# Patient Record
Sex: Female | Born: 1960
Health system: Southern US, Community
[De-identification: ages and names within clinical notes are randomized; demographics above are authoritative.]

## PROBLEM LIST (undated history)

## (undated) DIAGNOSIS — Z86711 Personal history of pulmonary embolism: Secondary | ICD-10-CM

## (undated) DIAGNOSIS — B009 Herpesviral infection, unspecified: Secondary | ICD-10-CM

## (undated) DIAGNOSIS — M810 Age-related osteoporosis without current pathological fracture: Secondary | ICD-10-CM

## (undated) DIAGNOSIS — Z8679 Personal history of other diseases of the circulatory system: Secondary | ICD-10-CM

## (undated) DIAGNOSIS — F1190 Opioid use, unspecified, uncomplicated: Secondary | ICD-10-CM

## (undated) DIAGNOSIS — I872 Venous insufficiency (chronic) (peripheral): Secondary | ICD-10-CM

## (undated) DIAGNOSIS — R5383 Other fatigue: Secondary | ICD-10-CM

## (undated) DIAGNOSIS — F32A Depression, unspecified: Secondary | ICD-10-CM

## (undated) DIAGNOSIS — E559 Vitamin D deficiency, unspecified: Secondary | ICD-10-CM

## (undated) DIAGNOSIS — I10 Essential (primary) hypertension: Secondary | ICD-10-CM

## (undated) DIAGNOSIS — F329 Major depressive disorder, single episode, unspecified: Secondary | ICD-10-CM

## (undated) DIAGNOSIS — G8929 Other chronic pain: Secondary | ICD-10-CM

## (undated) DIAGNOSIS — B001 Herpesviral vesicular dermatitis: Secondary | ICD-10-CM

## (undated) DIAGNOSIS — E876 Hypokalemia: Secondary | ICD-10-CM

## (undated) DIAGNOSIS — E063 Autoimmune thyroiditis: Secondary | ICD-10-CM

## (undated) DIAGNOSIS — F419 Anxiety disorder, unspecified: Secondary | ICD-10-CM

## (undated) DIAGNOSIS — G4733 Obstructive sleep apnea (adult) (pediatric): Secondary | ICD-10-CM

## (undated) DIAGNOSIS — G709 Myoneural disorder, unspecified: Secondary | ICD-10-CM

## (undated) DIAGNOSIS — L97919 Non-pressure chronic ulcer of unspecified part of right lower leg with unspecified severity: Secondary | ICD-10-CM

## (undated) DIAGNOSIS — Z7901 Long term (current) use of anticoagulants: Secondary | ICD-10-CM

## (undated) DIAGNOSIS — M797 Fibromyalgia: Secondary | ICD-10-CM

## (undated) DIAGNOSIS — Z9989 Dependence on other enabling machines and devices: Secondary | ICD-10-CM

## (undated) DIAGNOSIS — M329 Systemic lupus erythematosus, unspecified: Secondary | ICD-10-CM

## (undated) DIAGNOSIS — K5790 Diverticulosis of intestine, part unspecified, without perforation or abscess without bleeding: Secondary | ICD-10-CM

## (undated) DIAGNOSIS — L97929 Non-pressure chronic ulcer of unspecified part of left lower leg with unspecified severity: Secondary | ICD-10-CM

## (undated) HISTORY — DX: Systemic lupus erythematosus, unspecified: M32.9

## (undated) HISTORY — DX: Myoneural disorder, unspecified: G70.9

## (undated) HISTORY — DX: Other chronic pain: G89.29

## (undated) HISTORY — DX: Non-pressure chronic ulcer of unspecified part of left lower leg with unspecified severity: L97.929

## (undated) HISTORY — DX: Long term (current) use of anticoagulants: Z79.01

## (undated) HISTORY — DX: Herpesviral infection, unspecified: B00.9

## (undated) HISTORY — DX: Herpesviral vesicular dermatitis: B00.1

## (undated) HISTORY — DX: Obstructive sleep apnea (adult) (pediatric): G47.33

## (undated) HISTORY — DX: Age-related osteoporosis without current pathological fracture: M81.0

## (undated) HISTORY — DX: Depression, unspecified: F32.A

## (undated) HISTORY — PX: CHOLECYSTECTOMY: SHX55

## (undated) HISTORY — DX: Opioid use, unspecified, uncomplicated: F11.90

## (undated) HISTORY — DX: Dependence on other enabling machines and devices: Z99.89

## (undated) HISTORY — DX: Non-pressure chronic ulcer of unspecified part of right lower leg with unspecified severity: L97.919

## (undated) HISTORY — DX: Anxiety disorder, unspecified: F41.9

## (undated) HISTORY — DX: Fibromyalgia: M79.7

## (undated) HISTORY — DX: Diverticulosis of intestine, part unspecified, without perforation or abscess without bleeding: K57.90

## (undated) HISTORY — DX: Personal history of pulmonary embolism: Z86.711

## (undated) HISTORY — DX: Other fatigue: R53.83

## (undated) HISTORY — PX: LAPAROSCOPIC GASTRIC SLEEVE RESECTION: SHX5895

## (undated) HISTORY — DX: Venous insufficiency (chronic) (peripheral): I87.2

## (undated) HISTORY — DX: Hypokalemia: E87.6

## (undated) HISTORY — DX: Personal history of other diseases of the circulatory system: Z86.79

## (undated) HISTORY — DX: Vitamin D deficiency, unspecified: E55.9

## (undated) HISTORY — DX: Autoimmune thyroiditis: E06.3

## (undated) HISTORY — DX: Essential (primary) hypertension: I10

## (undated) HISTORY — DX: Major depressive disorder, single episode, unspecified: F32.9

---

## 2014-02-27 DIAGNOSIS — R141 Gas pain: Secondary | ICD-10-CM | POA: Insufficient documentation

## 2014-02-27 DIAGNOSIS — R142 Eructation: Secondary | ICD-10-CM

## 2014-02-27 DIAGNOSIS — E669 Obesity, unspecified: Secondary | ICD-10-CM | POA: Insufficient documentation

## 2014-02-27 DIAGNOSIS — R197 Diarrhea, unspecified: Secondary | ICD-10-CM | POA: Insufficient documentation

## 2014-02-27 DIAGNOSIS — R1011 Right upper quadrant pain: Secondary | ICD-10-CM | POA: Insufficient documentation

## 2014-02-27 DIAGNOSIS — I10 Essential (primary) hypertension: Secondary | ICD-10-CM | POA: Insufficient documentation

## 2014-02-27 DIAGNOSIS — R143 Flatulence: Secondary | ICD-10-CM

## 2015-02-03 DIAGNOSIS — L0390 Cellulitis, unspecified: Secondary | ICD-10-CM | POA: Insufficient documentation

## 2015-04-10 LAB — TSH: TSH: 2.89 u[IU]/mL (ref <=5.90)

## 2015-04-10 LAB — HEMOGLOBIN A1C: Hemoglobin A1C: 5.6

## 2015-08-06 DIAGNOSIS — Z6841 Body Mass Index (BMI) 40.0 and over, adult: Secondary | ICD-10-CM

## 2015-08-06 DIAGNOSIS — I2699 Other pulmonary embolism without acute cor pulmonale: Secondary | ICD-10-CM | POA: Insufficient documentation

## 2015-08-06 DIAGNOSIS — Z7952 Long term (current) use of systemic steroids: Secondary | ICD-10-CM | POA: Insufficient documentation

## 2015-08-06 DIAGNOSIS — E663 Overweight: Secondary | ICD-10-CM | POA: Insufficient documentation

## 2015-08-06 DIAGNOSIS — I209 Angina pectoris, unspecified: Secondary | ICD-10-CM | POA: Insufficient documentation

## 2015-08-06 DIAGNOSIS — Z8701 Personal history of pneumonia (recurrent): Secondary | ICD-10-CM | POA: Insufficient documentation

## 2015-08-06 DIAGNOSIS — D849 Immunodeficiency, unspecified: Secondary | ICD-10-CM | POA: Insufficient documentation

## 2015-08-06 DIAGNOSIS — L03119 Cellulitis of unspecified part of limb: Secondary | ICD-10-CM | POA: Insufficient documentation

## 2015-08-06 DIAGNOSIS — D899 Disorder involving the immune mechanism, unspecified: Secondary | ICD-10-CM

## 2015-08-06 DIAGNOSIS — R609 Edema, unspecified: Secondary | ICD-10-CM | POA: Insufficient documentation

## 2015-08-06 DIAGNOSIS — R590 Localized enlarged lymph nodes: Secondary | ICD-10-CM | POA: Insufficient documentation

## 2015-08-06 DIAGNOSIS — I2721 Secondary pulmonary arterial hypertension: Secondary | ICD-10-CM | POA: Insufficient documentation

## 2015-08-06 DIAGNOSIS — I10 Essential (primary) hypertension: Secondary | ICD-10-CM | POA: Insufficient documentation

## 2015-08-06 DIAGNOSIS — B009 Herpesviral infection, unspecified: Secondary | ICD-10-CM | POA: Insufficient documentation

## 2015-08-06 DIAGNOSIS — D72829 Elevated white blood cell count, unspecified: Secondary | ICD-10-CM | POA: Insufficient documentation

## 2015-08-06 DIAGNOSIS — Z7901 Long term (current) use of anticoagulants: Secondary | ICD-10-CM | POA: Insufficient documentation

## 2015-08-06 DIAGNOSIS — I272 Pulmonary hypertension, unspecified: Secondary | ICD-10-CM | POA: Insufficient documentation

## 2015-08-06 DIAGNOSIS — Z79899 Other long term (current) drug therapy: Secondary | ICD-10-CM | POA: Insufficient documentation

## 2015-08-06 DIAGNOSIS — F1120 Opioid dependence, uncomplicated: Secondary | ICD-10-CM | POA: Insufficient documentation

## 2015-10-07 LAB — CBC AND DIFFERENTIAL
HCT: 41 % (ref 36–46)
HEMOGLOBIN: 13.3 g/dL (ref 12.0–16.0)
PLATELETS: 357 10*3/uL (ref 150–399)
WBC: 8.3 10*3/mL

## 2015-10-07 LAB — POCT ERYTHROCYTE SEDIMENTATION RATE, NON-AUTOMATED: Sed Rate: 58 mm

## 2015-10-07 LAB — BASIC METABOLIC PANEL
BUN: 9 mg/dL (ref 4–21)
CREATININE: 0.9 mg/dL (ref 0.5–1.1)
Glucose: 112 mg/dL
Potassium: 3.5 mmol/L (ref 3.4–5.3)
SODIUM: 142 mmol/L (ref 137–147)

## 2015-10-07 LAB — HEPATIC FUNCTION PANEL
ALK PHOS: 100 U/L (ref 25–125)
ALT: 19 U/L (ref 7–35)
AST: 22 U/L (ref 13–35)
BILIRUBIN DIRECT: 0.1 mg/dL (ref 0.01–0.4)
BILIRUBIN, TOTAL: 0.6 mg/dL

## 2016-02-04 DIAGNOSIS — Z8679 Personal history of other diseases of the circulatory system: Secondary | ICD-10-CM | POA: Diagnosis not present

## 2016-02-04 DIAGNOSIS — F1190 Opioid use, unspecified, uncomplicated: Secondary | ICD-10-CM | POA: Insufficient documentation

## 2016-02-04 DIAGNOSIS — B001 Herpesviral vesicular dermatitis: Secondary | ICD-10-CM | POA: Insufficient documentation

## 2016-02-04 DIAGNOSIS — I10 Essential (primary) hypertension: Secondary | ICD-10-CM | POA: Insufficient documentation

## 2016-02-04 DIAGNOSIS — Z86711 Personal history of pulmonary embolism: Secondary | ICD-10-CM | POA: Diagnosis not present

## 2016-02-04 DIAGNOSIS — Z8739 Personal history of other diseases of the musculoskeletal system and connective tissue: Secondary | ICD-10-CM | POA: Diagnosis not present

## 2016-02-04 DIAGNOSIS — E559 Vitamin D deficiency, unspecified: Secondary | ICD-10-CM | POA: Diagnosis not present

## 2016-02-04 DIAGNOSIS — M329 Systemic lupus erythematosus, unspecified: Secondary | ICD-10-CM

## 2016-02-04 DIAGNOSIS — R5382 Chronic fatigue, unspecified: Secondary | ICD-10-CM | POA: Diagnosis not present

## 2016-02-04 DIAGNOSIS — R7309 Other abnormal glucose: Secondary | ICD-10-CM | POA: Insufficient documentation

## 2016-02-04 DIAGNOSIS — R5383 Other fatigue: Secondary | ICD-10-CM | POA: Insufficient documentation

## 2016-02-04 DIAGNOSIS — R809 Proteinuria, unspecified: Secondary | ICD-10-CM | POA: Diagnosis not present

## 2016-02-04 DIAGNOSIS — Z7901 Long term (current) use of anticoagulants: Secondary | ICD-10-CM | POA: Insufficient documentation

## 2016-02-04 DIAGNOSIS — IMO0002 Reserved for concepts with insufficient information to code with codable children: Secondary | ICD-10-CM | POA: Insufficient documentation

## 2016-02-04 DIAGNOSIS — B009 Herpesviral infection, unspecified: Secondary | ICD-10-CM | POA: Insufficient documentation

## 2016-02-06 DIAGNOSIS — Z8739 Personal history of other diseases of the musculoskeletal system and connective tissue: Secondary | ICD-10-CM | POA: Diagnosis not present

## 2016-02-10 ENCOUNTER — Encounter (INDEPENDENT_AMBULATORY_CARE_PROVIDER_SITE_OTHER): Payer: Self-pay

## 2016-02-23 ENCOUNTER — Other Ambulatory Visit: Payer: Self-pay | Admitting: Internal Medicine

## 2016-02-23 ENCOUNTER — Ambulatory Visit
Admission: RE | Admit: 2016-02-23 | Discharge: 2016-02-23 | Disposition: A | Discharge status: Home / Self Care | Payer: Commercial Managed Care - HMO | Source: Ambulatory Visit | Attending: Internal Medicine | Admitting: Internal Medicine

## 2016-02-23 DIAGNOSIS — M85632 Other cyst of bone, left forearm: Secondary | ICD-10-CM | POA: Diagnosis not present

## 2016-02-23 DIAGNOSIS — M19042 Primary osteoarthritis, left hand: Secondary | ICD-10-CM | POA: Diagnosis not present

## 2016-02-23 DIAGNOSIS — Z8739 Personal history of other diseases of the musculoskeletal system and connective tissue: Secondary | ICD-10-CM | POA: Insufficient documentation

## 2016-02-23 DIAGNOSIS — M85631 Other cyst of bone, right forearm: Secondary | ICD-10-CM | POA: Insufficient documentation

## 2016-02-23 DIAGNOSIS — M179 Osteoarthritis of knee, unspecified: Secondary | ICD-10-CM | POA: Diagnosis not present

## 2016-02-23 DIAGNOSIS — M329 Systemic lupus erythematosus, unspecified: Principal | ICD-10-CM

## 2016-02-23 DIAGNOSIS — M19041 Primary osteoarthritis, right hand: Secondary | ICD-10-CM | POA: Diagnosis not present

## 2016-02-23 DIAGNOSIS — IMO0002 Reserved for concepts with insufficient information to code with codable children: Secondary | ICD-10-CM

## 2016-03-12 DIAGNOSIS — I10 Essential (primary) hypertension: Secondary | ICD-10-CM

## 2016-03-12 DIAGNOSIS — E559 Vitamin D deficiency, unspecified: Secondary | ICD-10-CM

## 2016-03-12 DIAGNOSIS — F32A Depression, unspecified: Secondary | ICD-10-CM | POA: Insufficient documentation

## 2016-03-12 DIAGNOSIS — Z86711 Personal history of pulmonary embolism: Secondary | ICD-10-CM | POA: Insufficient documentation

## 2016-03-12 DIAGNOSIS — F419 Anxiety disorder, unspecified: Secondary | ICD-10-CM

## 2016-03-12 DIAGNOSIS — E063 Autoimmune thyroiditis: Secondary | ICD-10-CM | POA: Insufficient documentation

## 2016-03-12 DIAGNOSIS — F329 Major depressive disorder, single episode, unspecified: Secondary | ICD-10-CM | POA: Insufficient documentation

## 2016-03-12 DIAGNOSIS — K5790 Diverticulosis of intestine, part unspecified, without perforation or abscess without bleeding: Secondary | ICD-10-CM | POA: Insufficient documentation

## 2016-03-12 DIAGNOSIS — Z9989 Dependence on other enabling machines and devices: Secondary | ICD-10-CM

## 2016-03-12 DIAGNOSIS — M810 Age-related osteoporosis without current pathological fracture: Secondary | ICD-10-CM | POA: Insufficient documentation

## 2016-03-12 DIAGNOSIS — G709 Myoneural disorder, unspecified: Secondary | ICD-10-CM | POA: Insufficient documentation

## 2016-03-12 DIAGNOSIS — I129 Hypertensive chronic kidney disease with stage 1 through stage 4 chronic kidney disease, or unspecified chronic kidney disease: Secondary | ICD-10-CM | POA: Insufficient documentation

## 2016-03-12 DIAGNOSIS — E876 Hypokalemia: Secondary | ICD-10-CM | POA: Insufficient documentation

## 2016-03-12 DIAGNOSIS — G8929 Other chronic pain: Secondary | ICD-10-CM | POA: Insufficient documentation

## 2016-03-12 DIAGNOSIS — M797 Fibromyalgia: Secondary | ICD-10-CM | POA: Insufficient documentation

## 2016-03-12 DIAGNOSIS — G4733 Obstructive sleep apnea (adult) (pediatric): Secondary | ICD-10-CM | POA: Insufficient documentation

## 2016-03-12 DIAGNOSIS — M329 Systemic lupus erythematosus, unspecified: Secondary | ICD-10-CM | POA: Insufficient documentation

## 2016-04-19 ENCOUNTER — Ambulatory Visit (INDEPENDENT_AMBULATORY_CARE_PROVIDER_SITE_OTHER): Payer: Medicare PPO | Admitting: Family Medicine

## 2016-04-19 ENCOUNTER — Encounter: Payer: Self-pay | Admitting: Family Medicine

## 2016-04-19 VITALS — BP 131/80 | HR 80 | Temp 98.4°F | Resp 17 | Ht 63.0 in | Wt 344.0 lb

## 2016-04-19 DIAGNOSIS — M810 Age-related osteoporosis without current pathological fracture: Secondary | ICD-10-CM | POA: Diagnosis not present

## 2016-04-19 DIAGNOSIS — Z9989 Dependence on other enabling machines and devices: Secondary | ICD-10-CM

## 2016-04-19 DIAGNOSIS — Z1231 Encounter for screening mammogram for malignant neoplasm of breast: Secondary | ICD-10-CM | POA: Diagnosis not present

## 2016-04-19 DIAGNOSIS — Z86711 Personal history of pulmonary embolism: Secondary | ICD-10-CM

## 2016-04-19 DIAGNOSIS — I10 Essential (primary) hypertension: Secondary | ICD-10-CM

## 2016-04-19 DIAGNOSIS — R05 Cough: Secondary | ICD-10-CM

## 2016-04-19 DIAGNOSIS — E559 Vitamin D deficiency, unspecified: Secondary | ICD-10-CM

## 2016-04-19 DIAGNOSIS — M329 Systemic lupus erythematosus, unspecified: Secondary | ICD-10-CM

## 2016-04-19 DIAGNOSIS — R059 Cough, unspecified: Secondary | ICD-10-CM

## 2016-04-19 DIAGNOSIS — N393 Stress incontinence (female) (male): Secondary | ICD-10-CM

## 2016-04-19 DIAGNOSIS — E876 Hypokalemia: Secondary | ICD-10-CM | POA: Diagnosis not present

## 2016-04-19 DIAGNOSIS — F419 Anxiety disorder, unspecified: Secondary | ICD-10-CM

## 2016-04-19 DIAGNOSIS — G709 Myoneural disorder, unspecified: Secondary | ICD-10-CM

## 2016-04-19 DIAGNOSIS — E039 Hypothyroidism, unspecified: Secondary | ICD-10-CM | POA: Diagnosis not present

## 2016-04-19 DIAGNOSIS — M797 Fibromyalgia: Secondary | ICD-10-CM

## 2016-04-19 DIAGNOSIS — Z1239 Encounter for other screening for malignant neoplasm of breast: Secondary | ICD-10-CM

## 2016-04-19 DIAGNOSIS — Z1211 Encounter for screening for malignant neoplasm of colon: Secondary | ICD-10-CM | POA: Diagnosis not present

## 2016-04-19 DIAGNOSIS — F331 Major depressive disorder, recurrent, moderate: Secondary | ICD-10-CM

## 2016-04-19 DIAGNOSIS — E063 Autoimmune thyroiditis: Secondary | ICD-10-CM

## 2016-04-19 DIAGNOSIS — Z780 Asymptomatic menopausal state: Secondary | ICD-10-CM

## 2016-04-19 DIAGNOSIS — G4733 Obstructive sleep apnea (adult) (pediatric): Secondary | ICD-10-CM

## 2016-04-19 DIAGNOSIS — Z13220 Encounter for screening for lipoid disorders: Secondary | ICD-10-CM

## 2016-04-19 MED ORDER — PANTOPRAZOLE SODIUM 40 MG PO TBEC: 40.0000 mg | DELAYED_RELEASE_TABLET | Freq: Every day | ORAL | 1 refills | Status: DC

## 2016-04-19 MED ORDER — GABAPENTIN 300 MG PO CAPS: 600.0000 mg | ORAL_CAPSULE | Freq: Two times a day (BID) | ORAL | 1 refills | Status: DC

## 2016-04-19 MED ORDER — BACLOFEN 10 MG PO TABS: 10.0000 mg | ORAL_TABLET | Freq: Every day | ORAL | 1 refills | Status: DC | PRN

## 2016-04-19 MED ORDER — VENLAFAXINE HCL ER 75 MG PO CP24: 75.0000 mg | ORAL_CAPSULE | Freq: Every day | ORAL | 1 refills | Status: DC

## 2016-04-19 MED ORDER — AMLODIPINE BESYLATE 10 MG PO TABS: 10.0000 mg | ORAL_TABLET | Freq: Every day | ORAL | 1 refills | Status: DC

## 2016-04-19 MED ORDER — APIXABAN 5 MG PO TABS: 5.0000 mg | ORAL_TABLET | Freq: Two times a day (BID) | ORAL | 1 refills | Status: DC

## 2016-04-19 MED ORDER — FUROSEMIDE 40 MG PO TABS: 40.0000 mg | ORAL_TABLET | Freq: Two times a day (BID) | ORAL | 1 refills | Status: DC

## 2016-04-19 MED ORDER — GABAPENTIN 300 MG PO CAPS: ORAL_CAPSULE | ORAL | 1 refills | Status: DC

## 2016-04-19 NOTE — Assessment & Plan Note (Signed)
Under good control. Continue current regimen. Continue to monitor. Recheck 6 months.  

## 2016-04-19 NOTE — Assessment & Plan Note (Signed)
Continue CPAP. Call with any concerns.  

## 2016-04-19 NOTE — Assessment & Plan Note (Signed)
Rechecking levels. Treat as needed.

## 2016-04-19 NOTE — Progress Notes (Signed)
BP 131/80 (BP Location: Left Arm, Patient Position: Sitting, Cuff Size: Normal)   Pulse 80   Temp 98.4 F (36.9 C) (Oral)   Resp 17   Ht 5\' 3"  (1.6 m)   Wt (!) 344 lb (156 kg)   SpO2 96%   BMI 60.94 kg/m    Subjective:    Patient ID: Kaylee Fox, female    DOB: 11-15-1960, 56 y.o.   MRN: [id number]  HPI: Kaylee Fox is a 56 y.o. female  Chief Complaint  Patient presents with  . Establish Care  . Medication Refill    Effexor, amlodipine, lasix, eliquis, gabpentin, baclofen, thyroid   COUGH Duration: 2 months Circumstances of initial development of cough: URI Cough severity: moderate Cough description: productive and hacking Aggravating factors:  nothing Alleviating factors: nothing Status:  better Treatments attempted: cold/sinus Wheezing: no Shortness of breath: yes Chest pain: no Chest tightness:no Nasal congestion: yes Runny nose: no Postnasal drip: yes Frequent throat clearing or swallowing: no Hemoptysis: no Fevers: no Night sweats: yes Weight loss: no Heartburn: yes Recent foreign travel: no Tuberculosis contacts: no   MENOPAUSAL SYMPTOMS- hasn't gotten her period in about a year and a half, has been having night sweats. Having trouble sleeping Duration: a couple of months Symptom severity: fluctuating Hot flashes: yes Night sweats: yes Sleep disturbances: yes Vaginal dryness: yes Dyspareunia:no Decreased libido: no Emotional lability: yes Stress incontinence: yes Previous HRT/pharmacotherapy: no Hysterectomy: no Absolute Contraindications to Hormonal Therapy:     Undiagnosed vaginal bleeding: no    Breast cancer: yes- family history    Endometrial cancer: no- - family history    Coronary disease: yes- - family history    Cerebrovascular disease: no    Venous thromboembolic disease: yes  HYPERTENSION Hypertension status: controlled  Satisfied with current treatment? yes Duration of hypertension: chronic BP monitoring frequency:  not  checking BP medication side effects:  no Medication compliance: excellent compliance Previous BP meds: amlodipine Aspirin: no Recurrent headaches: yes Visual changes: no Palpitations: yes Dyspnea: yes Chest pain: no Lower extremity edema: yes Dizzy/lightheaded: no   HYPOTHYROIDISM Thyroid control status:uncontrolled Satisfied with current treatment? no Medication side effects: no Medication compliance: excellent compliance Recent dose adjustment:no Fatigue: yes Cold intolerance: no Heat intolerance: yes Weight gain: no Weight loss: no Constipation: no Diarrhea/loose stools: yes Palpitations: yes Lower extremity edema: yes Anxiety/depressed mood: yes   Active Ambulatory Problems    Diagnosis Date Noted  . Depression   . Anxiety   . Hypertension   . Osteoporosis   . Vitamin D deficiency   . Fibromyalgia   . History of pulmonary embolus (PE)   . Lupus (systemic lupus erythematosus) (HCC)   . Chronic hypokalemia   . Autoimmune hypothyroidism   . Neuromuscular disorder (HCC)   . OSA on CPAP   . Diverticulosis   . Chronic pain   . Menopause 04/19/2016   Resolved Ambulatory Problems    Diagnosis Date Noted  . No Resolved Ambulatory Problems   Past Medical History:  Diagnosis Date  . Anticoagulant long-term use   . Anxiety   . Autoimmune hypothyroidism   . Chronic hypokalemia   . Chronic pain   . Chronic, continuous use of opioids   . Depression   . Diverticulosis   . Fatigue   . Fibromyalgia   . Herpes labialis   . Herpes simplex   . History of pulmonary embolus (PE)   . History of pulmonary hypertension   . Hypertension   . Lupus (  systemic lupus erythematosus) (HCC)   . Neuromuscular disorder (HCC)   . OSA on CPAP   . Osteoporosis   . Vitamin D deficiency    Past Surgical History:  Procedure Laterality Date  . CESAREAN SECTION    . CHOLECYSTECTOMY     Outpatient Encounter Prescriptions as of 04/19/2016  Medication Sig  . acetaminophen  (TYLENOL) 325 MG tablet Take by mouth.  Marland Kitchen. amLODipine (NORVASC) 10 MG tablet Take 1 tablet (10 mg total) by mouth daily.  Marland Kitchen. apixaban (ELIQUIS) 5 MG TABS tablet Take 1 tablet (5 mg total) by mouth 2 (two) times daily.  . baclofen (LIORESAL) 10 MG tablet Take 1 tablet (10 mg total) by mouth daily as needed for muscle spasms.  . calcium carbonate (CALCIUM 600) 600 MG TABS tablet Take by mouth.  . Cholecalciferol (D3-1000) 1000 units capsule Take 1,000 Units by mouth daily. Takes 2 1000 mg per day  . furosemide (LASIX) 40 MG tablet Take 1 tablet (40 mg total) by mouth 2 (two) times daily.  Marland Kitchen. gabapentin (NEURONTIN) 300 MG capsule Take 2 capsules (600 mg total) by mouth 2 (two) times daily.  . hydroxychloroquine (PLAQUENIL) 200 MG tablet Take 200 mg by mouth. Take 3 200 mg tabs a day  . hydrOXYzine (ATARAX/VISTARIL) 25 MG tablet Take by mouth.  . levothyroxine (SYNTHROID, LEVOTHROID) 50 MCG tablet Take by mouth.  . Multiple Vitamin (MULTI-VITAMINS) TABS Take by mouth.  . mupirocin cream (BACTROBAN) 2 % Apply to affected area on arms/legs twice a day as needed.  . pantoprazole (PROTONIX) 40 MG tablet Take 1 tablet (40 mg total) by mouth daily.  . potassium chloride (KLOR-CON) 20 MEQ packet Take by mouth.  . predniSONE (DELTASONE) 5 MG tablet Take by mouth.  . Vitamin D, Ergocalciferol, (DRISDOL) 50000 units CAPS capsule Take 1 capsule every week for 16 weeks.  . [DISCONTINUED] amLODipine (NORVASC) 10 MG tablet Take by mouth.  . [DISCONTINUED] apixaban (ELIQUIS) 5 MG TABS tablet Take by mouth.  . [DISCONTINUED] baclofen (LIORESAL) 10 MG tablet Take by mouth.  . [DISCONTINUED] furosemide (LASIX) 40 MG tablet Take by mouth.  . [DISCONTINUED] gabapentin (NEURONTIN) 300 MG capsule Take by mouth.  . [DISCONTINUED] gabapentin (NEURONTIN) 300 MG capsule 600mg  qAM and 300mg  qHS  . [DISCONTINUED] pantoprazole (PROTONIX) 40 MG tablet Take by mouth.  . [DISCONTINUED] venlafaxine (EFFEXOR) 37.5 MG tablet Take by  mouth.  . venlafaxine XR (EFFEXOR XR) 75 MG 24 hr capsule Take 1 capsule (75 mg total) by mouth daily with breakfast.  . [DISCONTINUED] amLODipine (NORVASC) 2.5 MG tablet Take by mouth.   No facility-administered encounter medications on file as of 04/19/2016.    Allergies  Allergen Reactions  . Penicillins Rash  . Ciprofloxacin Other (See Comments)  . Morphine Other (See Comments)   Social History   Social History  . Marital status: Legally Separated    Spouse name: N/A  . Number of children: N/A  . Years of education: N/A   Occupational History  . Not on file.   Social History Main Topics  . Smoking status: Never Smoker  . Smokeless tobacco: Never Used  . Alcohol use No  . Drug use: No  . Sexual activity: Not on file   Other Topics Concern  . Not on file   Social History Narrative  . No narrative on file   Family History  Problem Relation Age of Onset  . Arthritis Mother   . Osteoarthritis Mother   . Lupus Mother   .  Arthritis Father   . Osteoarthritis Father   . Lupus Father     Review of Systems  Constitutional: Negative.   HENT: Positive for congestion and postnasal drip. Negative for dental problem, drooling, ear discharge, ear pain, facial swelling, hearing loss, mouth sores, nosebleeds, rhinorrhea, sinus pain, sinus pressure, sneezing, sore throat, tinnitus, trouble swallowing and voice change.   Respiratory: Positive for cough. Negative for apnea, choking, chest tightness, shortness of breath, wheezing and stridor.   Cardiovascular: Positive for palpitations and leg swelling. Negative for chest pain.  Endocrine: Positive for heat intolerance. Negative for cold intolerance, polydipsia, polyphagia and polyuria.  Genitourinary: Positive for urgency. Negative for decreased urine volume, difficulty urinating, dyspareunia, dysuria, enuresis, flank pain, frequency, genital sores, hematuria, menstrual problem, pelvic pain, vaginal bleeding, vaginal discharge and  vaginal pain.  Musculoskeletal: Positive for arthralgias, joint swelling and myalgias. Negative for back pain, gait problem, neck pain and neck stiffness.  Psychiatric/Behavioral: Positive for agitation, dysphoric mood and sleep disturbance. Negative for behavioral problems, confusion, decreased concentration, hallucinations, self-injury and suicidal ideas. The patient is nervous/anxious. The patient is not hyperactive.     Per HPI unless specifically indicated above     Objective:    BP 131/80 (BP Location: Left Arm, Patient Position: Sitting, Cuff Size: Normal)   Pulse 80   Temp 98.4 F (36.9 C) (Oral)   Resp 17   Ht 5\' 3"  (1.6 m)   Wt (!) 344 lb (156 kg)   SpO2 96%   BMI 60.94 kg/m   Wt Readings from Last 3 Encounters:  04/19/16 (!) 344 lb (156 kg)    Physical Exam  Constitutional: She is oriented to person, place, and time. She appears well-developed and well-nourished. No distress.  HENT:  Head: Normocephalic and atraumatic.  Right Ear: Hearing normal.  Left Ear: Hearing normal.  Nose: Nose normal.  Eyes: Conjunctivae and lids are normal. Right eye exhibits no discharge. Left eye exhibits no discharge. No scleral icterus.  Cardiovascular: Normal rate, regular rhythm, normal heart sounds and intact distal pulses.  Exam reveals no gallop and no friction rub.   No murmur heard. Pulmonary/Chest: Effort normal and breath sounds normal. No respiratory distress. She has no wheezes. She has no rales. She exhibits no tenderness.  Musculoskeletal: Normal range of motion.  Neurological: She is alert and oriented to person, place, and time.  Skin: Skin is warm, dry and intact. No rash noted. She is not diaphoretic. No erythema.  Psychiatric: She has a normal mood and affect. Her speech is normal and behavior is normal. Judgment and thought content normal. Cognition and memory are normal.  Nursing note and vitals reviewed.   Results for orders placed or performed in visit on 04/19/16   CBC and differential  Result Value Ref Range   Hemoglobin 13.3 12.0 - 16.0 g/dL   HCT 41 36 - 46 %   Platelets 357 150 - 399 K/L   WBC 8.3 10^3/mL  Basic metabolic panel  Result Value Ref Range   Glucose 112 mg/dL   BUN 9 4 - 21 mg/dL   Creatinine 0.9 0.5 - 1.1 mg/dL   Potassium 3.5 3.4 - 5.3 mmol/L   Sodium 142 137 - 147 mmol/L  Hepatic function panel  Result Value Ref Range   Alkaline Phosphatase 100 25 - 125 U/L   ALT 19 7 - 35 U/L   AST 22 13 - 35 U/L   Bilirubin, Direct 0.1 0.01 - 0.4 mg/dL   Bilirubin, Total 0.6  mg/dL  Hemoglobin Z6X  Result Value Ref Range   Hemoglobin A1C 5.6   TSH  Result Value Ref Range   TSH 2.89 0.41 - 5.90 uIU/mL  POCT erythrocyte sed rate, Non-automated  Result Value Ref Range   Sed Rate 58 mm      Assessment & Plan:   Problem List Items Addressed This Visit      Cardiovascular and Mediastinum   Hypertension - Primary    Under good control. Continue current regimen. Continue to monitor. Recheck 6 months.       Relevant Medications   furosemide (LASIX) 40 MG tablet   apixaban (ELIQUIS) 5 MG TABS tablet   amLODipine (NORVASC) 10 MG tablet   Other Relevant Orders   Comprehensive metabolic panel   Microalbumin, Urine Waived     Respiratory   OSA on CPAP    Continue CPAP. Call with any concerns.         Endocrine   Autoimmune hypothyroidism    Rechecking levels. Will adjust dose as needed. Call with any concerns.       Relevant Medications   levothyroxine (SYNTHROID, LEVOTHROID) 50 MCG tablet   Other Relevant Orders   Comprehensive metabolic panel   TSH     Nervous and Auditory   Neuromuscular disorder (HCC)    Continue to follow with rheumatology. Call with any concerns.       Relevant Medications   hydrOXYzine (ATARAX/VISTARIL) 25 MG tablet   venlafaxine XR (EFFEXOR XR) 75 MG 24 hr capsule   baclofen (LIORESAL) 10 MG tablet   gabapentin (NEURONTIN) 300 MG capsule     Musculoskeletal and Integument    Osteoporosis    DEXA being done by rheumatology. Continue weight-bearing exercise and calcium-vit D      Relevant Medications   calcium carbonate (CALCIUM 600) 600 MG TABS tablet   Vitamin D, Ergocalciferol, (DRISDOL) 50000 units CAPS capsule   Cholecalciferol (D3-1000) 1000 units capsule   Other Relevant Orders   CBC with Differential/Platelet   Comprehensive metabolic panel   VITAMIN D 25 Hydroxy (Vit-D Deficiency, Fractures)     Other   Depression    Exacerbated with menopause. Will increase her effexor. Call with any concerns. Recheck 1 month.       Relevant Medications   hydrOXYzine (ATARAX/VISTARIL) 25 MG tablet   venlafaxine XR (EFFEXOR XR) 75 MG 24 hr capsule   Other Relevant Orders   CBC with Differential/Platelet   Comprehensive metabolic panel   Anxiety    Exacerbated with menopause. Will increase effexor. Recheck 1 month.       Relevant Medications   hydrOXYzine (ATARAX/VISTARIL) 25 MG tablet   venlafaxine XR (EFFEXOR XR) 75 MG 24 hr capsule   Vitamin D deficiency    Rechecking levels. Will replace as needed.       Relevant Orders   Comprehensive metabolic panel   VITAMIN D 25 Hydroxy (Vit-D Deficiency, Fractures)   Fibromyalgia    Continue gabapentin. Call with any concerns.       Relevant Orders   Comprehensive metabolic panel   History of pulmonary embolus (PE)    Continue eliquis. Refill given today.      Lupus (systemic lupus erythematosus) (HCC)    Continue to follow with rheumatology. Call with any concerns.       Chronic hypokalemia    Rechecking levels. Treat as needed.       Relevant Orders   Comprehensive metabolic panel   Menopause    Not a  candidate for hormones due to recurrent PEs. Will try herbal remedies. Will increase her effexor to 75mg  and recheck 1 month. Increase gabapentin to 600mg  qHS to help with vasomotor symptoms       Other Visit Diagnoses    Screening for breast cancer       Mammogram ordered today.   Relevant  Orders   MM DIGITAL SCREENING BILATERAL   Screening for colon cancer       Referral for colonoscopy ordered today.   Relevant Orders   Ambulatory referral to Gastroenterology   Comprehensive metabolic panel   Screening for cholesterol level       Labs drawn today.   Relevant Orders   Lipid Panel w/o Chol/HDL Ratio   Morbid obesity (HCC)       Continue to work on diet and exercise. Checking labs today.   Relevant Orders   Comprehensive metabolic panel   Hgb A1c w/o eAG   Stress incontinence       UA checked today.   Relevant Orders   UA/M w/rflx Culture, Routine   Cough       Likely due to her allergies. If not improving, will start flonase.        Follow up plan: Return in about 4 weeks (around 05/17/2016) for Follow up mood and menopause.

## 2016-04-19 NOTE — Assessment & Plan Note (Signed)
Rechecking levels. Will adjust dose as needed. Call with any concerns.  

## 2016-04-19 NOTE — Patient Instructions (Addendum)
Menopause and Herbal Products Introduction WHAT IS MENOPAUSE? Menopause is the normal time of life when menstrual periods decrease in frequency and eventually stop completely. This process can take several years for some women. Menopause is complete when you have had an absence of menstruation for a full year since your last menstrual period. It usually occurs between the ages of 3848 and 6155. It is not common for menopause to begin before the age of 55. During menopause, your body stops producing the female hormones estrogen and progesterone. Common symptoms associated with this loss of hormones (vasomotor symptoms) are:  Hot flashes.  Hot flushes.  Night sweats. Other common symptoms and complications of menopause include:  Decrease in sex drive.  Vaginal dryness and thinning of the walls of the vagina. This can make sex painful.  Dryness of the skin and development of wrinkles.  Headaches.  Tiredness.  Irritability.  Memory problems.  Weight gain.  Bladder infections.  Hair growth on the face and chest.  Inability to reproduce offspring (infertility).  Loss of density in the bones (osteoporosis) increasing your risk for breaks (fractures).  Depression.  Hardening and narrowing of the arteries (atherosclerosis). This increases your risk of heart attack and stroke. WHAT TREATMENT OPTIONS ARE AVAILABLE? There are many treatment choices for menopause symptoms. The most common treatment is hormone replacement therapy. Many alternative therapies for menopause are emerging, including the use of herbal products. These supplements can be found in the form of herbs, teas, oils, tinctures, and pills. Common herbal supplements for menopause are made from plants that contain phytoestrogens. Phytoestrogens are compounds that occur naturally in plants and plant products. They act like estrogen in the body. Foods and herbs that contain phytoestrogens include:  Soy.  Flax seeds.  Red  clover.  Ginseng. WHAT MENOPAUSE SYMPTOMS MAY BE HELPED IF I USE HERBAL PRODUCTS?  Vasomotor symptoms. These may be helped by:  Soy. Some studies show that soy may have a moderate benefit for hot flashes.  Black cohosh. There is limited evidence indicating this may be beneficial for hot flashes.  Evening Primrose Oil  Symptoms that are related to heart and blood vessel disease. These may be helped by soy. Studies have shown that soy can help to lower cholesterol.  Depression. This may be helped by:  St. John's wort. There is limited evidence that shows this may help mild to moderate depression.  Black cohosh. There is evidence that this may help depression and mood swings.  Osteoporosis. Soy may help to decrease bone loss that is associated with menopause and may prevent osteoporosis. Limited evidence indicates that red clover may offer some bone loss protection as well. Other herbal products that are commonly used during menopause lack enough evidence to support their use as a replacement for conventional menopause therapies. These products include evening primrose, ginseng, and red clover. WHAT ARE THE CASES WHEN HERBAL PRODUCTS SHOULD NOT BE USED DURING MENOPAUSE? Do not use herbal products during menopause without your health care provider's approval if:  You are taking medicine.  You have a preexisting liver condition. ARE THERE ANY RISKS IN MY TAKING HERBAL PRODUCTS DURING MENOPAUSE? If you choose to use herbal products to help with symptoms of menopause, keep in mind that:  Different supplements have different and unmeasured amounts of herbal ingredients.  Herbal products are not regulated the same way that medicines are.  Concentrations of herbs may vary depending on the way they are prepared. For example, the concentration may be different in a  pill, tea, oil, and tincture.  Little is known about the risks of using herbal products, particularly the risks of long-term  use.  Some herbal supplements can be harmful when combined with certain medicines. Most commonly reported side effects of herbal products are mild. However, if used improperly, many herbal supplements can cause serious problems. Talk to your health care provider before starting any herbal product. If problems develop, stop taking the supplement and let your health care provider know. This information is not intended to replace advice given to you by your health care provider. Make sure you discuss any questions you have with your health care provider. Document Released: 08/11/2007 Document Revised: 07/31/2015 Document Reviewed: 08/07/2013  2017 Elsevier

## 2016-04-19 NOTE — Assessment & Plan Note (Signed)
DEXA being done by rheumatology. Continue weight-bearing exercise and calcium-vit D

## 2016-04-19 NOTE — Assessment & Plan Note (Signed)
Exacerbated with menopause. Will increase effexor. Recheck 1 month.

## 2016-04-19 NOTE — Assessment & Plan Note (Signed)
Continue eliquis. Refill given today.

## 2016-04-19 NOTE — Assessment & Plan Note (Signed)
Rechecking levels. Will replace as needed.

## 2016-04-19 NOTE — Assessment & Plan Note (Signed)
Continue gabapentin. Call with any concerns.

## 2016-04-19 NOTE — Assessment & Plan Note (Signed)
Not a candidate for hormones due to recurrent PEs. Will try herbal remedies. Will increase her effexor to 75mg  and recheck 1 month. Increase gabapentin to 600mg  qHS to help with vasomotor symptoms

## 2016-04-19 NOTE — Assessment & Plan Note (Signed)
Exacerbated with menopause. Will increase her effexor. Call with any concerns. Recheck 1 month.

## 2016-04-19 NOTE — Assessment & Plan Note (Signed)
Continue to follow with rheumatology. Call with any concerns.  

## 2016-04-20 ENCOUNTER — Encounter: Payer: Self-pay | Admitting: Family Medicine

## 2016-04-20 LAB — COMPREHENSIVE METABOLIC PANEL
A/G RATIO: 1.2 (ref 1.2–2.2)
ALBUMIN: 4.1 g/dL (ref 3.5–5.5)
ALK PHOS: 92 IU/L (ref 39–117)
ALT: 17 IU/L (ref 0–32)
AST: 21 IU/L (ref 0–40)
BUN / CREAT RATIO: 20 (ref 9–23)
BUN: 14 mg/dL (ref 6–24)
Bilirubin Total: 0.3 mg/dL (ref 0.0–1.2)
CALCIUM: 9.6 mg/dL (ref 8.7–10.2)
CO2: 25 mmol/L (ref 18–29)
CREATININE: 0.71 mg/dL (ref 0.57–1.00)
Chloride: 102 mmol/L (ref 96–106)
GFR calc Af Amer: 112 mL/min/{1.73_m2} (ref >59)
GFR, EST NON AFRICAN AMERICAN: 97 mL/min/{1.73_m2} (ref >59)
GLOBULIN, TOTAL: 3.5 g/dL (ref 1.5–4.5)
Glucose: 96 mg/dL (ref 65–99)
POTASSIUM: 4.3 mmol/L (ref 3.5–5.2)
SODIUM: 144 mmol/L (ref 134–144)
Total Protein: 7.6 g/dL (ref 6.0–8.5)

## 2016-04-20 LAB — UA/M W/RFLX CULTURE, ROUTINE
Bilirubin, UA: NEGATIVE
GLUCOSE, UA: NEGATIVE
Ketones, UA: NEGATIVE
LEUKOCYTES UA: NEGATIVE
Nitrite, UA: NEGATIVE
RBC, UA: NEGATIVE
Specific Gravity, UA: 1.025 (ref 1.005–1.030)
Urobilinogen, Ur: 0.2 mg/dL (ref 0.2–1.0)
pH, UA: 5.5 (ref 5.0–7.5)

## 2016-04-20 LAB — CBC WITH DIFFERENTIAL/PLATELET
BASOS: 1 %
Basophils Absolute: 0.1 10*3/uL (ref 0.0–0.2)
EOS (ABSOLUTE): 0.1 10*3/uL (ref 0.0–0.4)
EOS: 1 %
HEMATOCRIT: 39.7 % (ref 34.0–46.6)
HEMOGLOBIN: 12.9 g/dL (ref 11.1–15.9)
Immature Grans (Abs): 0.1 10*3/uL (ref 0.0–0.1)
Immature Granulocytes: 1 %
LYMPHS ABS: 2.1 10*3/uL (ref 0.7–3.1)
Lymphs: 26 %
MCH: 30.7 pg (ref 26.6–33.0)
MCHC: 32.5 g/dL (ref 31.5–35.7)
MCV: 95 fL (ref 79–97)
MONOCYTES: 5 %
MONOS ABS: 0.4 10*3/uL (ref 0.1–0.9)
Neutrophils Absolute: 5.6 10*3/uL (ref 1.4–7.0)
Neutrophils: 66 %
Platelets: 450 10*3/uL — ABNORMAL HIGH (ref 150–379)
RBC: 4.2 x10E6/uL (ref 3.77–5.28)
RDW: 13.8 % (ref 12.3–15.4)
WBC: 8.3 10*3/uL (ref 3.4–10.8)

## 2016-04-20 LAB — LIPID PANEL W/O CHOL/HDL RATIO
Cholesterol, Total: 219 mg/dL — ABNORMAL HIGH (ref 100–199)
HDL: 54 mg/dL (ref >39)
LDL Calculated: 139 mg/dL — ABNORMAL HIGH (ref 0–99)
Triglycerides: 128 mg/dL (ref 0–149)
VLDL CHOLESTEROL CAL: 26 mg/dL (ref 5–40)

## 2016-04-20 LAB — MICROALBUMIN, URINE WAIVED
CREATININE, URINE WAIVED: 200 mg/dL (ref 10–300)
Microalb, Ur Waived: 80 mg/L — ABNORMAL HIGH (ref 0–19)

## 2016-04-20 LAB — TSH: TSH: 4.54 u[IU]/mL — AB (ref 0.450–4.500)

## 2016-04-20 LAB — HGB A1C W/O EAG: Hgb A1c MFr Bld: 5.4 % (ref 4.8–5.6)

## 2016-04-20 LAB — VITAMIN D 25 HYDROXY (VIT D DEFICIENCY, FRACTURES): Vit D, 25-Hydroxy: 35.7 ng/mL (ref 30.0–100.0)

## 2016-04-22 ENCOUNTER — Telehealth: Payer: Self-pay | Admitting: Family Medicine

## 2016-04-22 DIAGNOSIS — E039 Hypothyroidism, unspecified: Secondary | ICD-10-CM

## 2016-04-22 MED ORDER — LEVOTHYROXINE SODIUM 75 MCG PO TABS: 75.0000 ug | ORAL_TABLET | Freq: Every day | ORAL | 1 refills | Status: DC

## 2016-04-22 NOTE — Telephone Encounter (Signed)
Called Kaylee Fox with her results. Will increase her thyroid medicine to and recheck in 6 weeks.

## 2016-05-03 DIAGNOSIS — M3501 Sicca syndrome with keratoconjunctivitis: Secondary | ICD-10-CM | POA: Diagnosis not present

## 2016-05-03 DIAGNOSIS — Z79899 Other long term (current) drug therapy: Secondary | ICD-10-CM | POA: Diagnosis not present

## 2016-05-06 DIAGNOSIS — Z86711 Personal history of pulmonary embolism: Secondary | ICD-10-CM | POA: Diagnosis not present

## 2016-05-06 DIAGNOSIS — Z8679 Personal history of other diseases of the circulatory system: Secondary | ICD-10-CM | POA: Diagnosis not present

## 2016-05-06 DIAGNOSIS — Z79899 Other long term (current) drug therapy: Secondary | ICD-10-CM | POA: Diagnosis not present

## 2016-05-06 DIAGNOSIS — M329 Systemic lupus erythematosus, unspecified: Secondary | ICD-10-CM | POA: Diagnosis not present

## 2016-05-07 DIAGNOSIS — M329 Systemic lupus erythematosus, unspecified: Secondary | ICD-10-CM | POA: Diagnosis not present

## 2016-05-10 ENCOUNTER — Other Ambulatory Visit: Payer: Self-pay

## 2016-05-10 ENCOUNTER — Telehealth: Payer: Self-pay

## 2016-05-10 DIAGNOSIS — Z1211 Encounter for screening for malignant neoplasm of colon: Secondary | ICD-10-CM

## 2016-05-10 NOTE — Telephone Encounter (Signed)
Gastroenterology Pre-Procedure Review  Request Date: 06/14/16 Requesting Physician: Dr. Servando SnareWohl  PATIENT REVIEW QUESTIONS: The patient responded to the following health history questions as indicated:    1. Are you having any GI issues? no 2. Do you have a personal history of Polyps? no 3. Do you have a family history of Colon Cancer or Polyps? yes (mother (intestinal rupture), father (GI bleed)) 4. Diabetes Mellitus? no 5. Joint replacements in the past 12 months?no 6. Major health problems in the past 3 months?no 7. Any artificial heart valves, MVP, or defibrillator?no    MEDICATIONS & ALLERGIES:    Patient reports the following regarding taking any anticoagulation/antiplatelet therapy:   Plavix, Coumadin, Eliquis, Xarelto, Lovenox, Pradaxa, Brilinta, or Effient? yes (Eliquis (due to Lupus: Dr. Olevia PerchesMegan Johnson)) Aspirin? no  Patient confirms/reports the following medications:  Current Outpatient Prescriptions  Medication Sig Dispense Refill  . acetaminophen (TYLENOL) 325 MG tablet Take by mouth.    Marland Kitchen. amLODipine (NORVASC) 10 MG tablet Take 1 tablet (10 mg total) by mouth daily. 90 tablet 1  . apixaban (ELIQUIS) 5 MG TABS tablet Take 1 tablet (5 mg total) by mouth 2 (two) times daily. 180 tablet 1  . baclofen (LIORESAL) 10 MG tablet Take 1 tablet (10 mg total) by mouth daily as needed for muscle spasms. 90 tablet 1  . calcium carbonate (CALCIUM 600) 600 MG TABS tablet Take by mouth.    . Cholecalciferol (D3-1000) 1000 units capsule Take 1,000 Units by mouth daily. Takes 2 1000 mg per day    . furosemide (LASIX) 40 MG tablet Take 1 tablet (40 mg total) by mouth 2 (two) times daily. 180 tablet 1  . gabapentin (NEURONTIN) 300 MG capsule Take 2 capsules (600 mg total) by mouth 2 (two) times daily. 360 capsule 1  . hydroxychloroquine (PLAQUENIL) 200 MG tablet Take 200 mg by mouth. Take 3 200 mg tabs a day    . hydrOXYzine (ATARAX/VISTARIL) 25 MG tablet Take by mouth.    . levothyroxine  (SYNTHROID, LEVOTHROID) 75 MCG tablet Take 1 tablet (75 mcg total) by mouth daily before breakfast. 30 tablet 1  . Multiple Vitamin (MULTI-VITAMINS) TABS Take by mouth.    . mupirocin cream (BACTROBAN) 2 % Apply to affected area on arms/legs twice a day as needed.    . pantoprazole (PROTONIX) 40 MG tablet Take 1 tablet (40 mg total) by mouth daily. 90 tablet 1  . potassium chloride (KLOR-CON) 20 MEQ packet Take by mouth.    . venlafaxine XR (EFFEXOR XR) 75 MG 24 hr capsule Take 1 capsule (75 mg total) by mouth daily with breakfast. 30 capsule 1  . Vitamin D, Ergocalciferol, (DRISDOL) 50000 units CAPS capsule Take 1 capsule every week for 16 weeks.     No current facility-administered medications for this visit.     Patient confirms/reports the following allergies:  Allergies  Allergen Reactions  . Penicillins Rash  . Ciprofloxacin Other (See Comments)  . Morphine Other (See Comments)    No orders of the defined types were placed in this encounter.   AUTHORIZATION INFORMATION Primary Insurance: 1D#: Group #:  Secondary Insurance: 1D#: Group #:  SCHEDULE INFORMATION: Date: 06/14/16 Time: Location: [facility]

## 2016-05-12 DIAGNOSIS — Z8739 Personal history of other diseases of the musculoskeletal system and connective tissue: Secondary | ICD-10-CM | POA: Diagnosis not present

## 2016-05-12 DIAGNOSIS — Z86711 Personal history of pulmonary embolism: Secondary | ICD-10-CM | POA: Diagnosis not present

## 2016-05-12 DIAGNOSIS — M329 Systemic lupus erythematosus, unspecified: Secondary | ICD-10-CM | POA: Diagnosis not present

## 2016-05-12 DIAGNOSIS — Z79899 Other long term (current) drug therapy: Secondary | ICD-10-CM | POA: Diagnosis not present

## 2016-05-14 DIAGNOSIS — Z8739 Personal history of other diseases of the musculoskeletal system and connective tissue: Secondary | ICD-10-CM | POA: Diagnosis not present

## 2016-05-14 DIAGNOSIS — Z7952 Long term (current) use of systemic steroids: Secondary | ICD-10-CM | POA: Diagnosis not present

## 2016-05-14 DIAGNOSIS — R918 Other nonspecific abnormal finding of lung field: Secondary | ICD-10-CM | POA: Diagnosis not present

## 2016-05-14 DIAGNOSIS — J849 Interstitial pulmonary disease, unspecified: Secondary | ICD-10-CM | POA: Diagnosis not present

## 2016-05-18 ENCOUNTER — Encounter: Payer: Self-pay | Admitting: Family Medicine

## 2016-05-18 ENCOUNTER — Ambulatory Visit (INDEPENDENT_AMBULATORY_CARE_PROVIDER_SITE_OTHER): Payer: Medicare PPO | Admitting: Family Medicine

## 2016-05-18 VITALS — BP 154/92 | HR 73 | Ht 63.0 in | Wt 332.0 lb

## 2016-05-18 DIAGNOSIS — Z780 Asymptomatic menopausal state: Secondary | ICD-10-CM

## 2016-05-18 DIAGNOSIS — R05 Cough: Secondary | ICD-10-CM | POA: Diagnosis not present

## 2016-05-18 DIAGNOSIS — Z6841 Body Mass Index (BMI) 40.0 and over, adult: Secondary | ICD-10-CM | POA: Diagnosis not present

## 2016-05-18 DIAGNOSIS — R059 Cough, unspecified: Secondary | ICD-10-CM

## 2016-05-18 DIAGNOSIS — F419 Anxiety disorder, unspecified: Secondary | ICD-10-CM

## 2016-05-18 DIAGNOSIS — F331 Major depressive disorder, recurrent, moderate: Secondary | ICD-10-CM | POA: Diagnosis not present

## 2016-05-18 MED ORDER — FLUTICASONE PROPIONATE 50 MCG/ACT NA SUSP: 2.0000 | Freq: Every day | NASAL | 6 refills | Status: DC

## 2016-05-18 MED ORDER — ALBUTEROL SULFATE HFA 108 (90 BASE) MCG/ACT IN AERS: 2.0000 | INHALATION_SPRAY | Freq: Four times a day (QID) | RESPIRATORY_TRACT | 0 refills | Status: DC | PRN

## 2016-05-18 MED ORDER — VENLAFAXINE HCL ER 150 MG PO CP24: 150.0000 mg | ORAL_CAPSULE | Freq: Every day | ORAL | 3 refills | Status: DC

## 2016-05-18 NOTE — Assessment & Plan Note (Signed)
Improved on current regimen, but not quite there. Will increase effexor to 150mg  daily and recheck in 1 month.

## 2016-05-18 NOTE — Assessment & Plan Note (Signed)
Less symptomatic on her current regimen. Will increase her effexor to 150mg  daily. Recheck 1 month. Call with any concerns.

## 2016-05-18 NOTE — Assessment & Plan Note (Signed)
Referral to bariatric surgery made today. Congratulated patient on her weight loss so far.

## 2016-05-18 NOTE — Progress Notes (Signed)
BP (!) 154/92   Pulse 73   Ht 5\' 3"  (1.6 m)   Wt (!) 332 lb (150.6 kg)   SpO2 99%   BMI 58.81 kg/m    Subjective:    Patient ID: Kaylee Fox, female    DOB: January 12, 1961, 56 y.o.   MRN: [id number]  HPI: Kaylee Fox is a 56 y.o. female  Chief Complaint  Patient presents with  . Menopause  . Anxiety  . Depression   MENOPAUSAL SYMPTOMS- doing better. Feeling more like herself Duration: better Symptom severity: mild Hot flashes: yes Night sweats: yes 2x a night Sleep disturbances: yes Vaginal dryness: no Dyspareunia:no Decreased libido: no Emotional lability: yes Stress incontinence: yes Previous HRT/pharmacotherapy: no Hysterectomy: no  DEPRESSION Mood status: better Satisfied with current treatment?: no Symptom severity: mild  Duration of current treatment : months Side effects: no Medication compliance: excellent compliance Psychotherapy/counseling: no  Previous psychiatric medications: effexor Depressed mood: yes Anxious mood: yes Anhedonia: no Significant weight loss or gain: yes Insomnia: no  Fatigue: yes Feelings of worthlessness or guilt: yes Impaired concentration/indecisiveness: no Suicidal ideations: no Hopelessness: no Crying spells: yes Depression screen PHQ 2/9 04/19/2016  Decreased Interest 0  Down, Depressed, Hopeless 0  PHQ - 2 Score 0   WEIGHT GAIN- would like to see bariatrics. Would like to have gastric sleeve. No referral to bariatrics yet Duration: chronic Previous attempts at weight loss: yes Complications of obesity: HTN,  Peak weight: 344 Weight loss goal: to be healthy Weight loss to date: 12 pounds Requesting obesity pharmacotherapy: no Current weight loss supplements/medications: no Previous weight loss supplements/meds: no  UPPER RESPIRATORY TRACT INFECTION Worst symptom: congestion, cough Fever: no Cough: yes Shortness of breath: no Wheezing: no Chest pain: no Chest tightness: no Chest congestion: no Nasal  congestion: yes Runny nose: yes Post nasal drip: yes Sneezing: yes Sore throat: no Swollen glands: no Sinus pressure: no Headache: no Face pain: no Toothache: no Ear pain: no  Ear pressure: no  Eyes red/itching:no Eye drainage/crusting: no  Vomiting: no Rash: no Fatigue: yes Sick contacts: no Strep contacts: no  Context: worse Recurrent sinusitis: no Relief with OTC cold/cough medications: no  Treatments attempted: none    Relevant past medical, surgical, family and social history reviewed and updated as indicated. Interim medical history since our last visit reviewed. Allergies and medications reviewed and updated.  Review of Systems  Constitutional: Positive for diaphoresis and fatigue. Negative for activity change, appetite change, chills, fever and unexpected weight change.  HENT: Positive for congestion, postnasal drip, rhinorrhea, sinus pressure and sneezing. Negative for dental problem, drooling, ear discharge, ear pain, facial swelling, hearing loss, mouth sores, nosebleeds, sinus pain, sore throat, tinnitus, trouble swallowing and voice change.   Respiratory: Positive for cough. Negative for apnea, choking, chest tightness, shortness of breath, wheezing and stridor.   Cardiovascular: Negative.   Psychiatric/Behavioral: Negative.     Per HPI unless specifically indicated above     Objective:    BP (!) 154/92   Pulse 73   Ht 5\' 3"  (1.6 m)   Wt (!) 332 lb (150.6 kg)   SpO2 99%   BMI 58.81 kg/m   Wt Readings from Last 3 Encounters:  05/18/16 (!) 332 lb (150.6 kg)  04/19/16 (!) 344 lb (156 kg)    Physical Exam  Constitutional: She is oriented to person, place, and time. She appears well-developed and well-nourished. No distress.  HENT:  Head: Normocephalic and atraumatic.  Right Ear: Hearing, tympanic membrane, external  ear and ear canal normal.  Left Ear: Hearing, tympanic membrane, external ear and ear canal normal.  Nose: Mucosal edema and rhinorrhea  present. Right sinus exhibits no maxillary sinus tenderness and no frontal sinus tenderness. Left sinus exhibits no maxillary sinus tenderness and no frontal sinus tenderness.  Mouth/Throat: Uvula is midline, oropharynx is clear and moist and mucous membranes are normal. No oropharyngeal exudate.  Eyes: Conjunctivae, EOM and lids are normal. Pupils are equal, round, and reactive to light. Right eye exhibits no discharge. Left eye exhibits no discharge. No scleral icterus.  Neck: Normal range of motion. Neck supple. No JVD present. No tracheal deviation present. No thyromegaly present.  Cardiovascular: Normal rate, regular rhythm, normal heart sounds and intact distal pulses.  Exam reveals no gallop and no friction rub.   No murmur heard. Pulmonary/Chest: Effort normal and breath sounds normal. No respiratory distress. She has no wheezes. She has no rales. She exhibits no tenderness.  Musculoskeletal: Normal range of motion.  Lymphadenopathy:    She has no cervical adenopathy.  Neurological: She is alert and oriented to person, place, and time.  Skin: Skin is intact. No rash noted. She is not diaphoretic.  Psychiatric: She has a normal mood and affect. Her speech is normal and behavior is normal. Judgment and thought content normal. Cognition and memory are normal.  Nursing note and vitals reviewed.   Results for orders placed or performed in visit on 04/19/16  CBC and differential  Result Value Ref Range   Hemoglobin 13.3 12.0 - 16.0 g/dL   HCT 41 36 - 46 %   Platelets 357 150 - 399 K/L   WBC 8.3 10^3/mL  Basic metabolic panel  Result Value Ref Range   Glucose 112 mg/dL   BUN 9 4 - 21 mg/dL   Creatinine 0.9 0.5 - 1.1 mg/dL   Potassium 3.5 3.4 - 5.3 mmol/L   Sodium 142 137 - 147 mmol/L  Hepatic function panel  Result Value Ref Range   Alkaline Phosphatase 100 25 - 125 U/L   ALT 19 7 - 35 U/L   AST 22 13 - 35 U/L   Bilirubin, Direct 0.1 0.01 - 0.4 mg/dL   Bilirubin, Total 0.6 mg/dL   Hemoglobin J1BA1c  Result Value Ref Range   Hemoglobin A1C 5.6   TSH  Result Value Ref Range   TSH 2.89 0.41 - 5.90 uIU/mL  CBC with Differential/Platelet  Result Value Ref Range   WBC 8.3 3.4 - 10.8 x10E3/uL   RBC 4.20 3.77 - 5.28 x10E6/uL   Hemoglobin 12.9 11.1 - 15.9 g/dL   Hematocrit 14.739.7 82.934.0 - 46.6 %   MCV 95 79 - 97 fL   MCH 30.7 26.6 - 33.0 pg   MCHC 32.5 31.5 - 35.7 g/dL   RDW 56.213.8 13.012.3 - 86.515.4 %   Platelets 450 (H) 150 - 379 x10E3/uL   Neutrophils 66 Not Estab. %   Lymphs 26 Not Estab. %   Monocytes 5 Not Estab. %   Eos 1 Not Estab. %   Basos 1 Not Estab. %   Neutrophils Absolute 5.6 1.4 - 7.0 x10E3/uL   Lymphocytes Absolute 2.1 0.7 - 3.1 x10E3/uL   Monocytes Absolute 0.4 0.1 - 0.9 x10E3/uL   EOS (ABSOLUTE) 0.1 0.0 - 0.4 x10E3/uL   Basophils Absolute 0.1 0.0 - 0.2 x10E3/uL   Immature Granulocytes 1 Not Estab. %   Immature Grans (Abs) 0.1 0.0 - 0.1 x10E3/uL  Comprehensive metabolic panel  Result Value Ref Range  Glucose 96 65 - 99 mg/dL   BUN 14 6 - 24 mg/dL   Creatinine, Ser 1.30 0.57 - 1.00 mg/dL   GFR calc non Af Amer 97 >59 mL/min/1.73   GFR calc Af Amer 112 >59 mL/min/1.73   BUN/Creatinine Ratio 20 9 - 23   Sodium 144 134 - 144 mmol/L   Potassium 4.3 3.5 - 5.2 mmol/L   Chloride 102 96 - 106 mmol/L   CO2 25 18 - 29 mmol/L   Calcium 9.6 8.7 - 10.2 mg/dL   Total Protein 7.6 6.0 - 8.5 g/dL   Albumin 4.1 3.5 - 5.5 g/dL   Globulin, Total 3.5 1.5 - 4.5 g/dL   Albumin/Globulin Ratio 1.2 1.2 - 2.2   Bilirubin Total 0.3 0.0 - 1.2 mg/dL   Alkaline Phosphatase 92 39 - 117 IU/L   AST 21 0 - 40 IU/L   ALT 17 0 - 32 IU/L  Hgb A1c w/o eAG  Result Value Ref Range   Hgb A1c MFr Bld 5.4 4.8 - 5.6 %  Lipid Panel w/o Chol/HDL Ratio  Result Value Ref Range   Cholesterol, Total 219 (H) 100 - 199 mg/dL   Triglycerides 865 0 - 149 mg/dL   HDL 54 >78 mg/dL   VLDL Cholesterol Cal 26 5 - 40 mg/dL   LDL Calculated 469 (H) 0 - 99 mg/dL  Microalbumin, Urine Waived  Result  Value Ref Range   Microalb, Ur Waived 80 (H) 0 - 19 mg/L   Creatinine, Urine Waived 200 10 - 300 mg/dL   Microalb/Creat Ratio 30-300 (H) <30 mg/g  TSH  Result Value Ref Range   TSH 4.540 (H) 0.450 - 4.500 uIU/mL  UA/M w/rflx Culture, Routine  Result Value Ref Range   Specific Gravity, UA 1.025 1.005 - 1.030   pH, UA 5.5 5.0 - 7.5   Color, UA Yellow Yellow   Appearance Ur Clear Clear   Leukocytes, UA Negative Negative   Protein, UA Trace (A) Negative/Trace   Glucose, UA Negative Negative   Ketones, UA Negative Negative   RBC, UA Negative Negative   Bilirubin, UA Negative Negative   Urobilinogen, Ur 0.2 0.2 - 1.0 mg/dL   Nitrite, UA Negative Negative  VITAMIN D 25 Hydroxy (Vit-D Deficiency, Fractures)  Result Value Ref Range   Vit D, 25-Hydroxy 35.7 30.0 - 100.0 ng/mL  POCT erythrocyte sed rate, Non-automated  Result Value Ref Range   Sed Rate 58 mm      Assessment & Plan:   Problem List Items Addressed This Visit      Other   Depression - Primary    Improved on current regimen, but not quite there. Will increase effexor to 150mg  daily and recheck in 1 month.       Relevant Medications   venlafaxine XR (EFFEXOR-XR) 150 MG 24 hr capsule   Anxiety    Improved on current regimen, but not quite there. Will increase effexor to 150mg  daily and recheck in 1 month.       Relevant Medications   venlafaxine XR (EFFEXOR-XR) 150 MG 24 hr capsule   Menopause    Less symptomatic on her current regimen. Will increase her effexor to 150mg  daily. Recheck 1 month. Call with any concerns.       Morbid obesity with BMI of 50.0-59.9, adult [facility])    Referral to bariatric surgery made today. Congratulated patient on her weight loss so far.       Relevant Orders   Amb Referral to  Bariatric Surgery    Other Visit Diagnoses    Cough       PFTs done at [facility]- await results. Will start her on albuterol. Likely allergic component as well. Will start flonase. Call if not getting better or  getting worse       Follow up plan: Return in about 4 weeks (around 06/15/2016) for follow up breathing and mood.

## 2016-05-18 NOTE — Assessment & Plan Note (Signed)
Improved on current regimen, but not quite there. Will increase effexor to 150mg daily and recheck in 1 month.  

## 2016-05-19 DIAGNOSIS — R5381 Other malaise: Secondary | ICD-10-CM | POA: Diagnosis not present

## 2016-05-19 DIAGNOSIS — R5383 Other fatigue: Secondary | ICD-10-CM | POA: Diagnosis not present

## 2016-05-19 DIAGNOSIS — I10 Essential (primary) hypertension: Secondary | ICD-10-CM | POA: Diagnosis not present

## 2016-06-08 ENCOUNTER — Telehealth: Payer: Self-pay | Admitting: Gastroenterology

## 2016-06-08 ENCOUNTER — Other Ambulatory Visit: Payer: Self-pay

## 2016-06-08 DIAGNOSIS — Z1211 Encounter for screening for malignant neoplasm of colon: Secondary | ICD-10-CM

## 2016-06-08 NOTE — Telephone Encounter (Signed)
Ref # [id number] 06/08/16

## 2016-06-08 NOTE — Telephone Encounter (Signed)
Spoke with Kaylee Fox. With [employer]. Authorization NOT required for Screening Colonoscopy 82956 / Z12.11 for 07/16/16

## 2016-06-14 ENCOUNTER — Encounter: Admission: RE | Payer: Self-pay | Source: Ambulatory Visit

## 2016-06-14 ENCOUNTER — Ambulatory Visit
Admission: RE | Admit: 2016-06-14 | Payer: Commercial Managed Care - HMO | Source: Ambulatory Visit | Admitting: Gastroenterology

## 2016-06-14 SURGERY — COLONOSCOPY WITH PROPOFOL
Anesthesia: Choice

## 2016-06-17 ENCOUNTER — Ambulatory Visit (INDEPENDENT_AMBULATORY_CARE_PROVIDER_SITE_OTHER): Payer: Medicare PPO | Admitting: Family Medicine

## 2016-06-17 ENCOUNTER — Encounter: Payer: Self-pay | Admitting: Family Medicine

## 2016-06-17 VITALS — BP 140/88 | HR 70 | Temp 97.9°F | Wt 337.6 lb

## 2016-06-17 DIAGNOSIS — I83019 Varicose veins of right lower extremity with ulcer of unspecified site: Secondary | ICD-10-CM

## 2016-06-17 DIAGNOSIS — F419 Anxiety disorder, unspecified: Secondary | ICD-10-CM | POA: Diagnosis not present

## 2016-06-17 DIAGNOSIS — I83029 Varicose veins of left lower extremity with ulcer of unspecified site: Secondary | ICD-10-CM | POA: Insufficient documentation

## 2016-06-17 DIAGNOSIS — E039 Hypothyroidism, unspecified: Secondary | ICD-10-CM

## 2016-06-17 DIAGNOSIS — L97919 Non-pressure chronic ulcer of unspecified part of right lower leg with unspecified severity: Secondary | ICD-10-CM

## 2016-06-17 DIAGNOSIS — F331 Major depressive disorder, recurrent, moderate: Secondary | ICD-10-CM

## 2016-06-17 DIAGNOSIS — I872 Venous insufficiency (chronic) (peripheral): Secondary | ICD-10-CM

## 2016-06-17 DIAGNOSIS — L97929 Non-pressure chronic ulcer of unspecified part of left lower leg with unspecified severity: Secondary | ICD-10-CM | POA: Diagnosis not present

## 2016-06-17 DIAGNOSIS — R609 Edema, unspecified: Secondary | ICD-10-CM

## 2016-06-17 HISTORY — DX: Varicose veins of left lower extremity with ulcer of unspecified site: I83.029

## 2016-06-17 HISTORY — DX: Non-pressure chronic ulcer of unspecified part of right lower leg with unspecified severity: L97.919

## 2016-06-17 HISTORY — DX: Varicose veins of right lower extremity with ulcer of unspecified site: I83.019

## 2016-06-17 MED ORDER — VENLAFAXINE HCL ER 150 MG PO CP24: 150.0000 mg | ORAL_CAPSULE | Freq: Every day | ORAL | 1 refills | Status: DC

## 2016-06-17 NOTE — Progress Notes (Signed)
BP 140/88 (BP Location: Left Arm, Patient Position: Sitting, Cuff Size: Large)   Pulse 70   Temp 97.9 F (36.6 C)   Wt (!) 337 lb 9.6 oz (153.1 kg)   SpO2 97%   BMI 59.80 kg/m    Subjective:    Patient ID: Kaylee Fox, female    DOB: 04-21-1960, 56 y.o.   MRN: [id number]  HPI: [name] is a 56 y.o. female  Chief Complaint  Patient presents with  . Edema  . Leg Pain  . Foot Pain   DEPRESSION Mood status: better Satisfied with current treatment?: yes Symptom severity: mild  Duration of current treatment : months Side effects: no Medication compliance: excellent compliance Psychotherapy/counseling: no  Previous psychiatric medications: effexor Depressed mood: no Anxious mood: no Anhedonia: no Significant weight loss or gain: no Insomnia: no  Fatigue: no Feelings of worthlessness or guilt: no Impaired concentration/indecisiveness: no Suicidal ideations: no Hopelessness: no Crying spells: no Depression screen [facility] 2/9 06/17/2016 04/19/2016  Decreased Interest 0 0  Down, Depressed, Hopeless 0 0  PHQ - 2 Score 0 0   LEG SWELLING- came on really quickly, goes down in the night, not wearing compression stockings,  Duration: 2 weeks Involved foot: bilateral Mechanism of injury: unknown Location:  Onset: sudden  Severity: moderate Quality:  Aching and tight Frequency: constant Radiation: no Aggravating factors: weight bearing and walking  Alleviating factors: rest at night   Status: worse Treatments attempted: rest at night   Relief with NSAIDs?:  No NSAIDs Taken Weakness with weight bearing or walking: no Morning stiffness: no Swelling: yes Redness: yes Bruising: no Paresthesias / decreased sensation: no  Fevers:no   Relevant past medical, surgical, family and social history reviewed and updated as indicated. Interim medical history since our last visit reviewed. Allergies and medications reviewed and updated.  Review of Systems  Constitutional:  Negative.   Respiratory: Negative.   Cardiovascular: Positive for leg swelling. Negative for chest pain and palpitations.  Musculoskeletal: Positive for arthralgias, gait problem and myalgias. Negative for back pain, joint swelling, neck pain and neck stiffness.  Skin: Positive for color change and wound. Negative for pallor and rash.  Psychiatric/Behavioral: Negative.     Per HPI unless specifically indicated above     Objective:    BP 140/88 (BP Location: Left Arm, Patient Position: Sitting, Cuff Size: Large)   Pulse 70   Temp 97.9 F (36.6 C)   Wt (!) 337 lb 9.6 oz (153.1 kg)   SpO2 97%   BMI 59.80 kg/m   Wt Readings from Last 3 Encounters:  06/17/16 (!) 337 lb 9.6 oz (153.1 kg)  05/18/16 (!) 332 lb (150.6 kg)  04/19/16 (!) 344 lb (156 kg)    Physical Exam  Constitutional: She is oriented to person, place, and time. She appears well-developed and well-nourished. No distress.  HENT:  Head: Normocephalic and atraumatic.  Right Ear: Hearing normal.  Left Ear: Hearing normal.  Nose: Nose normal.  Eyes: Conjunctivae and lids are normal. Right eye exhibits no discharge. Left eye exhibits no discharge. No scleral icterus.  Cardiovascular: Normal rate, regular rhythm, normal heart sounds and intact distal pulses.  Exam reveals no gallop and no friction rub.   No murmur heard. Pulmonary/Chest: Effort normal and breath sounds normal. No respiratory distress. She has no wheezes. She has no rales. She exhibits no tenderness.  Musculoskeletal: Normal range of motion. She exhibits edema.  Neurological: She is alert and oriented to person, place, and time.  Skin: Skin is warm, dry and intact. No rash noted. She is not diaphoretic. There is erythema. No pallor.  Weeping wounds on anterior shins bilaterally  Psychiatric: She has a normal mood and affect. Her speech is normal and behavior is normal. Judgment and thought content normal. Cognition and memory are normal.  Nursing note and  vitals reviewed.   Results for orders placed or performed in visit on 04/19/16  CBC and differential  Result Value Ref Range   Hemoglobin 13.3 12.0 - 16.0 g/dL   HCT 41 36 - 46 %   Platelets 357 150 - 399 K/L   WBC 8.3 10^3/mL  Basic metabolic panel  Result Value Ref Range   Glucose 112 mg/dL   BUN 9 4 - 21 mg/dL   Creatinine 0.9 0.5 - 1.1 mg/dL   Potassium 3.5 3.4 - 5.3 mmol/L   Sodium 142 137 - 147 mmol/L  Hepatic function panel  Result Value Ref Range   Alkaline Phosphatase 100 25 - 125 U/L   ALT 19 7 - 35 U/L   AST 22 13 - 35 U/L   Bilirubin, Direct 0.1 0.01 - 0.4 mg/dL   Bilirubin, Total 0.6 mg/dL  Hemoglobin Z6X  Result Value Ref Range   Hemoglobin A1C 5.6   TSH  Result Value Ref Range   TSH 2.89 0.41 - 5.90 uIU/mL  CBC with Differential/Platelet  Result Value Ref Range   WBC 8.3 3.4 - 10.8 x10E3/uL   RBC 4.20 3.77 - 5.28 x10E6/uL   Hemoglobin 12.9 11.1 - 15.9 g/dL   Hematocrit 09.6 04.5 - 46.6 %   MCV 95 79 - 97 fL   MCH 30.7 26.6 - 33.0 pg   MCHC 32.5 31.5 - 35.7 g/dL   RDW 40.9 81.1 - 91.4 %   Platelets 450 (H) 150 - 379 x10E3/uL   Neutrophils 66 Not Estab. %   Lymphs 26 Not Estab. %   Monocytes 5 Not Estab. %   Eos 1 Not Estab. %   Basos 1 Not Estab. %   Neutrophils Absolute 5.6 1.4 - 7.0 x10E3/uL   Lymphocytes Absolute 2.1 0.7 - 3.1 x10E3/uL   Monocytes Absolute 0.4 0.1 - 0.9 x10E3/uL   EOS (ABSOLUTE) 0.1 0.0 - 0.4 x10E3/uL   Basophils Absolute 0.1 0.0 - 0.2 x10E3/uL   Immature Granulocytes 1 Not Estab. %   Immature Grans (Abs) 0.1 0.0 - 0.1 x10E3/uL  Comprehensive metabolic panel  Result Value Ref Range   Glucose 96 65 - 99 mg/dL   BUN 14 6 - 24 mg/dL   Creatinine, Ser 7.82 0.57 - 1.00 mg/dL   GFR calc non Af Amer 97 >59 mL/min/1.73   GFR calc Af Amer 112 >59 mL/min/1.73   BUN/Creatinine Ratio 20 9 - 23   Sodium 144 134 - 144 mmol/L   Potassium 4.3 3.5 - 5.2 mmol/L   Chloride 102 96 - 106 mmol/L   CO2 25 18 - 29 mmol/L   Calcium 9.6 8.7 -  10.2 mg/dL   Total Protein 7.6 6.0 - 8.5 g/dL   Albumin 4.1 3.5 - 5.5 g/dL   Globulin, Total 3.5 1.5 - 4.5 g/dL   Albumin/Globulin Ratio 1.2 1.2 - 2.2   Bilirubin Total 0.3 0.0 - 1.2 mg/dL   Alkaline Phosphatase 92 39 - 117 IU/L   AST 21 0 - 40 IU/L   ALT 17 0 - 32 IU/L  Hgb A1c w/o eAG  Result Value Ref Range   Hgb A1c MFr Bld  5.4 4.8 - 5.6 %  Lipid Panel w/o Chol/HDL Ratio  Result Value Ref Range   Cholesterol, Total 219 (H) 100 - 199 mg/dL   Triglycerides 657 0 - 149 mg/dL   HDL 54 >84 mg/dL   VLDL Cholesterol Cal 26 5 - 40 mg/dL   LDL Calculated 696 (H) 0 - 99 mg/dL  Microalbumin, Urine Waived  Result Value Ref Range   Microalb, Ur Waived 80 (H) 0 - 19 mg/L   Creatinine, Urine Waived 200 10 - 300 mg/dL   Microalb/Creat Ratio 30-300 (H) <30 mg/g  TSH  Result Value Ref Range   TSH 4.540 (H) 0.450 - 4.500 uIU/mL  UA/M w/rflx Culture, Routine  Result Value Ref Range   Specific Gravity, UA 1.025 1.005 - 1.030   pH, UA 5.5 5.0 - 7.5   Color, UA Yellow Yellow   Appearance Ur Clear Clear   Leukocytes, UA Negative Negative   Protein, UA Trace (A) Negative/Trace   Glucose, UA Negative Negative   Ketones, UA Negative Negative   RBC, UA Negative Negative   Bilirubin, UA Negative Negative   Urobilinogen, Ur 0.2 0.2 - 1.0 mg/dL   Nitrite, UA Negative Negative  VITAMIN D 25 Hydroxy (Vit-D Deficiency, Fractures)  Result Value Ref Range   Vit D, 25-Hydroxy 35.7 30.0 - 100.0 ng/mL  POCT erythrocyte sed rate, Non-automated  Result Value Ref Range   Sed Rate 58 mm      Assessment & Plan:   Problem List Items Addressed This Visit      Musculoskeletal and Integument   Venous ulcers of both lower extremities (HCC)    Will get her into vascular. Continue to monitor. Recheck 1 month if she hasn't seen vascular.       Relevant Orders   Ambulatory referral to Vascular Surgery     Other   Depression    Under good control. Continue current regimen. Continue to monitor. Call with  any concerns.       Relevant Medications   venlafaxine XR (EFFEXOR-XR) 150 MG 24 hr capsule   Anxiety    Under good control. Continue current regimen. Continue to monitor. Call with any concerns.       Relevant Medications   venlafaxine XR (EFFEXOR-XR) 150 MG 24 hr capsule    Other Visit Diagnoses    Peripheral edema    -  Primary   Will get her started with compression stockings and get her into vascular. Call with any concerns.    Relevant Orders   Ambulatory referral to Vascular Surgery   Hypothyroidism, unspecified type       Rechecking levels today. Will adjust as needed. Call with any concerns.        Follow up plan: Return in about 4 weeks (around 07/15/2016) for recheck wounds.

## 2016-06-17 NOTE — Assessment & Plan Note (Signed)
Under good control. Continue current regimen. Continue to monitor. Call with any concerns. 

## 2016-06-17 NOTE — Assessment & Plan Note (Signed)
Will get her into vascular. Continue to monitor. Recheck 1 month if she hasn't seen vascular.

## 2016-06-18 ENCOUNTER — Telehealth: Payer: Self-pay | Admitting: Family Medicine

## 2016-06-18 LAB — TSH: TSH: 3.32 u[IU]/mL (ref 0.450–4.500)

## 2016-06-18 MED ORDER — LEVOTHYROXINE SODIUM 75 MCG PO TABS: 75.0000 ug | ORAL_TABLET | Freq: Every day | ORAL | 3 refills | Status: DC

## 2016-06-18 NOTE — Telephone Encounter (Signed)
Rx sent to her pharm

## 2016-06-22 ENCOUNTER — Encounter (INDEPENDENT_AMBULATORY_CARE_PROVIDER_SITE_OTHER): Payer: Self-pay | Admitting: Vascular Surgery

## 2016-06-22 ENCOUNTER — Ambulatory Visit (INDEPENDENT_AMBULATORY_CARE_PROVIDER_SITE_OTHER): Payer: Medicare PPO | Admitting: Vascular Surgery

## 2016-06-22 VITALS — BP 152/86 | HR 75 | Ht 64.0 in | Wt 339.0 lb

## 2016-06-22 DIAGNOSIS — L97919 Non-pressure chronic ulcer of unspecified part of right lower leg with unspecified severity: Secondary | ICD-10-CM | POA: Diagnosis not present

## 2016-06-22 DIAGNOSIS — I872 Venous insufficiency (chronic) (peripheral): Secondary | ICD-10-CM

## 2016-06-22 DIAGNOSIS — Z6841 Body Mass Index (BMI) 40.0 and over, adult: Secondary | ICD-10-CM

## 2016-06-22 DIAGNOSIS — L97929 Non-pressure chronic ulcer of unspecified part of left lower leg with unspecified severity: Secondary | ICD-10-CM | POA: Diagnosis not present

## 2016-06-22 DIAGNOSIS — M7989 Other specified soft tissue disorders: Secondary | ICD-10-CM | POA: Diagnosis not present

## 2016-06-22 NOTE — Progress Notes (Signed)
Patient ID: Kaylee Fox, female   DOB: 1960-10-25, 56 y.o.   MRN: [id number]  Chief Complaint  Patient presents with  . New Evaluation    Edema and ulcers    HPI Kaylee Fox is a 56 y.o. female.  I am asked to see the patient by Dr. Laural Benes for evaluation of leg swelling.  The patient reports Gradual progression of her months or years with swelling and pain in both lower extremities. She has noticed more prominent varicosities as well. The left lower extremity is more severely affected of the 2 legs. She has developed some dry skin and small scabs on both lower extremities. These are slow to heal. The swelling is predominantly in the calf and lower leg area although she has swelling in her thighs as well. She stands for much of the day or sits and does not have much elevation. She was just prescribed compression stockings from her primary care physician which have improved her symptoms some. She does not have fever or chills. She denies a previous history of DVT or superficial thrombophlebitis.   Past Medical History:  Diagnosis Date  . Anticoagulant long-term use   . Anxiety   . Autoimmune hypothyroidism   . Chronic hypokalemia   . Chronic pain   . Chronic, continuous use of opioids   . Depression   . Diverticulosis   . Fatigue   . Fibromyalgia   . Herpes labialis   . Herpes simplex   . History of pulmonary embolus (PE)   . History of pulmonary hypertension   . Hypertension   . Lupus (systemic lupus erythematosus) (HCC)   . Neuromuscular disorder (HCC)   . OSA on CPAP   . Osteoporosis   . Vitamin D deficiency     Past Surgical History:  Procedure Laterality Date  . CESAREAN SECTION    . CHOLECYSTECTOMY      Family History  Problem Relation Age of Onset  . Arthritis Mother   . Osteoarthritis Mother   . Lupus Mother   . Arthritis Father   . Osteoarthritis Father   . Lupus Father   No bleeding or clotting disorders  Social History Social History  Substance  Use Topics  . Smoking status: Never Smoker  . Smokeless tobacco: Never Used  . Alcohol use No  No IV drug use  Allergies  Allergen Reactions  . Penicillins Rash  . Ciprofloxacin Other (See Comments)  . Morphine Other (See Comments)    Current Outpatient Prescriptions  Medication Sig Dispense Refill  . acetaminophen (TYLENOL) 325 MG tablet Take by mouth.    Marland Kitchen albuterol (PROVENTIL HFA;VENTOLIN HFA) 108 (90 Base) MCG/ACT inhaler Inhale 2 puffs into the lungs every 6 (six) hours as needed for wheezing or shortness of breath. 1 Inhaler 0  . amLODipine (NORVASC) 10 MG tablet Take 1 tablet (10 mg total) by mouth daily. 90 tablet 1  . apixaban (ELIQUIS) 5 MG TABS tablet Take 1 tablet (5 mg total) by mouth 2 (two) times daily. 180 tablet 1  . azaTHIOprine (IMURAN) 50 MG tablet Take 100 mg by mouth daily.    . baclofen (LIORESAL) 10 MG tablet Take 1 tablet (10 mg total) by mouth daily as needed for muscle spasms. 90 tablet 1  . calcium carbonate (CALCIUM 600) 600 MG TABS tablet Take by mouth.    . Cholecalciferol (D3-1000) 1000 units capsule Take 1,000 Units by mouth daily. Takes 2 1000 mg per day    . fluticasone (FLONASE)  50 MCG/ACT nasal spray Place 2 sprays into both nostrils daily. 16 g 6  . furosemide (LASIX) 40 MG tablet Take 1 tablet (40 mg total) by mouth 2 (two) times daily. 180 tablet 1  . gabapentin (NEURONTIN) 300 MG capsule Take 2 capsules (600 mg total) by mouth 2 (two) times daily. 360 capsule 1  . hydroxychloroquine (PLAQUENIL) 200 MG tablet Take 200 mg by mouth. Take 3 200 mg tabs a day    . hydrOXYzine (ATARAX/VISTARIL) 25 MG tablet Take by mouth.    . levothyroxine (SYNTHROID, LEVOTHROID) 75 MCG tablet Take 1 tablet (75 mcg total) by mouth daily before breakfast. 90 tablet 3  . Multiple Vitamin (MULTI-VITAMINS) TABS Take by mouth.    . mupirocin cream (BACTROBAN) 2 % Apply to affected area on arms/legs twice a day as needed.    . pantoprazole (PROTONIX) 40 MG tablet Take 1  tablet (40 mg total) by mouth daily. 90 tablet 1  . potassium chloride (KLOR-CON) 20 MEQ packet Take by mouth.    . predniSONE (DELTASONE) 1 MG tablet Take 3 mg by mouth.     . venlafaxine XR (EFFEXOR-XR) 150 MG 24 hr capsule Take 1 capsule (150 mg total) by mouth daily with breakfast. 90 capsule 1  . VOLTAREN 1 % GEL      No current facility-administered medications for this visit.       REVIEW OF SYSTEMS (Negative unless checked)  Constitutional: Weight loss  Fever  Chills Cardiac: Chest pain   Chest pressure   Palpitations   Shortness of breath when laying flat   Shortness of breath at rest   Shortness of breath with exertion. Vascular:  Pain in legs with walking   Pain in legs at rest   Pain in legs when laying flat   Claudication   Pain in feet when walking  Pain in feet at rest  Pain in feet when laying flat   History of DVT   Phlebitis   Swelling in legs   Varicose veins   Non-healing ulcers Pulmonary:   Uses home oxygen   Productive cough   Hemoptysis   Wheeze  COPD   Asthma Neurologic:  Dizziness  Blackouts   Seizures   History of stroke   History of TIA  Aphasia   Temporary blindness   Dysphagia   Weakness or numbness in arms   Weakness or numbness in legs Musculoskeletal:  Arthritis   Joint swelling   Joint pain   Low back pain Hematologic:  Easy bruising  Easy bleeding   Hypercoagulable state   Anemic  Hepatitis Gastrointestinal:  Blood in stool   Vomiting blood  Gastroesophageal reflux/heartburn   Abdominal pain Genitourinary:  Chronic kidney disease   Difficult urination  Frequent urination  Burning with urination   Hematuria Skin:  Rashes   Ulcers   Wounds Psychological:  History of anxiety    History of major depression.    Physical Exam BP (!) 152/86 (BP Location: Right Arm)   Pulse 75   Ht  (1.626 m)   Wt (!) 339 lb (153.8 kg)   BMI 58.19  kg/m  Gen:  WD/WN, NAD. Morbidly obese Head: [place]/AT, No temporalis wasting.  Ear/Nose/Throat: Hearing grossly intact, nares w/o erythema or drainage, oropharynx w/o Erythema/Exudate Eyes: Conjunctiva clear, sclera non-icteric  Neck: trachea midline.  No JVD.  Pulmonary:  Good air movement, respirations not labored, no use of accessory muscles Cardiac: RRR, no JVD Vascular:  Vessel Right Left  Radial Palpable Palpable  Ulnar Palpable Palpable  Brachial Palpable Palpable  Carotid Palpable, without bruit Palpable, without bruit  Aorta Not palpable N/A  Femoral Palpable Palpable  Popliteal Palpable Palpable  PT 1+ Palpable Trace Palpable  DP Palpable Palpable   Gastrointestinal: soft, non-tender/non-distended.  Musculoskeletal: M/S 5/5 throughout.  Extremities without ischemic changes.  No deformity or atrophy. 2-3+ bilateral lower extremity edema a little worse on the left. Neurologic: Sensation grossly intact in extremities.  Symmetrical.  Speech is fluent. Motor exam as listed above. Psychiatric: Judgment intact, Mood & affect appropriate for pt's clinical situation. Dermatologic: two small ulcerations on the left lateral calf, one on the right lateral calf.  No erythema or drainage.    Radiology No results found.  Labs Recent Results (from the past 2160 hour(s))  CBC with Differential/Platelet     Status: Abnormal   Collection Time: 04/19/16  4:06 PM  Result Value Ref Range   WBC 8.3 3.4 - 10.8 x10E3/uL   RBC 4.20 3.77 - 5.28 x10E6/uL   Hemoglobin 12.9 11.1 - 15.9 g/dL   Hematocrit 16.1 09.6 - 46.6 %   MCV 95 79 - 97 fL   MCH 30.7 26.6 - 33.0 pg   MCHC 32.5 31.5 - 35.7 g/dL   RDW 04.5 40.9 - 81.1 %   Platelets 450 (H) 150 - 379 x10E3/uL   Neutrophils 66 Not Estab. %   Lymphs 26 Not Estab. %   Monocytes 5 Not Estab. %   Eos 1 Not Estab. %   Basos 1 Not Estab. %   Neutrophils Absolute 5.6 1.4 - 7.0 x10E3/uL   Lymphocytes Absolute 2.1 0.7 - 3.1 x10E3/uL   Monocytes  Absolute 0.4 0.1 - 0.9 x10E3/uL   EOS (ABSOLUTE) 0.1 0.0 - 0.4 x10E3/uL   Basophils Absolute 0.1 0.0 - 0.2 x10E3/uL   Immature Granulocytes 1 Not Estab. %   Immature Grans (Abs) 0.1 0.0 - 0.1 x10E3/uL  Comprehensive metabolic panel     Status: None   Collection Time: 04/19/16  4:06 PM  Result Value Ref Range   Glucose 96 65 - 99 mg/dL   BUN 14 6 - 24 mg/dL   Creatinine, Ser 9.14 0.57 - 1.00 mg/dL   GFR calc non Af Amer 97 >59 mL/min/1.73   GFR calc Af Amer 112 >59 mL/min/1.73   BUN/Creatinine Ratio 20 9 - 23   Sodium 144 134 - 144 mmol/L   Potassium 4.3 3.5 - 5.2 mmol/L   Chloride 102 96 - 106 mmol/L   CO2 25 18 - 29 mmol/L   Calcium 9.6 8.7 - 10.2 mg/dL   Total Protein 7.6 6.0 - 8.5 g/dL   Albumin 4.1 3.5 - 5.5 g/dL   Globulin, Total 3.5 1.5 - 4.5 g/dL   Albumin/Globulin Ratio 1.2 1.2 - 2.2   Bilirubin Total 0.3 0.0 - 1.2 mg/dL   Alkaline Phosphatase 92 39 - 117 IU/L   AST 21 0 - 40 IU/L   ALT 17 0 - 32 IU/L  Hgb A1c w/o eAG     Status: None   Collection Time: 04/19/16  4:06 PM  Result Value Ref Range   Hgb A1c MFr Bld 5.4 4.8 - 5.6 %    Comment:          Pre-diabetes: 5.7 - 6.4          Diabetes: >6.4          Glycemic control for adults with diabetes: <7.0   Lipid Panel w/o Chol/HDL  Ratio     Status: Abnormal   Collection Time: 04/19/16  4:06 PM  Result Value Ref Range   Cholesterol, Total 219 (H) 100 - 199 mg/dL   Triglycerides 409 0 - 149 mg/dL   HDL 54 >81 mg/dL   VLDL Cholesterol Cal 26 5 - 40 mg/dL   LDL Calculated 191 (H) 0 - 99 mg/dL  TSH     Status: Abnormal   Collection Time: 04/19/16  4:06 PM  Result Value Ref Range   TSH 4.540 (H) 0.450 - 4.500 uIU/mL  VITAMIN D 25 Hydroxy (Vit-D Deficiency, Fractures)     Status: None   Collection Time: 04/19/16  4:06 PM  Result Value Ref Range   Vit D, 25-Hydroxy 35.7 30.0 - 100.0 ng/mL    Comment: Vitamin D deficiency has been defined by the Institute of Medicine and an Endocrine Society practice guideline as  a level of serum 25-OH vitamin D less than 20 ng/mL (1,2). The Endocrine Society went on to further define vitamin D insufficiency as a level between 21 and 29 ng/mL (2). 1. IOM (Institute of Medicine). 2010. Dietary reference    intakes for calcium and D. [place]: The    [employer]. 2. Holick MF, Binkley Scandia, Bischoff-Ferrari HA, et al.    Evaluation, treatment, and prevention of vitamin D    deficiency: an Endocrine Society clinical practice    guideline. JCEM. 2011 Jul; 96(7):1911-30.   Microalbumin, Urine Waived     Status: Abnormal   Collection Time: 04/19/16  4:07 PM  Result Value Ref Range   Microalb, Ur Waived 80 (H) 0 - 19 mg/L   Creatinine, Urine Waived 200 10 - 300 mg/dL   Microalb/Creat Ratio 30-300 (H) <30 mg/g    Comment:                              Abnormal:       30 - 300                         High Abnormal:           >300   UA/M w/rflx Culture, Routine     Status: Abnormal   Collection Time: 04/19/16  4:07 PM  Result Value Ref Range   Specific Gravity, UA 1.025 1.005 - 1.030   pH, UA 5.5 5.0 - 7.5   Color, UA Yellow Yellow   Appearance Ur Clear Clear   Leukocytes, UA Negative Negative   Protein, UA Trace (A) Negative/Trace   Glucose, UA Negative Negative   Ketones, UA Negative Negative   RBC, UA Negative Negative   Bilirubin, UA Negative Negative   Urobilinogen, Ur 0.2 0.2 - 1.0 mg/dL   Nitrite, UA Negative Negative  TSH     Status: None   Collection Time: 06/17/16  3:24 PM  Result Value Ref Range   TSH 3.320 0.450 - 4.500 uIU/mL    Assessment/Plan:  Morbid obesity with BMI of 50.0-59.9, adult (HCC) Definitely contributes to LE swelling and pain  Venous ulcers of both lower extremities (HCC) UNNA boots placed today.  Have recommended she wear her compression stockings once we come out of the Unna boots every day. Have recommended increasing activity, weight loss, leg elevation, and a workup for significant venous disease to assess  for thrombotic or reflux issues. I discussed the pathophysiology and natural history of venous disease and lymphedema.  She may ultimately benefit from a lymphedema pump pending the results of her ultrasound as well.  Swelling of limb UNNA boots placed today.  Have recommended she wear her compression stockings once we come out of the Unna boots every day. Have recommended increasing activity, weight loss, leg elevation, and a workup for significant venous disease to assess for thrombotic or reflux issues. I discussed the pathophysiology and natural history of venous disease and lymphedema. She may ultimately benefit from a lymphedema pump pending the results of her ultrasound as well.      Festus Barren 06/22/2016, 4:38 PM   This note was created with Dragon medical transcription system.  Any errors from dictation are unintentional.

## 2016-06-22 NOTE — Assessment & Plan Note (Signed)
UNNA boots placed today.  Have recommended she wear her compression stockings once we come out of the Unna boots every day. Have recommended increasing activity, weight loss, leg elevation, and a workup for significant venous disease to assess for thrombotic or reflux issues. I discussed the pathophysiology and natural history of venous disease and lymphedema. She may ultimately benefit from a lymphedema pump pending the results of her ultrasound as well. 

## 2016-06-22 NOTE — Patient Instructions (Signed)
Lymphedema Lymphedema is swelling that is caused by the abnormal collection of lymph under the skin. Lymph is fluid from the tissues in your body that travels in the lymphatic system. This system is part of the immune system and includes lymph nodes and lymph vessels. The lymph vessels collect and carry the excess fluid, fats, proteins, and wastes from the tissues of the body to the bloodstream. This system also works to clean and remove bacteria and waste products from the body. Lymphedema occurs when the lymphatic system is blocked. When the lymph vessels or lymph nodes are blocked or damaged, lymph does not drain properly, causing an abnormal buildup of lymph. This leads to swelling in the arms or legs. Lymphedema cannot be cured by medicines, but various methods can be used to help reduce the swelling. What are the causes? There are two types of lymphedema. Primary lymphedema is caused by the absence or abnormality of the lymph vessel at birth. Secondary lymphedema is more common. It occurs when the lymph vessel is damaged or blocked. Common causes of lymph vessel blockage include:  Skin infection, such as cellulitis.  Infection by parasites (filariasis).  Injury.  Cancer.  Radiation therapy.  Formation of scar tissue.  Surgery. What are the signs or symptoms? Symptoms of this condition include:  Swelling of the arm or leg.  A heavy or tight feeling in the arm or leg.  Swelling of the feet, toes, or fingers. Shoes or rings may fit more tightly than before.  Redness of the skin over the affected area.  Limited movement of the affected limb.  Sensitivity to touch or discomfort in the affected limb. How is this diagnosed? This condition may be diagnosed with:  A physical exam.  Medical history.  Bioimpedance spectroscopy. In this test, painless electrical currents are used to measure fluid levels in your body.  Imaging tests, such as:  Lymphoscintigraphy. In this test, a  low dose of a radioactive substance is injected to trace the flow of lymph through the lymph vessels.  MRI.  CT scan.  Duplex ultrasound. This test uses sound waves to produce images of the vessels and the blood flow on a screen.  Lymphangiography. In this test, a contrast dye is injected into the lymph vessel to help show blockages. How is this treated? Treatment for this condition may depend on the cause. Treatment may include:  Exercise. Certain exercises can help fluid move out of the affected limb.  Massage. Gentle massage of the affected limb can help move the fluid out of the area.  Compression. Various methods may be used to apply pressure to the affected limb in order to reduce the swelling.  Wearing compression stockings or sleeves on the affected limb.  Bandaging the affected limb.  Using an external pump that is attached to a sleeve that alternates between applying pressure and releasing pressure.  Surgery. This is usually only done for severe cases. For example, surgery may be done if you have trouble moving the limb or if the swelling does not get better with other treatments. If an underlying condition is causing the lymphedema, treatment for that condition is needed. For example, antibiotic medicines may be used to treat an infection. Follow these instructions at home: Activities   Exercise regularly as directed by your health care provider.  Do not sit with your legs crossed.  When possible, keep the affected limb raised (elevated) above the level of your heart.  Avoid carrying things with an arm that is   affected by lymphedema.  Remember that the affected area is more likely to become injured or infected.  Take these steps to help prevent infection:  Keep the affected area clean and dry.  Protect your skin from cuts. For example, you should use gloves while cooking or gardening. Do not walk barefoot. If you shave the affected area, use an electric  razor. General instructions   Take medicines only as directed by your health care provider.  Eat a healthy diet that includes a lot of fruits and vegetables.  Do not wear tight clothes, shoes, or jewelry.  Do not use heating pads over the affected area.  Avoid having blood pressure checked on the affected limb.  Keep all follow-up visits as directed by your health care provider. This is important. Contact a health care provider if:  You continue to have swelling in your limb.  You have a fever.  You have a cut that does not heal.  You have redness or pain in the affected area.  You have new swelling in your limb that comes on suddenly.  You develop purplish spots or sores (lesions) on your limb. Get help right away if:  You have a skin rash.  You have chills or sweats.  You have shortness of breath. This information is not intended to replace advice given to you by your health care provider. Make sure you discuss any questions you have with your health care provider. Document Released: 12/20/2006 Document Revised: 10/30/2015 Document Reviewed: 01/30/2014 Elsevier Interactive Patient Education  2017 [employer]  

## 2016-06-22 NOTE — Assessment & Plan Note (Signed)
Definitely contributes to LE swelling and pain

## 2016-06-22 NOTE — Assessment & Plan Note (Signed)
UNNA boots placed today.  Have recommended she wear her compression stockings once we come out of the Unna boots every day. Have recommended increasing activity, weight loss, leg elevation, and a workup for significant venous disease to assess for thrombotic or reflux issues. I discussed the pathophysiology and natural history of venous disease and lymphedema. She may ultimately benefit from a lymphedema pump pending the results of her ultrasound as well.

## 2016-06-25 ENCOUNTER — Telehealth: Payer: Self-pay | Admitting: Family Medicine

## 2016-06-25 MED ORDER — FUROSEMIDE 40 MG PO TABS: 40.0000 mg | ORAL_TABLET | Freq: Two times a day (BID) | ORAL | 0 refills | Status: DC

## 2016-06-25 NOTE — Telephone Encounter (Signed)
Rx sent to her pharmacy 

## 2016-06-29 ENCOUNTER — Encounter (INDEPENDENT_AMBULATORY_CARE_PROVIDER_SITE_OTHER): Payer: Self-pay

## 2016-06-29 ENCOUNTER — Ambulatory Visit (INDEPENDENT_AMBULATORY_CARE_PROVIDER_SITE_OTHER): Payer: Medicare PPO | Admitting: Vascular Surgery

## 2016-06-29 VITALS — BP 153/92 | HR 72 | Resp 16 | Ht 63.0 in | Wt 331.0 lb

## 2016-06-29 DIAGNOSIS — L97919 Non-pressure chronic ulcer of unspecified part of right lower leg with unspecified severity: Secondary | ICD-10-CM

## 2016-06-29 DIAGNOSIS — L97929 Non-pressure chronic ulcer of unspecified part of left lower leg with unspecified severity: Secondary | ICD-10-CM | POA: Diagnosis not present

## 2016-06-29 DIAGNOSIS — I872 Venous insufficiency (chronic) (peripheral): Secondary | ICD-10-CM | POA: Diagnosis not present

## 2016-06-29 NOTE — Progress Notes (Signed)
History of Present Illness  There is no documented history at this time  Assessments & Plan   There are no diagnoses linked to this encounter.    Additional instructions  Subjective:  Patient presents with venous ulcer of the Bilateral lower extremity.    Procedure:  3 layer unna wrap was placed Bilateral lower extremity.   Plan:   Follow up in one week.  

## 2016-07-06 ENCOUNTER — Encounter (INDEPENDENT_AMBULATORY_CARE_PROVIDER_SITE_OTHER): Payer: Self-pay

## 2016-07-06 ENCOUNTER — Ambulatory Visit (INDEPENDENT_AMBULATORY_CARE_PROVIDER_SITE_OTHER): Payer: Medicare PPO | Admitting: Vascular Surgery

## 2016-07-06 VITALS — BP 135/81 | HR 78 | Resp 16 | Wt 328.0 lb

## 2016-07-06 DIAGNOSIS — L97919 Non-pressure chronic ulcer of unspecified part of right lower leg with unspecified severity: Secondary | ICD-10-CM

## 2016-07-06 DIAGNOSIS — L97929 Non-pressure chronic ulcer of unspecified part of left lower leg with unspecified severity: Secondary | ICD-10-CM

## 2016-07-06 DIAGNOSIS — M7989 Other specified soft tissue disorders: Secondary | ICD-10-CM

## 2016-07-06 NOTE — Progress Notes (Signed)
History of Present Illness  There is no documented history at this time  Assessments & Plan   There are no diagnoses linked to this encounter.    Additional instructions  Subjective:  Patient presents with venous ulcer of the Bilateral lower extremity.    Procedure:  3 layer unna wrap was placed Bilateral lower extremity.   Plan:   Follow up in one week.  

## 2016-07-13 ENCOUNTER — Encounter (INDEPENDENT_AMBULATORY_CARE_PROVIDER_SITE_OTHER): Payer: Self-pay | Admitting: Vascular Surgery

## 2016-07-13 ENCOUNTER — Ambulatory Visit (INDEPENDENT_AMBULATORY_CARE_PROVIDER_SITE_OTHER): Payer: Medicare PPO | Admitting: Vascular Surgery

## 2016-07-13 VITALS — BP 137/85 | HR 74 | Resp 16 | Ht 63.0 in | Wt 327.0 lb

## 2016-07-13 DIAGNOSIS — M7989 Other specified soft tissue disorders: Secondary | ICD-10-CM

## 2016-07-13 NOTE — Progress Notes (Signed)
History of Present Illness  There is no documented history at this time  Assessments & Plan   There are no diagnoses linked to this encounter.    Additional instructions  Subjective:  Patient presents with venous ulcer of the Bilateral lower extremity.    Procedure:  3 layer unna wrap was placed Bilateral lower extremity.   Plan:   Follow up in one week.  

## 2016-07-14 DIAGNOSIS — R638 Other symptoms and signs concerning food and fluid intake: Secondary | ICD-10-CM | POA: Diagnosis not present

## 2016-07-14 DIAGNOSIS — Z5181 Encounter for therapeutic drug level monitoring: Secondary | ICD-10-CM | POA: Diagnosis not present

## 2016-07-14 DIAGNOSIS — Z01812 Encounter for preprocedural laboratory examination: Secondary | ICD-10-CM | POA: Diagnosis not present

## 2016-07-14 DIAGNOSIS — F331 Major depressive disorder, recurrent, moderate: Secondary | ICD-10-CM | POA: Diagnosis not present

## 2016-07-14 DIAGNOSIS — Z79899 Other long term (current) drug therapy: Secondary | ICD-10-CM | POA: Diagnosis not present

## 2016-07-14 DIAGNOSIS — R0602 Shortness of breath: Secondary | ICD-10-CM | POA: Diagnosis not present

## 2016-07-14 DIAGNOSIS — G4733 Obstructive sleep apnea (adult) (pediatric): Secondary | ICD-10-CM | POA: Diagnosis not present

## 2016-07-14 DIAGNOSIS — I10 Essential (primary) hypertension: Secondary | ICD-10-CM | POA: Diagnosis not present

## 2016-07-15 ENCOUNTER — Encounter: Payer: Self-pay | Admitting: *Deleted

## 2016-07-16 ENCOUNTER — Encounter
Admission: RE | Disposition: A | Discharge status: Home / Self Care | Payer: Self-pay | Source: Ambulatory Visit | Attending: Gastroenterology

## 2016-07-16 ENCOUNTER — Ambulatory Visit: Payer: Medicare PPO | Admitting: Anesthesiology

## 2016-07-16 ENCOUNTER — Ambulatory Visit
Admission: RE | Admit: 2016-07-16 | Discharge: 2016-07-16 | Disposition: A | Discharge status: Home / Self Care | Payer: Medicare PPO | Source: Ambulatory Visit | Attending: Gastroenterology | Admitting: Gastroenterology

## 2016-07-16 ENCOUNTER — Encounter: Payer: Self-pay | Admitting: *Deleted

## 2016-07-16 DIAGNOSIS — E063 Autoimmune thyroiditis: Secondary | ICD-10-CM | POA: Diagnosis not present

## 2016-07-16 DIAGNOSIS — E876 Hypokalemia: Secondary | ICD-10-CM | POA: Insufficient documentation

## 2016-07-16 DIAGNOSIS — Z7901 Long term (current) use of anticoagulants: Secondary | ICD-10-CM | POA: Diagnosis not present

## 2016-07-16 DIAGNOSIS — Z1211 Encounter for screening for malignant neoplasm of colon: Secondary | ICD-10-CM | POA: Insufficient documentation

## 2016-07-16 DIAGNOSIS — K5790 Diverticulosis of intestine, part unspecified, without perforation or abscess without bleeding: Secondary | ICD-10-CM | POA: Diagnosis not present

## 2016-07-16 DIAGNOSIS — F419 Anxiety disorder, unspecified: Secondary | ICD-10-CM | POA: Diagnosis not present

## 2016-07-16 DIAGNOSIS — E559 Vitamin D deficiency, unspecified: Secondary | ICD-10-CM | POA: Insufficient documentation

## 2016-07-16 DIAGNOSIS — M797 Fibromyalgia: Secondary | ICD-10-CM | POA: Diagnosis not present

## 2016-07-16 DIAGNOSIS — G4730 Sleep apnea, unspecified: Secondary | ICD-10-CM | POA: Diagnosis not present

## 2016-07-16 DIAGNOSIS — I10 Essential (primary) hypertension: Secondary | ICD-10-CM | POA: Insufficient documentation

## 2016-07-16 DIAGNOSIS — M329 Systemic lupus erythematosus, unspecified: Secondary | ICD-10-CM | POA: Diagnosis not present

## 2016-07-16 DIAGNOSIS — M810 Age-related osteoporosis without current pathological fracture: Secondary | ICD-10-CM | POA: Insufficient documentation

## 2016-07-16 DIAGNOSIS — F329 Major depressive disorder, single episode, unspecified: Secondary | ICD-10-CM | POA: Insufficient documentation

## 2016-07-16 DIAGNOSIS — G4733 Obstructive sleep apnea (adult) (pediatric): Secondary | ICD-10-CM | POA: Insufficient documentation

## 2016-07-16 DIAGNOSIS — Z79899 Other long term (current) drug therapy: Secondary | ICD-10-CM | POA: Diagnosis not present

## 2016-07-16 DIAGNOSIS — Z8371 Family history of colonic polyps: Secondary | ICD-10-CM | POA: Diagnosis not present

## 2016-07-16 DIAGNOSIS — Z86711 Personal history of pulmonary embolism: Secondary | ICD-10-CM | POA: Diagnosis not present

## 2016-07-16 DIAGNOSIS — K5730 Diverticulosis of large intestine without perforation or abscess without bleeding: Secondary | ICD-10-CM | POA: Insufficient documentation

## 2016-07-16 DIAGNOSIS — G8929 Other chronic pain: Secondary | ICD-10-CM | POA: Insufficient documentation

## 2016-07-16 HISTORY — PX: COLONOSCOPY WITH PROPOFOL: SHX5780

## 2016-07-16 SURGERY — COLONOSCOPY WITH PROPOFOL
Anesthesia: General

## 2016-07-16 MED ORDER — PROPOFOL 500 MG/50ML IV EMUL
INTRAVENOUS | Status: DC | PRN
  Administered 2016-07-16: 150 ug/kg/min via INTRAVENOUS

## 2016-07-16 MED ORDER — FENTANYL CITRATE (PF) 100 MCG/2ML IJ SOLN
INTRAMUSCULAR | Status: AC
  Filled 2016-07-16: qty 2

## 2016-07-16 MED ORDER — LIDOCAINE HCL 2 % IJ SOLN
INTRAMUSCULAR | Status: AC
  Filled 2016-07-16: qty 10

## 2016-07-16 MED ORDER — SODIUM CHLORIDE 0.9 % IV SOLN
INTRAVENOUS | Status: DC
  Administered 2016-07-16: 1000 mL via INTRAVENOUS

## 2016-07-16 MED ORDER — PROPOFOL 10 MG/ML IV BOLUS
INTRAVENOUS | Status: DC | PRN
  Administered 2016-07-16: 90 mg via INTRAVENOUS

## 2016-07-16 MED ORDER — PROPOFOL 500 MG/50ML IV EMUL
INTRAVENOUS | Status: AC
  Filled 2016-07-16: qty 50

## 2016-07-16 MED ORDER — LIDOCAINE HCL (CARDIAC) 20 MG/ML IV SOLN
INTRAVENOUS | Status: DC | PRN
  Administered 2016-07-16: 50 mg via INTRAVENOUS

## 2016-07-16 MED ORDER — FENTANYL CITRATE (PF) 100 MCG/2ML IJ SOLN
INTRAMUSCULAR | Status: DC | PRN
  Administered 2016-07-16: 50 ug via INTRAVENOUS

## 2016-07-16 MED ORDER — MIDAZOLAM HCL 2 MG/2ML IJ SOLN
INTRAMUSCULAR | Status: DC | PRN
  Administered 2016-07-16: 2 mg via INTRAVENOUS

## 2016-07-16 MED ORDER — MIDAZOLAM HCL 2 MG/2ML IJ SOLN
INTRAMUSCULAR | Status: AC
  Filled 2016-07-16: qty 2

## 2016-07-16 NOTE — Anesthesia Post-op Follow-up Note (Cosign Needed)
Anesthesia QCDR form completed.        

## 2016-07-16 NOTE — Transfer of Care (Signed)
Immediate Anesthesia Transfer of Care Note  Patient: Kaylee FlorasRobin Tomasetti  Procedure(s) Performed: Procedure(s): COLONOSCOPY WITH PROPOFOL (N/A)  Patient Location: PACU and Endoscopy Unit  Anesthesia Type:General  Level of Consciousness: sedated  Airway & Oxygen Therapy: Patient Spontanous Breathing and Patient connected to nasal cannula oxygen  Post-op Assessment: Report given to RN and Post -op Vital signs reviewed and stable  Post vital signs: Reviewed and stable  Last Vitals:  Vitals:   07/16/16 0834 07/16/16 0948  BP: (!) 161/82 110/72  Pulse: (!) 59 66  Resp: 16 15  Temp: 36.2 C 36.3 C    Complications: No apparent anesthesia complications

## 2016-07-16 NOTE — Anesthesia Procedure Notes (Signed)
Date/Time: 07/16/2016 9:16 AM Performed by: Stormy FabianURTIS, Kyliegh Jester Pre-anesthesia Checklist: Patient identified, Emergency Drugs available, Suction available and Patient being monitored Patient Re-evaluated:Patient Re-evaluated prior to inductionOxygen Delivery Method: Nasal cannula Intubation Type: IV induction Dental Injury: Teeth and Oropharynx as per pre-operative assessment  Comments: Nasal cannula with etCO2 monitoring

## 2016-07-16 NOTE — Anesthesia Preprocedure Evaluation (Signed)
Anesthesia Evaluation  Patient identified by MRN, date of birth, ID band Patient awake    Reviewed: Allergy & Precautions, H&P , NPO status , Patient's Chart, lab work & pertinent test results, reviewed documented beta blocker date and time   Airway Mallampati: II   Neck ROM: full    Dental  (+) Poor Dentition, Teeth Intact   Pulmonary neg pulmonary ROS, sleep apnea and Continuous Positive Airway Pressure Ventilation , pneumonia, resolved,    Pulmonary exam normal        Cardiovascular hypertension, + angina with exertion negative cardio ROS Normal cardiovascular exam Rhythm:regular Rate:Normal     Neuro/Psych PSYCHIATRIC DISORDERS  Neuromuscular disease negative neurological ROS  negative psych ROS   GI/Hepatic negative GI ROS, Neg liver ROS,   Endo/Other  negative endocrine ROSHypothyroidism   Renal/GU Renal diseasenegative Renal ROS  negative genitourinary   Musculoskeletal   Abdominal   Peds  Hematology negative hematology ROS (+)   Anesthesia Other Findings Past Medical History: No date: Anticoagulant long-term use No date: Anxiety No date: Autoimmune hypothyroidism No date: Chronic hypokalemia No date: Chronic pain No date: Chronic, continuous use of opioids No date: Depression No date: Diverticulosis No date: Fatigue No date: Fibromyalgia No date: Herpes labialis No date: Herpes simplex No date: History of pulmonary embolus (PE) No date: History of pulmonary hypertension No date: Hypertension No date: Lupus (systemic lupus erythematosus) (HCC) No date: Neuromuscular disorder (HCC) No date: OSA on CPAP No date: Osteoporosis No date: Vitamin D deficiency Past Surgical History: No date: CESAREAN SECTION No date: CHOLECYSTECTOMY   Reproductive/Obstetrics negative OB ROS                             Anesthesia Physical Anesthesia Plan  ASA: III  Anesthesia Plan: General    Post-op Pain Management:    Induction:   Airway Management Planned:   Additional Equipment:   Intra-op Plan:   Post-operative Plan:   Informed Consent: I have reviewed the patients History and Physical, chart, labs and discussed the procedure including the risks, benefits and alternatives for the proposed anesthesia with the patient or authorized representative who has indicated his/her understanding and acceptance.   Dental Advisory Given  Plan Discussed with: CRNA  Anesthesia Plan Comments:         Anesthesia Quick Evaluation

## 2016-07-16 NOTE — H&P (Signed)
Wyline Mood MD [address] [place], [place] [address] Phone: [phone] Fax : [phone]  Primary Care Physician:  Dorcas Carrow, DO Primary Gastroenterologist:  Dr. Wyline Mood   Pre-Procedure History & Physical: HPI:  Kaylee Fox is a 56 y.o. female is here for an colonoscopy.   Past Medical History:  Diagnosis Date  . Anticoagulant long-term use   . Anxiety   . Autoimmune hypothyroidism   . Chronic hypokalemia   . Chronic pain   . Chronic, continuous use of opioids   . Depression   . Diverticulosis   . Fatigue   . Fibromyalgia   . Herpes labialis   . Herpes simplex   . History of pulmonary embolus (PE)   . History of pulmonary hypertension   . Hypertension   . Lupus (systemic lupus erythematosus) (HCC)   . Neuromuscular disorder (HCC)   . OSA on CPAP   . Osteoporosis   . Vitamin D deficiency     Past Surgical History:  Procedure Laterality Date  . CESAREAN SECTION    . CHOLECYSTECTOMY      Prior to Admission medications   Medication Sig Start Date End Date Taking? Authorizing Provider  apixaban (ELIQUIS) 5 MG TABS tablet Take 1 tablet (5 mg total) by mouth 2 (two) times daily. 04/19/16  Yes Johnson, Megan P, DO  acetaminophen (TYLENOL) 325 MG tablet Take by mouth. 01/27/15   [provider]  albuterol (PROVENTIL HFA;VENTOLIN HFA) 108 (90 Base) MCG/ACT inhaler Inhale 2 puffs into the lungs every 6 (six) hours as needed for wheezing or shortness of breath. 05/18/16   Johnson, Megan P, DO  amLODipine (NORVASC) 10 MG tablet Take 1 tablet (10 mg total) by mouth daily. 04/19/16   Johnson, Megan P, DO  azaTHIOprine (IMURAN) 50 MG tablet Take 100 mg by mouth daily.    [provider]  baclofen (LIORESAL) 10 MG tablet Take 1 tablet (10 mg total) by mouth daily as needed for muscle spasms. 04/19/16   Olevia Perches P, DO  calcium carbonate (CALCIUM 600) 600 MG TABS tablet Take by mouth. 02/26/14   [provider]  Cholecalciferol  (D3-1000) 1000 units capsule Take 1,000 Units by mouth daily. Takes 2 1000 mg per day    [provider]  fluticasone (FLONASE) 50 MCG/ACT nasal spray Place 2 sprays into both nostrils daily. 05/18/16   Johnson, Megan P, DO  furosemide (LASIX) 40 MG tablet Take 1 tablet (40 mg total) by mouth 2 (two) times daily. 06/25/16   Johnson, Megan P, DO  gabapentin (NEURONTIN) 300 MG capsule Take 2 capsules (600 mg total) by mouth 2 (two) times daily. 04/19/16   Johnson, Megan P, DO  hydroxychloroquine (PLAQUENIL) 200 MG tablet Take 200 mg by mouth. Take 3 200 mg tabs a day    [provider]  hydrOXYzine (ATARAX/VISTARIL) 25 MG tablet Take by mouth.    [provider]  levothyroxine (SYNTHROID, LEVOTHROID) 75 MCG tablet Take 1 tablet (75 mcg total) by mouth daily before breakfast. 06/18/16   Olevia Perches P, DO  Multiple Vitamin (MULTI-VITAMINS) TABS Take by mouth.    [provider]  mupirocin cream (BACTROBAN) 2 % Apply to affected area on arms/legs twice a day as needed. 03/15/12   [provider]  pantoprazole (PROTONIX) 40 MG tablet Take 1 tablet (40 mg total) by mouth daily. 04/19/16   Johnson, Megan P, DO  potassium chloride (KLOR-CON) 20 MEQ packet Take by mouth.    [provider]  predniSONE (DELTASONE) 1 MG tablet Take 3 mg by mouth.  05/06/16 08/04/16  [provider]  [place] KIT 17.5-3.13-1.6 GM/180ML SOLN  07/13/16   [provider]  venlafaxine XR (EFFEXOR-XR) 150 MG 24 hr capsule Take 1 capsule (150 mg total) by mouth daily with breakfast. 06/17/16   Park Liter P, DO  VOLTAREN 1 % GEL  06/05/16   [provider]    Allergies as of 06/08/2016 - Review Complete 05/18/2016  Allergen Reaction Noted  . Penicillins Rash 01/17/2015  . Ciprofloxacin Other (See Comments) 03/05/2007  . Morphine Other (See Comments) 06/29/2006    Family History  Problem Relation Age of Onset  . Arthritis Mother   .  Osteoarthritis Mother   . Lupus Mother   . Arthritis Father   . Osteoarthritis Father   . Lupus Father     Social History   Social History  . Marital status: Legally Separated    Spouse name: N/A  . Number of children: N/A  . Years of education: N/A   Occupational History  . Not on file.   Social History Main Topics  . Smoking status: Never Smoker  . Smokeless tobacco: Never Used  . Alcohol use No  . Drug use: No  . Sexual activity: Yes   Other Topics Concern  . Not on file   Social History Narrative  . No narrative on file    Review of Systems: See HPI, otherwise negative ROS  Physical Exam: BP (!) 161/82   Pulse (!) 59   Temp 97.1 F (36.2 C) (Tympanic)   Resp 16   Ht '5\' 3"'$  (1.6 m)   Wt (!) 327 lb (148.3 kg)   SpO2 100%   BMI 57.93 kg/m  General:   Alert,  pleasant and cooperative in NAD Head:  Normocephalic and atraumatic. Neck:  Supple; no masses or thyromegaly. Lungs:  Clear throughout to auscultation.    Heart:  Regular rate and rhythm. Abdomen:  Soft, nontender and nondistended. Normal bowel sounds, without guarding, and without rebound.   Neurologic:  Alert and  oriented x4;  grossly normal neurologically.  Impression/Plan: Kaylee Fox is here for an colonoscopy to be performed for surveillance due to family history of colon polyps in both parents   Risks, benefits, limitations, and alternatives regarding  colonoscopy have been reviewed with the patient.  Questions have been answered.  All parties agreeable.   Jonathon Bellows, MD  07/16/2016, 9:03 AM

## 2016-07-16 NOTE — Op Note (Signed)
[facility] Patient Name: Kaylee Fox Procedure Date: 07/16/2016 9:02 AM MRN: [id number] Account #: [id number] Date of Birth: [birth year] Admit Type: Outpatient Age: 56 Room: [facility] ENDO ROOM 4 Gender: Female Note Status: Finalized Procedure:            Colonoscopy Indications:          Colon cancer screening in patient at increased risk:                        Family history of 1st-degree relative with colon polyps Providers:            Wyline Mood MD, MD Referring MD:         Dorcas Carrow (Referring MD) Medicines:            Monitored Anesthesia Care Complications:        No immediate complications. Procedure:            Pre-Anesthesia Assessment:                       - Prior to the procedure, a History and Physical was                        performed, and patient medications, allergies and                        sensitivities were reviewed. The patient's tolerance of                        previous anesthesia was reviewed.                       - The risks and benefits of the procedure and the                        sedation options and risks were discussed with the                        patient. All questions were answered and informed                        consent was obtained.                       - ASA Grade Assessment: III - A patient with severe                        systemic disease.                       After obtaining informed consent, the colonoscope was                        passed under direct vision. Throughout the procedure,                        the patient's blood pressure, pulse, and oxygen                        saturations were monitored continuously. The  Colonoscope was introduced through the anus and                        advanced to the the cecum, identified by the                        appendiceal orifice, IC valve and transillumination.                        The colonoscopy was  performed with ease. The patient                        tolerated the procedure well. The quality of the bowel                        preparation was good. Findings:      The perianal and digital rectal examinations were normal.      Multiple small-mouthed diverticula were found in the entire colon.      The exam was otherwise without abnormality on direct and retroflexion       views. Impression:           - Diverticulosis in the entire examined colon.                       - The examination was otherwise normal on direct and                        retroflexion views.                       - No specimens collected. Recommendation:       - Discharge patient to home (with escort).                       - Resume previous diet.                       - Continue present medications.                       - Repeat colonoscopy in 5 years for surveillance. Procedure Code(s):    --- Professional ---                       Z6109, Colorectal cancer screening; colonoscopy on                        individual at high risk Diagnosis Code(s):    --- Professional ---                       Z83.71, Family history of colonic polyps                       K57.30, Diverticulosis of large intestine without                        perforation or abscess without bleeding CPT copyright 2016 American Medical Association. All rights reserved. The codes documented in this report are preliminary and upon coder review may  be revised to meet current compliance requirements. Wyline Mood, MD Wyline Mood MD, MD 07/16/2016 9:42:08 AM This report  has been signed electronically. Number of Addenda: 0 Note Initiated On: 07/16/2016 9:02 AM Scope Withdrawal Time: 0 hours 18 minutes 47 seconds  Total Procedure Duration: 0 hours 22 minutes 21 seconds       [facility]

## 2016-07-19 ENCOUNTER — Encounter: Payer: Self-pay | Admitting: Gastroenterology

## 2016-07-19 NOTE — Anesthesia Postprocedure Evaluation (Signed)
Anesthesia Post Note  Patient: Kaylee FlorasRobin Fox  Procedure(s) Performed: Procedure(s) (LRB): COLONOSCOPY WITH PROPOFOL (N/A)  Patient location during evaluation: PACU Anesthesia Type: General Level of consciousness: awake and alert Pain management: pain level controlled Vital Signs Assessment: post-procedure vital signs reviewed and stable Respiratory status: spontaneous breathing, nonlabored ventilation, respiratory function stable and patient connected to nasal cannula oxygen Cardiovascular status: blood pressure returned to baseline and stable Postop Assessment: no signs of nausea or vomiting Anesthetic complications: no     Last Vitals:  Vitals:   07/16/16 1018 07/16/16 1028  BP: 133/67 111/77  Pulse: 62 63  Resp: 17 18  Temp:      Last Pain:  Vitals:   07/16/16 0948  TempSrc: Tympanic  PainSc: Asleep                 Yevette EdwardsJames G Adams

## 2016-07-20 ENCOUNTER — Encounter (INDEPENDENT_AMBULATORY_CARE_PROVIDER_SITE_OTHER): Payer: Self-pay | Admitting: Vascular Surgery

## 2016-07-20 ENCOUNTER — Ambulatory Visit (INDEPENDENT_AMBULATORY_CARE_PROVIDER_SITE_OTHER): Payer: Medicare PPO | Admitting: Vascular Surgery

## 2016-07-20 VITALS — BP 124/86 | HR 74 | Resp 18 | Ht 62.0 in | Wt 324.0 lb

## 2016-07-20 DIAGNOSIS — I83029 Varicose veins of left lower extremity with ulcer of unspecified site: Secondary | ICD-10-CM

## 2016-07-20 DIAGNOSIS — L97919 Non-pressure chronic ulcer of unspecified part of right lower leg with unspecified severity: Secondary | ICD-10-CM | POA: Diagnosis not present

## 2016-07-20 DIAGNOSIS — L97929 Non-pressure chronic ulcer of unspecified part of left lower leg with unspecified severity: Secondary | ICD-10-CM | POA: Diagnosis not present

## 2016-07-20 DIAGNOSIS — I872 Venous insufficiency (chronic) (peripheral): Secondary | ICD-10-CM | POA: Diagnosis not present

## 2016-07-20 DIAGNOSIS — M7989 Other specified soft tissue disorders: Secondary | ICD-10-CM | POA: Diagnosis not present

## 2016-07-20 NOTE — Progress Notes (Signed)
Subjective:    Patient ID: Kaylee Fox, female    DOB: 1960-07-21, 56 y.o.   MRN: [id number] Chief Complaint  Patient presents with  . Leg Swelling    Unna check   Patient presents for a one month unna wrap / wound check visit. The patient has been undergoing weekly unna boot wraps and elevating her legs as much as possible. She has noticed any improvement in her bilateral lower extremity swelling. She has also noted her ulcerations have healed. Patient continues to lose weight. She has lost approximately 20 pounds. Denies any fever, nausea or vomiting.    Review of Systems  Constitutional: Negative.   HENT: Negative.   Eyes: Negative.   Respiratory: Negative.   Cardiovascular: Positive for leg swelling.  Gastrointestinal: Negative.   Endocrine: Negative.   Genitourinary: Negative.   Musculoskeletal: Negative.   Skin: Negative.   Allergic/Immunologic: Negative.   Neurological: Negative.   Hematological: Negative.   Psychiatric/Behavioral: Negative.        Objective:   Physical Exam  Constitutional: She is oriented to person, place, and time. She appears well-developed and well-nourished. No distress.  HENT:  Head: Normocephalic and atraumatic.  Eyes: Conjunctivae are normal. Pupils are equal, round, and reactive to light.  Neck: Normal range of motion.  Cardiovascular: Normal rate and regular rhythm.   Pulmonary/Chest: Effort normal.  Musculoskeletal: Normal range of motion. She exhibits edema (Moderate Edema Bilaterally).  Neurological: She is alert and oriented to person, place, and time.  Skin: She is not diaphoretic.  Bilateral ulcerations are healed. Skin is now intact.  Psychiatric: She has a normal mood and affect. Her behavior is normal. Judgment and thought content normal.  Vitals reviewed.  BP 124/86 (BP Location: Right Arm)   Pulse 74   Resp 18   Ht '5\' 2"'$  (1.575 m)   Wt (!) 324 lb (147 kg)   BMI 59.26 kg/m   Past Medical History:  Diagnosis Date    . Anticoagulant long-term use   . Anxiety   . Autoimmune hypothyroidism   . Chronic hypokalemia   . Chronic pain   . Chronic, continuous use of opioids   . Depression   . Diverticulosis   . Fatigue   . Fibromyalgia   . Herpes labialis   . Herpes simplex   . History of pulmonary embolus (PE)   . History of pulmonary hypertension   . Hypertension   . Lupus (systemic lupus erythematosus) ([place])   . Neuromuscular disorder ([place])   . OSA on CPAP   . Osteoporosis   . Vitamin D deficiency    Social History   Social History  . Marital status: Legally Separated    Spouse name: N/A  . Number of children: N/A  . Years of education: N/A   Occupational History  . Not on file.   Social History Main Topics  . Smoking status: Never Smoker  . Smokeless tobacco: Never Used  . Alcohol use No  . Drug use: No  . Sexual activity: Yes   Other Topics Concern  . Not on file   Social History Narrative  . No narrative on file   Past Surgical History:  Procedure Laterality Date  . CESAREAN SECTION    . CHOLECYSTECTOMY    . COLONOSCOPY WITH PROPOFOL N/A 07/16/2016   Procedure: COLONOSCOPY WITH PROPOFOL;  Surgeon: Jonathon Bellows, MD;  Location: [facility] ENDOSCOPY;  Service: Endoscopy;  Laterality: N/A;   Family History  Problem Relation Age of Onset  .  Arthritis Mother   . Osteoarthritis Mother   . Lupus Mother   . Arthritis Father   . Osteoarthritis Father   . Lupus Father    Allergies  Allergen Reactions  . Penicillins Rash  . Ciprofloxacin Other (See Comments)  . Morphine Other (See Comments)      Assessment & Plan:  Patient presents for a one month unna wrap / wound check visit. The patient has been undergoing weekly unna boot wraps and elevating her legs as much as possible. She has noticed any improvement in her bilateral lower extremity swelling. She has also noted her ulcerations have healed. Patient continues to lose weight. She has lost approximately 20 pounds. Denies any  fever, nausea or vomiting.   1. Venous ulcers of both lower extremities ([place]) - Improvement Patient is s/p one month of bilateral unna boot therapy for edema and venous ulcerations. Patient has appointment in July for bilateral venous duplex to rule out reflux as a contributing factor Patient will transition over to medical grade one compression stockings, continues to elevate her lower extremity and remain as active as possible.  The patient was instructed to call the office in the interim if any worsening edema or ulcerations to the legs, feet or toes occurs. The patient expresses their understanding.  2. Swelling of limb - Improvement As above  Current Outpatient Prescriptions on File Prior to Visit  Medication Sig Dispense Refill  . acetaminophen (TYLENOL) 325 MG tablet Take by mouth.    Marland Kitchen albuterol (PROVENTIL HFA;VENTOLIN HFA) 108 (90 Base) MCG/ACT inhaler Inhale 2 puffs into the lungs every 6 (six) hours as needed for wheezing or shortness of breath. 1 Inhaler 0  . amLODipine (NORVASC) 10 MG tablet Take 1 tablet (10 mg total) by mouth daily. 90 tablet 1  . apixaban (ELIQUIS) 5 MG TABS tablet Take 1 tablet (5 mg total) by mouth 2 (two) times daily. 180 tablet 1  . azaTHIOprine (IMURAN) 50 MG tablet Take 100 mg by mouth daily.    . baclofen (LIORESAL) 10 MG tablet Take 1 tablet (10 mg total) by mouth daily as needed for muscle spasms. 90 tablet 1  . calcium carbonate (CALCIUM 600) 600 MG TABS tablet Take by mouth.    . Cholecalciferol (D3-1000) 1000 units capsule Take 1,000 Units by mouth daily. Takes 2 1000 mg per day    . fluticasone (FLONASE) 50 MCG/ACT nasal spray Place 2 sprays into both nostrils daily. 16 g 6  . furosemide (LASIX) 40 MG tablet Take 1 tablet (40 mg total) by mouth 2 (two) times daily. 60 tablet 0  . gabapentin (NEURONTIN) 300 MG capsule Take 2 capsules (600 mg total) by mouth 2 (two) times daily. 360 capsule 1  . hydroxychloroquine (PLAQUENIL) 200 MG tablet Take 200  mg by mouth. Take 3 200 mg tabs a day    . hydrOXYzine (ATARAX/VISTARIL) 25 MG tablet Take by mouth.    . levothyroxine (SYNTHROID, LEVOTHROID) 75 MCG tablet Take 1 tablet (75 mcg total) by mouth daily before breakfast. 90 tablet 3  . Multiple Vitamin (MULTI-VITAMINS) TABS Take by mouth.    . mupirocin cream (BACTROBAN) 2 % Apply to affected area on arms/legs twice a day as needed.    . pantoprazole (PROTONIX) 40 MG tablet Take 1 tablet (40 mg total) by mouth daily. 90 tablet 1  . potassium chloride (KLOR-CON) 20 MEQ packet Take by mouth.    . predniSONE (DELTASONE) 1 MG tablet Take 3 mg by mouth.     Marland Kitchen  SUPREP BOWEL PREP KIT 17.5-3.13-1.6 GM/180ML SOLN     . venlafaxine XR (EFFEXOR-XR) 150 MG 24 hr capsule Take 1 capsule (150 mg total) by mouth daily with breakfast. 90 capsule 1  . VOLTAREN 1 % GEL      No current facility-administered medications on file prior to visit.    There are no Patient Instructions on file for this visit. No Follow-up on file.  Duilio Heritage A Jacquel Mccamish, PA-C

## 2016-07-23 ENCOUNTER — Ambulatory Visit (INDEPENDENT_AMBULATORY_CARE_PROVIDER_SITE_OTHER): Payer: Medicare PPO | Admitting: Family Medicine

## 2016-07-23 ENCOUNTER — Encounter: Payer: Self-pay | Admitting: Family Medicine

## 2016-07-23 DIAGNOSIS — L97929 Non-pressure chronic ulcer of unspecified part of left lower leg with unspecified severity: Secondary | ICD-10-CM | POA: Diagnosis not present

## 2016-07-23 DIAGNOSIS — L97919 Non-pressure chronic ulcer of unspecified part of right lower leg with unspecified severity: Secondary | ICD-10-CM

## 2016-07-23 DIAGNOSIS — Z6841 Body Mass Index (BMI) 40.0 and over, adult: Secondary | ICD-10-CM | POA: Diagnosis not present

## 2016-07-23 DIAGNOSIS — I872 Venous insufficiency (chronic) (peripheral): Secondary | ICD-10-CM

## 2016-07-23 NOTE — Progress Notes (Signed)
   BP 106/63 (BP Location: Left Arm, Cuff Size: Normal)   Pulse 66   Temp 98 F (36.7 C)   Wt (!) 325 lb 9.6 oz (147.7 kg)   SpO2 94%   BMI 59.55 kg/m    Subjective:    Patient ID: Kaylee Fox, female    DOB: Feb 05, 1961, 56 y.o.   MRN: [id number]  HPI: Kaylee Fox is a 56 y.o. female  Chief Complaint  Patient presents with  . Wound Check   Wounds healing. Just got out of her una boots. Wearing her compression stockings. Doing well. No concerns.  Continues to follow with bariatric, possibly having surgery in August. Has lost 20lbs! She is very proud of herself. Feeling well. No other concerns.   Relevant past medical, surgical, family and social history reviewed and updated as indicated. Interim medical history since our last visit reviewed. Allergies and medications reviewed and updated.  Review of Systems  Constitutional: Negative.   Respiratory: Negative.   Cardiovascular: Negative.   Musculoskeletal: Negative.   Skin: Negative.   Psychiatric/Behavioral: Negative.     Per HPI unless specifically indicated above     Objective:    BP 106/63 (BP Location: Left Arm, Cuff Size: Normal)   Pulse 66   Temp 98 F (36.7 C)   Wt (!) 325 lb 9.6 oz (147.7 kg)   SpO2 94%   BMI 59.55 kg/m   Wt Readings from Last 3 Encounters:  07/23/16 (!) 325 lb 9.6 oz (147.7 kg)  07/20/16 (!) 324 lb (147 kg)  07/16/16 (!) 327 lb (148.3 kg)    Physical Exam  Constitutional: She is oriented to person, place, and time. She appears well-developed and well-nourished. No distress.  HENT:  Head: Normocephalic and atraumatic.  Right Ear: Hearing normal.  Left Ear: Hearing normal.  Nose: Nose normal.  Eyes: Conjunctivae and lids are normal. Right eye exhibits no discharge. Left eye exhibits no discharge. No scleral icterus.  Cardiovascular: Normal rate, regular rhythm, normal heart sounds and intact distal pulses.  Exam reveals no gallop and no friction rub.   No murmur  heard. Pulmonary/Chest: Effort normal and breath sounds normal. No respiratory distress. She has no wheezes. She has no rales. She exhibits no tenderness.  Musculoskeletal: Normal range of motion.  Neurological: She is alert and oriented to person, place, and time.  Skin: Skin is warm, dry and intact. No rash noted. No erythema. No pallor.  Wounds on her legs healed  Psychiatric: She has a normal mood and affect. Her speech is normal and behavior is normal. Judgment and thought content normal. Cognition and memory are normal.  Nursing note and vitals reviewed.   Results for orders placed or performed in visit on 06/17/16  TSH  Result Value Ref Range   TSH 3.320 0.450 - 4.500 uIU/mL      Assessment & Plan:   Problem List Items Addressed This Visit      Musculoskeletal and Integument   Venous ulcers of both lower extremities (HCC)    Resolving- wounds healed. Continue to follow with vascular. Continue to wear her compression stockings.         Other   Morbid obesity with BMI of 50.0-59.9, adult (HCC)    Continue to follow with bariatrics. Congratulated patient on her weight loss. Call with any concerns.           Follow up plan: Return in about 6 months (around 01/23/2017).

## 2016-07-23 NOTE — Assessment & Plan Note (Signed)
Continue to follow with bariatrics. Congratulated patient on her weight loss. Call with any concerns.

## 2016-07-23 NOTE — Assessment & Plan Note (Signed)
Resolving- wounds healed. Continue to follow with vascular. Continue to wear her compression stockings.

## 2016-08-04 DIAGNOSIS — Z01818 Encounter for other preprocedural examination: Secondary | ICD-10-CM | POA: Diagnosis not present

## 2016-08-04 DIAGNOSIS — K3189 Other diseases of stomach and duodenum: Secondary | ICD-10-CM | POA: Diagnosis not present

## 2016-08-09 DIAGNOSIS — M329 Systemic lupus erythematosus, unspecified: Secondary | ICD-10-CM | POA: Diagnosis not present

## 2016-08-09 DIAGNOSIS — M542 Cervicalgia: Secondary | ICD-10-CM | POA: Insufficient documentation

## 2016-08-09 DIAGNOSIS — Z79899 Other long term (current) drug therapy: Secondary | ICD-10-CM | POA: Diagnosis not present

## 2016-08-09 DIAGNOSIS — Z8739 Personal history of other diseases of the musculoskeletal system and connective tissue: Secondary | ICD-10-CM | POA: Diagnosis not present

## 2016-08-09 DIAGNOSIS — E559 Vitamin D deficiency, unspecified: Secondary | ICD-10-CM | POA: Diagnosis not present

## 2016-08-11 ENCOUNTER — Other Ambulatory Visit: Payer: Self-pay

## 2016-08-12 DIAGNOSIS — M810 Age-related osteoporosis without current pathological fracture: Secondary | ICD-10-CM | POA: Diagnosis not present

## 2016-08-12 DIAGNOSIS — I10 Essential (primary) hypertension: Secondary | ICD-10-CM | POA: Diagnosis not present

## 2016-08-12 DIAGNOSIS — R12 Heartburn: Secondary | ICD-10-CM | POA: Diagnosis not present

## 2016-08-12 DIAGNOSIS — K3189 Other diseases of stomach and duodenum: Secondary | ICD-10-CM | POA: Diagnosis not present

## 2016-08-12 DIAGNOSIS — Z6841 Body Mass Index (BMI) 40.0 and over, adult: Secondary | ICD-10-CM | POA: Diagnosis not present

## 2016-08-12 DIAGNOSIS — K319 Disease of stomach and duodenum, unspecified: Secondary | ICD-10-CM | POA: Diagnosis not present

## 2016-08-12 DIAGNOSIS — K2970 Gastritis, unspecified, without bleeding: Secondary | ICD-10-CM | POA: Diagnosis not present

## 2016-08-12 DIAGNOSIS — R001 Bradycardia, unspecified: Secondary | ICD-10-CM | POA: Diagnosis not present

## 2016-08-13 ENCOUNTER — Telehealth: Payer: Self-pay | Admitting: Family Medicine

## 2016-08-13 MED ORDER — FUROSEMIDE 40 MG PO TABS: 40.0000 mg | ORAL_TABLET | Freq: Two times a day (BID) | ORAL | 3 refills | Status: DC

## 2016-08-13 NOTE — Telephone Encounter (Signed)
Patient would like a refill for LASIX 40 mg [facility] [place].   Please Advise.  Thank you

## 2016-08-20 ENCOUNTER — Ambulatory Visit (INDEPENDENT_AMBULATORY_CARE_PROVIDER_SITE_OTHER): Payer: Medicare PPO | Admitting: Family Medicine

## 2016-08-20 ENCOUNTER — Encounter: Payer: Self-pay | Admitting: Family Medicine

## 2016-08-20 VITALS — BP 131/72 | HR 70 | Temp 98.2°F | Ht 62.5 in | Wt 331.6 lb

## 2016-08-20 DIAGNOSIS — I209 Angina pectoris, unspecified: Secondary | ICD-10-CM | POA: Diagnosis not present

## 2016-08-20 DIAGNOSIS — Z9989 Dependence on other enabling machines and devices: Secondary | ICD-10-CM | POA: Diagnosis not present

## 2016-08-20 DIAGNOSIS — I872 Venous insufficiency (chronic) (peripheral): Secondary | ICD-10-CM

## 2016-08-20 DIAGNOSIS — E876 Hypokalemia: Secondary | ICD-10-CM

## 2016-08-20 DIAGNOSIS — M329 Systemic lupus erythematosus, unspecified: Secondary | ICD-10-CM

## 2016-08-20 DIAGNOSIS — D899 Disorder involving the immune mechanism, unspecified: Secondary | ICD-10-CM

## 2016-08-20 DIAGNOSIS — L97919 Non-pressure chronic ulcer of unspecified part of right lower leg with unspecified severity: Secondary | ICD-10-CM

## 2016-08-20 DIAGNOSIS — Z7901 Long term (current) use of anticoagulants: Secondary | ICD-10-CM

## 2016-08-20 DIAGNOSIS — E782 Mixed hyperlipidemia: Secondary | ICD-10-CM

## 2016-08-20 DIAGNOSIS — Z1239 Encounter for other screening for malignant neoplasm of breast: Secondary | ICD-10-CM

## 2016-08-20 DIAGNOSIS — L97929 Non-pressure chronic ulcer of unspecified part of left lower leg with unspecified severity: Secondary | ICD-10-CM

## 2016-08-20 DIAGNOSIS — I2699 Other pulmonary embolism without acute cor pulmonale: Secondary | ICD-10-CM

## 2016-08-20 DIAGNOSIS — I129 Hypertensive chronic kidney disease with stage 1 through stage 4 chronic kidney disease, or unspecified chronic kidney disease: Secondary | ICD-10-CM

## 2016-08-20 DIAGNOSIS — Z124 Encounter for screening for malignant neoplasm of cervix: Secondary | ICD-10-CM

## 2016-08-20 DIAGNOSIS — Z0000 Encounter for general adult medical examination without abnormal findings: Secondary | ICD-10-CM | POA: Diagnosis not present

## 2016-08-20 DIAGNOSIS — E063 Autoimmune thyroiditis: Secondary | ICD-10-CM

## 2016-08-20 DIAGNOSIS — G4733 Obstructive sleep apnea (adult) (pediatric): Secondary | ICD-10-CM

## 2016-08-20 DIAGNOSIS — Z1231 Encounter for screening mammogram for malignant neoplasm of breast: Secondary | ICD-10-CM | POA: Diagnosis not present

## 2016-08-20 DIAGNOSIS — I2721 Secondary pulmonary arterial hypertension: Secondary | ICD-10-CM | POA: Diagnosis not present

## 2016-08-20 DIAGNOSIS — Z7189 Other specified counseling: Secondary | ICD-10-CM | POA: Insufficient documentation

## 2016-08-20 DIAGNOSIS — G709 Myoneural disorder, unspecified: Secondary | ICD-10-CM

## 2016-08-20 DIAGNOSIS — F331 Major depressive disorder, recurrent, moderate: Secondary | ICD-10-CM | POA: Diagnosis not present

## 2016-08-20 DIAGNOSIS — F1120 Opioid dependence, uncomplicated: Secondary | ICD-10-CM

## 2016-08-20 DIAGNOSIS — Z6841 Body Mass Index (BMI) 40.0 and over, adult: Secondary | ICD-10-CM

## 2016-08-20 DIAGNOSIS — D849 Immunodeficiency, unspecified: Secondary | ICD-10-CM

## 2016-08-20 DIAGNOSIS — I83029 Varicose veins of left lower extremity with ulcer of unspecified site: Secondary | ICD-10-CM

## 2016-08-20 LAB — UA/M W/RFLX CULTURE, ROUTINE
Bilirubin, UA: NEGATIVE
Glucose, UA: NEGATIVE
Nitrite, UA: NEGATIVE
PH UA: 5.5 (ref 5.0–7.5)
RBC, UA: NEGATIVE
Specific Gravity, UA: >1.03 — ABNORMAL HIGH (ref 1.005–1.030)
UUROB: 0.2 mg/dL (ref 0.2–1.0)

## 2016-08-20 LAB — MICROALBUMIN, URINE WAIVED
CREATININE, URINE WAIVED: 300 mg/dL (ref 10–300)
Microalb, Ur Waived: 150 mg/L — ABNORMAL HIGH (ref 0–19)

## 2016-08-20 LAB — MICROSCOPIC EXAMINATION
Bacteria, UA: NONE SEEN
RBC, UA: NONE SEEN /hpf (ref 0–?)

## 2016-08-20 NOTE — Progress Notes (Signed)
BP 131/72 (BP Location: Left Arm, Patient Position: Sitting, Cuff Size: Small)   Pulse 70   Temp 98.2 F (36.8 C)   Ht 5' 2.5" (1.588 m)   Wt (!) 331 lb 9.6 oz (150.4 kg)   SpO2 97%   BMI 59.68 kg/m    Subjective:    Patient ID: Kaylee Fox, female    DOB: Oct 30, 1960, 56 y.o.   MRN: [id number]  HPI: Kaylee Fox is a 56 y.o. female presenting on 08/20/2016 for comprehensive medical examination. Current medical complaints include:  HYPERTENSION / HYPERLIPIDEMIA Satisfied with current treatment? yes Duration of hypertension: chronic BP monitoring frequency: not checking BP medication side effects: no Duration of hyperlipidemia: chronic Cholesterol medication side effects: no Cholesterol supplements: none Past cholesterol medications: none Medication compliance: excellent compliance Aspirin: no Recent stressors: no Recurrent headaches: no Visual changes: no Palpitations: no Dyspnea: no Chest pain: no Lower extremity edema: no Dizzy/lightheaded: no  DEPRESSION Mood status: controlled Satisfied with current treatment?: yes Symptom severity: mild  Duration of current treatment : chronic Side effects: no Medication compliance: excellent compliance Psychotherapy/counseling: no  Depressed mood: yes Anxious mood: no Anhedonia: no Significant weight loss or gain: no Insomnia: yes hard to fall asleep Fatigue: yes Feelings of worthlessness or guilt: no Impaired concentration/indecisiveness: no Suicidal ideations: no Hopelessness: no Crying spells: no Depression screen [facility] 2/9 08/20/2016 06/17/2016 04/19/2016  Decreased Interest 0 0 0  Down, Depressed, Hopeless 0 0 0  PHQ - 2 Score 0 0 0  Altered sleeping 2 - -  Tired, decreased energy 2 - -  Change in appetite 0 - -  Feeling bad or failure about yourself  0 - -  Trouble concentrating 2 - -  Moving slowly or fidgety/restless 0 - -  Suicidal thoughts 0 - -  PHQ-9 Score 6 - -   HYPOTHYROIDISM Thyroid control  status:stable Satisfied with current treatment? yes Medication side effects: no Medication compliance: excellent compliance Recent dose adjustment:no Fatigue: yes Cold intolerance: no Heat intolerance: no Weight gain: no Weight loss: no Constipation: no Diarrhea/loose stools: no Palpitations: no Lower extremity edema: no Anxiety/depressed mood: no  She currently lives with: significant other Menopausal Symptoms: no  Patient Care Team    Relationship Specialty Notifications Start End  Dorcas Carrow, DO PCP - General Family Medicine  02/23/16   Annice Needy, MD Referring Physician Vascular Surgery  08/20/16   Marcille Blanco, MD Referring Physician Surgery  08/20/16   Brayton El, MD Referring Physician Rheumatology  08/20/16    Functional Status Survey: Is the patient deaf or have difficulty hearing?: Yes Does the patient have difficulty seeing, even when wearing glasses/contacts?: No Does the patient have difficulty concentrating, remembering, or making decisions?: No Does the patient have difficulty walking or climbing stairs?: Yes Does the patient have difficulty dressing or bathing?: No Does the patient have difficulty doing errands alone such as visiting a doctor's office or shopping?: No  Fall Risk  08/20/2016 05/18/2016 04/19/2016  Falls in the past year? No No No    Depression Screen Depression screen [facility] 2/9 08/20/2016 06/17/2016 04/19/2016  Decreased Interest 0 0 0  Down, Depressed, Hopeless 0 0 0  PHQ - 2 Score 0 0 0  Altered sleeping 2 - -  Tired, decreased energy 2 - -  Change in appetite 0 - -  Feeling bad or failure about yourself  0 - -  Trouble concentrating 2 - -  Moving slowly or fidgety/restless 0 - -  Suicidal  thoughts 0 - -  PHQ-9 Score 6 - -    Advanced Directives Does patient have a HCPOA?    no Does patient have a living will or MOST form?  no  Past Medical History:  Past Medical History:  Diagnosis Date  . Anticoagulant long-term use     . Anxiety   . Autoimmune hypothyroidism   . Chronic hypokalemia   . Chronic pain   . Chronic, continuous use of opioids   . Depression   . Diverticulosis   . Fatigue   . Fibromyalgia   . Herpes labialis   . Herpes simplex   . History of pulmonary embolus (PE)   . History of pulmonary hypertension   . Hypertension   . Lupus (systemic lupus erythematosus) (HCC)   . Neuromuscular disorder (HCC)   . OSA on CPAP   . Osteoporosis   . Vitamin D deficiency     Surgical History:  Past Surgical History:  Procedure Laterality Date  . CESAREAN SECTION    . CHOLECYSTECTOMY    . COLONOSCOPY WITH PROPOFOL N/A 07/16/2016   Procedure: COLONOSCOPY WITH PROPOFOL;  Surgeon: Wyline Mood, MD;  Location: [facility] ENDOSCOPY;  Service: Endoscopy;  Laterality: N/A;    Medications:  Current Outpatient Prescriptions on File Prior to Visit  Medication Sig  . acetaminophen (TYLENOL) 325 MG tablet Take by mouth.  Marland Kitchen albuterol (PROVENTIL HFA;VENTOLIN HFA) 108 (90 Base) MCG/ACT inhaler Inhale 2 puffs into the lungs every 6 (six) hours as needed for wheezing or shortness of breath.  Marland Kitchen amLODipine (NORVASC) 10 MG tablet Take 1 tablet (10 mg total) by mouth daily.  Marland Kitchen apixaban (ELIQUIS) 5 MG TABS tablet Take 1 tablet (5 mg total) by mouth 2 (two) times daily.  Marland Kitchen azaTHIOprine (IMURAN) 50 MG tablet Take 100 mg by mouth daily.  . baclofen (LIORESAL) 10 MG tablet Take 1 tablet (10 mg total) by mouth daily as needed for muscle spasms.  . calcium carbonate (CALCIUM 600) 600 MG TABS tablet Take by mouth.  . Cholecalciferol (D3-1000) 1000 units capsule Take 1,000 Units by mouth daily. Takes 2 1000 mg per day  . fluticasone (FLONASE) 50 MCG/ACT nasal spray Place 2 sprays into both nostrils daily.  . furosemide (LASIX) 40 MG tablet Take 1 tablet (40 mg total) by mouth 2 (two) times daily.  Marland Kitchen gabapentin (NEURONTIN) 300 MG capsule Take 2 capsules (600 mg total) by mouth 2 (two) times daily.  . hydroxychloroquine (PLAQUENIL)  200 MG tablet Take 200 mg by mouth. Take 3 200 mg tabs a day  . hydrOXYzine (ATARAX/VISTARIL) 25 MG tablet Take by mouth.  . levothyroxine (SYNTHROID, LEVOTHROID) 75 MCG tablet Take 1 tablet (75 mcg total) by mouth daily before breakfast.  . Multiple Vitamin (MULTI-VITAMINS) TABS Take by mouth.  . mupirocin cream (BACTROBAN) 2 % Apply to affected area on arms/legs twice a day as needed.  . pantoprazole (PROTONIX) 40 MG tablet Take 1 tablet (40 mg total) by mouth daily.  . potassium chloride (KLOR-CON) 20 MEQ packet Take by mouth.  . venlafaxine XR (EFFEXOR-XR) 150 MG 24 hr capsule Take 1 capsule (150 mg total) by mouth daily with breakfast.  . VOLTAREN 1 % GEL    No current facility-administered medications on file prior to visit.     Allergies:  Allergies  Allergen Reactions  . Penicillins Rash  . Ciprofloxacin Other (See Comments)  . Morphine Other (See Comments)    Social History:  Social History   Social History  .  Marital status: Legally Separated    Spouse name: N/A  . Number of children: N/A  . Years of education: N/A   Occupational History  . Not on file.   Social History Main Topics  . Smoking status: Never Smoker  . Smokeless tobacco: Never Used  . Alcohol use No  . Drug use: No  . Sexual activity: Yes   Other Topics Concern  . Not on file   Social History Narrative  . No narrative on file   History  Smoking Status  . Never Smoker  Smokeless Tobacco  . Never Used   History  Alcohol Use No    Family History:  Family History  Problem Relation Age of Onset  . Arthritis Mother   . Osteoarthritis Mother   . Lupus Mother   . Arthritis Father   . Osteoarthritis Father   . Lupus Father     Past medical history, surgical history, medications, allergies, family history and social history reviewed with patient today and changes made to appropriate areas of the chart.   Review of Systems  Constitutional: Negative.   HENT: Positive for hearing loss.  Negative for congestion, ear discharge, ear pain, nosebleeds, sinus pain, sore throat and tinnitus.   Eyes: Negative.   Respiratory: Positive for shortness of breath. Negative for cough, hemoptysis, sputum production, wheezing and stridor.   Cardiovascular: Positive for leg swelling. Negative for chest pain, palpitations, orthopnea, claudication and PND.  Gastrointestinal: Positive for constipation and diarrhea. Negative for abdominal pain, blood in stool, heartburn, melena, nausea and vomiting.  Genitourinary: Negative.   Musculoskeletal: Positive for myalgias. Negative for back pain, falls, joint pain and neck pain.  Skin: Negative.  Negative for itching and rash.  Neurological: Negative.   Endo/Heme/Allergies: Positive for polydipsia. Negative for environmental allergies. Bruises/bleeds easily.  Psychiatric/Behavioral: Negative.     All other ROS negative except what is listed above and in the HPI.      Objective:    BP 131/72 (BP Location: Left Arm, Patient Position: Sitting, Cuff Size: Small)   Pulse 70   Temp 98.2 F (36.8 C)   Ht 5' 2.5" (1.588 m)   Wt (!) 331 lb 9.6 oz (150.4 kg)   SpO2 97%   BMI 59.68 kg/m   Wt Readings from Last 3 Encounters:  08/20/16 (!) 331 lb 9.6 oz (150.4 kg)  07/23/16 (!) 325 lb 9.6 oz (147.7 kg)  07/20/16 (!) 324 lb (147 kg)    Physical Exam  Constitutional: She is oriented to person, place, and time. She appears well-developed and well-nourished. No distress.  HENT:  Head: Normocephalic and atraumatic.  Right Ear: Hearing, tympanic membrane, external ear and ear canal normal.  Left Ear: Hearing, tympanic membrane, external ear and ear canal normal.  Nose: Nose normal.  Mouth/Throat: Uvula is midline, oropharynx is clear and moist and mucous membranes are normal. No oropharyngeal exudate.  Eyes: Conjunctivae, EOM and lids are normal. Pupils are equal, round, and reactive to light. Right eye exhibits no discharge. Left eye exhibits no  discharge. No scleral icterus.  Neck: Normal range of motion. Neck supple. No JVD present. No tracheal deviation present. No thyromegaly present.  Cardiovascular: Normal rate, regular rhythm, normal heart sounds and intact distal pulses.  Exam reveals no gallop and no friction rub.   No murmur heard. Pulmonary/Chest: Effort normal and breath sounds normal. No stridor. No respiratory distress. She has no wheezes. She has no rales. She exhibits no tenderness. Right breast exhibits no inverted  nipple, no mass, no nipple discharge, no skin change and no tenderness. Left breast exhibits no inverted nipple, no mass, no nipple discharge, no skin change and no tenderness. Breasts are symmetrical.  Abdominal: Soft. Bowel sounds are normal. She exhibits no distension and no mass. There is no tenderness. There is no rebound and no guarding. Hernia confirmed negative in the right inguinal area and confirmed negative in the left inguinal area.  Genitourinary: Vagina normal and uterus normal. Rectal exam shows guaiac negative stool. No labial fusion. There is no rash, tenderness, lesion or injury on the right labia. There is no rash, tenderness, lesion or injury on the left labia. Uterus is not deviated, not enlarged, not fixed and not tender. Cervix exhibits no motion tenderness, no discharge and no friability. Right adnexum displays no mass, no tenderness and no fullness. Left adnexum displays no mass, no tenderness and no fullness. No erythema, tenderness or bleeding in the vagina. No foreign body in the vagina. No signs of injury around the vagina. No vaginal discharge found.  Musculoskeletal: Normal range of motion. She exhibits no edema, tenderness or deformity.  Lymphadenopathy:    She has no cervical adenopathy.  Neurological: She is alert and oriented to person, place, and time. She has normal reflexes. She displays normal reflexes. No cranial nerve deficit. She exhibits normal muscle tone. Coordination normal.   Skin: Skin is warm, dry and intact. No rash noted. She is not diaphoretic. No erythema. No pallor.  Psychiatric: She has a normal mood and affect. Her speech is normal and behavior is normal. Judgment and thought content normal. Cognition and memory are normal.  Nursing note and vitals reviewed.   6CIT Screen 08/20/2016  What Year? 0 points  What month? 0 points  What time? 0 points  Count back from 20 0 points  Months in reverse 0 points  Repeat phrase 4 points  Total Score 4    Results for orders placed or performed in visit on 08/20/16  Microscopic Examination  Result Value Ref Range   WBC, UA 0-5 0 - 5 /hpf   RBC, UA None seen 0 - 2 /hpf   Epithelial Cells (non renal) 0-10 0 - 10 /hpf   Mucus, UA Present (A) Not Estab.   Bacteria, UA None seen None seen/Few  Comprehensive metabolic panel  Result Value Ref Range   Glucose 81 65 - 99 mg/dL   BUN 13 6 - 24 mg/dL   Creatinine, Ser 1.61 0.57 - 1.00 mg/dL   GFR calc non Af Amer 92 >59 mL/min/1.73   GFR calc Af Amer 106 >59 mL/min/1.73   BUN/Creatinine Ratio 18 9 - 23   Sodium 144 134 - 144 mmol/L   Potassium 4.5 3.5 - 5.2 mmol/L   Chloride 107 (H) 96 - 106 mmol/L   CO2 24 20 - 29 mmol/L   Calcium 9.7 8.7 - 10.2 mg/dL   Total Protein 7.3 6.0 - 8.5 g/dL   Albumin 4.2 3.5 - 5.5 g/dL   Globulin, Total 3.1 1.5 - 4.5 g/dL   Albumin/Globulin Ratio 1.4 1.2 - 2.2   Bilirubin Total 0.4 0.0 - 1.2 mg/dL   Alkaline Phosphatase 92 39 - 117 IU/L   AST 23 0 - 40 IU/L   ALT 26 0 - 32 IU/L  Lipid Panel w/o Chol/HDL Ratio  Result Value Ref Range   Cholesterol, Total 212 (H) 100 - 199 mg/dL   Triglycerides 096 0 - 149 mg/dL   HDL 52 >04 mg/dL  VLDL Cholesterol Cal 21 5 - 40 mg/dL   LDL Calculated 161 (H) 0 - 99 mg/dL  Microalbumin, Urine Waived  Result Value Ref Range   Microalb, Ur Waived 150 (H) 0 - 19 mg/L   Creatinine, Urine Waived 300 10 - 300 mg/dL   Microalb/Creat Ratio 30-300 (H) <30 mg/g  TSH  Result Value Ref Range    TSH 4.100 0.450 - 4.500 uIU/mL  UA/M w/rflx Culture, Routine  Result Value Ref Range   Specific Gravity, UA >1.030 (H) 1.005 - 1.030   pH, UA 5.5 5.0 - 7.5   Color, UA Yellow Yellow   Appearance Ur Cloudy (A) Clear   Leukocytes, UA Trace (A) Negative   Protein, UA 2+ (A) Negative/Trace   Glucose, UA Negative Negative   Ketones, UA Trace (A) Negative   RBC, UA Negative Negative   Bilirubin, UA Negative Negative   Urobilinogen, Ur 0.2 0.2 - 1.0 mg/dL   Nitrite, UA Negative Negative   Microscopic Examination See below:   CBC with Differential/Platelet  Result Value Ref Range   WBC 6.6 3.4 - 10.8 x10E3/uL   RBC 3.95 3.77 - 5.28 x10E6/uL   Hemoglobin 12.5 11.1 - 15.9 g/dL   Hematocrit 09.6 04.5 - 46.6 %   MCV 97 79 - 97 fL   MCH 31.6 26.6 - 33.0 pg   MCHC 32.6 31.5 - 35.7 g/dL   RDW 40.9 81.1 - 91.4 %   Platelets 291 150 - 379 x10E3/uL   Neutrophils 58 Not Estab. %   Lymphs 32 Not Estab. %   Monocytes 7 Not Estab. %   Eos 2 Not Estab. %   Basos 1 Not Estab. %   Neutrophils Absolute 3.8 1.4 - 7.0 x10E3/uL   Lymphocytes Absolute 2.1 0.7 - 3.1 x10E3/uL   Monocytes Absolute 0.5 0.1 - 0.9 x10E3/uL   EOS (ABSOLUTE) 0.2 0.0 - 0.4 x10E3/uL   Basophils Absolute 0.0 0.0 - 0.2 x10E3/uL   Immature Granulocytes 0 Not Estab. %   Immature Grans (Abs) 0.0 0.0 - 0.1 x10E3/uL      Assessment & Plan:   Problem List Items Addressed This Visit      Cardiovascular and Mediastinum   Angina pectoris (HCC)    Will keep BP and cholesterol under good control. Continue diet and exercise. Call with any concerns.       Relevant Orders   CBC with Differential/Platelet   Comprehensive metabolic panel (Completed)   UA/M w/rflx Culture, Routine (Completed)   Pulmonary embolism (HCC)    On anticoagulant. Continue to follow with rheumatology for her lupus.       Pulmonary hypertensive arterial disease (HCC)    Will keep BP and cholesterol under good control. Continue diet and exercise. Call with  any concerns.         Respiratory   OSA on CPAP    Stable on CPAP. Call with any concerns.       Relevant Orders   CBC with Differential/Platelet   Comprehensive metabolic panel (Completed)   UA/M w/rflx Culture, Routine (Completed)     Endocrine   Autoimmune hypothyroidism    Rechecking levels today. Await results.       Relevant Orders   CBC with Differential/Platelet   Comprehensive metabolic panel (Completed)   TSH (Completed)   UA/M w/rflx Culture, Routine (Completed)     Nervous and Auditory   Neuromuscular disorder (HCC)    Continue to follow with rheumatology. Call with any concerns.  Musculoskeletal and Integument   Venous ulcers of both lower extremities (HCC)    Improving. Continue to follow with vascular. Will keep BP and cholesterol under good control. Continue diet and exercise. Call with any concerns.         Genitourinary   Benign hypertensive renal disease    Under good control today. Continue current regimen. Continue to montior. Call with any concerns.       Relevant Orders   CBC with Differential/Platelet   Comprehensive metabolic panel (Completed)   Microalbumin, Urine Waived (Completed)   UA/M w/rflx Culture, Routine (Completed)     Other   Depression    Under fair control. Doesn't want to change meds. Continue to monitor.       Relevant Orders   CBC with Differential/Platelet   Comprehensive metabolic panel (Completed)   UA/M w/rflx Culture, Routine (Completed)   Lupus (systemic lupus erythematosus) (HCC)    Continue to follow with rheumatology. Stable.       Chronic hypokalemia    Rechecking levels today. Await results.       Relevant Orders   CBC with Differential/Platelet   Comprehensive metabolic panel (Completed)   UA/M w/rflx Culture, Routine (Completed)   Continuous opioid dependence (HCC)    Continue to follow with rheumatology. Stable.       Immunosuppression (HCC)    Stable. Continue to follow with  rheumatology.       Long term current use of anticoagulant therapy    Checking CBC today. Await results.       Relevant Orders   CBC with Differential/Platelet   Comprehensive metabolic panel (Completed)   UA/M w/rflx Culture, Routine (Completed)   Morbid obesity with BMI of 50.0-59.9, adult [facility](HCC)    Following with bariatrics- to have her surgery in August.       Mixed hyperlipidemia    Rechecking her levels today. Await results.       Relevant Orders   Lipid Panel w/o Chol/HDL Ratio (Completed)   Advanced directives, counseling/discussion    A voluntary discussion about advance care planning including the explanation and discussion of advance directives was extensively discussed  with the patient.  Explanation about the health care proxy and Living will was reviewed and packet with forms with explanation of how to fill them out was given.  During this discussion, the patient was not able to identify a health care proxy and plans to fill out the paperwork required.  Patient was offered a separate Advance Care Planning visit for further assistance with forms.          Other Visit Diagnoses    Medicare annual wellness visit, subsequent    -  Primary   Preventative care discussed as below. Call with any concerns. Continue to follow with bariatrics. Continue diet and exercise.    Routine general medical examination at a health care facility       Vaccines up to date. Screening labs checked today. Mammogram ordered. Pap done. Colonoscopy up to date. conitnue diet and exercise. Call with concerns.    Screening for breast cancer       Mammogram ordered today.   Relevant Orders   MM DIGITAL SCREENING BILATERAL   Screening for cervical cancer       Pap done today. Await results.    Relevant Orders   IGP, Aptima HPV, rfx 16/18,45      Preventative Services:  Health Risk Assessment and Personalized Prevention Plan: Done today Bone Mass Measurements: N/A Breast  Cancer Screening:  Ordered today CVD Screening: Done today Cervical Cancer Screening: Done today Colon Cancer Screening: up to date Depression Screening: Done today Diabetes Screening: Done today Glaucoma Screening: See your Eye doctor Hepatitis B vaccine: N/A Hepatitis C screening: Declined HIV Screening: Declined Flu Vaccine: Up to date Lung cancer Screening: N/A Obesity Screening: done today Pneumonia Vaccines (2): N/A STI Screening: N/A  Follow up plan: Return in about 6 months (around 02/19/2017).   LABORATORY TESTING:  - Pap smear: pap done  IMMUNIZATIONS:   - Tdap: Tetanus vaccination status reviewed: last tetanus booster within 10 years. - Influenza: Up to date - Pneumovax: Up to date  SCREENING: -Mammogram: Ordered today  - Colonoscopy: Up to date  - Bone Density: Not applicable  -Hearing Test: Ordered today   PATIENT COUNSELING:   Advised to take 1 mg of folate supplement per day if capable of pregnancy.   Sexuality: Discussed sexually transmitted diseases, partner selection, use of condoms, avoidance of unintended pregnancy  and contraceptive alternatives.   Advised to avoid cigarette smoking.  I discussed with the patient that most people either abstain from alcohol or drink within safe limits (<=14/week and <=4 drinks/occasion for males, <=7/weeks and <= 3 drinks/occasion for females) and that the risk for alcohol disorders and other health effects rises proportionally with the number of drinks per week and how often a drinker exceeds daily limits.  Discussed cessation/primary prevention of drug use and availability of treatment for abuse.   Diet: Encouraged to adjust caloric intake to maintain  or achieve ideal body weight, to reduce intake of dietary saturated fat and total fat, to limit sodium intake by avoiding high sodium foods and not adding table salt, and to maintain adequate dietary potassium and calcium preferably from fresh fruits, vegetables, and low-fat dairy  products.    stressed the importance of regular exercise  Injury prevention: Discussed safety belts, safety helmets, smoke detector, smoking near bedding or upholstery.   Dental health: Discussed importance of regular tooth brushing, flossing, and dental visits.    NEXT PREVENTATIVE PHYSICAL DUE IN 1 YEAR. Return in about 6 months (around 02/19/2017).

## 2016-08-20 NOTE — Patient Instructions (Addendum)
Preventative Services:  Health Risk Assessment and Personalized Prevention Plan: Done today Bone Mass Measurements: N/A Breast Cancer Screening: Ordered today CVD Screening: Done today Cervical Cancer Screening: Done today Colon Cancer Screening: up to date Depression Screening: Done today Diabetes Screening: Done today Glaucoma Screening: See your Eye doctor Hepatitis B vaccine: N/A Hepatitis C screening: Declined HIV Screening: Declined Flu Vaccine: Up to date Lung cancer Screening: N/A Obesity Screening: done today Pneumonia Vaccines (2): N/A STI Screening: N/AHealth Maintenance, Female Adopting a healthy lifestyle and getting preventive care can go a long way to promote health and wellness. Talk with your health care provider about what schedule of regular examinations is right for you. This is a good chance for you to check in with your provider about disease prevention and staying healthy. In between checkups, there are plenty of things you can do on your own. Experts have done a lot of research about which lifestyle changes and preventive measures are most likely to keep you healthy. Ask your health care provider for more information. Weight and diet Eat a healthy diet  Be sure to include plenty of vegetables, fruits, low-fat dairy products, and lean protein.  Do not eat a lot of foods high in solid fats, added sugars, or salt.  Get regular exercise. This is one of the most important things you can do for your health. ? Most adults should exercise for at least 150 minutes each week. The exercise should increase your heart rate and make you sweat (moderate-intensity exercise). ? Most adults should also do strengthening exercises at least twice a week. This is in addition to the moderate-intensity exercise.  Maintain a healthy weight  Body mass index (BMI) is a measurement that can be used to identify possible weight problems. It estimates body fat based on height and weight.  Your health care provider can help determine your BMI and help you achieve or maintain a healthy weight.  For females 21 years of age and older: ? A BMI below 18.5 is considered underweight. ? A BMI of 18.5 to 24.9 is normal. ? A BMI of 25 to 29.9 is considered overweight. ? A BMI of 30 and above is considered obese.  Watch levels of cholesterol and blood lipids  You should start having your blood tested for lipids and cholesterol at 55 years of age, then have this test every 5 years.  You may need to have your cholesterol levels checked more often if: ? Your lipid or cholesterol levels are high. ? You are older than 55 years of age. ? You are at high risk for heart disease.  Cancer screening Lung Cancer  Lung cancer screening is recommended for adults 19-34 years old who are at high risk for lung cancer because of a history of smoking.  A yearly low-dose CT scan of the lungs is recommended for people who: ? Currently smoke. ? Have quit within the past 15 years. ? Have at least a 30-pack-year history of smoking. A pack year is smoking an average of one pack of cigarettes a day for 1 year.  Yearly screening should continue until it has been 15 years since you quit.  Yearly screening should stop if you develop a health problem that would prevent you from having lung cancer treatment.  Breast Cancer  Practice breast self-awareness. This means understanding how your breasts normally appear and feel.  It also means doing regular breast self-exams. Let your health care provider know about any changes, no matter how  small.  If you are in your 20s or 30s, you should have a clinical breast exam (CBE) by a health care provider every 1-3 years as part of a regular health exam.  If you are 40 or older, have a CBE every year. Also consider having a breast X-ray (mammogram) every year.  If you have a family history of breast cancer, talk to your health care provider about genetic  screening.  If you are at high risk for breast cancer, talk to your health care provider about having an MRI and a mammogram every year.  Breast cancer gene (BRCA) assessment is recommended for women who have family members with BRCA-related cancers. BRCA-related cancers include: ? Breast. ? Ovarian. ? Tubal. ? Peritoneal cancers.  Results of the assessment will determine the need for genetic counseling and BRCA1 and BRCA2 testing.  Cervical Cancer Your health care provider may recommend that you be screened regularly for cancer of the pelvic organs (ovaries, uterus, and vagina). This screening involves a pelvic examination, including checking for microscopic changes to the surface of your cervix (Pap test). You may be encouraged to have this screening done every 3 years, beginning at age 35.  For women ages 35-65, health care providers may recommend pelvic exams and Pap testing every 3 years, or they may recommend the Pap and pelvic exam, combined with testing for human papilloma virus (HPV), every 5 years. Some types of HPV increase your risk of cervical cancer. Testing for HPV may also be done on women of any age with unclear Pap test results.  Other health care providers may not recommend any screening for nonpregnant women who are considered low risk for pelvic cancer and who do not have symptoms. Ask your health care provider if a screening pelvic exam is right for you.  If you have had past treatment for cervical cancer or a condition that could lead to cancer, you need Pap tests and screening for cancer for at least 20 years after your treatment. If Pap tests have been discontinued, your risk factors (such as having a new sexual partner) need to be reassessed to determine if screening should resume. Some women have medical problems that increase the chance of getting cervical cancer. In these cases, your health care provider may recommend more frequent screening and Pap  tests.  Colorectal Cancer  This type of cancer can be detected and often prevented.  Routine colorectal cancer screening usually begins at 55 years of age and continues through 55 years of age.  Your health care provider may recommend screening at an earlier age if you have risk factors for colon cancer.  Your health care provider may also recommend using home test kits to check for hidden blood in the stool.  A small camera at the end of a tube can be used to examine your colon directly (sigmoidoscopy or colonoscopy). This is done to check for the earliest forms of colorectal cancer.  Routine screening usually begins at age 64.  Direct examination of the colon should be repeated every 5-10 years through 55 years of age. However, you may need to be screened more often if early forms of precancerous polyps or small growths are found.  Skin Cancer  Check your skin from head to toe regularly.  Tell your health care provider about any new moles or changes in moles, especially if there is a change in a mole's shape or color.  Also tell your health care provider if you have a mole  that is larger than the size of a pencil eraser.  Always use sunscreen. Apply sunscreen liberally and repeatedly throughout the day.  Protect yourself by wearing long sleeves, pants, a wide-brimmed hat, and sunglasses whenever you are outside.  Heart disease, diabetes, and high blood pressure  High blood pressure causes heart disease and increases the risk of stroke. High blood pressure is more likely to develop in: ? People who have blood pressure in the high end of the normal range (130-139/85-89 mm Hg). ? People who are overweight or obese. ? People who are African American.  If you are 33-54 years of age, have your blood pressure checked every 3-5 years. If you are 65 years of age or older, have your blood pressure checked every year. You should have your blood pressure measured twice-once when you are at  a hospital or clinic, and once when you are not at a hospital or clinic. Record the average of the two measurements. To check your blood pressure when you are not at a hospital or clinic, you can use: ? An automated blood pressure machine at a pharmacy. ? A home blood pressure monitor.  If you are between 81 years and 63 years old, ask your health care provider if you should take aspirin to prevent strokes.  Have regular diabetes screenings. This involves taking a blood sample to check your fasting blood sugar level. ? If you are at a normal weight and have a low risk for diabetes, have this test once every three years after 55 years of age. ? If you are overweight and have a high risk for diabetes, consider being tested at a younger age or more often. Preventing infection Hepatitis B  If you have a higher risk for hepatitis B, you should be screened for this virus. You are considered at high risk for hepatitis B if: ? You were born in a country where hepatitis B is common. Ask your health care provider which countries are considered high risk. ? Your parents were born in a high-risk country, and you have not been immunized against hepatitis B (hepatitis B vaccine). ? You have HIV or AIDS. ? You use needles to inject street drugs. ? You live with someone who has hepatitis B. ? You have had sex with someone who has hepatitis B. ? You get hemodialysis treatment. ? You take certain medicines for conditions, including cancer, organ transplantation, and autoimmune conditions.  Hepatitis C  Blood testing is recommended for: ? Everyone born from [birth year] through 1964. ? Anyone with known risk factors for hepatitis C.  Sexually transmitted infections (STIs)  You should be screened for sexually transmitted infections (STIs) including gonorrhea and chlamydia if: ? You are sexually active and are younger than 55 years of age. ? You are older than 55 years of age and your health care provider tells  you that you are at risk for this type of infection. ? Your sexual activity has changed since you were last screened and you are at an increased risk for chlamydia or gonorrhea. Ask your health care provider if you are at risk.  If you do not have HIV, but are at risk, it may be recommended that you take a prescription medicine daily to prevent HIV infection. This is called pre-exposure prophylaxis (PrEP). You are considered at risk if: ? You are sexually active and do not regularly use condoms or know the HIV status of your partner(s). ? You take drugs by injection. ? You are  sexually active with a partner who has HIV.  Talk with your health care provider about whether you are at high risk of being infected with HIV. If you choose to begin PrEP, you should first be tested for HIV. You should then be tested every 3 months for as long as you are taking PrEP. Pregnancy  If you are premenopausal and you may become pregnant, ask your health care provider about preconception counseling.  If you may become pregnant, take 400 to 800 micrograms (mcg) of folic acid every day.  If you want to prevent pregnancy, talk to your health care provider about birth control (contraception). Osteoporosis and menopause  Osteoporosis is a disease in which the bones lose minerals and strength with aging. This can result in serious bone fractures. Your risk for osteoporosis can be identified using a bone density scan.  If you are 61 years of age or older, or if you are at risk for osteoporosis and fractures, ask your health care provider if you should be screened.  Ask your health care provider whether you should take a calcium or vitamin D supplement to lower your risk for osteoporosis.  Menopause may have certain physical symptoms and risks.  Hormone replacement therapy may reduce some of these symptoms and risks. Talk to your health care provider about whether hormone replacement therapy is right for  you. Follow these instructions at home:  Schedule regular health, dental, and eye exams.  Stay current with your immunizations.  Do not use any tobacco products including cigarettes, chewing tobacco, or electronic cigarettes.  If you are pregnant, do not drink alcohol.  If you are breastfeeding, limit how much and how often you drink alcohol.  Limit alcohol intake to no more than 1 drink per day for nonpregnant women. One drink equals 12 ounces of beer, 5 ounces of wine, or 1 ounces of hard liquor.  Do not use street drugs.  Do not share needles.  Ask your health care provider for help if you need support or information about quitting drugs.  Tell your health care provider if you often feel depressed.  Tell your health care provider if you have ever been abused or do not feel safe at home. This information is not intended to replace advice given to you by your health care provider. Make sure you discuss any questions you have with your health care provider. Document Released: 09/07/2010 Document Revised: 07/31/2015 Document Reviewed: 11/26/2014 Elsevier Interactive Patient Education  2018 [employer] Maintenance for Postmenopausal Women Menopause is a normal process in which your reproductive ability comes to an end. This process happens gradually over a span of months to years, usually between the ages of 73 and 69. Menopause is complete when you have missed 12 consecutive menstrual periods. It is important to talk with your health care provider about some of the most common conditions that affect postmenopausal women, such as heart disease, cancer, and bone loss (osteoporosis). Adopting a healthy lifestyle and getting preventive care can help to promote your health and wellness. Those actions can also lower your chances of developing some of these common conditions. What should I know about menopause? During menopause, you may experience a number of symptoms, such  as:  Moderate-to-severe hot flashes.  Night sweats.  Decrease in sex drive.  Mood swings.  Headaches.  Tiredness.  Irritability.  Memory problems.  Insomnia.  Choosing to treat or not to treat menopausal changes is an individual decision that you make with your  health care provider. What should I know about hormone replacement therapy and supplements? Hormone therapy products are effective for treating symptoms that are associated with menopause, such as hot flashes and night sweats. Hormone replacement carries certain risks, especially as you become older. If you are thinking about using estrogen or estrogen with progestin treatments, discuss the benefits and risks with your health care provider. What should I know about heart disease and stroke? Heart disease, heart attack, and stroke become more likely as you age. This may be due, in part, to the hormonal changes that your body experiences during menopause. These can affect how your body processes dietary fats, triglycerides, and cholesterol. Heart attack and stroke are both medical emergencies. There are many things that you can do to help prevent heart disease and stroke:  Have your blood pressure checked at least every 1-2 years. High blood pressure causes heart disease and increases the risk of stroke.  If you are 62-41 years old, ask your health care provider if you should take aspirin to prevent a heart attack or a stroke.  Do not use any tobacco products, including cigarettes, chewing tobacco, or electronic cigarettes. If you need help quitting, ask your health care provider.  It is important to eat a healthy diet and maintain a healthy weight. ? Be sure to include plenty of vegetables, fruits, low-fat dairy products, and lean protein. ? Avoid eating foods that are high in solid fats, added sugars, or salt (sodium).  Get regular exercise. This is one of the most important things that you can do for your health. ? Try  to exercise for at least 150 minutes each week. The type of exercise that you do should increase your heart rate and make you sweat. This is known as moderate-intensity exercise. ? Try to do strengthening exercises at least twice each week. Do these in addition to the moderate-intensity exercise.  Know your numbers.Ask your health care provider to check your cholesterol and your blood glucose. Continue to have your blood tested as directed by your health care provider.  What should I know about cancer screening? There are several types of cancer. Take the following steps to reduce your risk and to catch any cancer development as early as possible. Breast Cancer  Practice breast self-awareness. ? This means understanding how your breasts normally appear and feel. ? It also means doing regular breast self-exams. Let your health care provider know about any changes, no matter how small.  If you are 92 or older, have a clinician do a breast exam (clinical breast exam or CBE) every year. Depending on your age, family history, and medical history, it may be recommended that you also have a yearly breast X-ray (mammogram).  If you have a family history of breast cancer, talk with your health care provider about genetic screening.  If you are at high risk for breast cancer, talk with your health care provider about having an MRI and a mammogram every year.  Breast cancer (BRCA) gene test is recommended for women who have family members with BRCA-related cancers. Results of the assessment will determine the need for genetic counseling and BRCA1 and for BRCA2 testing. BRCA-related cancers include these types: ? Breast. This occurs in males or females. ? Ovarian. ? Tubal. This may also be called fallopian tube cancer. ? Cancer of the abdominal or pelvic lining (peritoneal cancer). ? Prostate. ? Pancreatic.  Cervical, Uterine, and Ovarian Cancer Your health care provider may recommend that you be  screened regularly for cancer of the pelvic organs. These include your ovaries, uterus, and vagina. This screening involves a pelvic exam, which includes checking for microscopic changes to the surface of your cervix (Pap test).  For women ages 21-65, health care providers may recommend a pelvic exam and a Pap test every three years. For women ages 34-65, they may recommend the Pap test and pelvic exam, combined with testing for human papilloma virus (HPV), every five years. Some types of HPV increase your risk of cervical cancer. Testing for HPV may also be done on women of any age who have unclear Pap test results.  Other health care providers may not recommend any screening for nonpregnant women who are considered low risk for pelvic cancer and have no symptoms. Ask your health care provider if a screening pelvic exam is right for you.  If you have had past treatment for cervical cancer or a condition that could lead to cancer, you need Pap tests and screening for cancer for at least 20 years after your treatment. If Pap tests have been discontinued for you, your risk factors (such as having a new sexual partner) need to be reassessed to determine if you should start having screenings again. Some women have medical problems that increase the chance of getting cervical cancer. In these cases, your health care provider may recommend that you have screening and Pap tests more often.  If you have a family history of uterine cancer or ovarian cancer, talk with your health care provider about genetic screening.  If you have vaginal bleeding after reaching menopause, tell your health care provider.  There are currently no reliable tests available to screen for ovarian cancer.  Lung Cancer Lung cancer screening is recommended for adults 58-52 years old who are at high risk for lung cancer because of a history of smoking. A yearly low-dose CT scan of the lungs is recommended if you:  Currently  smoke.  Have a history of at least 30 pack-years of smoking and you currently smoke or have quit within the past 15 years. A pack-year is smoking an average of one pack of cigarettes per day for one year.  Yearly screening should:  Continue until it has been 15 years since you quit.  Stop if you develop a health problem that would prevent you from having lung cancer treatment.  Colorectal Cancer  This type of cancer can be detected and can often be prevented.  Routine colorectal cancer screening usually begins at age 38 and continues through age 104.  If you have risk factors for colon cancer, your health care provider may recommend that you be screened at an earlier age.  If you have a family history of colorectal cancer, talk with your health care provider about genetic screening.  Your health care provider may also recommend using home test kits to check for hidden blood in your stool.  A small camera at the end of a tube can be used to examine your colon directly (sigmoidoscopy or colonoscopy). This is done to check for the earliest forms of colorectal cancer.  Direct examination of the colon should be repeated every 5-10 years until age 33. However, if early forms of precancerous polyps or small growths are found or if you have a family history or genetic risk for colorectal cancer, you may need to be screened more often.  Skin Cancer  Check your skin from head to toe regularly.  Monitor any moles. Be sure to tell your  health care provider: ? About any new moles or changes in moles, especially if there is a change in a mole's shape or color. ? If you have a mole that is larger than the size of a pencil eraser.  If any of your family members has a history of skin cancer, especially at a young age, talk with your health care provider about genetic screening.  Always use sunscreen. Apply sunscreen liberally and repeatedly throughout the day.  Whenever you are outside, protect  yourself by wearing long sleeves, pants, a wide-brimmed hat, and sunglasses.  What should I know about osteoporosis? Osteoporosis is a condition in which bone destruction happens more quickly than new bone creation. After menopause, you may be at an increased risk for osteoporosis. To help prevent osteoporosis or the bone fractures that can happen because of osteoporosis, the following is recommended:  If you are 98-50 years old, get at least 1,000 mg of calcium and at least 600 mg of vitamin D per day.  If you are older than age 70 but younger than age 73, get at least 1,200 mg of calcium and at least 600 mg of vitamin D per day.  If you are older than age 83, get at least 1,200 mg of calcium and at least 800 mg of vitamin D per day.  Smoking and excessive alcohol intake increase the risk of osteoporosis. Eat foods that are rich in calcium and vitamin D, and do weight-bearing exercises several times each week as directed by your health care provider. What should I know about how menopause affects my mental health? Depression may occur at any age, but it is more common as you become older. Common symptoms of depression include:  Low or sad mood.  Changes in sleep patterns.  Changes in appetite or eating patterns.  Feeling an overall lack of motivation or enjoyment of activities that you previously enjoyed.  Frequent crying spells.  Talk with your health care provider if you think that you are experiencing depression. What should I know about immunizations? It is important that you get and maintain your immunizations. These include:  Tetanus, diphtheria, and pertussis (Tdap) booster vaccine.  Influenza every year before the flu season begins.  Pneumonia vaccine.  Shingles vaccine.  Your health care provider may also recommend other immunizations. This information is not intended to replace advice given to you by your health care provider. Make sure you discuss any questions you  have with your health care provider. Document Released: 04/16/2005 Document Revised: 09/12/2015 Document Reviewed: 11/26/2014 Elsevier Interactive Patient Education  2018 [employer].

## 2016-08-21 LAB — CBC WITH DIFFERENTIAL/PLATELET
BASOS: 1 %
Basophils Absolute: 0 10*3/uL (ref 0.0–0.2)
EOS (ABSOLUTE): 0.2 10*3/uL (ref 0.0–0.4)
EOS: 2 %
Hematocrit: 38.3 % (ref 34.0–46.6)
Hemoglobin: 12.5 g/dL (ref 11.1–15.9)
IMMATURE GRANS (ABS): 0 10*3/uL (ref 0.0–0.1)
Immature Granulocytes: 0 %
LYMPHS: 32 %
Lymphocytes Absolute: 2.1 10*3/uL (ref 0.7–3.1)
MCH: 31.6 pg (ref 26.6–33.0)
MCHC: 32.6 g/dL (ref 31.5–35.7)
MCV: 97 fL (ref 79–97)
MONOCYTES: 7 %
Monocytes Absolute: 0.5 10*3/uL (ref 0.1–0.9)
NEUTROS ABS: 3.8 10*3/uL (ref 1.4–7.0)
Neutrophils: 58 %
Platelets: 291 10*3/uL (ref 150–379)
RBC: 3.95 x10E6/uL (ref 3.77–5.28)
RDW: 14.7 % (ref 12.3–15.4)
WBC: 6.6 10*3/uL (ref 3.4–10.8)

## 2016-08-21 LAB — COMPREHENSIVE METABOLIC PANEL
ALK PHOS: 92 IU/L (ref 39–117)
ALT: 26 IU/L (ref 0–32)
AST: 23 IU/L (ref 0–40)
Albumin/Globulin Ratio: 1.4 (ref 1.2–2.2)
Albumin: 4.2 g/dL (ref 3.5–5.5)
BUN/Creatinine Ratio: 18 (ref 9–23)
BUN: 13 mg/dL (ref 6–24)
Bilirubin Total: 0.4 mg/dL (ref 0.0–1.2)
CO2: 24 mmol/L (ref 20–29)
CREATININE: 0.74 mg/dL (ref 0.57–1.00)
Calcium: 9.7 mg/dL (ref 8.7–10.2)
Chloride: 107 mmol/L — ABNORMAL HIGH (ref 96–106)
GFR calc Af Amer: 106 mL/min/{1.73_m2} (ref >59)
GFR calc non Af Amer: 92 mL/min/{1.73_m2} (ref >59)
GLOBULIN, TOTAL: 3.1 g/dL (ref 1.5–4.5)
Glucose: 81 mg/dL (ref 65–99)
POTASSIUM: 4.5 mmol/L (ref 3.5–5.2)
SODIUM: 144 mmol/L (ref 134–144)
Total Protein: 7.3 g/dL (ref 6.0–8.5)

## 2016-08-21 LAB — LIPID PANEL W/O CHOL/HDL RATIO
CHOLESTEROL TOTAL: 212 mg/dL — AB (ref 100–199)
HDL: 52 mg/dL (ref >39)
LDL Calculated: 139 mg/dL — ABNORMAL HIGH (ref 0–99)
TRIGLYCERIDES: 105 mg/dL (ref 0–149)
VLDL Cholesterol Cal: 21 mg/dL (ref 5–40)

## 2016-08-21 LAB — TSH: TSH: 4.1 u[IU]/mL (ref 0.450–4.500)

## 2016-08-21 NOTE — Assessment & Plan Note (Signed)
Will keep BP and cholesterol under good control. Continue diet and exercise. Call with any concerns.  °

## 2016-08-21 NOTE — Assessment & Plan Note (Signed)
Rechecking levels today. Await results.  

## 2016-08-21 NOTE — Assessment & Plan Note (Signed)
Rechecking her levels today. Await results.

## 2016-08-21 NOTE — Assessment & Plan Note (Signed)
Under good control today. Continue current regimen. Continue to montior. Call with any concerns.

## 2016-08-21 NOTE — Assessment & Plan Note (Signed)
Continue to follow with rheumatology. Stable.

## 2016-08-21 NOTE — Assessment & Plan Note (Signed)
Improving. Continue to follow with vascular. Will keep BP and cholesterol under good control. Continue diet and exercise. Call with any concerns.

## 2016-08-21 NOTE — Assessment & Plan Note (Signed)
Stable.  Continue to follow with rheumatology

## 2016-08-21 NOTE — Assessment & Plan Note (Signed)
Under fair control. Doesn't want to change meds. Continue to monitor.

## 2016-08-21 NOTE — Assessment & Plan Note (Signed)
Continue to follow with rheumatology. Stable.  

## 2016-08-21 NOTE — Assessment & Plan Note (Signed)
Checking CBC today. Await results.  

## 2016-08-21 NOTE — Assessment & Plan Note (Signed)
Continue to follow with rheumatology. Call with any concerns.  

## 2016-08-21 NOTE — Assessment & Plan Note (Signed)
Stable on CPAP. Call with any concerns.  

## 2016-08-21 NOTE — Assessment & Plan Note (Signed)
On anticoagulant. Continue to follow with rheumatology for her lupus.

## 2016-08-21 NOTE — Assessment & Plan Note (Signed)
A voluntary discussion about advance care planning including the explanation and discussion of advance directives was extensively discussed  with the patient.  Explanation about the health care proxy and Living will was reviewed and packet with forms with explanation of how to fill them out was given.  During this discussion, the patient was not able to identify a health care proxy and plans to fill out the paperwork required.  Patient was offered a separate Advance Care Planning visit for further assistance with forms.    

## 2016-08-21 NOTE — Assessment & Plan Note (Signed)
Following with bariatrics- to have her surgery in August.

## 2016-08-24 ENCOUNTER — Other Ambulatory Visit: Payer: Self-pay | Admitting: Family Medicine

## 2016-08-24 LAB — IGP, APTIMA HPV, RFX 16/18,45
HPV APTIMA: NEGATIVE
PAP Smear Comment: 0

## 2016-08-26 ENCOUNTER — Ambulatory Visit (INDEPENDENT_AMBULATORY_CARE_PROVIDER_SITE_OTHER): Payer: Self-pay | Admitting: Vascular Surgery

## 2016-09-04 ENCOUNTER — Other Ambulatory Visit: Payer: Self-pay | Admitting: Family Medicine

## 2016-09-07 ENCOUNTER — Encounter (INDEPENDENT_AMBULATORY_CARE_PROVIDER_SITE_OTHER): Payer: Medicare PPO

## 2016-09-07 ENCOUNTER — Ambulatory Visit (INDEPENDENT_AMBULATORY_CARE_PROVIDER_SITE_OTHER): Payer: Medicare PPO | Admitting: Vascular Surgery

## 2016-09-10 DIAGNOSIS — M542 Cervicalgia: Secondary | ICD-10-CM | POA: Diagnosis not present

## 2016-09-10 DIAGNOSIS — R938 Abnormal findings on diagnostic imaging of other specified body structures: Secondary | ICD-10-CM | POA: Diagnosis not present

## 2016-09-10 DIAGNOSIS — M5030 Other cervical disc degeneration, unspecified cervical region: Secondary | ICD-10-CM | POA: Diagnosis not present

## 2016-09-10 DIAGNOSIS — R221 Localized swelling, mass and lump, neck: Secondary | ICD-10-CM | POA: Diagnosis not present

## 2016-09-13 DIAGNOSIS — F330 Major depressive disorder, recurrent, mild: Secondary | ICD-10-CM | POA: Diagnosis not present

## 2016-09-13 DIAGNOSIS — H547 Unspecified visual loss: Secondary | ICD-10-CM | POA: Diagnosis not present

## 2016-09-13 DIAGNOSIS — I872 Venous insufficiency (chronic) (peripheral): Secondary | ICD-10-CM | POA: Diagnosis not present

## 2016-09-13 DIAGNOSIS — G4733 Obstructive sleep apnea (adult) (pediatric): Secondary | ICD-10-CM | POA: Diagnosis not present

## 2016-09-13 DIAGNOSIS — K219 Gastro-esophageal reflux disease without esophagitis: Secondary | ICD-10-CM | POA: Diagnosis not present

## 2016-09-13 DIAGNOSIS — G629 Polyneuropathy, unspecified: Secondary | ICD-10-CM | POA: Diagnosis not present

## 2016-09-13 DIAGNOSIS — E039 Hypothyroidism, unspecified: Secondary | ICD-10-CM | POA: Diagnosis not present

## 2016-09-13 DIAGNOSIS — E785 Hyperlipidemia, unspecified: Secondary | ICD-10-CM | POA: Diagnosis not present

## 2016-09-21 ENCOUNTER — Other Ambulatory Visit: Payer: Self-pay | Admitting: Family Medicine

## 2016-09-21 MED ORDER — POTASSIUM CHLORIDE 20 MEQ PO PACK: 20.0000 meq | PACK | Freq: Every day | ORAL | 3 refills | Status: DC

## 2016-09-21 NOTE — Telephone Encounter (Signed)
Lab Results  Component Value Date   K 4.5 08/20/2016

## 2016-09-21 NOTE — Telephone Encounter (Signed)
Pt would like to request a refill for Potassium to be sent to [facility] [place].

## 2016-09-27 ENCOUNTER — Other Ambulatory Visit: Payer: Self-pay | Admitting: Family Medicine

## 2016-09-27 MED ORDER — POTASSIUM CHLORIDE CRYS ER 20 MEQ PO TBCR: 20.0000 meq | EXTENDED_RELEASE_TABLET | Freq: Every day | ORAL | 1 refills | Status: DC

## 2016-09-29 DIAGNOSIS — I10 Essential (primary) hypertension: Secondary | ICD-10-CM | POA: Diagnosis not present

## 2016-09-29 DIAGNOSIS — G4733 Obstructive sleep apnea (adult) (pediatric): Secondary | ICD-10-CM | POA: Diagnosis not present

## 2016-10-19 ENCOUNTER — Telehealth: Payer: Self-pay | Admitting: Family Medicine

## 2016-10-19 MED ORDER — FUROSEMIDE 40 MG PO TABS: 40.0000 mg | ORAL_TABLET | Freq: Two times a day (BID) | ORAL | 1 refills | Status: DC

## 2016-10-19 NOTE — Telephone Encounter (Signed)
Called and left a message for patient to return my call to verify medication location.

## 2016-10-19 NOTE — Telephone Encounter (Addendum)
Patient called back to verify that the pharmacy she would like for her medication to be sent to is [facility] in [place], [place] (NOT the mail order).

## 2016-10-19 NOTE — Telephone Encounter (Signed)
Please send patients medication to [facility] [place]

## 2016-10-19 NOTE — Telephone Encounter (Signed)
Patient needs a refill sent in for her furosemide 40 mg [facility]  Thank You  Zella BallRobin [phone]

## 2016-10-26 ENCOUNTER — Other Ambulatory Visit: Payer: Self-pay | Admitting: Family Medicine

## 2016-10-26 DIAGNOSIS — E063 Autoimmune thyroiditis: Secondary | ICD-10-CM | POA: Diagnosis not present

## 2016-10-26 DIAGNOSIS — M329 Systemic lupus erythematosus, unspecified: Secondary | ICD-10-CM | POA: Diagnosis not present

## 2016-10-26 DIAGNOSIS — E876 Hypokalemia: Secondary | ICD-10-CM | POA: Diagnosis not present

## 2016-10-26 DIAGNOSIS — E2749 Other adrenocortical insufficiency: Secondary | ICD-10-CM | POA: Diagnosis not present

## 2016-10-26 DIAGNOSIS — E041 Nontoxic single thyroid nodule: Secondary | ICD-10-CM | POA: Diagnosis not present

## 2016-10-26 DIAGNOSIS — Z6841 Body Mass Index (BMI) 40.0 and over, adult: Secondary | ICD-10-CM | POA: Diagnosis not present

## 2016-11-01 ENCOUNTER — Ambulatory Visit (INDEPENDENT_AMBULATORY_CARE_PROVIDER_SITE_OTHER): Payer: Medicare PPO | Admitting: Family Medicine

## 2016-11-01 ENCOUNTER — Encounter: Payer: Self-pay | Admitting: Family Medicine

## 2016-11-01 VITALS — BP 126/84 | HR 71 | Temp 97.8°F | Wt 297.0 lb

## 2016-11-01 DIAGNOSIS — G4700 Insomnia, unspecified: Secondary | ICD-10-CM | POA: Diagnosis not present

## 2016-11-01 DIAGNOSIS — J069 Acute upper respiratory infection, unspecified: Secondary | ICD-10-CM | POA: Diagnosis not present

## 2016-11-01 MED ORDER — HYDROXYZINE HCL 25 MG PO TABS: 25.0000 mg | ORAL_TABLET | Freq: Every evening | ORAL | 1 refills | Status: DC | PRN

## 2016-11-01 MED ORDER — AZITHROMYCIN 250 MG PO TABS: ORAL_TABLET | ORAL | 0 refills | Status: DC

## 2016-11-01 NOTE — Progress Notes (Signed)
BP 126/84   Pulse 71   Temp 97.8 F (36.6 C)   Wt 297 lb (134.7 kg)   SpO2 99%   BMI 53.46 kg/m    Subjective:    Patient ID: Kaylee Fox, female    DOB: 02/06/1961, 56 y.o.   MRN: [id number]  HPI: Kaylee Fox is a 56 y.o. female  Chief Complaint  Patient presents with  . URI    x 2 days. productive cough, sinus drainage, head congestion, nasal congestion, sneezing, watery eyes, ear prssure. No fever, no sore throat.  . Gas    and bloating started also over the weekend. No N/V/D.   Sinus pressure, ear pressure, pain behind eyes, facial pain, x 1 week. Denies fever, chills, CP, SOB, N/V/D. Has been trying flonase daily with minimal relief. Lots of sick contacts.   Has not been sleeping well for "a while now". Has tried zquil and melatonin with no relief. Feels she's struggling with both falling asleep and staying asleep. Tries to limit caffeine to only early in the morning. Does have a hx of OSA on CPAP.   Past Medical History:  Diagnosis Date  . Anticoagulant long-term use   . Anxiety   . Autoimmune hypothyroidism   . Chronic hypokalemia   . Chronic pain   . Chronic, continuous use of opioids   . Depression   . Diverticulosis   . Fatigue   . Fibromyalgia   . Herpes labialis   . Herpes simplex   . History of pulmonary embolus (PE)   . History of pulmonary hypertension   . Hypertension   . Lupus (systemic lupus erythematosus) (HCC)   . Neuromuscular disorder (HCC)   . OSA on CPAP   . Osteoporosis   . Vitamin D deficiency    Social History   Social History  . Marital status: Legally Separated    Spouse name: N/A  . Number of children: N/A  . Years of education: N/A   Occupational History  . Not on file.   Social History Main Topics  . Smoking status: Never Smoker  . Smokeless tobacco: Never Used  . Alcohol use No  . Drug use: No  . Sexual activity: Yes   Other Topics Concern  . Not on file   Social History Narrative  . No narrative on file     Relevant past medical, surgical, family and social history reviewed and updated as indicated. Interim medical history since our last visit reviewed. Allergies and medications reviewed and updated.  Review of Systems  Constitutional: Positive for fatigue.  HENT: Positive for congestion, ear pain, sinus pain and sinus pressure.   Psychiatric/Behavioral: Positive for sleep disturbance.   Per HPI unless specifically indicated above     Objective:    BP 126/84   Pulse 71   Temp 97.8 F (36.6 C)   Wt 297 lb (134.7 kg)   SpO2 99%   BMI 53.46 kg/m   Wt Readings from Last 3 Encounters:  11/01/16 297 lb (134.7 kg)  08/20/16 (!) 331 lb 9.6 oz (150.4 kg)  07/23/16 (!) 325 lb 9.6 oz (147.7 kg)    Physical Exam  Constitutional: She is oriented to person, place, and time. She appears well-developed and well-nourished. No distress.  HENT:  Head: Atraumatic.  B/l ethmoidal sinus ttp Nasal mucosa injected Oropharynx erythematous  Eyes: Pupils are equal, round, and reactive to light. Conjunctivae are normal.  Neck: Normal range of motion. Neck supple.  Cardiovascular: Normal rate and  normal heart sounds.   Pulmonary/Chest: Effort normal and breath sounds normal.  Musculoskeletal: Normal range of motion.  Neurological: She is alert and oriented to person, place, and time.  Skin: Skin is warm and dry.  Psychiatric: She has a normal mood and affect. Her behavior is normal.  Nursing note and vitals reviewed.     Assessment & Plan:   Problem List Items Addressed This Visit    None    Visit Diagnoses    Upper respiratory tract infection, unspecified type    -  Primary   Will start zpack given duration and worsening course. Continue flonase and OTC supportive care. F/u if no improvement   Relevant Medications   azithromycin (ZITHROMAX) 250 MG tablet   Insomnia, unspecified type       Has previously tolerated hydroxyzine well, will try utliizing it for sleep aid. Continue melatonin,  discussed sleep hygiene. Use CPAP daily       Follow up plan: Return in about 2 months (around 01/01/2017) for as scheduled.

## 2016-11-03 NOTE — Patient Instructions (Addendum)
Follow up if no improvement 

## 2016-11-09 ENCOUNTER — Ambulatory Visit (INDEPENDENT_AMBULATORY_CARE_PROVIDER_SITE_OTHER): Payer: Medicare PPO | Admitting: Vascular Surgery

## 2016-11-09 ENCOUNTER — Ambulatory Visit (INDEPENDENT_AMBULATORY_CARE_PROVIDER_SITE_OTHER): Payer: Medicare PPO

## 2016-11-09 ENCOUNTER — Encounter (INDEPENDENT_AMBULATORY_CARE_PROVIDER_SITE_OTHER): Payer: Self-pay | Admitting: Vascular Surgery

## 2016-11-09 VITALS — BP 135/94 | HR 62 | Resp 16 | Ht 63.0 in | Wt 305.0 lb

## 2016-11-09 DIAGNOSIS — M7989 Other specified soft tissue disorders: Secondary | ICD-10-CM | POA: Diagnosis not present

## 2016-11-09 DIAGNOSIS — L97919 Non-pressure chronic ulcer of unspecified part of right lower leg with unspecified severity: Secondary | ICD-10-CM

## 2016-11-09 DIAGNOSIS — L97929 Non-pressure chronic ulcer of unspecified part of left lower leg with unspecified severity: Secondary | ICD-10-CM | POA: Diagnosis not present

## 2016-11-09 DIAGNOSIS — I83813 Varicose veins of bilateral lower extremities with pain: Secondary | ICD-10-CM | POA: Diagnosis not present

## 2016-11-09 DIAGNOSIS — I872 Venous insufficiency (chronic) (peripheral): Secondary | ICD-10-CM | POA: Diagnosis not present

## 2016-11-09 NOTE — Assessment & Plan Note (Signed)
Better but not resolved.  Venous disease playing a major role.

## 2016-11-09 NOTE — Patient Instructions (Signed)
Endovenous Ablation Endovenous ablation is a procedure that seals off an abnormally enlarged leg vein (varicose vein). This procedure uses heat from radiofrequency waves or a laser to close off the affected vein. This procedure may be done if the vein is causing pain, swelling, sores on the skin (ulcers), or skin discoloration. Tell a health care provider about:  Any allergies you have.  All medicines you are taking, including vitamins, herbs, eye drops, creams, and over-the-counter medicines.  Any problems you or family members have had with anesthetic medicines.  Any blood disorders you have.  Any surgeries you have had.  Any medical conditions you have.  Whether you are pregnant or may be pregnant. What are the risks? Generally, this is a safe procedure. However, problems may occur, including:  Infection.  Bleeding.  Allergic reactions to medicines.  Damage to other structures.  Numbness or tingling along the leg. This is uncommon, and it is usually temporary.  Vein swelling. This is usually temporary.  Blood clots that form in a deep vein of the leg (deep vein thrombosis, or DVT) and can travel to the lungs (pulmonary embolism, or PE). This is very rare.  What happens before the procedure?  You may have blood tests to make sure that your blood can clot normally.  Ask your health care provider about: ? Changing or stopping your regular medicines. This is especially important if you are taking diabetes medicines or blood thinners. ? Taking medicines such as aspirin and ibuprofen. These medicines can thin your blood. Do not take these medicines before your procedure if your health care provider instructs you not to.  Follow instructions from your health care provider about eating or drinking restrictions.  Plan to have someone take you home after the procedure. What happens during the procedure?  You will lie on an exam table.  To reduce your risk of  infection: ? Your health care team will wash or sanitize their hands. ? Your skin will be washed with soap.  Hair may be removed from the surgical area.  Your health care provider will use an imaging tool that uses sound waves (ultrasonogram) to show images of your leg veins.  You will be given medicine to numb the area (local anesthetic).  A small cut (incision) will be made near the area that will be treated. A narrow tube (catheter) will be slipped through the incision and into the vein.  Small sensors (electrodes or laser fibers) will be passed through the catheter and into the vein.  Radiofrequency or laser energy will be sent through the sensors to burn the vein. This seals off the vein.  The electrodes, laser fibers, and catheter will be removed from the vein.  A bandage (dressing) will be placed over the incision. The procedure may vary among health care providers and hospitals. What happens after the procedure?  You may have to wear compression stockings. These stockings help to prevent blood clots and reduce swelling in your legs.  You will be encouraged to walk around immediately after the procedure. This information is not intended to replace advice given to you by your health care provider. Make sure you discuss any questions you have with your health care provider. Document Released: 02/11/2011 Document Revised: 07/31/2015 Document Reviewed: 05/28/2014 Elsevier Interactive Patient Education  2018 [employer]vier Inc.  

## 2016-11-09 NOTE — Progress Notes (Signed)
Patient ID: Kaylee Fox, female   DOB: 1960-10-16, 56 y.o.   MRN: [id number]  Chief Complaint  Patient presents with  . Follow-up    Reflux u/s follow up    HPI Kaylee Fox is a 56 y.o. female.  Patient returns in follow up of their venous disease.  They have done their best to comply with the prescribed conservative therapies of compression stockings, leg elevation, exercise, and still requires anti-inflammatories for discomfort and has symptoms that are persistent and bothersome on a daily basis, affecting their activities of daily living and normal activities. She has lost about 30 pounds which has helped her lower extremity symptoms, but has far from relieved them. The venous reflux study demonstrates right great saphenous vein reflux as well as left anterior accessory saphenous vein reflux.   Past Medical History:  Diagnosis Date  . Anticoagulant long-term use   . Anxiety   . Autoimmune hypothyroidism   . Chronic hypokalemia   . Chronic pain   . Chronic, continuous use of opioids   . Depression   . Diverticulosis   . Fatigue   . Fibromyalgia   . Herpes labialis   . Herpes simplex   . History of pulmonary embolus (PE)   . History of pulmonary hypertension   . Hypertension   . Lupus (systemic lupus erythematosus) (HCC)   . Neuromuscular disorder (HCC)   . OSA on CPAP   . Osteoporosis   . Vitamin D deficiency          Past Surgical History:  Procedure Laterality Date  . CESAREAN SECTION    . CHOLECYSTECTOMY           Family History  Problem Relation Age of Onset  . Arthritis Mother   . Osteoarthritis Mother   . Lupus Mother   . Arthritis Father   . Osteoarthritis Father   . Lupus Father   No bleeding or clotting disorders  Social History     Social History  Substance Use Topics  . Smoking status: Never Smoker  . Smokeless tobacco: Never Used  . Alcohol use No  No IV drug use      Allergies  Allergen  Reactions  . Penicillins Rash  . Ciprofloxacin Other (See Comments)  . Morphine Other (See Comments)          Current Outpatient Prescriptions  Medication Sig Dispense Refill  . acetaminophen (TYLENOL) 325 MG tablet Take by mouth.    Marland Kitchen albuterol (PROVENTIL HFA;VENTOLIN HFA) 108 (90 Base) MCG/ACT inhaler Inhale 2 puffs into the lungs every 6 (six) hours as needed for wheezing or shortness of breath. 1 Inhaler 0  . amLODipine (NORVASC) 10 MG tablet Take 1 tablet (10 mg total) by mouth daily. 90 tablet 1  . apixaban (ELIQUIS) 5 MG TABS tablet Take 1 tablet (5 mg total) by mouth 2 (two) times daily. 180 tablet 1  . azaTHIOprine (IMURAN) 50 MG tablet Take 100 mg by mouth daily.    . baclofen (LIORESAL) 10 MG tablet Take 1 tablet (10 mg total) by mouth daily as needed for muscle spasms. 90 tablet 1  . calcium carbonate (CALCIUM 600) 600 MG TABS tablet Take by mouth.    . Cholecalciferol (D3-1000) 1000 units capsule Take 1,000 Units by mouth daily. Takes 2 1000 mg per day    . fluticasone (FLONASE) 50 MCG/ACT nasal spray Place 2 sprays into both nostrils daily. 16 g 6  . furosemide (LASIX) 40 MG tablet Take 1 tablet (  40 mg total) by mouth 2 (two) times daily. 180 tablet 1  . gabapentin (NEURONTIN) 300 MG capsule Take 2 capsules (600 mg total) by mouth 2 (two) times daily. 360 capsule 1  . hydroxychloroquine (PLAQUENIL) 200 MG tablet Take 200 mg by mouth. Take 3 200 mg tabs a day    . hydrOXYzine (ATARAX/VISTARIL) 25 MG tablet Take by mouth.    . levothyroxine (SYNTHROID, LEVOTHROID) 75 MCG tablet Take 1 tablet (75 mcg total) by mouth daily before breakfast. 90 tablet 3  . Multiple Vitamin (MULTI-VITAMINS) TABS Take by mouth.    . mupirocin cream (BACTROBAN) 2 % Apply to affected area on arms/legs twice a day as needed.    . pantoprazole (PROTONIX) 40 MG tablet Take 1 tablet (40 mg total) by mouth daily. 90 tablet 1  . potassium chloride (KLOR-CON) 20 MEQ packet Take by mouth.     . predniSONE (DELTASONE) 1 MG tablet Take 3 mg by mouth.     . venlafaxine XR (EFFEXOR-XR) 150 MG 24 hr capsule Take 1 capsule (150 mg total) by mouth daily with breakfast. 90 capsule 1  . VOLTAREN 1 % GEL      No current facility-administered medications for this visit.       REVIEW OF SYSTEMS (Negative unless checked)  Constitutional: [] Weight loss  [] Fever  [] Chills Cardiac: [] Chest pain   [] Chest pressure   [] Palpitations   [] Shortness of breath when laying flat   [] Shortness of breath at rest   [] Shortness of breath with exertion. Vascular:  [x] Pain in legs with walking   [] Pain in legs at rest   [] Pain in legs when laying flat   [] Claudication   [] Pain in feet when walking  [] Pain in feet at rest  [] Pain in feet when laying flat   [] History of DVT   [] Phlebitis   [x] Swelling in legs   [x] Varicose veins   [] Non-healing ulcers Pulmonary:   [] Uses home oxygen   [] Productive cough   [] Hemoptysis   [] Wheeze  [] COPD   [] Asthma Neurologic:  [] Dizziness  [] Blackouts   [] Seizures   [] History of stroke   [] History of TIA  [] Aphasia   [] Temporary blindness   [] Dysphagia   [] Weakness or numbness in arms   [] Weakness or numbness in legs Musculoskeletal:  [x] Arthritis   [] Joint swelling   [] Joint pain   [] Low back pain Hematologic:  [] Easy bruising  [] Easy bleeding   [] Hypercoagulable state   [] Anemic  [] Hepatitis Gastrointestinal:  [] Blood in stool   [] Vomiting blood  [] Gastroesophageal reflux/heartburn   [] Abdominal pain Genitourinary:  [] Chronic kidney disease   [] Difficult urination  [] Frequent urination  [] Burning with urination   [] Hematuria Skin:  [] Rashes   [] Ulcers   [] Wounds Psychological:  [] History of anxiety   []  History of major depression.    Physical Exam BP (!) 135/94 (BP Location: Left Wrist)   Pulse 62   Resp 16   Ht 5\' 3"  (1.6 m)   Wt (!) 138.3 kg (305 lb)   BMI 54.03 kg/m  Gen:  WD/WN, NAD Head: [place]/AT, No temporalis wasting.  Ear/Nose/Throat: Hearing  grossly intact, dentition good Eyes: Sclera non-icteric. Conjunctiva clear Neck: Supple. Trachea midline Pulmonary:  Good air movement, no use of accessory muscles, respirations not labored.  Cardiac: RRR, No JVD Vascular: Varicosities diffuse and measuring up to 2 mm in the right lower extremity        Varicosities extensive and measuring up to 4 mm in the left lower extremity Vessel Right Left  Radial Palpable Palpable                                    Musculoskeletal: M/S 5/5 throughout.   Trace RLE edema.  1 + LLE edema Neurologic: Sensation grossly intact in extremities.  Symmetrical.  Speech is fluent.  Psychiatric: Judgment intact, Mood & affect appropriate for pt's clinical situation. Dermatologic: No rashes or ulcers noted.  No cellulitis or open wounds.  Radiology No results found.  Labs Recent Results (from the past 2160 hour(s))  Microalbumin, Urine Waived     Status: Abnormal   Collection Time: 08/20/16  2:35 PM  Result Value Ref Range   Microalb, Ur Waived 150 (H) 0 - 19 mg/L   Creatinine, Urine Waived 300 10 - 300 mg/dL   Microalb/Creat Ratio 30-300 (H) <30 mg/g    Comment:                              Abnormal:       30 - 300                         High Abnormal:           >300   UA/M w/rflx Culture, Routine     Status: Abnormal   Collection Time: 08/20/16  2:35 PM  Result Value Ref Range   Specific Gravity, UA >1.030 (H) 1.005 - 1.030   pH, UA 5.5 5.0 - 7.5   Color, UA Yellow Yellow   Appearance Ur Cloudy (A) Clear   Leukocytes, UA Trace (A) Negative   Protein, UA 2+ (A) Negative/Trace   Glucose, UA Negative Negative   Ketones, UA Trace (A) Negative   RBC, UA Negative Negative   Bilirubin, UA Negative Negative   Urobilinogen, Ur 0.2 0.2 - 1.0 mg/dL   Nitrite, UA Negative Negative   Microscopic Examination See below:   Microscopic Examination     Status: Abnormal   Collection Time: 08/20/16  2:35 PM  Result Value Ref Range   WBC, UA 0-5 0 - 5  /hpf   RBC, UA None seen 0 - 2 /hpf   Epithelial Cells (non renal) 0-10 0 - 10 /hpf   Mucus, UA Present (A) Not Estab.   Bacteria, UA None seen None seen/Few  Comprehensive metabolic panel     Status: Abnormal   Collection Time: 08/20/16  2:37 PM  Result Value Ref Range   Glucose 81 65 - 99 mg/dL   BUN 13 6 - 24 mg/dL   Creatinine, Ser 9.14 0.57 - 1.00 mg/dL   GFR calc non Af Amer 92 >59 mL/min/1.73   GFR calc Af Amer 106 >59 mL/min/1.73   BUN/Creatinine Ratio 18 9 - 23   Sodium 144 134 - 144 mmol/L   Potassium 4.5 3.5 - 5.2 mmol/L   Chloride 107 (H) 96 - 106 mmol/L   CO2 24 20 - 29 mmol/L    Comment:               **Please note reference interval change**   Calcium 9.7 8.7 - 10.2 mg/dL   Total Protein 7.3 6.0 - 8.5 g/dL   Albumin 4.2 3.5 - 5.5 g/dL   Globulin, Total 3.1 1.5 - 4.5 g/dL   Albumin/Globulin Ratio 1.4 1.2 - 2.2   Bilirubin Total 0.4 0.0 -  1.2 mg/dL   Alkaline Phosphatase 92 39 - 117 IU/L   AST 23 0 - 40 IU/L   ALT 26 0 - 32 IU/L  Lipid Panel w/o Chol/HDL Ratio     Status: Abnormal   Collection Time: 08/20/16  2:37 PM  Result Value Ref Range   Cholesterol, Total 212 (H) 100 - 199 mg/dL   Triglycerides 409 0 - 149 mg/dL   HDL 52 >81 mg/dL   VLDL Cholesterol Cal 21 5 - 40 mg/dL   LDL Calculated 191 (H) 0 - 99 mg/dL  TSH     Status: None   Collection Time: 08/20/16  2:37 PM  Result Value Ref Range   TSH 4.100 0.450 - 4.500 uIU/mL  CBC with Differential/Platelet     Status: None   Collection Time: 08/20/16  2:37 PM  Result Value Ref Range   WBC 6.6 3.4 - 10.8 x10E3/uL   RBC 3.95 3.77 - 5.28 x10E6/uL   Hemoglobin 12.5 11.1 - 15.9 g/dL   Hematocrit 47.8 29.5 - 46.6 %   MCV 97 79 - 97 fL   MCH 31.6 26.6 - 33.0 pg   MCHC 32.6 31.5 - 35.7 g/dL   RDW 62.1 30.8 - 65.7 %   Platelets 291 150 - 379 x10E3/uL   Neutrophils 58 Not Estab. %   Lymphs 32 Not Estab. %   Monocytes 7 Not Estab. %   Eos 2 Not Estab. %   Basos 1 Not Estab. %   Neutrophils Absolute 3.8 1.4  - 7.0 x10E3/uL   Lymphocytes Absolute 2.1 0.7 - 3.1 x10E3/uL   Monocytes Absolute 0.5 0.1 - 0.9 x10E3/uL   EOS (ABSOLUTE) 0.2 0.0 - 0.4 x10E3/uL   Basophils Absolute 0.0 0.0 - 0.2 x10E3/uL   Immature Granulocytes 0 Not Estab. %   Immature Grans (Abs) 0.0 0.0 - 0.1 x10E3/uL  IGP, Aptima HPV, rfx 16/18,45     Status: None   Collection Time: 08/20/16  4:00 PM  Result Value Ref Range   DIAGNOSIS: Comment     Comment: NEGATIVE FOR INTRAEPITHELIAL LESION AND MALIGNANCY. THIS SPECIMEN WAS RESCREENED AS PART OF OUR QUALITY CONTROL PROGRAM.    Specimen adequacy: Comment     Comment: Satisfactory for evaluation. No endocervical component is identified. The absence of an endocervical component was confirmed by an additional screening evaluation.    Clinician Provided ICD10 Comment     Comment: Z12.4   Performed by: Comment     Comment: Almira Coaster, Cytotechnologist (ASCP)   QC reviewed by: Comment     Comment: Threasa Heads, Cytotechnologist (ASCP)   PAP Smear Comment .    Note: Comment     Comment: The Pap smear is a screening test designed to aid in the detection of premalignant and malignant conditions of the uterine cervix.  It is not a diagnostic procedure and should not be used as the sole means of detecting cervical cancer.  Both false-positive and false-negative reports do occur.    Test Methodology Comment     Comment: This liquid based ThinPrep(R) pap test was screened with the use of an image guided system.    HPV Aptima Negative Negative    Comment: This test detects fourteen high-risk HPV types (16/18/31/33/35/39/45/ 51/52/56/58/59/66/68) without differentiation.     Assessment/Plan:  Swelling of limb Better but not resolved.  Venous disease playing a major role.  Varicose veins of both lower extremities with pain    The patient has done their best to comply with  conservative therapy of 20-30 mm Hg compression stockings, leg elevation, exercise, and  anti-inflammatories as needed for discomfort.  Despite this, they continue to have daily and persistent symptoms from their venous disease.  A venous reflux study demonstrates Right great saphenous vein reflux and left anterior accessory saphenous vein reflux.  As such, the patient is likely to benefit from endovenous laser ablation of the right great saphenous vein as well as the left anterior accessory saphenous vein if that is technically feasible. If the left anterior accessory saphenous vein is too tortuous to be treated with laser ablation, foam sclerotherapy would be performed.  Risks and benefits of the procedure including bleeding, infection, recanalization, DVT, and need for further therapy for residual varicosities were discussed.  The patient voices their understanding and is agreeable to proceed.     Festus BarrenJason Cesily Cuoco 11/09/2016, 3:28 PM

## 2016-11-10 ENCOUNTER — Other Ambulatory Visit: Payer: Self-pay | Admitting: Family Medicine

## 2016-11-26 DIAGNOSIS — Z79899 Other long term (current) drug therapy: Secondary | ICD-10-CM | POA: Diagnosis not present

## 2016-11-26 DIAGNOSIS — M329 Systemic lupus erythematosus, unspecified: Secondary | ICD-10-CM | POA: Diagnosis not present

## 2016-11-26 DIAGNOSIS — E559 Vitamin D deficiency, unspecified: Secondary | ICD-10-CM | POA: Diagnosis not present

## 2016-11-26 DIAGNOSIS — M5030 Other cervical disc degeneration, unspecified cervical region: Secondary | ICD-10-CM | POA: Diagnosis not present

## 2016-11-26 DIAGNOSIS — M542 Cervicalgia: Secondary | ICD-10-CM | POA: Diagnosis not present

## 2016-11-26 DIAGNOSIS — E041 Nontoxic single thyroid nodule: Secondary | ICD-10-CM | POA: Diagnosis not present

## 2016-11-26 DIAGNOSIS — Z8739 Personal history of other diseases of the musculoskeletal system and connective tissue: Secondary | ICD-10-CM | POA: Diagnosis not present

## 2016-12-01 DIAGNOSIS — M542 Cervicalgia: Secondary | ICD-10-CM | POA: Diagnosis not present

## 2016-12-01 DIAGNOSIS — M5030 Other cervical disc degeneration, unspecified cervical region: Secondary | ICD-10-CM | POA: Diagnosis not present

## 2016-12-02 ENCOUNTER — Telehealth (INDEPENDENT_AMBULATORY_CARE_PROVIDER_SITE_OTHER): Payer: Self-pay | Admitting: Vascular Surgery

## 2016-12-02 NOTE — Telephone Encounter (Signed)
Patient just wanted to know she will be numbed up for the laser procedure and I inform her that lidocaine will be use

## 2016-12-02 NOTE — Telephone Encounter (Signed)
I left a message on patient voicemail to return a call to the office

## 2016-12-02 NOTE — Telephone Encounter (Signed)
Patient would like to speak with someone about the specifics of the procedure and what it consist of. Please call and advise. Then transfer back to [place] for scheduling

## 2016-12-03 DIAGNOSIS — M542 Cervicalgia: Secondary | ICD-10-CM | POA: Diagnosis not present

## 2016-12-03 DIAGNOSIS — M5030 Other cervical disc degeneration, unspecified cervical region: Secondary | ICD-10-CM | POA: Diagnosis not present

## 2016-12-08 DIAGNOSIS — M542 Cervicalgia: Secondary | ICD-10-CM | POA: Diagnosis not present

## 2016-12-08 DIAGNOSIS — M5030 Other cervical disc degeneration, unspecified cervical region: Secondary | ICD-10-CM | POA: Diagnosis not present

## 2016-12-10 DIAGNOSIS — M542 Cervicalgia: Secondary | ICD-10-CM | POA: Diagnosis not present

## 2016-12-10 DIAGNOSIS — M5030 Other cervical disc degeneration, unspecified cervical region: Secondary | ICD-10-CM | POA: Diagnosis not present

## 2016-12-31 DIAGNOSIS — T380X5A Adverse effect of glucocorticoids and synthetic analogues, initial encounter: Secondary | ICD-10-CM | POA: Diagnosis not present

## 2016-12-31 DIAGNOSIS — E273 Drug-induced adrenocortical insufficiency: Secondary | ICD-10-CM | POA: Diagnosis not present

## 2016-12-31 DIAGNOSIS — M329 Systemic lupus erythematosus, unspecified: Secondary | ICD-10-CM | POA: Diagnosis not present

## 2016-12-31 DIAGNOSIS — E041 Nontoxic single thyroid nodule: Secondary | ICD-10-CM | POA: Diagnosis not present

## 2016-12-31 DIAGNOSIS — E063 Autoimmune thyroiditis: Secondary | ICD-10-CM | POA: Diagnosis not present

## 2016-12-31 DIAGNOSIS — E2749 Other adrenocortical insufficiency: Secondary | ICD-10-CM | POA: Diagnosis not present

## 2016-12-31 DIAGNOSIS — Z7901 Long term (current) use of anticoagulants: Secondary | ICD-10-CM | POA: Diagnosis not present

## 2017-01-03 ENCOUNTER — Other Ambulatory Visit: Payer: Self-pay | Admitting: Family Medicine

## 2017-01-07 ENCOUNTER — Ambulatory Visit (INDEPENDENT_AMBULATORY_CARE_PROVIDER_SITE_OTHER): Payer: Medicare PPO | Admitting: Family Medicine

## 2017-01-07 ENCOUNTER — Encounter: Payer: Self-pay | Admitting: Family Medicine

## 2017-01-07 VITALS — BP 129/83 | HR 70 | Temp 98.3°F | Wt 300.4 lb

## 2017-01-07 DIAGNOSIS — Z01818 Encounter for other preprocedural examination: Secondary | ICD-10-CM

## 2017-01-07 DIAGNOSIS — F419 Anxiety disorder, unspecified: Secondary | ICD-10-CM | POA: Diagnosis not present

## 2017-01-07 DIAGNOSIS — E063 Autoimmune thyroiditis: Secondary | ICD-10-CM | POA: Diagnosis not present

## 2017-01-07 DIAGNOSIS — E559 Vitamin D deficiency, unspecified: Secondary | ICD-10-CM | POA: Diagnosis not present

## 2017-01-07 DIAGNOSIS — F331 Major depressive disorder, recurrent, moderate: Secondary | ICD-10-CM

## 2017-01-07 DIAGNOSIS — M329 Systemic lupus erythematosus, unspecified: Secondary | ICD-10-CM

## 2017-01-07 DIAGNOSIS — I129 Hypertensive chronic kidney disease with stage 1 through stage 4 chronic kidney disease, or unspecified chronic kidney disease: Secondary | ICD-10-CM | POA: Diagnosis not present

## 2017-01-07 NOTE — Assessment & Plan Note (Signed)
Under good control. Continue current regimen. Continue to monitor. Call with any concerns. 

## 2017-01-07 NOTE — Assessment & Plan Note (Signed)
Having labs and EKG done at the hospital. OK to stop her eliquis 1 day before surgery with bridging while in the hospital and restart 1 day after. Cleared pending anesthesiology evaluation.

## 2017-01-07 NOTE — Assessment & Plan Note (Signed)
Rechecking levels today. Await results. Call with any concerns.  

## 2017-01-07 NOTE — Assessment & Plan Note (Signed)
Lupus anticoagulant syndrome. Needs advise on coming off eliquis for bariatric surgery. OK to come off 24 hours prior to surgery and be bridged while in the hospital and restart when she leaves the hospital. Not on eliques for cardiac reason. Unsafe to go without anticoagulation. Note to be faxed to bariatrics.

## 2017-01-07 NOTE — Assessment & Plan Note (Signed)
Rechecking levels today. Will adjust medication as needed. Call with any concerns.  

## 2017-01-07 NOTE — Progress Notes (Signed)
BP 129/83 (BP Location: Left Arm, Patient Position: Sitting, Cuff Size: Large)   Pulse 70   Temp 98.3 F (36.8 C)   Wt (!) 300 lb 7 oz (136.3 kg)   SpO2 96%   BMI 53.22 kg/m    Subjective:    Patient ID: Kaylee Fox, female    DOB: 12/14/1960, 56 y.o.   MRN: [id number]  HPI: Kaylee Fox is a 56 y.o. female  Chief Complaint  Patient presents with  . Medication Refill    Atarax   Here today for clearance for her gastric bypass surgery. She is seeing the hospital and the anesthesiologist for EKG and blood work later this week. She is scheduled for surgery on 01/17/17. She is looking forward to it. She notes that the bariatric specialist needs clearance on her for her eliquis. She is currently taking the elequis for blood clots. When she was first diagnosed with her lupus, she had a couple of PEs. She has been diagnosed with lupus anticoagulant syndrome and has been on blood thinners ever since. She is feeling well today and is able to walk much further. She is able to go up and down stairs without becoming SOB.    Kaylee Fox notes that she doesn't think she has ever had surgery where she was totally out, just had a c-section in 1988, unsure if they intubated her or not. No nausea with the procedure. No family history of problems with anesthesia. No family history of malignant hyperthermia. She is otherwise feeling well today with no other concerns or complaints at this time.   HYPOTHYROIDISM Thyroid control status:controlled Satisfied with current treatment? yes Medication side effects: no Medication compliance: excellent compliance Etiology of hypothyroidism:  Recent dose adjustment:no Fatigue: no Cold intolerance: no Heat intolerance: no Weight gain: no Weight loss: no Constipation: no Diarrhea/loose stools: no Palpitations: no Lower extremity edema: no Anxiety/depressed mood: no  HYPERTENSION Hypertension status: controlled  Satisfied with current treatment?  yes Duration of hypertension: chronic BP monitoring frequency:  not checking BP medication side effects:  no Medication compliance: excellent compliance Previous BP meds: amlodipine, lasix Aspirin: no Recurrent headaches: no Visual changes: no Palpitations: no Dyspnea: no Chest pain: no Lower extremity edema: no Dizzy/lightheaded: no  DEPRESSION Mood status: controlled Satisfied with current treatment?: yes Symptom severity: mild  Duration of current treatment : chronic Side effects: no Medication compliance: excellent compliance Psychotherapy/counseling: no  Depressed mood: no Anxious mood: no Anhedonia: no Significant weight loss or gain: no Insomnia: no  Fatigue: no Feelings of worthlessness or guilt: no Impaired concentration/indecisiveness: no Suicidal ideations: no Hopelessness: no Crying spells: no Depression screen [facility] 2/9 08/20/2016 06/17/2016 04/19/2016  Decreased Interest 0 0 0  Down, Depressed, Hopeless 0 0 0  PHQ - 2 Score 0 0 0  Altered sleeping 2 - -  Tired, decreased energy 2 - -  Change in appetite 0 - -  Feeling bad or failure about yourself  0 - -  Trouble concentrating 2 - -  Moving slowly or fidgety/restless 0 - -  Suicidal thoughts 0 - -  PHQ-9 Score 6 - -     Relevant past medical, surgical, family and social history reviewed and updated as indicated. Interim medical history since our last visit reviewed. Allergies and medications reviewed and updated.  Review of Systems  Constitutional: Negative.   Respiratory: Negative.   Cardiovascular: Negative.   Gastrointestinal: Negative.   Musculoskeletal: Negative.   Psychiatric/Behavioral: Negative.     Per HPI unless specifically  indicated above     Objective:    BP 129/83 (BP Location: Left Arm, Patient Position: Sitting, Cuff Size: Large)   Pulse 70   Temp 98.3 F (36.8 C)   Wt (!) 300 lb 7 oz (136.3 kg)   SpO2 96%   BMI 53.22 kg/m   Wt Readings from Last 3 Encounters:   01/07/17 (!) 300 lb 7 oz (136.3 kg)  11/09/16 (!) 305 lb (138.3 kg)  11/01/16 297 lb (134.7 kg)    Physical Exam  Constitutional: She is oriented to person, place, and time. She appears well-developed and well-nourished. No distress.  HENT:  Head: Normocephalic and atraumatic.  Right Ear: Hearing normal.  Left Ear: Hearing normal.  Nose: Nose normal.  Eyes: Conjunctivae and lids are normal. Right eye exhibits no discharge. Left eye exhibits no discharge. No scleral icterus.  Cardiovascular: Normal rate, regular rhythm, normal heart sounds and intact distal pulses.  Exam reveals no gallop and no friction rub.   No murmur heard. Pulmonary/Chest: Effort normal and breath sounds normal. No respiratory distress. She has no wheezes. She has no rales. She exhibits no tenderness.  Musculoskeletal: Normal range of motion.  Neurological: She is alert and oriented to person, place, and time.  Skin: Skin is warm, dry and intact. No rash noted. She is not diaphoretic. No erythema. No pallor.  Psychiatric: She has a normal mood and affect. Her speech is normal and behavior is normal. Judgment and thought content normal. Cognition and memory are normal.  Nursing note and vitals reviewed.   Results for orders placed or performed in visit on 08/20/16  Microscopic Examination  Result Value Ref Range   WBC, UA 0-5 0 - 5 /hpf   RBC, UA None seen 0 - 2 /hpf   Epithelial Cells (non renal) 0-10 0 - 10 /hpf   Mucus, UA Present (A) Not Estab.   Bacteria, UA None seen None seen/Few  Comprehensive metabolic panel  Result Value Ref Range   Glucose 81 65 - 99 mg/dL   BUN 13 6 - 24 mg/dL   Creatinine, Ser 9.60 0.57 - 1.00 mg/dL   GFR calc non Af Amer 92 >59 mL/min/1.73   GFR calc Af Amer 106 >59 mL/min/1.73   BUN/Creatinine Ratio 18 9 - 23   Sodium 144 134 - 144 mmol/L   Potassium 4.5 3.5 - 5.2 mmol/L   Chloride 107 (H) 96 - 106 mmol/L   CO2 24 20 - 29 mmol/L   Calcium 9.7 8.7 - 10.2 mg/dL   Total  Protein 7.3 6.0 - 8.5 g/dL   Albumin 4.2 3.5 - 5.5 g/dL   Globulin, Total 3.1 1.5 - 4.5 g/dL   Albumin/Globulin Ratio 1.4 1.2 - 2.2   Bilirubin Total 0.4 0.0 - 1.2 mg/dL   Alkaline Phosphatase 92 39 - 117 IU/L   AST 23 0 - 40 IU/L   ALT 26 0 - 32 IU/L  Lipid Panel w/o Chol/HDL Ratio  Result Value Ref Range   Cholesterol, Total 212 (H) 100 - 199 mg/dL   Triglycerides 454 0 - 149 mg/dL   HDL 52 >09 mg/dL   VLDL Cholesterol Cal 21 5 - 40 mg/dL   LDL Calculated 811 (H) 0 - 99 mg/dL  Microalbumin, Urine Waived  Result Value Ref Range   Microalb, Ur Waived 150 (H) 0 - 19 mg/L   Creatinine, Urine Waived 300 10 - 300 mg/dL   Microalb/Creat Ratio 30-300 (H) <30 mg/g  TSH  Result Value  Ref Range   TSH 4.100 0.450 - 4.500 uIU/mL  UA/M w/rflx Culture, Routine  Result Value Ref Range   Specific Gravity, UA >1.030 (H) 1.005 - 1.030   pH, UA 5.5 5.0 - 7.5   Color, UA Yellow Yellow   Appearance Ur Cloudy (A) Clear   Leukocytes, UA Trace (A) Negative   Protein, UA 2+ (A) Negative/Trace   Glucose, UA Negative Negative   Ketones, UA Trace (A) Negative   RBC, UA Negative Negative   Bilirubin, UA Negative Negative   Urobilinogen, Ur 0.2 0.2 - 1.0 mg/dL   Nitrite, UA Negative Negative   Microscopic Examination See below:   CBC with Differential/Platelet  Result Value Ref Range   WBC 6.6 3.4 - 10.8 x10E3/uL   RBC 3.95 3.77 - 5.28 x10E6/uL   Hemoglobin 12.5 11.1 - 15.9 g/dL   Hematocrit 16.1 09.6 - 46.6 %   MCV 97 79 - 97 fL   MCH 31.6 26.6 - 33.0 pg   MCHC 32.6 31.5 - 35.7 g/dL   RDW 04.5 40.9 - 81.1 %   Platelets 291 150 - 379 x10E3/uL   Neutrophils 58 Not Estab. %   Lymphs 32 Not Estab. %   Monocytes 7 Not Estab. %   Eos 2 Not Estab. %   Basos 1 Not Estab. %   Neutrophils Absolute 3.8 1.4 - 7.0 x10E3/uL   Lymphocytes Absolute 2.1 0.7 - 3.1 x10E3/uL   Monocytes Absolute 0.5 0.1 - 0.9 x10E3/uL   EOS (ABSOLUTE) 0.2 0.0 - 0.4 x10E3/uL   Basophils Absolute 0.0 0.0 - 0.2 x10E3/uL    Immature Granulocytes 0 Not Estab. %   Immature Grans (Abs) 0.0 0.0 - 0.1 x10E3/uL  IGP, Aptima HPV, rfx 16/18,45  Result Value Ref Range   DIAGNOSIS: Comment    Specimen adequacy: Comment    Clinician Provided ICD10 Comment    Performed by: Comment    QC reviewed by: Comment    PAP Smear Comment .    Note: Comment    Test Methodology Comment    HPV Aptima Negative Negative      Assessment & Plan:   Problem List Items Addressed This Visit      Endocrine   Autoimmune hypothyroidism    Rechecking levels today. Will adjust medication as needed. Call with any concerns.       Relevant Orders   CBC with Differential/Platelet   Comprehensive metabolic panel   TSH     Genitourinary   Benign hypertensive renal disease    Under good control. Continue current regimen. Continue to monitor. Call with any concerns.       Relevant Orders   CBC with Differential/Platelet   Comprehensive metabolic panel   Lipid Panel w/o Chol/HDL Ratio     Other   Depression    Under good control. Continue current regimen. Continue to monitor. Call with any concerns.       Relevant Orders   CBC with Differential/Platelet   Comprehensive metabolic panel   Anxiety    Under good control. Continue current regimen. Continue to monitor. Call with any concerns.       Relevant Orders   CBC with Differential/Platelet   Comprehensive metabolic panel   Vitamin D deficiency    Rechecking levels today. Await results. Call with any concerns.       Relevant Orders   CBC with Differential/Platelet   Comprehensive metabolic panel   Lupus (systemic lupus erythematosus) (HCC)    Lupus anticoagulant syndrome. Needs  advise on coming off eliquis for bariatric surgery. OK to come off 24 hours prior to surgery and be bridged while in the hospital and restart when she leaves the hospital. Not on eliques for cardiac reason. Unsafe to go without anticoagulation. Note to be faxed to bariatrics.       Preoperative  clearance - Primary    Having labs and EKG done at the hospital. OK to stop her eliquis 1 day before surgery with bridging while in the hospital and restart 1 day after. Cleared pending anesthesiology evaluation.           Follow up plan: Return in about 6 months (around 07/07/2017) for Physical.

## 2017-01-08 LAB — LIPID PANEL W/O CHOL/HDL RATIO
CHOLESTEROL TOTAL: 230 mg/dL — AB (ref 100–199)
HDL: 44 mg/dL (ref >39)
LDL Calculated: 160 mg/dL — ABNORMAL HIGH (ref 0–99)
Triglycerides: 132 mg/dL (ref 0–149)
VLDL CHOLESTEROL CAL: 26 mg/dL (ref 5–40)

## 2017-01-08 LAB — CBC WITH DIFFERENTIAL/PLATELET
BASOS: 1 %
Basophils Absolute: 0.1 10*3/uL (ref 0.0–0.2)
EOS (ABSOLUTE): 0.1 10*3/uL (ref 0.0–0.4)
Eos: 2 %
Hematocrit: 41.5 % (ref 34.0–46.6)
Hemoglobin: 13.3 g/dL (ref 11.1–15.9)
IMMATURE GRANS (ABS): 0 10*3/uL (ref 0.0–0.1)
Immature Granulocytes: 0 %
LYMPHS: 26 %
Lymphocytes Absolute: 1.5 10*3/uL (ref 0.7–3.1)
MCH: 32.1 pg (ref 26.6–33.0)
MCHC: 32 g/dL (ref 31.5–35.7)
MCV: 100 fL — AB (ref 79–97)
MONOS ABS: 0.5 10*3/uL (ref 0.1–0.9)
Monocytes: 8 %
NEUTROS ABS: 3.7 10*3/uL (ref 1.4–7.0)
Neutrophils: 63 %
PLATELETS: 342 10*3/uL (ref 150–379)
RBC: 4.14 x10E6/uL (ref 3.77–5.28)
RDW: 13.6 % (ref 12.3–15.4)
WBC: 5.8 10*3/uL (ref 3.4–10.8)

## 2017-01-08 LAB — COMPREHENSIVE METABOLIC PANEL
A/G RATIO: 1.6 (ref 1.2–2.2)
ALT: 17 IU/L (ref 0–32)
AST: 26 IU/L (ref 0–40)
Albumin: 4.4 g/dL (ref 3.5–5.5)
Alkaline Phosphatase: 75 IU/L (ref 39–117)
BILIRUBIN TOTAL: 0.4 mg/dL (ref 0.0–1.2)
BUN / CREAT RATIO: 22 (ref 9–23)
BUN: 17 mg/dL (ref 6–24)
CHLORIDE: 103 mmol/L (ref 96–106)
CO2: 28 mmol/L (ref 20–29)
Calcium: 9.5 mg/dL (ref 8.7–10.2)
Creatinine, Ser: 0.76 mg/dL (ref 0.57–1.00)
GFR calc non Af Amer: 89 mL/min/{1.73_m2} (ref >59)
GFR, EST AFRICAN AMERICAN: 102 mL/min/{1.73_m2} (ref >59)
GLOBULIN, TOTAL: 2.8 g/dL (ref 1.5–4.5)
Glucose: 99 mg/dL (ref 65–99)
POTASSIUM: 4.6 mmol/L (ref 3.5–5.2)
SODIUM: 143 mmol/L (ref 134–144)
TOTAL PROTEIN: 7.2 g/dL (ref 6.0–8.5)

## 2017-01-08 LAB — TSH: TSH: 4.42 u[IU]/mL (ref 0.450–4.500)

## 2017-01-10 ENCOUNTER — Encounter: Payer: Self-pay | Admitting: Family Medicine

## 2017-01-11 DIAGNOSIS — Z01818 Encounter for other preprocedural examination: Secondary | ICD-10-CM | POA: Diagnosis not present

## 2017-01-12 ENCOUNTER — Telehealth: Payer: Self-pay | Admitting: Family Medicine

## 2017-01-12 NOTE — Telephone Encounter (Signed)
Patient notified

## 2017-01-12 NOTE — Telephone Encounter (Signed)
We told her to stop the eliquis 24 hours before her surgery and to be bridged while she is in the hospital- if the anesthesiologist or her surgeon want her to do something different, they will tell her what to do. I wasn't intending for her to give herself shots at home.

## 2017-01-12 NOTE — Telephone Encounter (Signed)
Copied from CRM [id number]. Topic: Quick Communication - See Telephone Encounter >> Jan 12, 2017  9:27 AM Guinevere FerrariMorris, [place] E, NT wrote: CRM for notification. See Telephone encounter for: Pt. Called in because she is having surgery on Monday and wanted to know what day was she is supposed to stop her blood thinners and start the Lovenox. Pt. Also wanted to know if someone could explain to her how to give herself the shots if she have to start taking the shots on Sunday. Pt. Would like a call back.  01/12/17.

## 2017-01-17 ENCOUNTER — Other Ambulatory Visit: Payer: Self-pay | Admitting: Family Medicine

## 2017-01-17 DIAGNOSIS — M069 Rheumatoid arthritis, unspecified: Secondary | ICD-10-CM | POA: Diagnosis not present

## 2017-01-17 DIAGNOSIS — M810 Age-related osteoporosis without current pathological fracture: Secondary | ICD-10-CM | POA: Diagnosis not present

## 2017-01-17 DIAGNOSIS — Z6841 Body Mass Index (BMI) 40.0 and over, adult: Secondary | ICD-10-CM | POA: Diagnosis not present

## 2017-01-17 DIAGNOSIS — D6862 Lupus anticoagulant syndrome: Secondary | ICD-10-CM | POA: Diagnosis not present

## 2017-01-17 DIAGNOSIS — I10 Essential (primary) hypertension: Secondary | ICD-10-CM | POA: Diagnosis not present

## 2017-01-17 DIAGNOSIS — G8929 Other chronic pain: Secondary | ICD-10-CM | POA: Diagnosis not present

## 2017-01-17 DIAGNOSIS — E039 Hypothyroidism, unspecified: Secondary | ICD-10-CM | POA: Diagnosis not present

## 2017-01-17 DIAGNOSIS — K219 Gastro-esophageal reflux disease without esophagitis: Secondary | ICD-10-CM | POA: Diagnosis not present

## 2017-01-17 DIAGNOSIS — M797 Fibromyalgia: Secondary | ICD-10-CM | POA: Diagnosis not present

## 2017-01-18 NOTE — Telephone Encounter (Signed)
Please advise 

## 2017-01-18 NOTE — Telephone Encounter (Signed)
Rx refill

## 2017-01-19 MED ORDER — ENOXAPARIN SODIUM 40 MG/0.4ML ~~LOC~~ SOLN
40.00 | SUBCUTANEOUS | Status: DC
Start: 2017-01-19 — End: 2017-01-19

## 2017-01-19 MED ORDER — ONDANSETRON HCL 4 MG/2ML IJ SOLN
4.00 | INTRAMUSCULAR | Status: DC
Start: ? — End: 2017-01-19

## 2017-01-19 MED ORDER — GENERIC EXTERNAL MEDICATION
12.50 | Status: DC
Start: ? — End: 2017-01-19

## 2017-01-19 MED ORDER — ALBUTEROL SULFATE (2.5 MG/3ML) 0.083% IN NEBU
2.50 | INHALATION_SOLUTION | RESPIRATORY_TRACT | Status: DC
Start: ? — End: 2017-01-19

## 2017-01-19 MED ORDER — HYDRALAZINE HCL 20 MG/ML IJ SOLN
10.00 | INTRAMUSCULAR | Status: DC
Start: ? — End: 2017-01-19

## 2017-01-19 MED ORDER — DIPHENHYDRAMINE HCL 50 MG/ML IJ SOLN
25.00 | INTRAMUSCULAR | Status: DC
Start: ? — End: 2017-01-19

## 2017-01-19 MED ORDER — HYDROMORPHONE HCL 1 MG/ML IJ SOLN
0.50 | INTRAMUSCULAR | Status: DC
Start: ? — End: 2017-01-19

## 2017-01-19 MED ORDER — ALUMINUM-MAGNESIUM-SIMETHICONE 200-200-20 MG/5ML PO SUSP
20.00 | ORAL | Status: DC
Start: ? — End: 2017-01-19

## 2017-01-19 MED ORDER — SODIUM CHLORIDE 0.9 % IV SOLN
150.00 | INTRAVENOUS | Status: DC
Start: ? — End: 2017-01-19

## 2017-01-19 MED ORDER — FAMOTIDINE 20 MG/2ML IV SOLN
20.00 | INTRAVENOUS | Status: DC
Start: 2017-01-20 — End: 2017-01-19

## 2017-01-19 MED ORDER — HYDROCODONE-ACETAMINOPHEN 7.5-325 MG/15ML PO SOLN
15.00 | ORAL | Status: DC
Start: ? — End: 2017-01-19

## 2017-01-24 ENCOUNTER — Other Ambulatory Visit (INDEPENDENT_AMBULATORY_CARE_PROVIDER_SITE_OTHER): Payer: Self-pay | Admitting: Vascular Surgery

## 2017-01-24 ENCOUNTER — Ambulatory Visit: Payer: Medicare PPO | Admitting: Family Medicine

## 2017-01-24 DIAGNOSIS — M329 Systemic lupus erythematosus, unspecified: Secondary | ICD-10-CM | POA: Diagnosis not present

## 2017-01-24 DIAGNOSIS — Z79899 Other long term (current) drug therapy: Secondary | ICD-10-CM | POA: Diagnosis not present

## 2017-01-24 DIAGNOSIS — M542 Cervicalgia: Secondary | ICD-10-CM | POA: Diagnosis not present

## 2017-01-24 DIAGNOSIS — M5030 Other cervical disc degeneration, unspecified cervical region: Secondary | ICD-10-CM | POA: Diagnosis not present

## 2017-01-24 DIAGNOSIS — I83899 Varicose veins of unspecified lower extremities with other complications: Secondary | ICD-10-CM

## 2017-01-24 DIAGNOSIS — E559 Vitamin D deficiency, unspecified: Secondary | ICD-10-CM | POA: Diagnosis not present

## 2017-01-24 DIAGNOSIS — E2749 Other adrenocortical insufficiency: Secondary | ICD-10-CM | POA: Diagnosis not present

## 2017-01-25 ENCOUNTER — Other Ambulatory Visit (INDEPENDENT_AMBULATORY_CARE_PROVIDER_SITE_OTHER): Payer: Medicare PPO | Admitting: Vascular Surgery

## 2017-01-31 ENCOUNTER — Encounter (INDEPENDENT_AMBULATORY_CARE_PROVIDER_SITE_OTHER): Payer: Medicare PPO

## 2017-02-08 ENCOUNTER — Other Ambulatory Visit (INDEPENDENT_AMBULATORY_CARE_PROVIDER_SITE_OTHER): Payer: Medicare PPO | Admitting: Vascular Surgery

## 2017-02-11 ENCOUNTER — Encounter (INDEPENDENT_AMBULATORY_CARE_PROVIDER_SITE_OTHER): Payer: Medicare PPO

## 2017-03-14 ENCOUNTER — Other Ambulatory Visit: Payer: Self-pay | Admitting: Family Medicine

## 2017-03-18 ENCOUNTER — Other Ambulatory Visit (INDEPENDENT_AMBULATORY_CARE_PROVIDER_SITE_OTHER): Payer: Medicare PPO | Admitting: Vascular Surgery

## 2017-03-22 ENCOUNTER — Other Ambulatory Visit (INDEPENDENT_AMBULATORY_CARE_PROVIDER_SITE_OTHER): Payer: Medicare PPO | Admitting: Vascular Surgery

## 2017-04-05 ENCOUNTER — Other Ambulatory Visit: Payer: Self-pay | Admitting: Family Medicine

## 2017-04-08 ENCOUNTER — Ambulatory Visit (INDEPENDENT_AMBULATORY_CARE_PROVIDER_SITE_OTHER): Payer: Medicare HMO | Admitting: Family Medicine

## 2017-04-08 ENCOUNTER — Encounter: Payer: Self-pay | Admitting: Family Medicine

## 2017-04-08 VITALS — BP 132/84 | HR 60 | Temp 97.5°F | Wt 241.1 lb

## 2017-04-08 DIAGNOSIS — R05 Cough: Secondary | ICD-10-CM

## 2017-04-08 DIAGNOSIS — R0981 Nasal congestion: Secondary | ICD-10-CM

## 2017-04-08 DIAGNOSIS — R52 Pain, unspecified: Secondary | ICD-10-CM

## 2017-04-08 DIAGNOSIS — F331 Major depressive disorder, recurrent, moderate: Secondary | ICD-10-CM | POA: Diagnosis not present

## 2017-04-08 DIAGNOSIS — R059 Cough, unspecified: Secondary | ICD-10-CM

## 2017-04-08 LAB — VERITOR FLU A/B WAIVED
INFLUENZA B: NEGATIVE
Influenza A: NEGATIVE

## 2017-04-08 MED ORDER — VENLAFAXINE HCL ER 75 MG PO CP24: 75.0000 mg | ORAL_CAPSULE | Freq: Every day | ORAL | 3 refills | Status: DC

## 2017-04-08 MED ORDER — PREDNISONE 50 MG PO TABS: 50.0000 mg | ORAL_TABLET | Freq: Every day | ORAL | 0 refills | Status: DC

## 2017-04-08 NOTE — Progress Notes (Signed)
BP 132/84 (BP Location: Left Arm, Patient Position: Sitting, Cuff Size: Large)   Pulse 60   Temp (!) 97.5 F (36.4 C)   Wt 241 lb 1 oz (109.3 kg)   SpO2 100%   BMI 42.70 kg/m    Subjective:    Patient ID: Kaylee Fox, female    DOB: 02-12-1961, 57 y.o.   MRN: [id number]  HPI: Kaylee Fox is a 57 y.o. female  Chief Complaint  Patient presents with  . URI   UPPER RESPIRATORY TRACT INFECTION Duration: 3-4 days Worst symptom: congestion, ear pain Fever: no Cough: no Shortness of breath: no Wheezing: no Chest pain: no Chest tightness: no Chest congestion: no Nasal congestion: yes Runny nose: yes Post nasal drip: yes Sneezing: yes Sore throat: yes Swollen glands: yes Sinus pressure: yes Headache: no Face pain: no Toothache: no Ear pain: yes left Ear pressure: yes left Eyes red/itching:yes Eye drainage/crusting: no  Vomiting: no Rash: no Fatigue: yes Sick contacts: yes Strep contacts: no  Context: stable Recurrent sinusitis: no Relief with OTC cold/cough medications: no  Treatments attempted: flonase   DEPRESSION Mood status: exacerbated Satisfied with current treatment?: no Symptom severity: moderate  Duration of current treatment : chronic Side effects: no Medication compliance: excellent compliance Psychotherapy/counseling: no  Previous psychiatric medications: effexor Depressed mood: yes Anxious mood: yes Anhedonia: no Significant weight loss or gain: yes Insomnia: no  Fatigue: yes Feelings of worthlessness or guilt: yes Impaired concentration/indecisiveness: yes Suicidal ideations: no Hopelessness: no Crying spells: yes Depression screen [facility] 2/9 04/08/2017 08/20/2016 06/17/2016 04/19/2016  Decreased Interest 2 0 0 0  Down, Depressed, Hopeless 2 0 0 0  PHQ - 2 Score 4 0 0 0  Altered sleeping 2 2 - -  Tired, decreased energy 2 2 - -  Change in appetite 1 0 - -  Feeling bad or failure about yourself  1 0 - -  Trouble concentrating 0 2 - -    Moving slowly or fidgety/restless 0 0 - -  Suicidal thoughts 0 0 - -  PHQ-9 Score 10 6 - -     Relevant past medical, surgical, family and social history reviewed and updated as indicated. Interim medical history since our last visit reviewed. Allergies and medications reviewed and updated.  Review of Systems  Constitutional: Negative.   HENT: Positive for congestion, ear pain, postnasal drip, rhinorrhea, sinus pressure, sneezing and sore throat. Negative for dental problem, drooling, ear discharge, facial swelling, hearing loss, mouth sores, nosebleeds, sinus pain, tinnitus, trouble swallowing and voice change.   Respiratory: Negative.   Cardiovascular: Negative.   Psychiatric/Behavioral: Negative.     Per HPI unless specifically indicated above     Objective:    BP 132/84 (BP Location: Left Arm, Patient Position: Sitting, Cuff Size: Large)   Pulse 60   Temp (!) 97.5 F (36.4 C)   Wt 241 lb 1 oz (109.3 kg)   SpO2 100%   BMI 42.70 kg/m   Wt Readings from Last 3 Encounters:  04/08/17 241 lb 1 oz (109.3 kg)  01/07/17 (!) 300 lb 7 oz (136.3 kg)  11/09/16 (!) 305 lb (138.3 kg)    Physical Exam  Constitutional: She is oriented to person, place, and time. She appears well-developed and well-nourished. No distress.  HENT:  Head: Normocephalic and atraumatic.  Right Ear: Hearing and external ear normal.  Left Ear: Hearing and external ear normal.  Nose: Nose normal.  Mouth/Throat: Oropharynx is clear and moist. No oropharyngeal exudate.  Eyes:  Conjunctivae, EOM and lids are normal. Pupils are equal, round, and reactive to light. Right eye exhibits no discharge. Left eye exhibits no discharge. No scleral icterus.  Neck: Normal range of motion. Neck supple. No JVD present. No tracheal deviation present. No thyromegaly present.  Cardiovascular: Normal rate, regular rhythm, normal heart sounds and intact distal pulses. Exam reveals no gallop and no friction rub.  No murmur  heard. Pulmonary/Chest: Effort normal and breath sounds normal. No stridor. No respiratory distress. She has no wheezes. She has no rales. She exhibits no tenderness.  Musculoskeletal: Normal range of motion.  Lymphadenopathy:    She has no cervical adenopathy.  Neurological: She is alert and oriented to person, place, and time.  Skin: Skin is warm, dry and intact. No rash noted. She is not diaphoretic. No erythema. No pallor.  Psychiatric: She has a normal mood and affect. Her speech is normal and behavior is normal. Judgment and thought content normal. Cognition and memory are normal.  Nursing note and vitals reviewed.   Results for orders placed or performed in visit on 01/07/17  CBC with Differential/Platelet  Result Value Ref Range   WBC 5.8 3.4 - 10.8 x10E3/uL   RBC 4.14 3.77 - 5.28 x10E6/uL   Hemoglobin 13.3 11.1 - 15.9 g/dL   Hematocrit 16.1 09.6 - 46.6 %   MCV 100 (H) 79 - 97 fL   MCH 32.1 26.6 - 33.0 pg   MCHC 32.0 31.5 - 35.7 g/dL   RDW 04.5 40.9 - 81.1 %   Platelets 342 150 - 379 x10E3/uL   Neutrophils 63 Not Estab. %   Lymphs 26 Not Estab. %   Monocytes 8 Not Estab. %   Eos 2 Not Estab. %   Basos 1 Not Estab. %   Neutrophils Absolute 3.7 1.4 - 7.0 x10E3/uL   Lymphocytes Absolute 1.5 0.7 - 3.1 x10E3/uL   Monocytes Absolute 0.5 0.1 - 0.9 x10E3/uL   EOS (ABSOLUTE) 0.1 0.0 - 0.4 x10E3/uL   Basophils Absolute 0.1 0.0 - 0.2 x10E3/uL   Immature Granulocytes 0 Not Estab. %   Immature Grans (Abs) 0.0 0.0 - 0.1 x10E3/uL  Comprehensive metabolic panel  Result Value Ref Range   Glucose 99 65 - 99 mg/dL   BUN 17 6 - 24 mg/dL   Creatinine, Ser 9.14 0.57 - 1.00 mg/dL   GFR calc non Af Amer 89 >59 mL/min/1.73   GFR calc Af Amer 102 >59 mL/min/1.73   BUN/Creatinine Ratio 22 9 - 23   Sodium 143 134 - 144 mmol/L   Potassium 4.6 3.5 - 5.2 mmol/L   Chloride 103 96 - 106 mmol/L   CO2 28 20 - 29 mmol/L   Calcium 9.5 8.7 - 10.2 mg/dL   Total Protein 7.2 6.0 - 8.5 g/dL   Albumin  4.4 3.5 - 5.5 g/dL   Globulin, Total 2.8 1.5 - 4.5 g/dL   Albumin/Globulin Ratio 1.6 1.2 - 2.2   Bilirubin Total 0.4 0.0 - 1.2 mg/dL   Alkaline Phosphatase 75 39 - 117 IU/L   AST 26 0 - 40 IU/L   ALT 17 0 - 32 IU/L  Lipid Panel w/o Chol/HDL Ratio  Result Value Ref Range   Cholesterol, Total 230 (H) 100 - 199 mg/dL   Triglycerides 782 0 - 149 mg/dL   HDL 44 >95 mg/dL   VLDL Cholesterol Cal 26 5 - 40 mg/dL   LDL Calculated 621 (H) 0 - 99 mg/dL  TSH  Result Value Ref Range  TSH 4.420 0.450 - 4.500 uIU/mL      Assessment & Plan:   Problem List Items Addressed This Visit      Other   Depression    Not under good control. Will increase effexor to 225mg  and recheck 1 month. Call with any concerns.       Relevant Medications   venlafaxine XR (EFFEXOR XR) 75 MG 24 hr capsule    Other Visit Diagnoses    Nasal congestion    -  Primary   No sign of infection. Will treat congestion with burst of prednisone. Call with any concerns.    Cough       Negative flu.   Relevant Orders   Veritor Flu A/B Waived   Body aches       Negative flu.   Relevant Orders   Veritor Flu A/B Waived       Follow up plan: Return in about 4 weeks (around 05/06/2017) for follow up mood.

## 2017-04-08 NOTE — Assessment & Plan Note (Signed)
Not under good control. Will increase effexor to 225mg  and recheck 1 month. Call with any concerns.

## 2017-04-12 ENCOUNTER — Ambulatory Visit (INDEPENDENT_AMBULATORY_CARE_PROVIDER_SITE_OTHER): Payer: Medicare HMO | Admitting: Vascular Surgery

## 2017-04-12 ENCOUNTER — Encounter (INDEPENDENT_AMBULATORY_CARE_PROVIDER_SITE_OTHER): Payer: Self-pay | Admitting: Vascular Surgery

## 2017-04-12 VITALS — BP 154/76 | HR 60 | Resp 16 | Wt 249.6 lb

## 2017-04-12 DIAGNOSIS — I83813 Varicose veins of bilateral lower extremities with pain: Secondary | ICD-10-CM

## 2017-04-12 DIAGNOSIS — I83812 Varicose veins of left lower extremities with pain: Secondary | ICD-10-CM

## 2017-04-12 NOTE — Progress Notes (Signed)
Varicose veins of both lower extremities with pain     The patient's left lower extremity was sterilely prepped and draped. The ultrasound machine was used to visualize the saphenous vein throughout its course. A segment just below the knee was selected for access. The saphenous vein was accessed with minimal difficulty using ultrasound guidance with a micro puncture needle. A micro puncture wire and sheath were then placed. A 0.018 wire was placed beyond the saphenofemoral junction through the sheath and the micro puncture sheath was removed. The 65 cm sheath was then placed over the wire and the wire and dilator were removed. The laser fiber was placed through the sheath and its tip was placed approximately 3-4 cm below the saphenofemoral junction. Tumescent anesthesia was then created with a dilute lidocaine solution. Laser energy was then delivered with constant withdrawal of the sheath and laser fiber. Approximately 1246 Joules of energy were delivered over a length of 33 cm using the 1470 Hz VenaCure machine at [employer]. Sterile dressings were placed. The patient tolerated the procedure well without complications.

## 2017-04-15 ENCOUNTER — Ambulatory Visit (INDEPENDENT_AMBULATORY_CARE_PROVIDER_SITE_OTHER): Payer: Medicare HMO

## 2017-04-15 DIAGNOSIS — I83899 Varicose veins of unspecified lower extremities with other complications: Secondary | ICD-10-CM | POA: Diagnosis not present

## 2017-05-02 ENCOUNTER — Other Ambulatory Visit: Payer: Self-pay | Admitting: Family Medicine

## 2017-05-03 ENCOUNTER — Encounter (INDEPENDENT_AMBULATORY_CARE_PROVIDER_SITE_OTHER): Payer: Self-pay | Admitting: Vascular Surgery

## 2017-05-03 ENCOUNTER — Ambulatory Visit (INDEPENDENT_AMBULATORY_CARE_PROVIDER_SITE_OTHER): Payer: Medicare HMO | Admitting: Vascular Surgery

## 2017-05-03 VITALS — BP 141/86 | HR 63 | Resp 15 | Ht 63.0 in | Wt 242.0 lb

## 2017-05-03 DIAGNOSIS — I83811 Varicose veins of right lower extremities with pain: Secondary | ICD-10-CM | POA: Diagnosis not present

## 2017-05-03 DIAGNOSIS — I83813 Varicose veins of bilateral lower extremities with pain: Secondary | ICD-10-CM | POA: Diagnosis not present

## 2017-05-03 NOTE — Progress Notes (Signed)
Varicose veins of both lower extremities with pain     The patient's right lower extremity was sterilely prepped and draped. The ultrasound machine was used to visualize the saphenous vein throughout its course. A segment at the level of the knee was selected for access. The saphenous vein was accessed without difficulty using ultrasound guidance with a micro puncture needle. A micro puncture wire and sheath were then placed. A 0.018 wire was placed beyond the saphenofemoral junction through the sheath and the micro puncture sheath was removed. The 65 cm sheath was then placed over the wire and the wire and dilator were removed. The laser fiber was placed through the sheath and its tip was placed approximately 4-5 cm below the saphenofemoral junction. Tumescent anesthesia was then created with a dilute lidocaine solution. Laser energy was then delivered with constant withdrawal of the sheath and laser fiber. Approximately 1046 joules of energy were delivered over a length of 28 cm using the 1470 Hz VenaCure machine at [employer]. Sterile dressings were placed. The patient tolerated the procedure well without complications.

## 2017-05-04 DIAGNOSIS — M5030 Other cervical disc degeneration, unspecified cervical region: Secondary | ICD-10-CM | POA: Diagnosis not present

## 2017-05-04 DIAGNOSIS — E559 Vitamin D deficiency, unspecified: Secondary | ICD-10-CM | POA: Diagnosis not present

## 2017-05-04 DIAGNOSIS — Z23 Encounter for immunization: Secondary | ICD-10-CM | POA: Diagnosis not present

## 2017-05-04 DIAGNOSIS — Z79899 Other long term (current) drug therapy: Secondary | ICD-10-CM | POA: Diagnosis not present

## 2017-05-04 DIAGNOSIS — M329 Systemic lupus erythematosus, unspecified: Secondary | ICD-10-CM | POA: Diagnosis not present

## 2017-05-06 ENCOUNTER — Ambulatory Visit (INDEPENDENT_AMBULATORY_CARE_PROVIDER_SITE_OTHER): Payer: Medicare HMO

## 2017-05-06 DIAGNOSIS — I83813 Varicose veins of bilateral lower extremities with pain: Secondary | ICD-10-CM

## 2017-05-09 ENCOUNTER — Ambulatory Visit (INDEPENDENT_AMBULATORY_CARE_PROVIDER_SITE_OTHER): Payer: Medicare HMO | Admitting: Family Medicine

## 2017-05-09 ENCOUNTER — Encounter: Payer: Self-pay | Admitting: Family Medicine

## 2017-05-09 VITALS — BP 118/79 | HR 71 | Temp 97.7°F | Wt 241.6 lb

## 2017-05-09 DIAGNOSIS — J301 Allergic rhinitis due to pollen: Secondary | ICD-10-CM

## 2017-05-09 DIAGNOSIS — F331 Major depressive disorder, recurrent, moderate: Secondary | ICD-10-CM

## 2017-05-09 MED ORDER — VENLAFAXINE HCL ER 150 MG PO CP24: 150.0000 mg | ORAL_CAPSULE | Freq: Every day | ORAL | 1 refills | Status: DC

## 2017-05-09 MED ORDER — VENLAFAXINE HCL ER 75 MG PO CP24: 75.0000 mg | ORAL_CAPSULE | Freq: Every day | ORAL | 3 refills | Status: DC

## 2017-05-09 MED ORDER — VENLAFAXINE HCL ER 75 MG PO CP24: 75.0000 mg | ORAL_CAPSULE | Freq: Every day | ORAL | 1 refills | Status: DC

## 2017-05-09 MED ORDER — CETIRIZINE HCL 10 MG PO TABS: 10.0000 mg | ORAL_TABLET | Freq: Every day | ORAL | 11 refills | Status: DC

## 2017-05-09 MED ORDER — VENLAFAXINE HCL ER 150 MG PO CP24: 150.0000 mg | ORAL_CAPSULE | Freq: Every day | ORAL | 3 refills | Status: DC

## 2017-05-09 NOTE — Progress Notes (Signed)
BP 118/79 (BP Location: Left Arm, Patient Position: Sitting, Cuff Size: Normal)   Pulse 71   Temp 97.7 F (36.5 C)   Wt 241 lb 9 oz (109.6 kg)   SpO2 96%   BMI 42.79 kg/m    Subjective:    Patient ID: Kaylee Fox, female    DOB: 23-Feb-1961, 57 y.o.   MRN: [id number]  HPI: Kaylee FlorasRobin Duval is a 57 y.o. female  Chief Complaint  Patient presents with  . Depression   UPPER RESPIRATORY TRACT INFECTION Duration: 1 week Worst symptom: laryngitis Fever: no Cough: yes- better Shortness of breath: no Wheezing: no Chest pain: no Chest tightness: no Chest congestion: no Nasal congestion: yes Runny nose: yes Post nasal drip: no Sneezing: yes Sore throat: yes Swollen glands: no Sinus pressure: yes Headache: yes Face pain: no Toothache: no Ear pain: yes "right Ear pressure: no  Eyes red/itching: yes Eye drainage/crusting: no  Vomiting: no Rash: no Fatigue: no Sick contacts: no Strep contacts: no  Context: stable Recurrent sinusitis: no Relief with OTC cold/cough medications: no  Treatments attempted: cough syrup   DEPRESSION Mood status: better Satisfied with current treatment?: yes Symptom severity: mild  Duration of current treatment : months Side effects: no Medication compliance: excellent compliance Psychotherapy/counseling: no  Depressed mood: no Anxious mood: no Anhedonia: no Significant weight loss or gain: no Insomnia: no  Fatigue: no Feelings of worthlessness or guilt: no Impaired concentration/indecisiveness: no Suicidal ideations: no Hopelessness: no Crying spells: no Depression screen [facility] 2/9 05/09/2017 04/08/2017 08/20/2016 06/17/2016 04/19/2016  Decreased Interest 0 2 0 0 0  Down, Depressed, Hopeless 0 2 0 0 0  PHQ - 2 Score 0 4 0 0 0  Altered sleeping 2 2 2  - -  Tired, decreased energy 1 2 2  - -  Change in appetite 0 1 0 - -  Feeling bad or failure about yourself  0 1 0 - -  Trouble concentrating 0 0 2 - -  Moving slowly or fidgety/restless  0 0 0 - -  Suicidal thoughts 0 0 0 - -  PHQ-9 Score 3 10 6  - -  Difficult doing work/chores Not difficult at all - - - -     Relevant past medical, surgical, family and social history reviewed and updated as indicated. Interim medical history since our last visit reviewed. Allergies and medications reviewed and updated.  Review of Systems  Constitutional: Negative.   HENT: Positive for congestion, rhinorrhea, sinus pressure, sneezing, sore throat and voice change. Negative for dental problem, drooling, ear discharge, ear pain, facial swelling, hearing loss, mouth sores, nosebleeds, postnasal drip, sinus pain, tinnitus and trouble swallowing.   Respiratory: Negative.   Cardiovascular: Negative.   Psychiatric/Behavioral: Negative.     Per HPI unless specifically indicated above     Objective:    BP 118/79 (BP Location: Left Arm, Patient Position: Sitting, Cuff Size: Normal)   Pulse 71   Temp 97.7 F (36.5 C)   Wt 241 lb 9 oz (109.6 kg)   SpO2 96%   BMI 42.79 kg/m   Wt Readings from Last 3 Encounters:  05/09/17 241 lb 9 oz (109.6 kg)  05/03/17 242 lb (109.8 kg)  04/12/17 249 lb 9.6 oz (113.2 kg)    Physical Exam  Constitutional: She is oriented to person, place, and time. She appears well-developed and well-nourished. No distress.  HENT:  Head: Normocephalic and atraumatic.  Right Ear: Hearing and external ear normal.  Left Ear: Hearing and external ear normal.  Nose: Nose normal.  Mouth/Throat: Oropharynx is clear and moist. No oropharyngeal exudate.  Eyes: Conjunctivae, EOM and lids are normal. Pupils are equal, round, and reactive to light. Right eye exhibits no discharge. Left eye exhibits no discharge. No scleral icterus.  Neck: Normal range of motion. Neck supple. No JVD present. No tracheal deviation present. No thyromegaly present.  Cardiovascular: Normal rate, regular rhythm, normal heart sounds and intact distal pulses. Exam reveals no gallop and no friction rub.    No murmur heard. Pulmonary/Chest: Effort normal and breath sounds normal. No stridor. No respiratory distress. She has no wheezes. She has no rales. She exhibits no tenderness.  Musculoskeletal: Normal range of motion.  Lymphadenopathy:    She has no cervical adenopathy.  Neurological: She is alert and oriented to person, place, and time.  Skin: Skin is warm, dry and intact. No rash noted. She is not diaphoretic. No erythema. No pallor.  Psychiatric: She has a normal mood and affect. Her speech is normal and behavior is normal. Judgment and thought content normal. Cognition and memory are normal.  Nursing note and vitals reviewed.   Results for orders placed or performed in visit on 04/08/17  Veritor Flu A/B Waived  Result Value Ref Range   Influenza A Negative Negative   Influenza B Negative Negative      Assessment & Plan:   Problem List Items Addressed This Visit      Other   Depression - Primary    Doing much better on current regimen. Continue current regimen. Continue to monitor. Call with any concerns. Refills given today.      Relevant Medications   venlafaxine XR (EFFEXOR-XR) 150 MG 24 hr capsule   venlafaxine XR (EFFEXOR XR) 75 MG 24 hr capsule    Other Visit Diagnoses    Seasonal allergic rhinitis due to pollen       Will start on zyrtec. Call with any concerns or if not doing better or if getting worse.        Follow up plan: Return in about 6 months (around 11/09/2017) for Physical.

## 2017-05-09 NOTE — Assessment & Plan Note (Signed)
Doing much better on current regimen. Continue current regimen. Continue to monitor. Call with any concerns. Refills given today. 

## 2017-05-27 ENCOUNTER — Ambulatory Visit (INDEPENDENT_AMBULATORY_CARE_PROVIDER_SITE_OTHER): Payer: Medicare HMO | Admitting: Vascular Surgery

## 2017-06-06 ENCOUNTER — Other Ambulatory Visit: Payer: Self-pay | Admitting: Family Medicine

## 2017-06-07 ENCOUNTER — Other Ambulatory Visit: Payer: Self-pay | Admitting: Family Medicine

## 2017-07-04 ENCOUNTER — Other Ambulatory Visit: Payer: Self-pay | Admitting: Family Medicine

## 2017-07-04 ENCOUNTER — Ambulatory Visit (INDEPENDENT_AMBULATORY_CARE_PROVIDER_SITE_OTHER): Payer: Medicare HMO | Admitting: Family Medicine

## 2017-07-04 ENCOUNTER — Encounter: Payer: Self-pay | Admitting: Family Medicine

## 2017-07-04 VITALS — BP 131/87 | HR 66 | Temp 97.1°F | Wt 237.0 lb

## 2017-07-04 DIAGNOSIS — J301 Allergic rhinitis due to pollen: Secondary | ICD-10-CM

## 2017-07-04 MED ORDER — DOXYCYCLINE HYCLATE 100 MG PO TABS: 100.0000 mg | ORAL_TABLET | Freq: Two times a day (BID) | ORAL | 0 refills | Status: DC

## 2017-07-04 MED ORDER — MONTELUKAST SODIUM 10 MG PO TABS: 10.0000 mg | ORAL_TABLET | Freq: Every day | ORAL | 3 refills | Status: DC

## 2017-07-04 NOTE — Progress Notes (Signed)
BP 131/87 (BP Location: Left Arm, Patient Position: Sitting, Cuff Size: Normal)   Pulse 66   Temp (!) 97.1 F (36.2 C)   Wt 237 lb (107.5 kg)   SpO2 97%   BMI 41.98 kg/m    Subjective:    Patient ID: Kaylee Fox, female    DOB: January 13, 1961, 57 y.o.   MRN: [id number]  HPI: Kaylee Fox is a 57 y.o. female  Chief Complaint  Patient presents with  . URI    sinus pain and pressure, headache X 3 weeks   UPPER RESPIRATORY TRACT INFECTION Duration: 3 weeks Worst symptom: nasal congestion, pressure Fever: no Cough: no Shortness of breath: no Wheezing: no Chest pain: no Chest tightness: no Chest congestion: no Nasal congestion: yes Runny nose: no Post nasal drip: no Sneezing: no Sore throat: no Swollen glands: no Sinus pressure: yes Headache: yes Face pain: no Toothache: no Ear pain: no  Ear pressure: yes "right Eyes red/itching:yes Eye drainage/crusting: no  Vomiting: no Rash: no Fatigue: yes Sick contacts: no Strep contacts: no  Context: worse Recurrent sinusitis: no Relief with OTC cold/cough medications: no  Treatments attempted: flonase and anti-histamine   Relevant past medical, surgical, family and social history reviewed and updated as indicated. Interim medical history since our last visit reviewed. Allergies and medications reviewed and updated.  Review of Systems  Constitutional: Negative.   HENT: Positive for congestion and sinus pressure. Negative for dental problem, drooling, ear discharge, ear pain, facial swelling, hearing loss, mouth sores, nosebleeds, postnasal drip, rhinorrhea, sinus pain, sneezing, sore throat, tinnitus, trouble swallowing and voice change.   Eyes: Negative.   Respiratory: Negative.   Cardiovascular: Negative.   Musculoskeletal: Positive for back pain and myalgias. Negative for arthralgias, gait problem, joint swelling, neck pain and neck stiffness.  Skin: Negative.   Neurological: Negative.   Psychiatric/Behavioral:  Negative.     Per HPI unless specifically indicated above     Objective:    BP 131/87 (BP Location: Left Arm, Patient Position: Sitting, Cuff Size: Normal)   Pulse 66   Temp (!) 97.1 F (36.2 C)   Wt 237 lb (107.5 kg)   SpO2 97%   BMI 41.98 kg/m   Wt Readings from Last 3 Encounters:  07/04/17 237 lb (107.5 kg)  05/09/17 241 lb 9 oz (109.6 kg)  05/03/17 242 lb (109.8 kg)    Physical Exam  Constitutional: She is oriented to person, place, and time. She appears well-developed and well-nourished. No distress.  HENT:  Head: Normocephalic and atraumatic.  Right Ear: Hearing and external ear normal.  Left Ear: Hearing and external ear normal.  Nose: Nose normal.  Mouth/Throat: Oropharynx is clear and moist. No oropharyngeal exudate.  Eyes: Pupils are equal, round, and reactive to light. Conjunctivae, EOM and lids are normal. Right eye exhibits no discharge. Left eye exhibits no discharge. No scleral icterus.  Neck: Normal range of motion. Neck supple. No JVD present. No tracheal deviation present. No thyromegaly present.  Cardiovascular: Normal rate, regular rhythm, normal heart sounds and intact distal pulses. Exam reveals no gallop and no friction rub.  No murmur heard. Pulmonary/Chest: Effort normal and breath sounds normal. No stridor. No respiratory distress. She has no wheezes. She has no rales. She exhibits no tenderness.  Musculoskeletal: Normal range of motion.  Lymphadenopathy:    She has no cervical adenopathy.  Neurological: She is alert and oriented to person, place, and time.  Skin: Skin is intact. No rash noted. She is not diaphoretic.  Psychiatric: She has a normal mood and affect. Her speech is normal and behavior is normal. Judgment and thought content normal. Cognition and memory are normal.  Nursing note and vitals reviewed.   Results for orders placed or performed in visit on 04/08/17  Veritor Flu A/B Waived  Result Value Ref Range   Influenza A Negative  Negative   Influenza B Negative Negative      Assessment & Plan:   Problem List Items Addressed This Visit    None    Visit Diagnoses    Seasonal allergic rhinitis due to pollen    -  Primary   Will add singulair. If not better in 4-5 days, will treat with abx given chronicity. Call with any concerns or if not getting better.        Follow up plan: Return if symptoms worsen or fail to improve.

## 2017-07-12 ENCOUNTER — Ambulatory Visit (INDEPENDENT_AMBULATORY_CARE_PROVIDER_SITE_OTHER): Payer: Medicare HMO | Admitting: Vascular Surgery

## 2017-07-12 DIAGNOSIS — Z9884 Bariatric surgery status: Secondary | ICD-10-CM | POA: Diagnosis not present

## 2017-07-15 ENCOUNTER — Other Ambulatory Visit: Payer: Self-pay | Admitting: Family Medicine

## 2017-07-18 DIAGNOSIS — Z79899 Other long term (current) drug therapy: Secondary | ICD-10-CM | POA: Diagnosis not present

## 2017-07-18 DIAGNOSIS — Z5181 Encounter for therapeutic drug level monitoring: Secondary | ICD-10-CM | POA: Diagnosis not present

## 2017-07-19 ENCOUNTER — Ambulatory Visit (INDEPENDENT_AMBULATORY_CARE_PROVIDER_SITE_OTHER): Payer: Medicare HMO | Admitting: Vascular Surgery

## 2017-07-19 ENCOUNTER — Encounter (INDEPENDENT_AMBULATORY_CARE_PROVIDER_SITE_OTHER): Payer: Self-pay | Admitting: Vascular Surgery

## 2017-07-19 VITALS — BP 140/92 | HR 68 | Resp 17 | Ht 64.0 in | Wt 237.0 lb

## 2017-07-19 DIAGNOSIS — M7989 Other specified soft tissue disorders: Secondary | ICD-10-CM

## 2017-07-19 DIAGNOSIS — I83813 Varicose veins of bilateral lower extremities with pain: Secondary | ICD-10-CM

## 2017-07-19 NOTE — Assessment & Plan Note (Signed)
Better but not resolved after laser ablation

## 2017-07-19 NOTE — Assessment & Plan Note (Signed)
Recommend:  The patient has had successful ablation of the previously incompetent saphenous venous system but still has persistent symptoms of pain and swelling that are having a negative impact on daily life and daily activities.  Patient should undergo injection sclerotherapy to treat the residual varicosities.  The risks, benefits and alternative therapies were reviewed in detail with the patient.  All questions were answered.  The patient agrees to proceed with sclerotherapy at their convenience.  In her case, the foam sclerotherapy would likely be concentrated on the large accessory saphenous vein branches located on the more lateral part of the leg that are still causing symptoms.  The patient will continue wearing the graduated compression stockings and using the over-the-counter pain medications to treat her symptoms.

## 2017-07-19 NOTE — Progress Notes (Signed)
MRN : [id number]  Kaylee Fox is a 57 y.o. (19-Mar-1960) female who presents with chief complaint of  Chief Complaint  Patient presents with  . Follow-up    3-4 week post laser  .  History of Present Illness: Patient returns today in follow up of venous insufficiency.  She has undergone successful bilateral great saphenous vein laser ablations with improvement but not resolution in her pain and swelling.  She still has prominent anterior accessory saphenous vein branches bilaterally which still create pain and swelling particularly in the lateral parts of the leg.  No new ulceration or infection.  She continues to lose weight after her bariatric surgery.  Current Outpatient Medications  Medication Sig Dispense Refill  . acetaminophen (TYLENOL) 325 MG tablet Take by mouth.    Marland Kitchen albuterol (PROVENTIL HFA;VENTOLIN HFA) 108 (90 Base) MCG/ACT inhaler Inhale 2 puffs into the lungs every 6 (six) hours as needed for wheezing or shortness of breath. 1 Inhaler 0  . ALPRAZolam (XANAX) 0.5 MG tablet     . amLODipine (NORVASC) 10 MG tablet TAKE 1 TABLET EVERY DAY 90 tablet 1  . azaTHIOprine (IMURAN) 50 MG tablet Take 100 mg by mouth daily.    . baclofen (LIORESAL) 10 MG tablet TAKE 1 TABLET DAILY AS NEEDED FOR MUSCLE SPASMS. 90 tablet 1  . calcium carbonate (CALCIUM 600) 600 MG TABS tablet Take by mouth.    . cetirizine (ZYRTEC) 10 MG tablet Take 1 tablet (10 mg total) by mouth daily. 30 tablet 11  . Cholecalciferol (D3-1000) 1000 units capsule Take 1,000 Units by mouth daily. Takes 2 1000 mg per day    . doxycycline (VIBRA-TABS) 100 MG tablet Take 1 tablet (100 mg total) by mouth 2 (two) times daily. 20 tablet 0  . ELIQUIS 5 MG TABS tablet TAKE 1 TABLET TWICE DAILY 180 tablet 1  . fluticasone (FLONASE) 50 MCG/ACT nasal spray USE TWO SPRAYS IN EACH NOSTRIL EVERY DAY 16 g 0  . furosemide (LASIX) 40 MG tablet TAKE 2 TABLETS TWICE DAILY 360 tablet 1  . gabapentin (NEURONTIN) 300 MG capsule TAKE 2  CAPSULES TWICE DAILY 360 capsule 1  . HYDROcodone-acetaminophen (NORCO) 10-325 MG tablet Take by mouth.    . hydroxychloroquine (PLAQUENIL) 200 MG tablet Take 200 mg by mouth. Take 3 200 mg tabs a day    . hydrOXYzine (ATARAX/VISTARIL) 25 MG tablet TAKE 1 TABLET BY MOUTH AT BEDTIME AS NEEDED 30 tablet 6  . levothyroxine (SYNTHROID, LEVOTHROID) 75 MCG tablet TAKE 1 TABLET BY MOUTH DAILY BEFORE BREAKFAST 90 tablet 1  . montelukast (SINGULAIR) 10 MG tablet Take 1 tablet (10 mg total) by mouth at bedtime. 30 tablet 3  . Multiple Vitamin (MULTI-VITAMINS) TABS Take by mouth.    . mupirocin cream (BACTROBAN) 2 % Apply to affected area on arms/legs twice a day as needed.    . pantoprazole (PROTONIX) 40 MG tablet TAKE 1 TABLET EVERY DAY 90 tablet 1  . potassium chloride SA (KLOR-CON M20) 20 MEQ tablet Take 1 tablet (20 mEq total) by mouth daily. 90 tablet 1  . venlafaxine XR (EFFEXOR XR) 75 MG 24 hr capsule Take 1 capsule (75 mg total) by mouth daily with breakfast. 90 capsule 1  . venlafaxine XR (EFFEXOR-XR) 150 MG 24 hr capsule Take 1 capsule (150 mg total) by mouth daily with breakfast. 90 capsule 1  . VOLTAREN 1 % GEL      No current facility-administered medications for this visit.     Past  Medical History:  Diagnosis Date  . Anticoagulant long-term use   . Anxiety   . Autoimmune hypothyroidism   . Chronic hypokalemia   . Chronic pain   . Chronic, continuous use of opioids   . Depression   . Diverticulosis   . Fatigue   . Fibromyalgia   . Herpes labialis   . Herpes simplex   . History of pulmonary embolus (PE)   . History of pulmonary hypertension   . Hypertension   . Lupus (systemic lupus erythematosus) (HCC)   . Neuromuscular disorder (HCC)   . OSA on CPAP   . Osteoporosis   . Vitamin D deficiency     Past Surgical History:  Procedure Laterality Date  . CESAREAN SECTION    . CHOLECYSTECTOMY    . COLONOSCOPY WITH PROPOFOL N/A 07/16/2016   Procedure: COLONOSCOPY WITH  PROPOFOL;  Surgeon: Wyline Mood, MD;  Location[facility] ENDOSCOPY;  Service: Endoscopy;  Laterality: N/A;    Social History Social History   Tobacco Use  . Smoking status: Never Smoker  . Smokeless tobacco: Never Used  Substance Use Topics  . Alcohol use: No  . Drug use: No      Family History Family History  Problem Relation Age of Onset  . Arthritis Mother   . Osteoarthritis Mother   . Lupus Mother   . Arthritis Father   . Osteoarthritis Father   . Lupus Father      Allergies  Allergen Reactions  . Penicillins Rash  . Clindamycin Nausea Only  . Ciprofloxacin Other (See Comments)  . Morphine Other (See Comments)     REVIEW OF SYSTEMS (Negative unless checked)  Constitutional: Weight loss  Fever  Chills Cardiac: Chest pain   Chest pressure   Palpitations   Shortness of breath when laying flat   Shortness of breath at rest   Shortness of breath with exertion. Vascular:  Pain in legs with walking   Pain in legs at rest   Pain in legs when laying flat   Claudication   Pain in feet when walking  Pain in feet at rest  Pain in feet when laying flat   History of DVT   Phlebitis   Swelling in legs   Varicose veins   Non-healing ulcers Pulmonary:   Uses home oxygen   Productive cough   Hemoptysis   Wheeze  COPD   Asthma Neurologic:  Dizziness  Blackouts   Seizures   History of stroke   History of TIA  Aphasia   Temporary blindness   Dysphagia   Weakness or numbness in arms   Weakness or numbness in legs Musculoskeletal:  Arthritis   Joint swelling   Joint pain   Low back pain Hematologic:  Easy bruising  Easy bleeding   Hypercoagulable state   Anemic   Gastrointestinal:  Blood in stool   Vomiting blood  Gastroesophageal reflux/heartburn   Abdominal pain Genitourinary:  Chronic kidney disease   Difficult urination  Frequent urination  Burning with urination    Hematuria Skin:  Rashes   Ulcers   Wounds Psychological:  History of anxiety    History of major depression.  Physical Examination  BP (!) 140/92 (BP Location: Right Arm, Patient Position: Sitting)   Pulse 68   Resp 17   Ht  (1.626 m)   Wt 237 lb (107.5 kg)   BMI 40.68 kg/m  Gen:  WD/WN, NAD Head: [place]/AT, No temporalis wasting. Ear/Nose/Throat: Hearing grossly intact, nares w/o erythema or  drainage Eyes: Conjunctiva clear. Sclera non-icteric Neck: Supple.  Trachea midline Pulmonary:  Good air movement, no use of accessory muscles.  Cardiac: RRR, no JVD Vascular: Prominent varicosities coursing across the anterior thigh laterally and then going down into the lower leg on the lateral aspect consistent with varicosities of the anterior accessory saphenous system.  These measure 3 to 4 mm in diameter. Vessel Right Left  Radial Palpable Palpable                          PT Palpable Palpable  DP Palpable Palpable   Musculoskeletal: M/S 5/5 throughout.  No deformity or atrophy.  Mild to moderate stasis dermatitis changes bilaterally.  1+ bilateral lower extremity edema. Neurologic: Sensation grossly intact in extremities.  Symmetrical.  Speech is fluent.  Psychiatric: Judgment intact, Mood & affect appropriate for pt's clinical situation. Dermatologic: No rashes or ulcers noted.  No cellulitis or open wounds.       Labs No results found for this or any previous visit (from the past 2160 hour(s)).  Radiology No results found.  Assessment/Plan  Swelling of limb Better but not resolved after laser ablation  Varicose veins of both lower extremities with pain Recommend:  The patient has had successful ablation of the previously incompetent saphenous venous system but still has persistent symptoms of pain and swelling that are having a negative impact on daily life and daily activities.  Patient should undergo injection sclerotherapy to treat the residual  varicosities.  The risks, benefits and alternative therapies were reviewed in detail with the patient.  All questions were answered.  The patient agrees to proceed with sclerotherapy at their convenience.  In her case, the foam sclerotherapy would likely be concentrated on the large accessory saphenous vein branches located on the more lateral part of the leg that are still causing symptoms.  The patient will continue wearing the graduated compression stockings and using the over-the-counter pain medications to treat her symptoms.         Festus Barren, MD  07/19/2017 2:19 PM    This note was created with Dragon medical transcription system.  Any errors from dictation are purely unintentional

## 2017-07-19 NOTE — Patient Instructions (Signed)
Varicose Vein Surgery, Care After Refer to this sheet in the next few weeks. These instructions provide you with information about caring for yourself after your procedure. Your health care provider may also give you more specific instructions. Your treatment has been planned according to current medical practices, but problems sometimes occur. Call your health care provider if you have any problems or questions after your procedure. What can I expect after the procedure? After your procedure, it is typical to have the following:  Swelling.  Bruising.  Soreness.  Mild skin discoloration.  Slight bleeding at incision sites.  Follow these instructions at home:  Take medicines only as directed by your health care provider.  Wear compression stockings as directed by your health care provider. These stockings help to prevent blood clots and reduce swelling in your legs.  There are many different ways to close and cover an incision, including stitches (sutures), skin glue, and adhesive strips. Follow your health care provider's instructions on: ? Incision care. ? Bandage (dressing) changes and removal. ? Incision closure removal.  Wear loose-fitting clothing.  Get regular daily exercise. Walk or ride a stationary bike daily or as directed by your health care provider.  Ask your health care provider when you can return to work. This may depend on the type of work you do.  Be patient with your recovery. It can take up to 4 weeks to get back to your usual activities. Contact a health care provider if:  You have a fever.  You have drainage, redness, swelling, or pain at an incision site.  You develop a cough. Get help right away if:  You pass out.  You have very bad pain in your leg.  You have leg pain that gets worse when you walk.  You have redness or swelling in your leg that is getting worse.  You have trouble breathing.  You cough up blood. This information is not  intended to replace advice given to you by your health care provider. Make sure you discuss any questions you have with your health care provider. Document Released: 10/26/2013 Document Revised: 07/31/2015 Document Reviewed: 08/01/2013 [employer]vier Interactive Patient Education  2018 [employer]  

## 2017-07-20 DIAGNOSIS — Z5181 Encounter for therapeutic drug level monitoring: Secondary | ICD-10-CM | POA: Diagnosis not present

## 2017-07-20 DIAGNOSIS — Z79899 Other long term (current) drug therapy: Secondary | ICD-10-CM | POA: Diagnosis not present

## 2017-07-25 DIAGNOSIS — Z79899 Other long term (current) drug therapy: Secondary | ICD-10-CM | POA: Diagnosis not present

## 2017-07-25 DIAGNOSIS — Z5181 Encounter for therapeutic drug level monitoring: Secondary | ICD-10-CM | POA: Diagnosis not present

## 2017-07-27 DIAGNOSIS — Z5181 Encounter for therapeutic drug level monitoring: Secondary | ICD-10-CM | POA: Diagnosis not present

## 2017-07-27 DIAGNOSIS — Z79899 Other long term (current) drug therapy: Secondary | ICD-10-CM | POA: Diagnosis not present

## 2017-08-02 DIAGNOSIS — Z79899 Other long term (current) drug therapy: Secondary | ICD-10-CM | POA: Diagnosis not present

## 2017-08-02 DIAGNOSIS — Z5181 Encounter for therapeutic drug level monitoring: Secondary | ICD-10-CM | POA: Diagnosis not present

## 2017-08-03 DIAGNOSIS — Z79899 Other long term (current) drug therapy: Secondary | ICD-10-CM | POA: Diagnosis not present

## 2017-08-03 DIAGNOSIS — M329 Systemic lupus erythematosus, unspecified: Secondary | ICD-10-CM | POA: Diagnosis not present

## 2017-08-03 DIAGNOSIS — E559 Vitamin D deficiency, unspecified: Secondary | ICD-10-CM | POA: Diagnosis not present

## 2017-08-04 DIAGNOSIS — Z5181 Encounter for therapeutic drug level monitoring: Secondary | ICD-10-CM | POA: Diagnosis not present

## 2017-08-04 DIAGNOSIS — Z79899 Other long term (current) drug therapy: Secondary | ICD-10-CM | POA: Diagnosis not present

## 2017-08-05 DIAGNOSIS — H2513 Age-related nuclear cataract, bilateral: Secondary | ICD-10-CM | POA: Diagnosis not present

## 2017-08-05 DIAGNOSIS — H43811 Vitreous degeneration, right eye: Secondary | ICD-10-CM | POA: Diagnosis not present

## 2017-08-05 DIAGNOSIS — Z79899 Other long term (current) drug therapy: Secondary | ICD-10-CM | POA: Diagnosis not present

## 2017-08-05 DIAGNOSIS — H35033 Hypertensive retinopathy, bilateral: Secondary | ICD-10-CM | POA: Diagnosis not present

## 2017-08-18 DIAGNOSIS — Z79899 Other long term (current) drug therapy: Secondary | ICD-10-CM | POA: Diagnosis not present

## 2017-08-18 DIAGNOSIS — Z5181 Encounter for therapeutic drug level monitoring: Secondary | ICD-10-CM | POA: Diagnosis not present

## 2017-08-22 ENCOUNTER — Ambulatory Visit (INDEPENDENT_AMBULATORY_CARE_PROVIDER_SITE_OTHER): Payer: Medicare HMO

## 2017-08-22 VITALS — BP 116/68 | HR 69 | Temp 98.4°F | Resp 17 | Ht 63.0 in | Wt 236.9 lb

## 2017-08-22 DIAGNOSIS — Z0000 Encounter for general adult medical examination without abnormal findings: Secondary | ICD-10-CM | POA: Diagnosis not present

## 2017-08-22 DIAGNOSIS — Z1159 Encounter for screening for other viral diseases: Secondary | ICD-10-CM

## 2017-08-22 DIAGNOSIS — Z114 Encounter for screening for human immunodeficiency virus [HIV]: Secondary | ICD-10-CM | POA: Diagnosis not present

## 2017-08-22 NOTE — Progress Notes (Signed)
Subjective:   Kaylee Fox is a 57 y.o. female who presents for Medicare Annual (Subsequent) preventive examination.  Review of Systems:   Cardiac Risk Factors include: advanced age (>21men, >27 women);obesity (BMI >30kg/m2);hypertension     Objective:     Vitals: BP 116/68 (BP Location: Left Arm, Patient Position: Sitting)   Pulse 69   Temp 98.4 F (36.9 C) (Temporal)   Resp 17   Ht 5\' 3"  (1.6 m)   Wt 236 lb 14.4 oz (107.5 kg)   BMI 41.96 kg/m   Body mass index is 41.96 kg/m.  Advanced Directives 08/22/2017 11/09/2016 08/20/2016 07/13/2016 06/22/2016  Does Patient Have a Medical Advance Directive? No No No No No  Would patient like information on creating a medical advance directive? Yes (MAU/Ambulatory/Procedural Areas - Information given) - Yes (MAU/Ambulatory/Procedural Areas - Information given) - -    Tobacco Social History   Tobacco Use  Smoking Status Never Smoker  Smokeless Tobacco Never Used     Counseling given: Not Answered   Clinical Intake:  Pre-visit preparation completed: Yes  Pain : No/denies pain     Nutritional Status: BMI > 30  Obese Nutritional Risks: None Diabetes: No  How often do you need to have someone help you when you read instructions, pamphlets, or other written materials from your doctor or pharmacy?: 1 - Never What is the last grade level you completed in school?: 12th grade  Interpreter Needed?: No  Information entered by :: Nyle Limb,LPN   Past Medical History:  Diagnosis Date  . Anticoagulant long-term use   . Anxiety   . Autoimmune hypothyroidism   . Chronic hypokalemia   . Chronic pain   . Chronic, continuous use of opioids   . Depression   . Diverticulosis   . Fatigue   . Fibromyalgia   . Herpes labialis   . Herpes simplex   . History of pulmonary embolus (PE)   . History of pulmonary hypertension   . Hypertension   . Lupus (systemic lupus erythematosus) (HCC)   . Neuromuscular disorder (HCC)   . OSA on  CPAP   . Osteoporosis   . Vitamin D deficiency    Past Surgical History:  Procedure Laterality Date  . CESAREAN SECTION    . CHOLECYSTECTOMY    . COLONOSCOPY WITH PROPOFOL N/A 07/16/2016   Procedure: COLONOSCOPY WITH PROPOFOL;  Surgeon: Wyline Mood, MD;  Location: [facility] ENDOSCOPY;  Service: Endoscopy;  Laterality: N/A;   Family History  Problem Relation Age of Onset  . Arthritis Mother   . Osteoarthritis Mother   . Lupus Mother   . Arthritis Father   . Osteoarthritis Father   . Lupus Father    Social History   Socioeconomic History  . Marital status: Legally Separated    Spouse name: Not on file  . Number of children: Not on file  . Years of education: Not on file  . Highest education level: Not on file  Occupational History  . Not on file  Social Needs  . Financial resource strain: Not hard at all  . Food insecurity:    Worry: Never true    Inability: Never true  . Transportation needs:    Medical: No    Non-medical: No  Tobacco Use  . Smoking status: Never Smoker  . Smokeless tobacco: Never Used  Substance and Sexual Activity  . Alcohol use: No  . Drug use: No  . Sexual activity: Yes  Lifestyle  . Physical activity:  Days per week: 0 days    Minutes per session: 0 min  . Stress: Not at all  Relationships  . Social connections:    Talks on phone: More than three times a week    Gets together: More than three times a week    Attends religious service: More than 4 times per year    Active member of club or organization: No    Attends meetings of clubs or organizations: Never    Relationship status: Separated  Other Topics Concern  . Not on file  Social History Narrative  . Not on file    Outpatient Encounter Medications as of 08/22/2017  Medication Sig  . acetaminophen (TYLENOL) 325 MG tablet Take by mouth.  Marland Kitchen albuterol (PROVENTIL HFA;VENTOLIN HFA) 108 (90 Base) MCG/ACT inhaler Inhale 2 puffs into the lungs every 6 (six) hours as needed for wheezing or  shortness of breath.  . ALPRAZolam (XANAX) 0.5 MG tablet at bedtime as needed.   Marland Kitchen amLODipine (NORVASC) 10 MG tablet TAKE 1 TABLET EVERY DAY  . baclofen (LIORESAL) 10 MG tablet TAKE 1 TABLET DAILY AS NEEDED FOR MUSCLE SPASMS.  . calcium carbonate (CALCIUM 600) 600 MG TABS tablet Take by mouth.  . cetirizine (ZYRTEC) 10 MG tablet Take 1 tablet (10 mg total) by mouth daily.  . Cholecalciferol (D3-1000) 1000 units capsule Take 1,000 Units by mouth daily. Takes 2 1000 mg per day  . ELIQUIS 5 MG TABS tablet TAKE 1 TABLET TWICE DAILY  . fluticasone (FLONASE) 50 MCG/ACT nasal spray USE TWO SPRAYS IN EACH NOSTRIL EVERY DAY  . furosemide (LASIX) 40 MG tablet TAKE 2 TABLETS TWICE DAILY  . gabapentin (NEURONTIN) 300 MG capsule TAKE 2 CAPSULES TWICE DAILY  . hydroxychloroquine (PLAQUENIL) 200 MG tablet Take 200 mg by mouth. Take 3 200 mg tabs a day  . hydrOXYzine (ATARAX/VISTARIL) 25 MG tablet TAKE 1 TABLET BY MOUTH AT BEDTIME AS NEEDED  . levothyroxine (SYNTHROID, LEVOTHROID) 75 MCG tablet TAKE 1 TABLET BY MOUTH DAILY BEFORE BREAKFAST  . montelukast (SINGULAIR) 10 MG tablet Take 1 tablet (10 mg total) by mouth at bedtime.  . Multiple Vitamin (MULTI-VITAMINS) TABS Take by mouth.  . mupirocin cream (BACTROBAN) 2 % Apply to affected area on arms/legs twice a day as needed.  . pantoprazole (PROTONIX) 40 MG tablet TAKE 1 TABLET EVERY DAY  . potassium chloride SA (KLOR-CON M20) 20 MEQ tablet Take 1 tablet (20 mEq total) by mouth daily.  Marland Kitchen venlafaxine XR (EFFEXOR XR) 75 MG 24 hr capsule Take 1 capsule (75 mg total) by mouth daily with breakfast.  . venlafaxine XR (EFFEXOR-XR) 150 MG 24 hr capsule Take 1 capsule (150 mg total) by mouth daily with breakfast.  . VOLTAREN 1 % GEL   . azaTHIOprine (IMURAN) 50 MG tablet Take 100 mg by mouth daily.  Marland Kitchen doxycycline (VIBRA-TABS) 100 MG tablet Take 1 tablet (100 mg total) by mouth 2 (two) times daily. (Patient not taking: Reported on 08/22/2017)  .  HYDROcodone-acetaminophen (NORCO) 10-325 MG tablet Take by mouth.   No facility-administered encounter medications on file as of 08/22/2017.     Activities of Daily Living In your present state of health, do you have any difficulty performing the following activities: 08/22/2017  Hearing? Y  Vision? N  Difficulty concentrating or making decisions? Y  Walking or climbing stairs? N  Dressing or bathing? N  Doing errands, shopping? N  Preparing Food and eating ? N  Using the Toilet? N  In the  past six months, have you accidently leaked urine? N  Do you have problems with loss of bowel control? N  Managing your Medications? N  Managing your Finances? N  Housekeeping or managing your Housekeeping? N  Some recent data might be hidden    Patient Care Team: Dorcas Carrow, DO as PCP - General (Family Medicine) Wyn Quaker, Marlow Baars, MD as Referring Physician (Vascular Surgery) Marcille Blanco, MD as Referring Physician (Surgery) Brayton El, MD as Referring Physician (Rheumatology)    Assessment:   This is a routine wellness examination for Kaylee Fox.  Exercise Activities and Dietary recommendations Current Exercise Habits: Structured exercise class, Type of exercise: walking;strength training/weights, Time (Minutes): 60, Frequency (Times/Week): 3, Weekly Exercise (Minutes/Week): 180, Intensity: Mild, Exercise limited by: None identified  Goals    . DIET - INCREASE WATER INTAKE     Recommend continue drinking at least 6-8 glasses of water a day        Fall Risk Fall Risk  08/22/2017 08/20/2016 05/18/2016 04/19/2016  Falls in the past year? No No No No   Is the patient's home free of loose throw rugs in walkways, pet beds, electrical cords, etc?   no      Grab bars in the bathroom? no      Handrails on the stairs?   no      Adequate lighting?   yes  Timed Get Up and Go performed: Completed in 8 seconds with no use of assistive devices, steady gait. No intervention needed at this time.    Depression Screen PHQ 2/9 Scores 08/22/2017 05/09/2017 04/08/2017 08/20/2016  PHQ - 2 Score 5 0 4 0  PHQ- 9 Score 6 3 10 6      Cognitive Function     6CIT Screen 08/22/2017 08/20/2016  What Year? 0 points 0 points  What month? 0 points 0 points  What time? 0 points 0 points  Count back from 20 0 points 0 points  Months in reverse 0 points 0 points  Repeat phrase 0 points 4 points  Total Score 0 4    Immunization History  Administered Date(s) Administered  . Influenza,inj,Quad PF,6+ Mos 12/10/2016  . Influenza-Unspecified 12/18/2013, 12/10/2016  . Pneumococcal Conjugate-13 05/04/2017  . Pneumococcal Polysaccharide-23 02/24/2010  . Tdap 08/22/2015    Qualifies for Shingles Vaccine? Yes, discussed shingrix vaccine  Screening Tests Health Maintenance  Topic Date Due  . Hepatitis C Screening  Apr 20, 1960  . HIV Screening  10/03/1976  . MAMMOGRAM  10/04/2011  . INFLUENZA VACCINE  10/06/2017  . COLONOSCOPY  07/16/2021  . PAP SMEAR  08/20/2021  . TETANUS/TDAP  08/21/2025    Cancer Screenings: Lung: Low Dose CT Chest recommended if Age 46-80 years, 30 pack-year currently smoking OR have quit w/in 15years. Patient does not qualify. Breast:  Up to date on Mammogram? No   Up to date of Bone Density/Dexa? Not indicated Colorectal: completed 07/16/2016  Additional Screenings:  Hepatitis C Screening: ordered     Plan:    I have personally reviewed and addressed the Medicare Annual Wellness questionnaire and have noted the following in the patient's chart:  A. Medical and social history B. Use of alcohol, tobacco or illicit drugs  C. Current medications and supplements D. Functional ability and status E.  Nutritional status F.  Physical activity G. Advance directives H. List of other physicians I.  Hospitalizations, surgeries, and ER visits in previous 12 months J.  Vitals K. Screenings such as hearing and vision if needed, cognitive and  depression L. Referrals and  appointments   In addition, I have reviewed and discussed with patient certain preventive protocols, quality metrics, and best practice recommendations. A written personalized care plan for preventive services as well as general preventive health recommendations were provided to patient.   Signed,  Marin Robertsiffany Dorothy Landgrebe, LPN Nurse Health Advisor   Nurse Notes:none

## 2017-08-22 NOTE — Patient Instructions (Addendum)
Kaylee Fox , Thank you for taking time to come for your Medicare Wellness Visit. I appreciate your ongoing commitment to your health goals. Please review the following plan we discussed and let me know if I can assist you in the future.   Screening recommendations/referrals: Colonoscopy: completed 07/16/2016 Mammogram: due now, Please call [phone] to schedule your mammogram.  Bone Density: due at age 57 Recommended yearly ophthalmology/optometry visit for glaucoma screening and checkup Recommended yearly dental visit for hygiene and checkup  Vaccinations: Influenza vaccine: up to date Pneumococcal vaccine: up to date  Tdap vaccine: up to date Shingles vaccine: shingrix eligible, check with your insurance company for coverage     Advanced directives: Advance directive discussed with you today. I have provided a copy for you to complete at home and have notarized. Once this is complete please bring a copy in to our office so we can scan it into your chart.  Conditions/risks identified: Recommend continue drinking at least 6-8 glasses of water a day   Next appointment: Follow up on 08/24/2017 at 10:45am with Dr.Johnson. Follow up in one year for your annual wellness exam.    Free hearing clinics offered in [facility]:   [address] [place], [place], [place] [address] [phone]  [facility] [place], [place], [place] [address] [phone]  Preventive Care 40-64 Years, Female Preventive care refers to lifestyle choices and visits with your health care provider that can promote health and wellness. What does preventive care include?  A yearly physical exam. This is also called an annual well check.  Dental exams once or twice a year.  Routine eye exams. Ask your health care provider how often you should have your eyes checked.  Personal lifestyle choices, including:  Daily care of your teeth and gums.  Regular physical  activity.  Eating a healthy diet.  Avoiding tobacco and drug use.  Limiting alcohol use.  Practicing safe sex.  Taking low-dose aspirin daily starting at age 5.  Taking vitamin and mineral supplements as recommended by your health care provider. What happens during an annual well check? The services and screenings done by your health care provider during your annual well check will depend on your age, overall health, lifestyle risk factors, and family history of disease. Counseling  Your health care provider may ask you questions about your:  Alcohol use.  Tobacco use.  Drug use.  Emotional well-being.  Home and relationship well-being.  Sexual activity.  Eating habits.  Work and work Statistician.  Method of birth control.  Menstrual cycle.  Pregnancy history. Screening  You may have the following tests or measurements:  Height, weight, and BMI.  Blood pressure.  Lipid and cholesterol levels. These may be checked every 5 years, or more frequently if you are over 46 years old.  Skin check.  Lung cancer screening. You may have this screening every year starting at age 53 if you have a 30-pack-year history of smoking and currently smoke or have quit within the past 15 years.  Fecal occult blood test (FOBT) of the stool. You may have this test every year starting at age 24.  Flexible sigmoidoscopy or colonoscopy. You may have a sigmoidoscopy every 5 years or a colonoscopy every 10 years starting at age 94.  Hepatitis C blood test.  Hepatitis B blood test.  Sexually transmitted disease (STD) testing.  Diabetes screening. This is done by checking your blood sugar (glucose) after you have  not eaten for a while (fasting). You may have this done every 1-3 years.  Mammogram. This may be done every 1-2 years. Talk to your health care provider about when you should start having regular mammograms. This may depend on whether you have a family history of breast  cancer.  BRCA-related cancer screening. This may be done if you have a family history of breast, ovarian, tubal, or peritoneal cancers.  Pelvic exam and Pap test. This may be done every 3 years starting at age 59. Starting at age 19, this may be done every 5 years if you have a Pap test in combination with an HPV test.  Bone density scan. This is done to screen for osteoporosis. You may have this scan if you are at high risk for osteoporosis. Discuss your test results, treatment options, and if necessary, the need for more tests with your health care provider. Vaccines  Your health care provider may recommend certain vaccines, such as:  Influenza vaccine. This is recommended every year.  Tetanus, diphtheria, and acellular pertussis (Tdap, Td) vaccine. You may need a Td booster every 10 years.  Zoster vaccine. You may need this after age 69.  Pneumococcal 13-valent conjugate (PCV13) vaccine. You may need this if you have certain conditions and were not previously vaccinated.  Pneumococcal polysaccharide (PPSV23) vaccine. You may need one or two doses if you smoke cigarettes or if you have certain conditions. Talk to your health care provider about which screenings and vaccines you need and how often you need them. This information is not intended to replace advice given to you by your health care provider. Make sure you discuss any questions you have with your health care provider. Document Released: 03/21/2015 Document Revised: 11/12/2015 Document Reviewed: 12/24/2014 Elsevier Interactive Patient Education  2017 [place] Prevention in the Home Falls can cause injuries. They can happen to people of all ages. There are many things you can do to make your home safe and to help prevent falls. What can I do on the outside of my home?  Regularly fix the edges of walkways and driveways and fix any cracks.  Remove anything that might make you trip as you walk through a door,  such as a raised step or threshold.  Trim any bushes or trees on the path to your home.  Use bright outdoor lighting.  Clear any walking paths of anything that might make someone trip, such as rocks or tools.  Regularly check to see if handrails are loose or broken. Make sure that both sides of any steps have handrails.  Any raised decks and porches should have guardrails on the edges.  Have any leaves, snow, or ice cleared regularly.  Use sand or salt on walking paths during winter.  Clean up any spills in your garage right away. This includes oil or grease spills. What can I do in the bathroom?  Use night lights.  Install grab bars by the toilet and in the tub and shower. Do not use towel bars as grab bars.  Use non-skid mats or decals in the tub or shower.  If you need to sit down in the shower, use a plastic, non-slip stool.  Keep the floor dry. Clean up any water that spills on the floor as soon as it happens.  Remove soap buildup in the tub or shower regularly.  Attach bath mats securely with double-sided non-slip rug tape.  Do not have throw rugs and other things  on the floor that can make you trip. What can I do in the bedroom?  Use night lights.  Make sure that you have a light by your bed that is easy to reach.  Do not use any sheets or blankets that are too big for your bed. They should not hang down onto the floor.  Have a firm chair that has side arms. You can use this for support while you get dressed.  Do not have throw rugs and other things on the floor that can make you trip. What can I do in the kitchen?  Clean up any spills right away.  Avoid walking on wet floors.  Keep items that you use a lot in easy-to-reach places.  If you need to reach something above you, use a strong step stool that has a grab bar.  Keep electrical cords out of the way.  Do not use floor polish or wax that makes floors slippery. If you must use wax, use non-skid  floor wax.  Do not have throw rugs and other things on the floor that can make you trip. What can I do with my stairs?  Do not leave any items on the stairs.  Make sure that there are handrails on both sides of the stairs and use them. Fix handrails that are broken or loose. Make sure that handrails are as long as the stairways.  Check any carpeting to make sure that it is firmly attached to the stairs. Fix any carpet that is loose or worn.  Avoid having throw rugs at the top or bottom of the stairs. If you do have throw rugs, attach them to the floor with carpet tape.  Make sure that you have a light switch at the top of the stairs and the bottom of the stairs. If you do not have them, ask someone to add them for you. What else can I do to help prevent falls?  Wear shoes that:  Do not have high heels.  Have rubber bottoms.  Are comfortable and fit you well.  Are closed at the toe. Do not wear sandals.  If you use a stepladder:  Make sure that it is fully opened. Do not climb a closed stepladder.  Make sure that both sides of the stepladder are locked into place.  Ask someone to hold it for you, if possible.  Clearly mark and make sure that you can see:  Any grab bars or handrails.  First and last steps.  Where the edge of each step is.  Use tools that help you move around (mobility aids) if they are needed. These include:  Canes.  Walkers.  Scooters.  Crutches.  Turn on the lights when you go into a dark area. Replace any light bulbs as soon as they burn out.  Set up your furniture so you have a clear path. Avoid moving your furniture around.  If any of your floors are uneven, fix them.  If there are any pets around you, be aware of where they are.  Review your medicines with your doctor. Some medicines can make you feel dizzy. This can increase your chance of falling. Ask your doctor what other things that you can do to help prevent falls. This  information is not intended to replace advice given to you by your health care provider. Make sure you discuss any questions you have with your health care provider. Document Released: 12/19/2008 Document Revised: 07/31/2015 Document Reviewed: 03/29/2014 Elsevier Interactive Patient Education  2017 [place].

## 2017-08-24 ENCOUNTER — Ambulatory Visit (INDEPENDENT_AMBULATORY_CARE_PROVIDER_SITE_OTHER): Payer: Medicare HMO | Admitting: Family Medicine

## 2017-08-24 ENCOUNTER — Encounter: Payer: Self-pay | Admitting: Family Medicine

## 2017-08-24 VITALS — BP 118/81 | HR 61 | Temp 98.5°F | Ht 63.0 in | Wt 234.0 lb

## 2017-08-24 DIAGNOSIS — I129 Hypertensive chronic kidney disease with stage 1 through stage 4 chronic kidney disease, or unspecified chronic kidney disease: Secondary | ICD-10-CM

## 2017-08-24 DIAGNOSIS — D899 Disorder involving the immune mechanism, unspecified: Secondary | ICD-10-CM

## 2017-08-24 DIAGNOSIS — E559 Vitamin D deficiency, unspecified: Secondary | ICD-10-CM

## 2017-08-24 DIAGNOSIS — M329 Systemic lupus erythematosus, unspecified: Secondary | ICD-10-CM

## 2017-08-24 DIAGNOSIS — N390 Urinary tract infection, site not specified: Secondary | ICD-10-CM | POA: Diagnosis not present

## 2017-08-24 DIAGNOSIS — L97919 Non-pressure chronic ulcer of unspecified part of right lower leg with unspecified severity: Secondary | ICD-10-CM

## 2017-08-24 DIAGNOSIS — Z0001 Encounter for general adult medical examination with abnormal findings: Secondary | ICD-10-CM

## 2017-08-24 DIAGNOSIS — Z114 Encounter for screening for human immunodeficiency virus [HIV]: Secondary | ICD-10-CM | POA: Diagnosis not present

## 2017-08-24 DIAGNOSIS — I872 Venous insufficiency (chronic) (peripheral): Secondary | ICD-10-CM | POA: Diagnosis not present

## 2017-08-24 DIAGNOSIS — Z7952 Long term (current) use of systemic steroids: Secondary | ICD-10-CM

## 2017-08-24 DIAGNOSIS — F331 Major depressive disorder, recurrent, moderate: Secondary | ICD-10-CM

## 2017-08-24 DIAGNOSIS — G709 Myoneural disorder, unspecified: Secondary | ICD-10-CM | POA: Diagnosis not present

## 2017-08-24 DIAGNOSIS — Z1159 Encounter for screening for other viral diseases: Secondary | ICD-10-CM | POA: Diagnosis not present

## 2017-08-24 DIAGNOSIS — E063 Autoimmune thyroiditis: Secondary | ICD-10-CM | POA: Diagnosis not present

## 2017-08-24 DIAGNOSIS — F1120 Opioid dependence, uncomplicated: Secondary | ICD-10-CM

## 2017-08-24 DIAGNOSIS — Z6841 Body Mass Index (BMI) 40.0 and over, adult: Secondary | ICD-10-CM | POA: Diagnosis not present

## 2017-08-24 DIAGNOSIS — Z0000 Encounter for general adult medical examination without abnormal findings: Secondary | ICD-10-CM

## 2017-08-24 DIAGNOSIS — R229 Localized swelling, mass and lump, unspecified: Secondary | ICD-10-CM

## 2017-08-24 DIAGNOSIS — IMO0002 Reserved for concepts with insufficient information to code with codable children: Secondary | ICD-10-CM

## 2017-08-24 DIAGNOSIS — I209 Angina pectoris, unspecified: Secondary | ICD-10-CM

## 2017-08-24 DIAGNOSIS — E782 Mixed hyperlipidemia: Secondary | ICD-10-CM

## 2017-08-24 DIAGNOSIS — N952 Postmenopausal atrophic vaginitis: Secondary | ICD-10-CM | POA: Insufficient documentation

## 2017-08-24 DIAGNOSIS — R8281 Pyuria: Secondary | ICD-10-CM

## 2017-08-24 DIAGNOSIS — L97929 Non-pressure chronic ulcer of unspecified part of left lower leg with unspecified severity: Secondary | ICD-10-CM

## 2017-08-24 DIAGNOSIS — N951 Menopausal and female climacteric states: Secondary | ICD-10-CM | POA: Insufficient documentation

## 2017-08-24 DIAGNOSIS — D849 Immunodeficiency, unspecified: Secondary | ICD-10-CM

## 2017-08-24 DIAGNOSIS — I2721 Secondary pulmonary arterial hypertension: Secondary | ICD-10-CM

## 2017-08-24 LAB — UA/M W/RFLX CULTURE, ROUTINE
Bilirubin, UA: NEGATIVE
GLUCOSE, UA: NEGATIVE
NITRITE UA: NEGATIVE
RBC, UA: NEGATIVE
Specific Gravity, UA: 1.02 (ref 1.005–1.030)
Urobilinogen, Ur: 2 mg/dL — ABNORMAL HIGH (ref 0.2–1.0)
pH, UA: 6.5 (ref 5.0–7.5)

## 2017-08-24 LAB — MICROALBUMIN, URINE WAIVED
CREATININE, URINE WAIVED: 300 mg/dL (ref 10–300)
MICROALB, UR WAIVED: 150 mg/L — AB (ref 0–19)

## 2017-08-24 LAB — BAYER DCA HB A1C WAIVED: HB A1C (BAYER DCA - WAIVED): 5.2 % (ref <7.0)

## 2017-08-24 LAB — MICROSCOPIC EXAMINATION

## 2017-08-24 MED ORDER — AMLODIPINE BESYLATE 10 MG PO TABS: 10.0000 mg | ORAL_TABLET | Freq: Every day | ORAL | 1 refills | Status: DC

## 2017-08-24 MED ORDER — GABAPENTIN 300 MG PO CAPS: 600.0000 mg | ORAL_CAPSULE | Freq: Two times a day (BID) | ORAL | 1 refills | Status: DC

## 2017-08-24 MED ORDER — POTASSIUM CHLORIDE CRYS ER 20 MEQ PO TBCR: 20.0000 meq | EXTENDED_RELEASE_TABLET | Freq: Every day | ORAL | 1 refills | Status: DC

## 2017-08-24 MED ORDER — MONTELUKAST SODIUM 10 MG PO TABS: 10.0000 mg | ORAL_TABLET | Freq: Every day | ORAL | 1 refills | Status: DC

## 2017-08-24 MED ORDER — FLUTICASONE PROPIONATE 50 MCG/ACT NA SUSP: 2.0000 | Freq: Every day | NASAL | 12 refills | Status: DC

## 2017-08-24 MED ORDER — APIXABAN 5 MG PO TABS: 5.0000 mg | ORAL_TABLET | Freq: Two times a day (BID) | ORAL | 1 refills | Status: DC

## 2017-08-24 MED ORDER — FUROSEMIDE 40 MG PO TABS: 80.0000 mg | ORAL_TABLET | Freq: Two times a day (BID) | ORAL | 1 refills | Status: DC

## 2017-08-24 MED ORDER — ALBUTEROL SULFATE HFA 108 (90 BASE) MCG/ACT IN AERS
2.0000 | INHALATION_SPRAY | Freq: Four times a day (QID) | RESPIRATORY_TRACT | 0 refills | Status: DC | PRN
Start: 2017-08-24 — End: 2018-11-30

## 2017-08-24 MED ORDER — HYDROXYZINE HCL 25 MG PO TABS: 25.0000 mg | ORAL_TABLET | Freq: Every evening | ORAL | 1 refills | Status: DC | PRN

## 2017-08-24 MED ORDER — VENLAFAXINE HCL ER 75 MG PO CP24: 75.0000 mg | ORAL_CAPSULE | Freq: Every day | ORAL | 1 refills | Status: DC

## 2017-08-24 MED ORDER — VENLAFAXINE HCL ER 150 MG PO CP24: 150.0000 mg | ORAL_CAPSULE | Freq: Every day | ORAL | 1 refills | Status: DC

## 2017-08-24 MED ORDER — PANTOPRAZOLE SODIUM 40 MG PO TBEC: 40.0000 mg | DELAYED_RELEASE_TABLET | Freq: Every day | ORAL | 1 refills | Status: DC

## 2017-08-24 MED ORDER — CETIRIZINE HCL 10 MG PO TABS: 10.0000 mg | ORAL_TABLET | Freq: Every day | ORAL | 11 refills | Status: DC

## 2017-08-24 NOTE — Assessment & Plan Note (Signed)
Continue to follow with rheumatology and hematology. Call with any concerns. Stable at this time.  

## 2017-08-24 NOTE — Assessment & Plan Note (Signed)
Not doing well. Given her lupus and history of PE, not a candidate for hormone replacement. Already on gabapentin and SSRI- will refer to GYN for evaluation.  

## 2017-08-24 NOTE — Assessment & Plan Note (Signed)
Not doing well. Given her lupus and history of PE, not a candidate for hormone replacement. Already on gabapentin and SSRI- will refer to GYN for evaluation.

## 2017-08-24 NOTE — Assessment & Plan Note (Signed)
Resolved. No concerns.  

## 2017-08-24 NOTE — Assessment & Plan Note (Signed)
S/P gastric bypass. Continue to monitor. Call with any concerns.

## 2017-08-24 NOTE — Assessment & Plan Note (Signed)
Continue to follow with rheumatology. Call with any concerns.  

## 2017-08-24 NOTE — Assessment & Plan Note (Signed)
Will keep BP and cholesterol under good control. Continue to monitor. Call with any concerns.  

## 2017-08-24 NOTE — Patient Instructions (Signed)
Health Maintenance for Postmenopausal Women Menopause is a normal process in which your reproductive ability comes to an end. This process happens gradually over a span of months to years, usually between the ages of 22 and 9. Menopause is complete when you have missed 12 consecutive menstrual periods. It is important to talk with your health care provider about some of the most common conditions that affect postmenopausal women, such as heart disease, cancer, and bone loss (osteoporosis). Adopting a healthy lifestyle and getting preventive care can help to promote your health and wellness. Those actions can also lower your chances of developing some of these common conditions. What should I know about menopause? During menopause, you may experience a number of symptoms, such as:  Moderate-to-severe hot flashes.  Night sweats.  Decrease in sex drive.  Mood swings.  Headaches.  Tiredness.  Irritability.  Memory problems.  Insomnia.  Choosing to treat or not to treat menopausal changes is an individual decision that you make with your health care provider. What should I know about hormone replacement therapy and supplements? Hormone therapy products are effective for treating symptoms that are associated with menopause, such as hot flashes and night sweats. Hormone replacement carries certain risks, especially as you become older. If you are thinking about using estrogen or estrogen with progestin treatments, discuss the benefits and risks with your health care provider. What should I know about heart disease and stroke? Heart disease, heart attack, and stroke become more likely as you age. This may be due, in part, to the hormonal changes that your body experiences during menopause. These can affect how your body processes dietary fats, triglycerides, and cholesterol. Heart attack and stroke are both medical emergencies. There are many things that you can do to help prevent heart disease  and stroke:  Have your blood pressure checked at least every 1-2 years. High blood pressure causes heart disease and increases the risk of stroke.  If you are 53-22 years old, ask your health care provider if you should take aspirin to prevent a heart attack or a stroke.  Do not use any tobacco products, including cigarettes, chewing tobacco, or electronic cigarettes. If you need help quitting, ask your health care provider.  It is important to eat a healthy diet and maintain a healthy weight. ? Be sure to include plenty of vegetables, fruits, low-fat dairy products, and lean protein. ? Avoid eating foods that are high in solid fats, added sugars, or salt (sodium).  Get regular exercise. This is one of the most important things that you can do for your health. ? Try to exercise for at least 150 minutes each week. The type of exercise that you do should increase your heart rate and make you sweat. This is known as moderate-intensity exercise. ? Try to do strengthening exercises at least twice each week. Do these in addition to the moderate-intensity exercise.  Know your numbers.Ask your health care provider to check your cholesterol and your blood glucose. Continue to have your blood tested as directed by your health care provider.  What should I know about cancer screening? There are several types of cancer. Take the following steps to reduce your risk and to catch any cancer development as early as possible. Breast Cancer  Practice breast self-awareness. ? This means understanding how your breasts normally appear and feel. ? It also means doing regular breast self-exams. Let your health care provider know about any changes, no matter how small.  If you are 40  or older, have a clinician do a breast exam (clinical breast exam or CBE) every year. Depending on your age, family history, and medical history, it may be recommended that you also have a yearly breast X-ray (mammogram).  If you  have a family history of breast cancer, talk with your health care provider about genetic screening.  If you are at high risk for breast cancer, talk with your health care provider about having an MRI and a mammogram every year.  Breast cancer (BRCA) gene test is recommended for women who have family members with BRCA-related cancers. Results of the assessment will determine the need for genetic counseling and BRCA1 and for BRCA2 testing. BRCA-related cancers include these types: ? Breast. This occurs in males or females. ? Ovarian. ? Tubal. This may also be called fallopian tube cancer. ? Cancer of the abdominal or pelvic lining (peritoneal cancer). ? Prostate. ? Pancreatic.  Cervical, Uterine, and Ovarian Cancer Your health care provider may recommend that you be screened regularly for cancer of the pelvic organs. These include your ovaries, uterus, and vagina. This screening involves a pelvic exam, which includes checking for microscopic changes to the surface of your cervix (Pap test).  For women ages 21-65, health care providers may recommend a pelvic exam and a Pap test every three years. For women ages 79-65, they may recommend the Pap test and pelvic exam, combined with testing for human papilloma virus (HPV), every five years. Some types of HPV increase your risk of cervical cancer. Testing for HPV may also be done on women of any age who have unclear Pap test results.  Other health care providers may not recommend any screening for nonpregnant women who are considered low risk for pelvic cancer and have no symptoms. Ask your health care provider if a screening pelvic exam is right for you.  If you have had past treatment for cervical cancer or a condition that could lead to cancer, you need Pap tests and screening for cancer for at least 20 years after your treatment. If Pap tests have been discontinued for you, your risk factors (such as having a new sexual partner) need to be  reassessed to determine if you should start having screenings again. Some women have medical problems that increase the chance of getting cervical cancer. In these cases, your health care provider may recommend that you have screening and Pap tests more often.  If you have a family history of uterine cancer or ovarian cancer, talk with your health care provider about genetic screening.  If you have vaginal bleeding after reaching menopause, tell your health care provider.  There are currently no reliable tests available to screen for ovarian cancer.  Lung Cancer Lung cancer screening is recommended for adults 69-62 years old who are at high risk for lung cancer because of a history of smoking. A yearly low-dose CT scan of the lungs is recommended if you:  Currently smoke.  Have a history of at least 30 pack-years of smoking and you currently smoke or have quit within the past 15 years. A pack-year is smoking an average of one pack of cigarettes per day for one year.  Yearly screening should:  Continue until it has been 15 years since you quit.  Stop if you develop a health problem that would prevent you from having lung cancer treatment.  Colorectal Cancer  This type of cancer can be detected and can often be prevented.  Routine colorectal cancer screening usually begins at  age 42 and continues through age 45.  If you have risk factors for colon cancer, your health care provider may recommend that you be screened at an earlier age.  If you have a family history of colorectal cancer, talk with your health care provider about genetic screening.  Your health care provider may also recommend using home test kits to check for hidden blood in your stool.  A small camera at the end of a tube can be used to examine your colon directly (sigmoidoscopy or colonoscopy). This is done to check for the earliest forms of colorectal cancer.  Direct examination of the colon should be repeated every  5-10 years until age 71. However, if early forms of precancerous polyps or small growths are found or if you have a family history or genetic risk for colorectal cancer, you may need to be screened more often.  Skin Cancer  Check your skin from head to toe regularly.  Monitor any moles. Be sure to tell your health care provider: ? About any new moles or changes in moles, especially if there is a change in a mole's shape or color. ? If you have a mole that is larger than the size of a pencil eraser.  If any of your family members has a history of skin cancer, especially at a young age, talk with your health care provider about genetic screening.  Always use sunscreen. Apply sunscreen liberally and repeatedly throughout the day.  Whenever you are outside, protect yourself by wearing long sleeves, pants, a wide-brimmed hat, and sunglasses.  What should I know about osteoporosis? Osteoporosis is a condition in which bone destruction happens more quickly than new bone creation. After menopause, you may be at an increased risk for osteoporosis. To help prevent osteoporosis or the bone fractures that can happen because of osteoporosis, the following is recommended:  If you are 46-71 years old, get at least 1,000 mg of calcium and at least 600 mg of vitamin D per day.  If you are older than age 55 but younger than age 65, get at least 1,200 mg of calcium and at least 600 mg of vitamin D per day.  If you are older than age 54, get at least 1,200 mg of calcium and at least 800 mg of vitamin D per day.  Smoking and excessive alcohol intake increase the risk of osteoporosis. Eat foods that are rich in calcium and vitamin D, and do weight-bearing exercises several times each week as directed by your health care provider. What should I know about how menopause affects my mental health? Depression may occur at any age, but it is more common as you become older. Common symptoms of depression  include:  Low or sad mood.  Changes in sleep patterns.  Changes in appetite or eating patterns.  Feeling an overall lack of motivation or enjoyment of activities that you previously enjoyed.  Frequent crying spells.  Talk with your health care provider if you think that you are experiencing depression. What should I know about immunizations? It is important that you get and maintain your immunizations. These include:  Tetanus, diphtheria, and pertussis (Tdap) booster vaccine.  Influenza every year before the flu season begins.  Pneumonia vaccine.  Shingles vaccine.  Your health care provider may also recommend other immunizations. This information is not intended to replace advice given to you by your health care provider. Make sure you discuss any questions you have with your health care provider. Document Released: 04/16/2005  Document Revised: 09/12/2015 Document Reviewed: 11/26/2014 [employer] Interactive Patient Education  2018 [employer]  

## 2017-08-24 NOTE — Assessment & Plan Note (Signed)
Continue to follow with rheumatology and hematology. Call with any concerns. Stable at this time.

## 2017-08-24 NOTE — Assessment & Plan Note (Signed)
Under good control. Continue current regimen. Continue to monitor. Call with any concerns. Refills given.  

## 2017-08-24 NOTE — Assessment & Plan Note (Signed)
Stable. Rechecking labs today. Adjust dose as needed. Call with any concerns.

## 2017-08-24 NOTE — Progress Notes (Signed)
BP 118/81 (BP Location: Left Arm, Patient Position: Sitting, Cuff Size: Large)   Pulse 61   Temp 98.5 F (36.9 C)   Ht 5\' 3"  (1.6 m)   Wt 234 lb (106.1 kg)   SpO2 98%   BMI 41.45 kg/m    Subjective:    Patient ID: Kaylee Fox, female    DOB: [birth year], 57 y.o.   MRN: [id number]  HPI: Kaylee Fox is a 57 y.o. female presenting on 08/24/2017 for comprehensive medical examination. Current medical complaints include:  LUMP Duration: couple of weeks Location: R side Onset: gradual Painful: no Discomfort: no Status:  not changing Trauma: no Redness: no Bruising: no Recent infection: no Swollen lymph nodes: no Requesting removal: no  HYPERTENSION / HYPERLIPIDEMIA Satisfied with current treatment? yes Duration of hypertension: chronic BP monitoring frequency: not checking BP medication side effects: no Past BP meds: amlodipine, lasix Duration of hyperlipidemia: chronic Cholesterol medication side effects: no Cholesterol supplements: none Past cholesterol medications: none Medication compliance: excellent compliance Aspirin: no Recent stressors: no Recurrent headaches: no Visual changes: no Palpitations: no Dyspnea: no Chest pain: no Lower extremity edema: no Dizzy/lightheaded: no  DEPRESSION Mood status: stable Satisfied with current treatment?: yes Symptom severity: moderate  Duration of current treatment : chronic Side effects: no Medication compliance: excellent compliance Psychotherapy/counseling: no  Previous psychiatric medications: effexor Depressed mood: yes Anxious mood: yes Anhedonia: no Significant weight loss or gain: yes Insomnia: no  Fatigue: yes Feelings of worthlessness or guilt: no Impaired concentration/indecisiveness: no Suicidal ideations: no Hopelessness: no Crying spells: no Depression screen [facility] 2/9 08/24/2017 08/22/2017 05/09/2017 04/08/2017 08/20/2016  Decreased Interest 0 3 0 2 0  Down, Depressed, Hopeless 3 2 0 2 0  PHQ - 2  Score 3 5 0 4 0  Altered sleeping 1 0 2 2 2   Tired, decreased energy 1 1 1 2 2   Change in appetite 0 0 0 1 0  Feeling bad or failure about yourself  3 0 0 1 0  Trouble concentrating 0 0 0 0 2  Moving slowly or fidgety/restless 0 0 0 0 0  Suicidal thoughts 0 0 0 0 0  PHQ-9 Score 8 6 3 10 6   Difficult doing work/chores Not difficult at all Not difficult at all Not difficult at all - -   HYPOTHYROIDISM Thyroid control status:stable Satisfied with current treatment? yes Medication side effects: no Medication compliance: excellent compliance Recent dose adjustment:no Fatigue: yes Cold intolerance: no Heat intolerance: no Weight gain: no Weight loss: no Constipation: no Diarrhea/loose stools: no Palpitations: no Lower extremity edema: no Anxiety/depressed mood: yes  Menopausal Symptoms: no  Depression Screen done today and results listed below:  Depression screen [facility] 2/9 08/24/2017 08/22/2017 05/09/2017 04/08/2017 08/20/2016  Decreased Interest 0 3 0 2 0  Down, Depressed, Hopeless 3 2 0 2 0  PHQ - 2 Score 3 5 0 4 0  Altered sleeping 1 0 2 2 2   Tired, decreased energy 1 1 1 2 2   Change in appetite 0 0 0 1 0  Feeling bad or failure about yourself  3 0 0 1 0  Trouble concentrating 0 0 0 0 2  Moving slowly or fidgety/restless 0 0 0 0 0  Suicidal thoughts 0 0 0 0 0  PHQ-9 Score 8 6 3 10 6   Difficult doing work/chores Not difficult at all Not difficult at all Not difficult at all - -    Past Medical History:  Past Medical History:  Diagnosis Date  .  Anticoagulant long-term use   . Anxiety   . Autoimmune hypothyroidism   . Chronic hypokalemia   . Chronic pain   . Chronic, continuous use of opioids   . Depression   . Diverticulosis   . Fatigue   . Fibromyalgia   . Herpes labialis   . Herpes simplex   . History of pulmonary embolus (PE)   . History of pulmonary hypertension   . Hypertension   . Lupus (systemic lupus erythematosus) (HCC)   . Neuromuscular disorder (HCC)   .  OSA on CPAP   . Osteoporosis   . Venous ulcers of both lower extremities (HCC) 06/17/2016  . Vitamin D deficiency     Surgical History:  Past Surgical History:  Procedure Laterality Date  . CESAREAN SECTION    . CHOLECYSTECTOMY    . COLONOSCOPY WITH PROPOFOL N/A 07/16/2016   Procedure: COLONOSCOPY WITH PROPOFOL;  Surgeon: Wyline Mood, MD;  Location: [facility] ENDOSCOPY;  Service: Endoscopy;  Laterality: N/A;    Medications:  Current Outpatient Medications on File Prior to Visit  Medication Sig  . acetaminophen (TYLENOL) 325 MG tablet Take by mouth.  . ALPRAZolam (XANAX) 0.5 MG tablet at bedtime as needed.   Marland Kitchen azaTHIOprine (IMURAN) 50 MG tablet Take 100 mg by mouth daily.  . baclofen (LIORESAL) 10 MG tablet TAKE 1 TABLET DAILY AS NEEDED FOR MUSCLE SPASMS.  . calcium carbonate (CALCIUM 600) 600 MG TABS tablet Take by mouth.  . Cholecalciferol (D3-1000) 1000 units capsule Take 1,000 Units by mouth daily. Takes 2 1000 mg per day  . HYDROcodone-acetaminophen (NORCO) 10-325 MG tablet Take by mouth.  . hydroxychloroquine (PLAQUENIL) 200 MG tablet Take 200 mg by mouth. Take 3 200 mg tabs a day  . levothyroxine (SYNTHROID, LEVOTHROID) 75 MCG tablet TAKE 1 TABLET BY MOUTH DAILY BEFORE BREAKFAST  . Multiple Vitamin (MULTI-VITAMINS) TABS Take by mouth.  . mupirocin cream (BACTROBAN) 2 % Apply to affected area on arms/legs twice a day as needed.  . VOLTAREN 1 % GEL    No current facility-administered medications on file prior to visit.     Allergies:  Allergies  Allergen Reactions  . Penicillins Rash  . Clindamycin Nausea Only  . Ciprofloxacin Other (See Comments)  . Morphine Other (See Comments)    Social History:  Social History   Socioeconomic History  . Marital status: Legally Separated    Spouse name: Not on file  . Number of children: Not on file  . Years of education: Not on file  . Highest education level: Not on file  Occupational History  . Not on file  Social Needs  .  Financial resource strain: Not hard at all  . Food insecurity:    Worry: Never true    Inability: Never true  . Transportation needs:    Medical: No    Non-medical: No  Tobacco Use  . Smoking status: Never Smoker  . Smokeless tobacco: Never Used  Substance and Sexual Activity  . Alcohol use: No  . Drug use: No  . Sexual activity: Yes  Lifestyle  . Physical activity:    Days per week: 0 days    Minutes per session: 0 min  . Stress: Not at all  Relationships  . Social connections:    Talks on phone: More than three times a week    Gets together: More than three times a week    Attends religious service: More than 4 times per year    Active member of  club or organization: No    Attends meetings of clubs or organizations: Never    Relationship status: Separated  . Intimate partner violence:    Fear of current or ex partner: No    Emotionally abused: No    Physically abused: No    Forced sexual activity: No  Other Topics Concern  . Not on file  Social History Narrative  . Not on file   Social History   Tobacco Use  Smoking Status Never Smoker  Smokeless Tobacco Never Used   Social History   Substance and Sexual Activity  Alcohol Use No    Family History:  Family History  Problem Relation Age of Onset  . Arthritis Mother   . Osteoarthritis Mother   . Lupus Mother   . Arthritis Father   . Osteoarthritis Father   . Lupus Father     Past medical history, surgical history, medications, allergies, family history and social history reviewed with patient today and changes made to appropriate areas of the chart.   Review of Systems  Constitutional: Positive for diaphoresis and weight loss. Negative for chills, fever and malaise/fatigue.  HENT: Positive for ear pain and hearing loss. Negative for congestion, ear discharge, nosebleeds, sinus pain, sore throat and tinnitus.   Eyes: Negative.   Respiratory: Negative.  Negative for stridor.   Cardiovascular: Positive  for palpitations. Negative for chest pain, orthopnea, claudication, leg swelling and PND.  Gastrointestinal: Positive for heartburn and nausea. Negative for abdominal pain, blood in stool, constipation, diarrhea, melena and vomiting.  Genitourinary: Negative.        Vaginal driness, burning in the vaginal area  Musculoskeletal: Positive for myalgias (went swimming yesterday and has been sore today). Negative for back pain, falls, joint pain and neck pain.  Skin: Negative.   Neurological: Positive for dizziness and headaches. Negative for tingling, tremors, sensory change, speech change, focal weakness, seizures, loss of consciousness and weakness.  Endo/Heme/Allergies: Positive for environmental allergies and polydipsia. Does not bruise/bleed easily.  Psychiatric/Behavioral: Negative.     All other ROS negative except what is listed abov+e and in the HPI.      Objective:    BP 118/81 (BP Location: Left Arm, Patient Position: Sitting, Cuff Size: Large)   Pulse 61   Temp 98.5 F (36.9 C)   Ht 5\' 3"  (1.6 m)   Wt 234 lb (106.1 kg)   SpO2 98%   BMI 41.45 kg/m   Wt Readings from Last 3 Encounters:  08/24/17 234 lb (106.1 kg)  08/22/17 236 lb 14.4 oz (107.5 kg)  07/19/17 237 lb (107.5 kg)   Orthostatic VS for the past 24 hrs:  BP- Lying Pulse- Lying BP- Sitting Pulse- Sitting BP- Standing at 0 minutes Pulse- Standing at 0 minutes  08/24/17 1053 115/76 57 118/81 60 107/77 69    Physical Exam  Constitutional: She is oriented to person, place, and time. She appears well-developed and well-nourished.  Non-toxic appearance. She does not appear ill. No distress.  HENT:  Head: Normocephalic and atraumatic.  Right Ear: Hearing normal.  Left Ear: Hearing normal.  Nose: Nose normal.  Mouth/Throat: Oropharynx is clear and moist.  Eyes: Pupils are equal, round, and reactive to light. Conjunctivae, EOM and lids are normal. Right eye exhibits no discharge. Left eye exhibits no discharge. No  scleral icterus. Right eye exhibits normal extraocular motion and no nystagmus. Left eye exhibits normal extraocular motion and no nystagmus. Right pupil is round and reactive. Left pupil is round and  reactive. Pupils are equal.  Neck: Normal range of motion. Neck supple. No JVD present. No neck rigidity. No tracheal deviation present. No Brudzinski's sign and no Kernig's sign noted.  Cardiovascular: Normal rate, regular rhythm, normal heart sounds and intact distal pulses. Exam reveals no gallop and no friction rub.  No murmur heard. Pulmonary/Chest: Effort normal and breath sounds normal. No stridor. No respiratory distress. She has no wheezes. She has no rales. She exhibits no tenderness.  Abdominal: Soft. Bowel sounds are normal. She exhibits no distension and no mass. There is no tenderness. There is no guarding.  Musculoskeletal: Normal range of motion. She exhibits no edema or tenderness.  Lymphadenopathy:    She has no cervical adenopathy.  Neurological: She is alert and oriented to person, place, and time. She has normal strength. She is not disoriented. She displays normal reflexes. No cranial nerve deficit or sensory deficit. She displays a negative Romberg sign. Coordination and gait normal. She displays no Babinski's sign on the right side. She displays no Babinski's sign on the left side.  Skin: Skin is warm, dry and intact. Capillary refill takes less than 2 seconds. No rash noted. She is not diaphoretic. No cyanosis or erythema. No pallor.  Psychiatric: She has a normal mood and affect. Her speech is normal and behavior is normal. Judgment and thought content normal. Her mood appears not anxious. She is not agitated. Cognition and memory are normal.  Nursing note and vitals reviewed.   Results for orders placed or performed in visit on 04/08/17  Veritor Flu A/B Waived  Result Value Ref Range   Influenza A Negative Negative   Influenza B Negative Negative      Assessment & Plan:     Problem List Items Addressed This Visit      Cardiovascular and Mediastinum   Angina pectoris (HCC)    Will keep BP and cholesterol under good control. Continue to monitor. Call with any concerns.       Relevant Medications   furosemide (LASIX) 40 MG tablet   apixaban (ELIQUIS) 5 MG TABS tablet   amLODipine (NORVASC) 10 MG tablet   Pulmonary hypertensive arterial disease (HCC)    Will keep BP and cholesterol under good control. Continue to monitor. Call with any concerns.       Relevant Medications   furosemide (LASIX) 40 MG tablet   apixaban (ELIQUIS) 5 MG TABS tablet   amLODipine (NORVASC) 10 MG tablet     Endocrine   Autoimmune hypothyroidism    Stable. Rechecking labs today. Adjust dose as needed. Call with any concerns.       Relevant Orders   CBC with Differential/Platelet   Comprehensive metabolic panel   TSH   UA/M w/rflx Culture, Routine     Nervous and Auditory   Neuromuscular disorder (HCC)    Continue to follow with rheumatology. Call with any concerns.       Relevant Medications   venlafaxine XR (EFFEXOR-XR) 150 MG 24 hr capsule   venlafaxine XR (EFFEXOR XR) 75 MG 24 hr capsule   gabapentin (NEURONTIN) 300 MG capsule   hydrOXYzine (ATARAX/VISTARIL) 25 MG tablet     Musculoskeletal and Integument   RESOLVED: Venous ulcers of both lower extremities (HCC)    Resolved. No concerns.         Genitourinary   Benign hypertensive renal disease    Under good control. Continue current regimen. Continue to monitor. Call with any concerns. Refills given.  Relevant Orders   CBC with Differential/Platelet   Comprehensive metabolic panel   Microalbumin, Urine Waived   UA/M w/rflx Culture, Routine   Vaginal atrophy    Not doing well. Given her lupus and history of PE, not a candidate for hormone replacement. Already on gabapentin and SSRI- will refer to GYN for evaluation.       Relevant Orders   Ambulatory referral to Obstetrics / Gynecology      Other   Depression    Not under good control. Will check thyroid. Continue current regimen. Continue to monitor. If thyroid normal, will add wellbutrin and recheck 1 month. Call with any concerns.       Relevant Medications   venlafaxine XR (EFFEXOR-XR) 150 MG 24 hr capsule   venlafaxine XR (EFFEXOR XR) 75 MG 24 hr capsule   hydrOXYzine (ATARAX/VISTARIL) 25 MG tablet   Vitamin D deficiency    Rechecking levels today. Will adjust as needed. Call with any concerns.       Relevant Orders   CBC with Differential/Platelet   Comprehensive metabolic panel   UA/M w/rflx Culture, Routine   VITAMIN D 25 Hydroxy (Vit-D Deficiency, Fractures)   Lupus (systemic lupus erythematosus) (HCC)    Continue to follow with rheumatology and hematology. Call with any concerns. Stable at this time.       Relevant Orders   CBC with Differential/Platelet   Comprehensive metabolic panel   UA/M w/rflx Culture, Routine   Continuous opioid dependence (HCC)    Continue to follow with pain management. Call with any concerns.       Immunosuppression (HCC)    Continue to follow with rheumatology and hematology. Call with any concerns. Stable at this time.       Morbid obesity with BMI of 50.0-59.9, adult (HCC)    S/P gastric bypass. Continue to monitor. Call with any concerns.       On corticosteroid therapy   Relevant Orders   CBC with Differential/Platelet   Comprehensive metabolic panel   UA/M w/rflx Culture, Routine   Bayer DCA Hb A1c Waived   Mixed hyperlipidemia    Under good control. Continue current regimen. Continue to monitor. Call with any concerns. Refills given.       Relevant Medications   furosemide (LASIX) 40 MG tablet   apixaban (ELIQUIS) 5 MG TABS tablet   amLODipine (NORVASC) 10 MG tablet   Other Relevant Orders   CBC with Differential/Platelet   Comprehensive metabolic panel   Lipid Panel w/o Chol/HDL Ratio   UA/M w/rflx Culture, Routine   Menopausal symptoms    Not doing  well. Given her lupus and history of PE, not a candidate for hormone replacement. Already on gabapentin and SSRI- will refer to GYN for evaluation.       Relevant Orders   Ambulatory referral to Obstetrics / Gynecology    Other Visit Diagnoses    Routine general medical examination at a health care facility    -  Primary   Vaccines up to date. Screening labs checked today. Pap up to date. Colon up to date. Mammogram ordered today. Call with any concerns.    Encounter for screening for HIV       Labs drawn today. Await results.    Encounter for hepatitis C screening test for low risk patient       Labs drawn today. Await results.    Lump       Fat tissue. Reassured patient. Call with any concerns.  Pyuria       Culture ordered. Await results.    Relevant Orders   Urine Culture       Follow up plan: Return in about 1 month (around 09/21/2017) for Follow up mood and headaches.   LABORATORY TESTING:  - Pap smear: up to date  IMMUNIZATIONS:   - Tdap: Tetanus vaccination status reviewed: last tetanus booster within 10 years. - Influenza: Up to date - Pneumovax: Up to date - Prevnar: Up to date - HPV: Not applicable - Zostavax vaccine: Given elsewhere  SCREENING: -Mammogram: Ordered today  - Colonoscopy: Up to date   PATIENT COUNSELING:   Advised to take 1 mg of folate supplement per day if capable of pregnancy.   Sexuality: Discussed sexually transmitted diseases, partner selection, use of condoms, avoidance of unintended pregnancy  and contraceptive alternatives.   Advised to avoid cigarette smoking.  I discussed with the patient that most people either abstain from alcohol or drink within safe limits (<=14/week and <=4 drinks/occasion for males, <=7/weeks and <= 3 drinks/occasion for females) and that the risk for alcohol disorders and other health effects rises proportionally with the number of drinks per week and how often a drinker exceeds daily limits.  Discussed  cessation/primary prevention of drug use and availability of treatment for abuse.   Diet: Encouraged to adjust caloric intake to maintain  or achieve ideal body weight, to reduce intake of dietary saturated fat and total fat, to limit sodium intake by avoiding high sodium foods and not adding table salt, and to maintain adequate dietary potassium and calcium preferably from fresh fruits, vegetables, and low-fat dairy products.    stressed the importance of regular exercise  Injury prevention: Discussed safety belts, safety helmets, smoke detector, smoking near bedding or upholstery.   Dental health: Discussed importance of regular tooth brushing, flossing, and dental visits.    NEXT PREVENTATIVE PHYSICAL DUE IN 1 YEAR. Return in about 1 month (around 09/21/2017) for Follow up mood and headaches.

## 2017-08-24 NOTE — Assessment & Plan Note (Signed)
Continue to follow with pain management. Call with any concerns.  

## 2017-08-24 NOTE — Assessment & Plan Note (Addendum)
Not under good control. Will check thyroid. Continue current regimen. Continue to monitor. If thyroid normal, will add wellbutrin and recheck 1 month. Call with any concerns.

## 2017-08-24 NOTE — Assessment & Plan Note (Signed)
Rechecking levels today. Will adjust as needed. Call with any concerns.  

## 2017-08-25 LAB — CBC WITH DIFFERENTIAL/PLATELET
Basophils Absolute: 0.1 10*3/uL (ref 0.0–0.2)
Basos: 1 %
EOS (ABSOLUTE): 0.1 10*3/uL (ref 0.0–0.4)
EOS: 2 %
Hematocrit: 38.4 % (ref 34.0–46.6)
Hemoglobin: 12.7 g/dL (ref 11.1–15.9)
IMMATURE GRANS (ABS): 0 10*3/uL (ref 0.0–0.1)
IMMATURE GRANULOCYTES: 0 %
LYMPHS: 38 %
Lymphocytes Absolute: 1.6 10*3/uL (ref 0.7–3.1)
MCH: 33 pg (ref 26.6–33.0)
MCHC: 33.1 g/dL (ref 31.5–35.7)
MCV: 100 fL — ABNORMAL HIGH (ref 79–97)
MONOS ABS: 0.3 10*3/uL (ref 0.1–0.9)
Monocytes: 6 %
NEUTROS PCT: 53 %
Neutrophils Absolute: 2.3 10*3/uL (ref 1.4–7.0)
PLATELETS: 315 10*3/uL (ref 150–450)
RBC: 3.85 x10E6/uL (ref 3.77–5.28)
RDW: 14.7 % (ref 12.3–15.4)
WBC: 4.4 10*3/uL (ref 3.4–10.8)

## 2017-08-25 LAB — HIV ANTIBODY (ROUTINE TESTING W REFLEX): HIV Screen 4th Generation wRfx: NONREACTIVE

## 2017-08-25 LAB — VITAMIN D 25 HYDROXY (VIT D DEFICIENCY, FRACTURES): Vit D, 25-Hydroxy: 33.6 ng/mL (ref 30.0–100.0)

## 2017-08-25 LAB — COMPREHENSIVE METABOLIC PANEL
ALT: 14 IU/L (ref 0–32)
AST: 23 IU/L (ref 0–40)
Albumin/Globulin Ratio: 1.6 (ref 1.2–2.2)
Albumin: 3.8 g/dL (ref 3.5–5.5)
Alkaline Phosphatase: 72 IU/L (ref 39–117)
BUN/Creatinine Ratio: 21 (ref 9–23)
BUN: 13 mg/dL (ref 6–24)
Bilirubin Total: 0.5 mg/dL (ref 0.0–1.2)
CALCIUM: 9.2 mg/dL (ref 8.7–10.2)
CO2: 25 mmol/L (ref 20–29)
Chloride: 105 mmol/L (ref 96–106)
Creatinine, Ser: 0.61 mg/dL (ref 0.57–1.00)
GFR calc Af Amer: 118 mL/min/{1.73_m2} (ref >59)
GFR, EST NON AFRICAN AMERICAN: 102 mL/min/{1.73_m2} (ref >59)
Globulin, Total: 2.4 g/dL (ref 1.5–4.5)
Glucose: 93 mg/dL (ref 65–99)
Potassium: 3.9 mmol/L (ref 3.5–5.2)
Sodium: 143 mmol/L (ref 134–144)
Total Protein: 6.2 g/dL (ref 6.0–8.5)

## 2017-08-25 LAB — LIPID PANEL W/O CHOL/HDL RATIO
Cholesterol, Total: 210 mg/dL — ABNORMAL HIGH (ref 100–199)
HDL: 44 mg/dL (ref >39)
LDL CALC: 151 mg/dL — AB (ref 0–99)
Triglycerides: 76 mg/dL (ref 0–149)
VLDL Cholesterol Cal: 15 mg/dL (ref 5–40)

## 2017-08-25 LAB — HEPATITIS C ANTIBODY: Hep C Virus Ab: <0.1 s/co ratio (ref 0.0–0.9)

## 2017-08-25 LAB — TSH: TSH: 2.08 u[IU]/mL (ref 0.450–4.500)

## 2017-08-26 ENCOUNTER — Encounter: Payer: Self-pay | Admitting: Family Medicine

## 2017-08-26 ENCOUNTER — Other Ambulatory Visit: Payer: Self-pay | Admitting: Family Medicine

## 2017-08-26 LAB — URINE CULTURE

## 2017-08-26 MED ORDER — LEVOTHYROXINE SODIUM 75 MCG PO TABS: ORAL_TABLET | ORAL | 3 refills | Status: DC

## 2017-09-02 ENCOUNTER — Ambulatory Visit (INDEPENDENT_AMBULATORY_CARE_PROVIDER_SITE_OTHER): Payer: Medicare HMO | Admitting: Vascular Surgery

## 2017-09-12 DIAGNOSIS — Z5181 Encounter for therapeutic drug level monitoring: Secondary | ICD-10-CM | POA: Diagnosis not present

## 2017-09-12 DIAGNOSIS — Z79899 Other long term (current) drug therapy: Secondary | ICD-10-CM | POA: Diagnosis not present

## 2017-09-13 ENCOUNTER — Encounter: Payer: Self-pay | Admitting: Obstetrics and Gynecology

## 2017-09-13 ENCOUNTER — Ambulatory Visit (INDEPENDENT_AMBULATORY_CARE_PROVIDER_SITE_OTHER): Payer: Medicare HMO | Admitting: Obstetrics and Gynecology

## 2017-09-13 VITALS — HR 78 | Ht 62.0 in | Wt 238.0 lb

## 2017-09-13 DIAGNOSIS — R102 Pelvic and perineal pain: Secondary | ICD-10-CM

## 2017-09-13 DIAGNOSIS — N941 Unspecified dyspareunia: Secondary | ICD-10-CM

## 2017-09-13 DIAGNOSIS — N952 Postmenopausal atrophic vaginitis: Secondary | ICD-10-CM | POA: Diagnosis not present

## 2017-09-13 NOTE — Progress Notes (Signed)
Obstetrics & Gynecology Office Visit   Chief Complaint  Patient presents with  . Referral    [facility] for vaginal atrophy   History of Present Illness: 57 y.o. [id number] menopausal female seen in referral [facility] Ole[facility]erchesMegan Johnson, DO[facility].  Her last menstrual period was 2 years ago.  She has had no bleeding since that time. She has a medical history significant for SLE and VTE.  She notes dyspareunia. This has been going on for about 3 months.  This occurs at all levels of penetration.  She has tried nothing to help with the discomfort.  She also notes a burning in her lower abdomen that comes and goes.  She denies dysuria, blood in urine, urinary frequency.  Nothing makes it worse.  Nothing makes it feel better. The pain usually lasts a couple of minutes.  It occurs several times a day for the past six months. She also notes pain in her hips and lower back. She also notes pain down her legs when having this burning.  Last pap smear was 08/2016 - normal.  She had an abnormal pap smear several years ago.   She had a negative urine culture on 08/24/17.  Her last mammogram was greater than 2 years ago.  It was normal.  She had a colonoscopy last year, it was normal.   She has had a gastric sleeve procedure late last year.  She has lost about 100 pounds.   Past Medical History:  Diagnosis Date  . Anticoagulant long-term use   . Anxiety   . Autoimmune hypothyroidism   . Chronic hypokalemia   . Chronic pain   . Chronic, continuous use of opioids   . Depression   . Diverticulosis   . Fatigue   . Fibromyalgia   . Herpes labialis   . Herpes simplex   . History of pulmonary embolus (PE)   . History of pulmonary hypertension   . Hypertension   . Lupus (systemic lupus erythematosus) (HCC)   . Neuromuscular disorder (HCC)   . OSA on CPAP   . Osteoporosis   . Venous ulcers of both lower extremities (HCC) 06/17/2016  . Vitamin D deficiency      Past Surgical History:  Procedure Laterality Date  . CESAREAN SECTION    . CHOLECYSTECTOMY    . COLONOSCOPY WITH PROPOFOL N/A 07/16/2016   Procedure: COLONOSCOPY WITH PROPOFOL;  Surgeon: Wyline MoodAnna, Kiran, MD;  [facility];  Service: Endoscopy;  Laterality: N/A;  . LAPAROSCOPIC GASTRIC SLEEVE RESECTION      Gynecologic History: No LMP recorded. Patient is postmenopausal.  Obstetric History: G2P1101 G1 @ 29 weeks, c-section G2 @ term, successful VBAC  Family History  Problem Relation Age of Onset  . Arthritis Mother   . Osteoarthritis Mother   . Lupus Mother   . Hypothyroidism Mother   . Osteoarthritis Father   . Emphysema Father   . Coronary artery disease Father     Social History   Socioeconomic History  . Marital status: Legally Separated    Spouse name: Not on file  . Number of children: Not on file  . Years of education: Not on file  . Highest education level: Not on file  Occupational History  . Not on file  Social Needs  . Financial resource strain: Not hard at all  . Food insecurity:    Worry: Never true    Inability: Never true  . Transportation needs:  Medical: No    Non-medical: No  Tobacco Use  . Smoking status: Never Smoker  . Smokeless tobacco: Never Used  Substance and Sexual Activity  . Alcohol use: No  . Drug use: No  . Sexual activity: Yes    Birth control/protection: Post-menopausal  Lifestyle  . Physical activity:    Days per week: 0 days    Minutes per session: 0 min  . Stress: Not at all  Relationships  . Social connections:    Talks on phone: More than three times a week    Gets together: More than three times a week    Attends religious service: More than 4 times per year    Active member of club or organization: No    Attends meetings of clubs or organizations: Never    Relationship status: Separated  . Intimate partner violence:    Fear of current or ex partner: No    Emotionally abused: No    Physically  abused: No    Forced sexual activity: No  Other Topics Concern  . Not on file  Social History Narrative  . Not on file    Allergies  Allergen Reactions  . Penicillins Rash  . Clindamycin Nausea Only  . Ciprofloxacin Other (See Comments)  . Morphine Other (See Comments)    Prior to Admission medications   Medication Sig Start Date End Date Taking? Authorizing Provider  acetaminophen (TYLENOL) 325 MG tablet Take by mouth. 01/27/15  Yes [provider]  albuterol (PROVENTIL HFA;VENTOLIN HFA) 108 (90 Base) MCG/ACT inhaler Inhale 2 puffs into the lungs every 6 (six) hours as needed for wheezing or shortness of breath. 08/24/17  Yes Johnson, Megan P, DO  ALPRAZolam (XANAX) 0.5 MG tablet at bedtime as needed.  05/02/17  Yes [provider]  amLODipine (NORVASC) 10 MG tablet Take 1 tablet (10 mg total) by mouth daily. 08/24/17  Yes Johnson, Megan P, DO  apixaban (ELIQUIS) 5 MG TABS tablet Take 1 tablet (5 mg total) by mouth 2 (two) times daily. 08/24/17  Yes Johnson, Megan P, DO  azaTHIOprine (IMURAN) 50 MG tablet Take 100 mg by mouth daily.   Yes [provider]  baclofen (LIORESAL) 10 MG tablet TAKE 1 TABLET DAILY AS NEEDED FOR MUSCLE SPASMS. 06/06/17  Yes Johnson, Megan P, DO  calcium carbonate (CALCIUM 600) 600 MG TABS tablet Take by mouth. 02/26/14  Yes [provider]  cetirizine (ZYRTEC) 10 MG tablet Take 1 tablet (10 mg total) by mouth daily. 08/24/17  Yes Johnson, Megan P, DO  Cholecalciferol (D3-1000) 1000 units capsule Take 1,000 Units by mouth daily. Takes 2 1000 mg per day   Yes [provider]  fluticasone (FLONASE) 50 MCG/ACT nasal spray Place 2 sprays into both nostrils daily. 08/24/17  Yes Johnson, Megan P, DO  furosemide (LASIX) 40 MG tablet Take 2 tablets (80 mg total) by mouth 2 (two) times daily. 08/24/17  Yes Johnson, Megan P, DO  gabapentin (NEURONTIN) 300 MG capsule Take 2 capsules (600 mg total) by mouth 2 (two) times daily. 08/24/17   Yes Johnson, Megan P, DO  HYDROcodone-acetaminophen (NORCO) 10-325 MG tablet Take by mouth. 09/26/09  Yes [provider]  hydroxychloroquine (PLAQUENIL) 200 MG tablet Take 200 mg by mouth. Take 3 200 mg tabs a day   Yes [provider]  hydrOXYzine (ATARAX/VISTARIL) 25 MG tablet Take 1 tablet (25 mg total) by mouth at bedtime as needed. 08/24/17  Yes Johnson, Megan P, DO  levothyroxine (SYNTHROID,  LEVOTHROID) 75 MCG tablet TAKE 1 TABLET BY MOUTH DAILY BEFORE BREAKFAST 08/26/17  Yes Johnson, Megan P, DO  montelukast (SINGULAIR) 10 MG tablet Take 1 tablet (10 mg total) by mouth at bedtime. 08/24/17  Yes Johnson, Megan P, DO  Multiple Vitamin (MULTI-VITAMINS) TABS Take by mouth.   Yes [provider]  mupirocin cream (BACTROBAN) 2 % Apply to affected area on arms/legs twice a day as needed. 03/15/12  Yes [provider]  pantoprazole (PROTONIX) 40 MG tablet Take 1 tablet (40 mg total) by mouth daily. 08/24/17  Yes Johnson, Megan P, DO  potassium chloride SA (KLOR-CON M20) 20 MEQ tablet Take 1 tablet (20 mEq total) by mouth daily. 08/24/17  Yes Johnson, Megan P, DO  venlafaxine XR (EFFEXOR XR) 75 MG 24 hr capsule Take 1 capsule (75 mg total) by mouth daily with breakfast. 08/24/17  Yes Laural Benes, Megan P, DO  venlafaxine XR (EFFEXOR-XR) 150 MG 24 hr capsule Take 1 capsule (150 mg total) by mouth daily with breakfast. 08/24/17  Yes Johnson, Megan P, DO  VOLTAREN 1 % GEL  06/05/16  Yes [provider]    Review of Systems  Constitutional: Negative.   HENT: Negative.   Eyes: Negative.   Respiratory: Negative.   Cardiovascular: Negative.   Gastrointestinal: Negative.   Genitourinary: Negative.   Musculoskeletal: Negative.   Skin: Negative.   Neurological: Negative.   Psychiatric/Behavioral: Negative.      Physical Exam Pulse 78   Ht 5\' 2"  (1.575 m)   Wt 238 lb (108 kg)   BMI 43.53 kg/m  No LMP recorded. Patient is postmenopausal. Physical Exam   Constitutional: She is oriented to person, place, and time. She appears well-developed and well-nourished. No distress.  Genitourinary: Vagina normal and uterus normal. Pelvic exam was performed with patient supine. There is no rash, tenderness or lesion on the right labia. There is no rash, tenderness or lesion on the left labia. Right adnexum does not display mass, does not display tenderness and does not display fullness. Left adnexum does not display mass, does not display tenderness and does not display fullness. Cervix does not exhibit motion tenderness, lesion or polyp.   Uterus is mobile. Uterus is not enlarged, tender, exhibiting a mass or irregular (is regular).  Genitourinary Comments: Vaginal epithelium changes consistent with atrophy  HENT:  Head: Normocephalic and atraumatic.  Eyes: EOM are normal. No scleral icterus.  Neck: Normal range of motion. Neck supple.  Cardiovascular: Normal rate and regular rhythm.  Pulmonary/Chest: Effort normal and breath sounds normal. No respiratory distress. She has no wheezes. She has no rales.  Abdominal: Soft. Bowel sounds are normal. She exhibits no distension and no mass. There is no tenderness. There is no rebound and no guarding.  Musculoskeletal: Normal range of motion. She exhibits no edema.  Neurological: She is alert and oriented to person, place, and time. No cranial nerve deficit.  Skin: Skin is warm and dry. No erythema.  Psychiatric: She has a normal mood and affect. Her behavior is normal. Judgment normal.    Female chaperone present for pelvic and breast  portions of the physical exam  Assessment: 57 y.o. No obstetric history on file. female here for  1. Pelvic pain   2. Vaginal atrophy   3. Dyspareunia, female      Plan: Problem List Items Addressed This Visit      Genitourinary   Vaginal atrophy     Other   Dyspareunia, female    Other Visit  Diagnoses    Pelvic pain    -  Primary   Relevant Orders   US PELVIS  TRANSVANGINAL NON-OB (TV ONLY)     Pelvic pain: No obvious etiology. Plan for workup. Will obtain a pelvic ultasound.  Vaginal atrophy and dyspareunia: Discussed that her medical comorbid condition make her a poor candidate for typical medical treatments. We discussed in detail some non-pharmacologic treatments.  She has not tried OTC lubricants. She was encouraged to use these each time and to take things slowly.  I will explore non-estrogen-based medications like prasterone Andreas Newport), which is more of a progesterone, which does get converted into estrogen to some extent.  Labeling varies on this medication in terms of contraindications to history of VTE. She has a significant history of VTE and I am highly reluctant to use any medication which might increase her risk of VTE.  I considered Osphena. However, this is also contraindicated I women with a history of VTE.    30 minutes spent in face to face discussion with > 50% spent in counseling,management, and coordination of care of her vaginal atrophy, dyspareunia, and pelvic pain.   Return in about 2 weeks (around 09/27/2017) for schedule pelvic u/s and follow up Dr. Jean Rosenthal.  Thomasene Mohair, MD 09/15/2017 11:09 AM     CC: Dorcas Carrow, DO [address] [place], [place] [address]

## 2017-09-15 ENCOUNTER — Encounter: Payer: Self-pay | Admitting: Obstetrics and Gynecology

## 2017-09-15 DIAGNOSIS — Z79899 Other long term (current) drug therapy: Secondary | ICD-10-CM | POA: Diagnosis not present

## 2017-09-15 DIAGNOSIS — Z5181 Encounter for therapeutic drug level monitoring: Secondary | ICD-10-CM | POA: Diagnosis not present

## 2017-09-15 DIAGNOSIS — N941 Unspecified dyspareunia: Secondary | ICD-10-CM | POA: Insufficient documentation

## 2017-09-16 ENCOUNTER — Ambulatory Visit (INDEPENDENT_AMBULATORY_CARE_PROVIDER_SITE_OTHER): Payer: Medicare HMO | Admitting: Vascular Surgery

## 2017-09-16 ENCOUNTER — Encounter (INDEPENDENT_AMBULATORY_CARE_PROVIDER_SITE_OTHER): Payer: Self-pay | Admitting: Vascular Surgery

## 2017-09-16 VITALS — BP 114/74 | HR 62 | Resp 16 | Ht 63.0 in | Wt 236.0 lb

## 2017-09-16 DIAGNOSIS — I83812 Varicose veins of left lower extremities with pain: Secondary | ICD-10-CM

## 2017-09-16 DIAGNOSIS — I83813 Varicose veins of bilateral lower extremities with pain: Secondary | ICD-10-CM

## 2017-09-16 NOTE — Progress Notes (Signed)
Kaylee Fox is a 57 y.o.female who presents with painful varicose veins of the left leg  Past Medical History:  Diagnosis Date  . Anticoagulant long-term use   . Anxiety   . Autoimmune hypothyroidism   . Chronic hypokalemia   . Chronic pain   . Chronic, continuous use of opioids   . Depression   . Diverticulosis   . Fatigue   . Fibromyalgia   . Herpes labialis   . Herpes simplex   . History of pulmonary embolus (PE)   . History of pulmonary hypertension   . Hypertension   . Lupus (systemic lupus erythematosus) (HCC)   . Neuromuscular disorder (HCC)   . OSA on CPAP   . Osteoporosis   . Venous ulcers of both lower extremities (HCC) 06/17/2016  . Vitamin D deficiency     Past Surgical History:  Procedure Laterality Date  . CESAREAN SECTION    . CHOLECYSTECTOMY    . COLONOSCOPY WITH PROPOFOL N/A 07/16/2016   Procedure: COLONOSCOPY WITH PROPOFOL;  Surgeon: Wyline Mood, MD;  Location: [facility] ENDOSCOPY;  Service: Endoscopy;  Laterality: N/A;  . LAPAROSCOPIC GASTRIC SLEEVE RESECTION      Current Outpatient Medications  Medication Sig Dispense Refill  . acetaminophen (TYLENOL) 325 MG tablet Take by mouth.    Marland Kitchen albuterol (PROVENTIL HFA;VENTOLIN HFA) 108 (90 Base) MCG/ACT inhaler Inhale 2 puffs into the lungs every 6 (six) hours as needed for wheezing or shortness of breath. 1 Inhaler 0  . ALPRAZolam (XANAX) 0.5 MG tablet at bedtime as needed.     Marland Kitchen amLODipine (NORVASC) 10 MG tablet Take 1 tablet (10 mg total) by mouth daily. 90 tablet 1  . apixaban (ELIQUIS) 5 MG TABS tablet Take 1 tablet (5 mg total) by mouth 2 (two) times daily. 180 tablet 1  . azaTHIOprine (IMURAN) 50 MG tablet Take 100 mg by mouth daily.    . baclofen (LIORESAL) 10 MG tablet TAKE 1 TABLET DAILY AS NEEDED FOR MUSCLE SPASMS. 90 tablet 1  . calcium carbonate (CALCIUM 600) 600 MG TABS tablet Take by mouth.    . cetirizine (ZYRTEC) 10 MG tablet Take 1 tablet (10 mg total) by mouth daily. 30 tablet 11  .  Cholecalciferol (D3-1000) 1000 units capsule Take 1,000 Units by mouth daily. Takes 2 1000 mg per day    . fluticasone (FLONASE) 50 MCG/ACT nasal spray Place 2 sprays into both nostrils daily. 16 g 12  . furosemide (LASIX) 40 MG tablet Take 2 tablets (80 mg total) by mouth 2 (two) times daily. 360 tablet 1  . gabapentin (NEURONTIN) 300 MG capsule Take 2 capsules (600 mg total) by mouth 2 (two) times daily. 360 capsule 1  . HYDROcodone-acetaminophen (NORCO) 10-325 MG tablet Take by mouth.    . hydroxychloroquine (PLAQUENIL) 200 MG tablet Take 200 mg by mouth. Take 3 200 mg tabs a day    . hydrOXYzine (ATARAX/VISTARIL) 25 MG tablet Take 1 tablet (25 mg total) by mouth at bedtime as needed. 90 tablet 1  . levothyroxine (SYNTHROID, LEVOTHROID) 75 MCG tablet TAKE 1 TABLET BY MOUTH DAILY BEFORE BREAKFAST 90 tablet 3  . montelukast (SINGULAIR) 10 MG tablet Take 1 tablet (10 mg total) by mouth at bedtime. 90 tablet 1  . Multiple Vitamin (MULTI-VITAMINS) TABS Take by mouth.    . mupirocin cream (BACTROBAN) 2 % Apply to affected area on arms/legs twice a day as needed.    . pantoprazole (PROTONIX) 40 MG tablet Take 1 tablet (40 mg total) by  mouth daily. 90 tablet 1  . potassium chloride SA (KLOR-CON M20) 20 MEQ tablet Take 1 tablet (20 mEq total) by mouth daily. 90 tablet 1  . venlafaxine XR (EFFEXOR XR) 75 MG 24 hr capsule Take 1 capsule (75 mg total) by mouth daily with breakfast. 90 capsule 1  . venlafaxine XR (EFFEXOR-XR) 150 MG 24 hr capsule Take 1 capsule (150 mg total) by mouth daily with breakfast. 90 capsule 1  . VOLTAREN 1 % GEL      No current facility-administered medications for this visit.     Allergies  Allergen Reactions  . Penicillins Rash  . Clindamycin Nausea Only  . Ciprofloxacin Other (See Comments)  . Morphine Other (See Comments)    Indication: Patient presents with symptomatic varicose veins of the left lower extremity.  Procedure: Foam sclerotherapy was performed on the  left lower extremity. Using ultrasound guidance, 5 mL of foam Sotradecol was used to inject the varicosities of the left lower extremity. Compression wraps were placed. The patient tolerated the procedure well.

## 2017-09-23 ENCOUNTER — Ambulatory Visit: Payer: Medicare HMO | Admitting: Family Medicine

## 2017-09-30 ENCOUNTER — Ambulatory Visit (INDEPENDENT_AMBULATORY_CARE_PROVIDER_SITE_OTHER): Payer: Medicare HMO | Admitting: Obstetrics and Gynecology

## 2017-09-30 ENCOUNTER — Encounter: Payer: Self-pay | Admitting: Obstetrics and Gynecology

## 2017-09-30 ENCOUNTER — Ambulatory Visit (INDEPENDENT_AMBULATORY_CARE_PROVIDER_SITE_OTHER): Payer: Medicare HMO

## 2017-09-30 VITALS — BP 122/84 | HR 76 | Ht 63.0 in | Wt 231.0 lb

## 2017-09-30 DIAGNOSIS — N941 Unspecified dyspareunia: Secondary | ICD-10-CM | POA: Diagnosis not present

## 2017-09-30 DIAGNOSIS — R102 Pelvic and perineal pain: Secondary | ICD-10-CM

## 2017-09-30 DIAGNOSIS — N952 Postmenopausal atrophic vaginitis: Secondary | ICD-10-CM | POA: Diagnosis not present

## 2017-09-30 NOTE — Progress Notes (Signed)
Gynecology Ultrasound Follow Up   Chief Complaint  Patient presents with  . Follow-up    ultrasound for lower abdominal pain    History of Present Illness: Patient is a 57 y.o. female who presents today for ultrasound evaluation of the above .  Ultrasound demonstrates the following findings Adnexa: no masses seen  Uterus: retroverted with endometrial stripe  5.0 mm Additional: no significant findings to explain her pain.   Past Medical History:  Diagnosis Date  . Anticoagulant long-term use   . Anxiety   . Autoimmune hypothyroidism   . Chronic hypokalemia   . Chronic pain   . Chronic, continuous use of opioids   . Depression   . Diverticulosis   . Fatigue   . Fibromyalgia   . Herpes labialis   . Herpes simplex   . History of pulmonary embolus (PE)   . History of pulmonary hypertension   . Hypertension   . Lupus (systemic lupus erythematosus) (HCC)   . Neuromuscular disorder (HCC)   . OSA on CPAP   . Osteoporosis   . Venous ulcers of both lower extremities (HCC) 06/17/2016  . Vitamin D deficiency     Past Surgical History:  Procedure Laterality Date  . CESAREAN SECTION    . CHOLECYSTECTOMY    . COLONOSCOPY WITH PROPOFOL N/A 07/16/2016   Procedure: COLONOSCOPY WITH PROPOFOL;  Surgeon: Kaylee Mood, MD;  Location: [facility] ENDOSCOPY;  Service: Endoscopy;  Laterality: N/A;  . LAPAROSCOPIC GASTRIC SLEEVE RESECTION      Family History  Problem Relation Age of Onset  . Arthritis Mother   . Osteoarthritis Mother   . Lupus Mother   . Hypothyroidism Mother   . Osteoarthritis Father   . Emphysema Father   . Coronary artery disease Father     Social History   Socioeconomic History  . Marital status: Legally Separated    Spouse name: Not on file  . Number of children: Not on file  . Years of education: Not on file  . Highest education level: Not on file  Occupational History  . Not on file  Social Needs  . Financial resource strain: Not hard at all  . Food  insecurity:    Worry: Never true    Inability: Never true  . Transportation needs:    Medical: No    Non-medical: No  Tobacco Use  . Smoking status: Never Smoker  . Smokeless tobacco: Never Used  Substance and Sexual Activity  . Alcohol use: No  . Drug use: No  . Sexual activity: Yes    Birth control/protection: Post-menopausal  Lifestyle  . Physical activity:    Days per week: 0 days    Minutes per session: 0 min  . Stress: Not at all  Relationships  . Social connections:    Talks on phone: More than three times a week    Gets together: More than three times a week    Attends religious service: More than 4 times per year    Active member of club or organization: No    Attends meetings of clubs or organizations: Never    Relationship status: Separated  . Intimate partner violence:    Fear of current or ex partner: No    Emotionally abused: No    Physically abused: No    Forced sexual activity: No  Other Topics Concern  . Not on file  Social History Narrative  . Not on file    Allergies  Allergen Reactions  .  Penicillins Rash  . Clindamycin Nausea Only  . Ciprofloxacin Other (See Comments)  . Morphine Other (See Comments)    Prior to Admission medications   Medication Sig Start Date End Date Taking? Authorizing Provider  acetaminophen (TYLENOL) 325 MG tablet Take by mouth. 01/27/15  Yes [provider]  albuterol (PROVENTIL HFA;VENTOLIN HFA) 108 (90 Base) MCG/ACT inhaler Inhale 2 puffs into the lungs every 6 (six) hours as needed for wheezing or shortness of breath. 08/24/17  Yes Fox, Kaylee P, DO  ALPRAZolam (XANAX) 0.5 MG tablet at bedtime as needed.  05/02/17  Yes [provider]  amLODipine (NORVASC) 10 MG tablet Take 1 tablet (10 mg total) by mouth daily. 08/24/17  Yes Fox, Kaylee P, DO  apixaban (ELIQUIS) 5 MG TABS tablet Take 1 tablet (5 mg total) by mouth 2 (two) times daily. 08/24/17  Yes Fox, Kaylee P, DO  azaTHIOprine (IMURAN) 50  MG tablet Take 100 mg by mouth daily.   Yes [provider]  baclofen (LIORESAL) 10 MG tablet TAKE 1 TABLET DAILY AS NEEDED FOR MUSCLE SPASMS. 06/06/17  Yes Fox, Kaylee P, DO  calcium carbonate (CALCIUM 600) 600 MG TABS tablet Take by mouth. 02/26/14  Yes [provider]  cetirizine (ZYRTEC) 10 MG tablet Take 1 tablet (10 mg total) by mouth daily. 08/24/17  Yes Fox, Kaylee P, DO  Cholecalciferol (D3-1000) 1000 units capsule Take 1,000 Units by mouth daily. Takes 2 1000 mg per day   Yes [provider]  fluticasone (FLONASE) 50 MCG/ACT nasal spray Place 2 sprays into both nostrils daily. 08/24/17  Yes Fox, Kaylee P, DO  furosemide (LASIX) 40 MG tablet Take 2 tablets (80 mg total) by mouth 2 (two) times daily. 08/24/17  Yes Fox, Kaylee P, DO  gabapentin (NEURONTIN) 300 MG capsule Take 2 capsules (600 mg total) by mouth 2 (two) times daily. 08/24/17  Yes Fox, Kaylee P, DO  HYDROcodone-acetaminophen (NORCO) 10-325 MG tablet Take by mouth. 09/26/09  Yes [provider]  hydroxychloroquine (PLAQUENIL) 200 MG tablet Take 200 mg by mouth. Take 3 200 mg tabs a day   Yes [provider]  hydrOXYzine (ATARAX/VISTARIL) 25 MG tablet Take 1 tablet (25 mg total) by mouth at bedtime as needed. 08/24/17  Yes Fox, Kaylee P, DO  levothyroxine (SYNTHROID, LEVOTHROID) 75 MCG tablet TAKE 1 TABLET BY MOUTH DAILY BEFORE BREAKFAST 08/26/17  Yes Fox, Kaylee P, DO  montelukast (SINGULAIR) 10 MG tablet Take 1 tablet (10 mg total) by mouth at bedtime. 08/24/17  Yes Fox, Kaylee P, DO  Multiple Vitamin (MULTI-VITAMINS) TABS Take by mouth.   Yes [provider]  mupirocin cream (BACTROBAN) 2 % Apply to affected area on arms/legs twice a day as needed. 03/15/12  Yes [provider]  pantoprazole (PROTONIX) 40 MG tablet Take 1 tablet (40 mg total) by mouth daily. 08/24/17  Yes Fox, Kaylee P, DO  potassium chloride SA (KLOR-CON M20) 20 MEQ tablet Take 1  tablet (20 mEq total) by mouth daily. 08/24/17  Yes Fox, Kaylee P, DO  venlafaxine XR (EFFEXOR XR) 75 MG 24 hr capsule Take 1 capsule (75 mg total) by mouth daily with breakfast. 08/24/17  Yes Kaylee Fox, Kaylee P, DO  venlafaxine XR (EFFEXOR-XR) 150 MG 24 hr capsule Take 1 capsule (150 mg total) by mouth daily with breakfast. 08/24/17  Yes Fox, Kaylee P, DO  VOLTAREN 1 % GEL  06/05/16  Yes [provider]    Physical Exam BP 122/84 (BP Location: Left Arm,  Patient Position: Sitting, Cuff Size: Large)   Pulse 76   Ht 5\' 3"  (1.6 m)   Wt 231 lb (104.8 kg)   SpO2 97%   BMI 40.92 kg/m    General: NAD HEENT: normocephalic, anicteric Pulmonary: No increased work of breathing Extremities: no edema, erythema, or tenderness Neurologic: Grossly intact, normal gait Psychiatric: Fox appropriate, affect full  [place] Pelvis Transvanginal Non-ob (tv Only)  Result Date: 09/30/2017 Patient Name: Kaylee Fox DOB: [birth year] MRN: [id number] ULTRASOUND REPORT Location: [facility] Date of Service: 09/30/2017 Indications:Pelvic Pain Findings: The uterus is retroverted and measures 5.73 x 4.26 x 2.61 cm. Echo texture is heterogenous without evidence of focal masses. The Endometrium measures 5.02 mm. Right Ovary measures 1.36 x 1.15 x 1.07 cm. It is normal in appearance. Left Ovary measures 1.70 x 0.92 x 1.11 cm. It is normal in appearance. Survey of the adnexa demonstrates no adnexal masses. There is no free fluid in the cul de sac. Impression: 1. Retroverted uterus with heterogenous texture. 2. Free fluid is not seen. Recommendations: Clinical correlation with the patient's History and Physical Exam. Mital bahen Leodis BinetP Patel, RDMS The ultrasound images and findings were reviewed by me and I agree with the above report. Thomasene MohairStephen Jackson, MD, Merlinda FrederickFACOG [facility], [facility] 09/30/2017 2:05 PM      Assessment: 57 y.o. No obstetric history on file.  1. Vaginal atrophy   2. Dyspareunia, female     3. Pelvic pain      Plan: Problem List Items Addressed This Visit      Genitourinary   Vaginal atrophy - Primary     Other   Dyspareunia, female   Pelvic pain     Discussed that any forms of estrogen were contra-indicated. I can not find concrete data on Intrarosa (prasterone) to recommend for or against.  US labeling does not specifically say that history of VTE is a contraindication while [place] labeling does.  I recommended trying Replens as a first line.  I recommended daily use and if she starts to get a benefit, she may be able to cut back. This is available over there counter.  We also reviewed her ultrasound.  We briefly discussed pelvic floor physical therapy. However, for dyspareunia associated with vaginal atrophy due to hypoestrogenemia, PT has not been shown to be effective.    20 minutes spent in face to face discussion with > 50% spent in counseling,management, and coordination of care of her dyspareunia, vaginal atrophy, and abdominal pain.   Thomasene MohairStephen Jackson, MD, Merlinda FrederickFACOG [facility], [facility] 09/30/2017 2:37 PM    CC: Dorcas CarrowJohnson, Kaylee P, DO [address] [place], [place] [id number]

## 2017-09-30 NOTE — Patient Instructions (Signed)
From the company website: Replens Moisture Restore External Comfort Gel is a daily vaginal moisturizer that provides continuous comfort, and alleviates irritation of the outside skin surrounding the vagina. The same hormonal changes that cause internal vaginal dryness can also contribute to external discomfort. The HydraPro ComplexT is a blend of moisturizers and vitamins to help nourish dry external vaginal skin. The gel's formula has Vitamin E and Pro-Vitamin B5 to help reduce water loss from the skin's surface, leaving behind a protective barrier to lock in moisture. Helps soothe & relieve external vaginal dryness  Estrogen-Free/Hormone-Free  Fragrance-Free  Quick Drying/Non-Messy  Helps you enjoy intimacy again Add this gel to your daily regimen to make normal daily activities like sex, walking, prolonged sitting, exercising and bike riding as comfortable as they should be.

## 2017-10-14 ENCOUNTER — Ambulatory Visit (INDEPENDENT_AMBULATORY_CARE_PROVIDER_SITE_OTHER): Payer: Medicare HMO | Admitting: Vascular Surgery

## 2017-10-18 ENCOUNTER — Ambulatory Visit: Payer: Medicare HMO | Admitting: Family Medicine

## 2017-10-28 DIAGNOSIS — M329 Systemic lupus erythematosus, unspecified: Secondary | ICD-10-CM | POA: Diagnosis not present

## 2017-10-28 DIAGNOSIS — Z7902 Long term (current) use of antithrombotics/antiplatelets: Secondary | ICD-10-CM | POA: Diagnosis not present

## 2017-10-28 DIAGNOSIS — M7552 Bursitis of left shoulder: Secondary | ICD-10-CM | POA: Diagnosis not present

## 2017-10-28 DIAGNOSIS — Z8679 Personal history of other diseases of the circulatory system: Secondary | ICD-10-CM | POA: Diagnosis not present

## 2017-10-28 DIAGNOSIS — E559 Vitamin D deficiency, unspecified: Secondary | ICD-10-CM | POA: Diagnosis not present

## 2017-10-28 DIAGNOSIS — Z86711 Personal history of pulmonary embolism: Secondary | ICD-10-CM | POA: Diagnosis not present

## 2017-10-28 DIAGNOSIS — Z6841 Body Mass Index (BMI) 40.0 and over, adult: Secondary | ICD-10-CM | POA: Diagnosis not present

## 2017-10-28 DIAGNOSIS — Z79899 Other long term (current) drug therapy: Secondary | ICD-10-CM | POA: Diagnosis not present

## 2017-11-01 ENCOUNTER — Ambulatory Visit (INDEPENDENT_AMBULATORY_CARE_PROVIDER_SITE_OTHER): Payer: Medicare HMO | Admitting: Vascular Surgery

## 2017-11-22 ENCOUNTER — Encounter (INDEPENDENT_AMBULATORY_CARE_PROVIDER_SITE_OTHER): Payer: Self-pay | Admitting: Vascular Surgery

## 2017-11-22 ENCOUNTER — Ambulatory Visit (INDEPENDENT_AMBULATORY_CARE_PROVIDER_SITE_OTHER): Payer: Medicare HMO | Admitting: Vascular Surgery

## 2017-11-22 VITALS — BP 140/90 | HR 74 | Resp 17 | Ht 63.0 in | Wt 234.0 lb

## 2017-11-22 DIAGNOSIS — I83813 Varicose veins of bilateral lower extremities with pain: Secondary | ICD-10-CM

## 2017-11-22 DIAGNOSIS — I83812 Varicose veins of left lower extremities with pain: Secondary | ICD-10-CM | POA: Diagnosis not present

## 2017-11-22 DIAGNOSIS — I83811 Varicose veins of right lower extremities with pain: Secondary | ICD-10-CM | POA: Diagnosis not present

## 2017-11-22 NOTE — Progress Notes (Signed)
Kaylee FlorasRobin Fox is a 57 y.o.female who presents with painful varicose veins of the right leg  Past Medical History:  Diagnosis Date  . Anticoagulant long-term use   . Anxiety   . Autoimmune hypothyroidism   . Chronic hypokalemia   . Chronic pain   . Chronic, continuous use of opioids   . Depression   . Diverticulosis   . Fatigue   . Fibromyalgia   . Herpes labialis   . Herpes simplex   . History of pulmonary embolus (PE)   . History of pulmonary hypertension   . Hypertension   . Lupus (systemic lupus erythematosus) (HCC)   . Neuromuscular disorder (HCC)   . OSA on CPAP   . Osteoporosis   . Venous ulcers of both lower extremities (HCC) 06/17/2016  . Vitamin D deficiency     Past Surgical History:  Procedure Laterality Date  . CESAREAN SECTION    . CHOLECYSTECTOMY    . COLONOSCOPY WITH PROPOFOL N/A 07/16/2016   Procedure: COLONOSCOPY WITH PROPOFOL;  Surgeon: Wyline MoodAnna, Kiran, MD;  [facility];  Service: Endoscopy;  Laterality: N/A;  . LAPAROSCOPIC GASTRIC SLEEVE RESECTION      Current Outpatient Medications  Medication Sig Dispense Refill  . acetaminophen (TYLENOL) 325 MG tablet Take by mouth.    Marland Kitchen. albuterol (PROVENTIL HFA;VENTOLIN HFA) 108 (90 Base) MCG/ACT inhaler Inhale 2 puffs into the lungs every 6 (six) hours as needed for wheezing or shortness of breath. 1 Inhaler 0  . ALPRAZolam (XANAX) 0.5 MG tablet at bedtime as needed.     Marland Kitchen. amLODipine (NORVASC) 10 MG tablet Take 1 tablet (10 mg total) by mouth daily. 90 tablet 1  . apixaban (ELIQUIS) 5 MG TABS tablet Take 1 tablet (5 mg total) by mouth 2 (two) times daily. 180 tablet 1  . azaTHIOprine (IMURAN) 50 MG tablet Take 100 mg by mouth daily.    . baclofen (LIORESAL) 10 MG tablet TAKE 1 TABLET DAILY AS NEEDED FOR MUSCLE SPASMS. 90 tablet 1  . calcium carbonate (CALCIUM 600) 600 MG TABS tablet Take by mouth.    . cetirizine (ZYRTEC) 10 MG tablet Take 1 tablet (10 mg total) by mouth daily. 30 tablet 11  .  Cholecalciferol (D3-1000) 1000 units capsule Take 1,000 Units by mouth daily. Takes 2 1000 mg per day    . fluticasone (FLONASE) 50 MCG/ACT nasal spray Place 2 sprays into both nostrils daily. 16 g 12  . furosemide (LASIX) 40 MG tablet Take 2 tablets (80 mg total) by mouth 2 (two) times daily. 360 tablet 1  . gabapentin (NEURONTIN) 300 MG capsule Take 2 capsules (600 mg total) by mouth 2 (two) times daily. 360 capsule 1  . HYDROcodone-acetaminophen (NORCO) 10-325 MG tablet Take by mouth.    . hydroxychloroquine (PLAQUENIL) 200 MG tablet Take 200 mg by mouth. Take 3 200 mg tabs a day    . hydrOXYzine (ATARAX/VISTARIL) 25 MG tablet Take 1 tablet (25 mg total) by mouth at bedtime as needed. 90 tablet 1  . levothyroxine (SYNTHROID, LEVOTHROID) 75 MCG tablet TAKE 1 TABLET BY MOUTH DAILY BEFORE BREAKFAST 90 tablet 3  . montelukast (SINGULAIR) 10 MG tablet Take 1 tablet (10 mg total) by mouth at bedtime. 90 tablet 1  . Multiple Vitamin (MULTI-VITAMINS) TABS Take by mouth.    . mupirocin cream (BACTROBAN) 2 % Apply to affected area on arms/legs twice a day as needed.    . pantoprazole (PROTONIX) 40 MG tablet Take 1 tablet (40 mg total) by  mouth daily. 90 tablet 1  . potassium chloride SA (KLOR-CON M20) 20 MEQ tablet Take 1 tablet (20 mEq total) by mouth daily. 90 tablet 1  . venlafaxine XR (EFFEXOR XR) 75 MG 24 hr capsule Take 1 capsule (75 mg total) by mouth daily with breakfast. 90 capsule 1  . venlafaxine XR (EFFEXOR-XR) 150 MG 24 hr capsule Take 1 capsule (150 mg total) by mouth daily with breakfast. 90 capsule 1  . VOLTAREN 1 % GEL      No current facility-administered medications for this visit.     Allergies  Allergen Reactions  . Penicillins Rash  . Clindamycin Nausea Only  . Ciprofloxacin Other (See Comments)  . Morphine Other (See Comments)    Indication: Patient presents with symptomatic varicose veins of the right lower extremity.  Procedure: Foam sclerotherapy was performed on the  right lower extremity. Using ultrasound guidance, 5 mL of foam Sotradecol was used to inject the varicosities of the right lower extremity. Compression wraps were placed. The patient tolerated the procedure well.

## 2017-11-29 ENCOUNTER — Other Ambulatory Visit: Payer: Self-pay | Admitting: Family Medicine

## 2017-11-30 NOTE — Telephone Encounter (Signed)
Baclofen refill Last Refill:Not on file # 0 Last OV: 08/24/17 PCP: Dr. Laural Benes Pharmacy:[facility]

## 2017-12-13 ENCOUNTER — Ambulatory Visit (INDEPENDENT_AMBULATORY_CARE_PROVIDER_SITE_OTHER): Payer: Medicare HMO | Admitting: Vascular Surgery

## 2018-01-02 ENCOUNTER — Ambulatory Visit (INDEPENDENT_AMBULATORY_CARE_PROVIDER_SITE_OTHER): Payer: Medicare HMO | Admitting: Family Medicine

## 2018-01-02 ENCOUNTER — Encounter: Payer: Self-pay | Admitting: Family Medicine

## 2018-01-02 VITALS — BP 123/84 | HR 72 | Temp 98.4°F | Ht 63.0 in | Wt 240.8 lb

## 2018-01-02 DIAGNOSIS — J069 Acute upper respiratory infection, unspecified: Secondary | ICD-10-CM

## 2018-01-02 MED ORDER — PREDNISONE 50 MG PO TABS: 50.0000 mg | ORAL_TABLET | Freq: Every day | ORAL | 0 refills | Status: DC

## 2018-01-02 NOTE — Progress Notes (Signed)
BP 123/84   Pulse 72   Temp 98.4 F (36.9 C) (Oral)   Ht 5\' 3"  (1.6 m)   Wt 240 lb 12.8 oz (109.2 kg)   SpO2 98%   BMI 42.66 kg/m    Subjective:    Patient ID: Kaylee Fox, female    DOB: Nov 28, 1960, 57 y.o.   MRN: [id number]  HPI: Kaylee Fox is a 57 y.o. female  Chief Complaint  Patient presents with  . URI    pt states she has had a cough, congestion, sinus pressure, and a scratchy throat for about a week    UPPER RESPIRATORY TRACT INFECTION Duration: 1 week Worst symptom: congestion Fever: no Cough: yes Shortness of breath: no Wheezing: no Chest pain: no Chest tightness: no Chest congestion: no Nasal congestion: yes Runny nose: yes Post nasal drip: yes Sneezing: yes Sore throat: yes Swollen glands: no Sinus pressure: yes Headache: yes Face pain: yes Toothache: no Ear pain: yes bilateral Ear pressure: yes bilateral Eyes red/itching:yes Eye drainage/crusting: no  Vomiting: no Rash: no Fatigue: yes Sick contacts: no Strep contacts: no  Context: stable Recurrent sinusitis: no Relief with OTC cold/cough medications: no  Treatments attempted: cold/sinus   Relevant past medical, surgical, family and social history reviewed and updated as indicated. Interim medical history since our last visit reviewed. Allergies and medications reviewed and updated.  Review of Systems  Constitutional: Positive for fatigue and fever. Negative for activity change, appetite change, chills, diaphoresis and unexpected weight change.  HENT: Positive for congestion, ear pain, postnasal drip, rhinorrhea, sinus pressure, sinus pain and sneezing. Negative for dental problem, drooling, ear discharge, facial swelling, hearing loss, mouth sores, nosebleeds, sore throat, tinnitus, trouble swallowing and voice change.   Respiratory: Positive for cough. Negative for apnea, choking, chest tightness, shortness of breath, wheezing and stridor.   Cardiovascular: Negative.   Skin:  Negative.   Psychiatric/Behavioral: Negative.     Per HPI unless specifically indicated above     Objective:    BP 123/84   Pulse 72   Temp 98.4 F (36.9 C) (Oral)   Ht 5\' 3"  (1.6 m)   Wt 240 lb 12.8 oz (109.2 kg)   SpO2 98%   BMI 42.66 kg/m   Wt Readings from Last 3 Encounters:  01/02/18 240 lb 12.8 oz (109.2 kg)  11/22/17 234 lb (106.1 kg)  09/30/17 231 lb (104.8 kg)    Physical Exam  Constitutional: She is oriented to person, place, and time. She appears well-developed and well-nourished. No distress.  HENT:  Head: Normocephalic and atraumatic.  Right Ear: Hearing and external ear normal.  Left Ear: Hearing and external ear normal.  Nose: Nose normal.  Mouth/Throat: Oropharynx is clear and moist. No oropharyngeal exudate.  Eyes: Pupils are equal, round, and reactive to light. Conjunctivae, EOM and lids are normal. Right eye exhibits no discharge. Left eye exhibits no discharge. No scleral icterus.  Neck: Normal range of motion. Neck supple. No JVD present. No tracheal deviation present. No thyromegaly present.  Cardiovascular: Normal rate, regular rhythm, normal heart sounds and intact distal pulses. Exam reveals no gallop and no friction rub.  No murmur heard. Pulmonary/Chest: Effort normal and breath sounds normal. No stridor. No respiratory distress. She has no wheezes. She has no rales. She exhibits no tenderness.  Musculoskeletal: Normal range of motion.  Lymphadenopathy:    She has no cervical adenopathy.  Neurological: She is alert and oriented to person, place, and time.  Skin: Skin is warm, dry  and intact. Capillary refill takes less than 2 seconds. No rash noted. She is not diaphoretic. No erythema. No pallor.  Psychiatric: She has a normal mood and affect. Her speech is normal and behavior is normal. Judgment and thought content normal. Cognition and memory are normal.  Nursing note and vitals reviewed.   Results for orders placed or performed in visit on  08/24/17  Urine Culture  Result Value Ref Range   Urine Culture, Routine Final report    Organism ID, Bacteria Comment   Microscopic Examination  Result Value Ref Range   WBC, UA 6-10 (A) 0 - 5 /hpf   RBC, UA 0-2 0 - 2 /hpf   Epithelial Cells (non renal) 0-10 0 - 10 /hpf   Bacteria, UA Few None seen/Few  HIV antibody  Result Value Ref Range   HIV Screen 4th Generation wRfx Non Reactive Non Reactive  Hepatitis C antibody screen  Result Value Ref Range   Hep C Virus Ab <0.1 0.0 - 0.9 s/co ratio  CBC with Differential/Platelet  Result Value Ref Range   WBC 4.4 3.4 - 10.8 x10E3/uL   RBC 3.85 3.77 - 5.28 x10E6/uL   Hemoglobin 12.7 11.1 - 15.9 g/dL   Hematocrit 16.1 09.6 - 46.6 %   MCV 100 (H) 79 - 97 fL   MCH 33.0 26.6 - 33.0 pg   MCHC 33.1 31.5 - 35.7 g/dL   RDW 04.5 40.9 - 81.1 %   Platelets 315 150 - 450 x10E3/uL   Neutrophils 53 Not Estab. %   Lymphs 38 Not Estab. %   Monocytes 6 Not Estab. %   Eos 2 Not Estab. %   Basos 1 Not Estab. %   Neutrophils Absolute 2.3 1.4 - 7.0 x10E3/uL   Lymphocytes Absolute 1.6 0.7 - 3.1 x10E3/uL   Monocytes Absolute 0.3 0.1 - 0.9 x10E3/uL   EOS (ABSOLUTE) 0.1 0.0 - 0.4 x10E3/uL   Basophils Absolute 0.1 0.0 - 0.2 x10E3/uL   Immature Granulocytes 0 Not Estab. %   Immature Grans (Abs) 0.0 0.0 - 0.1 x10E3/uL  Comprehensive metabolic panel  Result Value Ref Range   Glucose 93 65 - 99 mg/dL   BUN 13 6 - 24 mg/dL   Creatinine, Ser 9.14 0.57 - 1.00 mg/dL   GFR calc non Af Amer 102 >59 mL/min/1.73   GFR calc Af Amer 118 >59 mL/min/1.73   BUN/Creatinine Ratio 21 9 - 23   Sodium 143 134 - 144 mmol/L   Potassium 3.9 3.5 - 5.2 mmol/L   Chloride 105 96 - 106 mmol/L   CO2 25 20 - 29 mmol/L   Calcium 9.2 8.7 - 10.2 mg/dL   Total Protein 6.2 6.0 - 8.5 g/dL   Albumin 3.8 3.5 - 5.5 g/dL   Globulin, Total 2.4 1.5 - 4.5 g/dL   Albumin/Globulin Ratio 1.6 1.2 - 2.2   Bilirubin Total 0.5 0.0 - 1.2 mg/dL   Alkaline Phosphatase 72 39 - 117 IU/L   AST 23  0 - 40 IU/L   ALT 14 0 - 32 IU/L  Lipid Panel w/o Chol/HDL Ratio  Result Value Ref Range   Cholesterol, Total 210 (H) 100 - 199 mg/dL   Triglycerides 76 0 - 149 mg/dL   HDL 44 >78 mg/dL   VLDL Cholesterol Cal 15 5 - 40 mg/dL   LDL Calculated 295 (H) 0 - 99 mg/dL  Microalbumin, Urine Waived  Result Value Ref Range   Microalb, Ur Waived 150 (H) 0 - 19 mg/L  Creatinine, Urine Waived 300 10 - 300 mg/dL   Microalb/Creat Ratio 30-300 (H) <30 mg/g  TSH  Result Value Ref Range   TSH 2.080 0.450 - 4.500 uIU/mL  UA/M w/rflx Culture, Routine  Result Value Ref Range   Specific Gravity, UA 1.020 1.005 - 1.030   pH, UA 6.5 5.0 - 7.5   Color, UA Orange Yellow   Appearance Ur Cloudy (A) Clear   Leukocytes, UA 1+ (A) Negative   Protein, UA 1+ (A) Negative/Trace   Glucose, UA Negative Negative   Ketones, UA Trace (A) Negative   RBC, UA Negative Negative   Bilirubin, UA Negative Negative   Urobilinogen, Ur 2.0 (H) 0.2 - 1.0 mg/dL   Nitrite, UA Negative Negative   Microscopic Examination See below:   VITAMIN D 25 Hydroxy (Vit-D Deficiency, Fractures)  Result Value Ref Range   Vit D, 25-Hydroxy 33.6 30.0 - 100.0 ng/mL  Bayer DCA Hb A1c Waived  Result Value Ref Range   HB A1C (BAYER DCA - WAIVED) 5.2 <7.0 %      Assessment & Plan:   Problem List Items Addressed This Visit    None    Visit Diagnoses    Viral upper respiratory tract infection    -  Primary   No sign of bacterial infection. Will treat with prednisone burst. Call if not getting better or getting worse. Call with any concerns.        Follow up plan: Return if symptoms worsen or fail to improve.

## 2018-01-08 ENCOUNTER — Other Ambulatory Visit: Payer: Self-pay | Admitting: Family Medicine

## 2018-01-09 ENCOUNTER — Other Ambulatory Visit: Payer: Self-pay | Admitting: Family Medicine

## 2018-01-09 NOTE — Telephone Encounter (Signed)
Patient called, left VM to return call to office with the correct pharmacy, [facility] or [facility], to send the refill of Flonase to.

## 2018-01-09 NOTE — Telephone Encounter (Signed)
Attempted to call patient to clarify which pharmacy she wants the refills for the fluticasone to go to. No answer, left message to give the office a call back.

## 2018-01-10 ENCOUNTER — Ambulatory Visit (INDEPENDENT_AMBULATORY_CARE_PROVIDER_SITE_OTHER): Payer: Medicare HMO | Admitting: Vascular Surgery

## 2018-02-10 ENCOUNTER — Other Ambulatory Visit: Payer: Self-pay | Admitting: Family Medicine

## 2018-02-10 NOTE — Telephone Encounter (Signed)
Requested Prescriptions  Pending Prescriptions Disp Refills  . montelukast (SINGULAIR) 10 MG tablet [Pharmacy Med Name: MONTELUKAST 10MG  TABLETS] 90 tablet 0    Sig: TAKE 1 TABLET(10 MG) BY MOUTH AT BEDTIME     Pulmonology:  Leukotriene Inhibitors Passed - 02/10/2018  3:17 AM      Passed - Valid encounter within last 12 months    Recent Outpatient Visits          1 month ago Viral upper respiratory tract infection   [facility] [place] [place] [place]   5 months ago Routine general medical examination at a health care facility   [facility] [place] [place]   7 months ago Seasonal allergic rhinitis due to pollen   [facility] [place]   9 months ago Moderate episode of recurrent major depressive disorder [facility](HCC)   [facility] [place], Megan P, DO   10 months ago Nasal congestion   [facility] [place]      Future Appointments            In 6 months [facility]

## 2018-02-13 DIAGNOSIS — M5136 Other intervertebral disc degeneration, lumbar region: Secondary | ICD-10-CM | POA: Diagnosis not present

## 2018-02-13 DIAGNOSIS — M329 Systemic lupus erythematosus, unspecified: Secondary | ICD-10-CM | POA: Diagnosis not present

## 2018-02-13 DIAGNOSIS — M4696 Unspecified inflammatory spondylopathy, lumbar region: Secondary | ICD-10-CM | POA: Diagnosis not present

## 2018-02-13 DIAGNOSIS — J189 Pneumonia, unspecified organism: Secondary | ICD-10-CM | POA: Diagnosis not present

## 2018-02-13 DIAGNOSIS — J849 Interstitial pulmonary disease, unspecified: Secondary | ICD-10-CM | POA: Diagnosis not present

## 2018-02-13 DIAGNOSIS — E559 Vitamin D deficiency, unspecified: Secondary | ICD-10-CM | POA: Diagnosis not present

## 2018-02-13 DIAGNOSIS — Z79899 Other long term (current) drug therapy: Secondary | ICD-10-CM | POA: Diagnosis not present

## 2018-02-16 DIAGNOSIS — Z79899 Other long term (current) drug therapy: Secondary | ICD-10-CM | POA: Diagnosis not present

## 2018-02-16 DIAGNOSIS — H04123 Dry eye syndrome of bilateral lacrimal glands: Secondary | ICD-10-CM | POA: Diagnosis not present

## 2018-02-16 DIAGNOSIS — H35033 Hypertensive retinopathy, bilateral: Secondary | ICD-10-CM | POA: Diagnosis not present

## 2018-02-16 DIAGNOSIS — H2513 Age-related nuclear cataract, bilateral: Secondary | ICD-10-CM | POA: Diagnosis not present

## 2018-02-16 DIAGNOSIS — H43811 Vitreous degeneration, right eye: Secondary | ICD-10-CM | POA: Diagnosis not present

## 2018-03-09 ENCOUNTER — Other Ambulatory Visit: Payer: Self-pay | Admitting: Family Medicine

## 2018-03-10 NOTE — Telephone Encounter (Signed)
Requested medication (s) are due for refill today: yes  Requested medication (s) are on the active medication list: yes  Last refill:  08/24/17 for 90 and 1 refill  Future visit scheduled: yes  Notes to clinic:  Antidepressants - SNR- desvenlafaxine & venlafaxine failed  Requested Prescriptions  Pending Prescriptions Disp Refills   venlafaxine XR (EFFEXOR-XR) 150 MG 24 hr capsule [Pharmacy Med Name: VENLAFAXINE HCL ER 150 MG Capsule Extended Release 24 Hour] 90 capsule 1    Sig: TAKE 1 CAPSULE EVERY DAY WITH BREAKFAST     Psychiatry: Antidepressants - SNRI - desvenlafaxine & venlafaxine Failed - 03/09/2018  7:21 PM      Failed - LDL in normal range and within 360 days    LDL Calculated  Date Value Ref Range Status  08/24/2017 151 (H) 0 - 99 mg/dL Final         Failed - Total Cholesterol in normal range and within 360 days    Cholesterol, Total  Date Value Ref Range Status  08/24/2017 210 (H) 100 - 199 mg/dL Final         Failed - Valid encounter within last 6 months    Recent Outpatient Visits          2 months ago Viral upper respiratory tract infection   [facility] [place], Megan P, DO   6 months ago Routine general medical examination at a health care facility   [facility], [place] P, DO   8 months ago Seasonal allergic rhinitis due to pollen   [facility], Megan P, DO   10 months ago Moderate episode of recurrent major depressive disorder [facility])   [facility] [place], Megan P, DO   11 months ago Nasal congestion   [facility] [place], Megan P, DO      Future Appointments            In 5 months [facility]            Passed - Triglycerides in normal range and within 360 days    Triglycerides  Date Value Ref Range Status  08/24/2017 76 0 - 149 mg/dL Final         Passed - Completed PHQ-2 or PHQ-9 in the last 360 days.      Passed - Last BP in normal range    BP  Readings from Last 1 Encounters:  01/02/18 123/84

## 2018-03-21 DIAGNOSIS — J984 Other disorders of lung: Secondary | ICD-10-CM | POA: Diagnosis not present

## 2018-03-21 DIAGNOSIS — J189 Pneumonia, unspecified organism: Secondary | ICD-10-CM | POA: Diagnosis not present

## 2018-03-21 DIAGNOSIS — J849 Interstitial pulmonary disease, unspecified: Secondary | ICD-10-CM | POA: Diagnosis not present

## 2018-03-21 DIAGNOSIS — R918 Other nonspecific abnormal finding of lung field: Secondary | ICD-10-CM | POA: Diagnosis not present

## 2018-03-28 ENCOUNTER — Other Ambulatory Visit: Payer: Self-pay | Admitting: Family Medicine

## 2018-04-03 ENCOUNTER — Other Ambulatory Visit: Payer: Self-pay | Admitting: Family Medicine

## 2018-04-11 ENCOUNTER — Other Ambulatory Visit: Payer: Self-pay | Admitting: Family Medicine

## 2018-04-11 NOTE — Telephone Encounter (Signed)
Pt requested to change pharmacy Requested Prescriptions  Pending Prescriptions Disp Refills  . levothyroxine (SYNTHROID, LEVOTHROID) 75 MCG tablet [Pharmacy Med Name: LEVOTHYROXINE 0.075MG  ( ) TABS] 90 tablet 1    Sig: TAKE 1 TABLET BY MOUTH DAILY BEFORE BREAKFAST     Endocrinology:  Hypothyroid Agents Failed - 04/11/2018  3:18 AM      Failed - TSH needs to be rechecked within 3 months after an abnormal result. Refill until TSH is due.      Passed - TSH in normal range and within 360 days    TSH  Date Value Ref Range Status  08/24/2017 2.080 0.450 - 4.500 uIU/mL Final         Passed - Valid encounter within last 12 months    Recent Outpatient Visits          3 months ago Viral upper respiratory tract infection   [facility] [place], Megan P, DO   7 months ago Routine general medical examination at a health care facility   [facility], [place] P, DO   9 months ago Seasonal allergic rhinitis due to pollen   [facility], Megan P, DO   11 months ago Moderate episode of recurrent major depressive disorder [facility])   [facility] [place], Megan P, DO   1 year ago Nasal congestion   [facility] [place], Megan P, DO      Future Appointments            In 4 months [facility]

## 2018-04-12 ENCOUNTER — Telehealth: Payer: Self-pay | Admitting: Family Medicine

## 2018-04-12 NOTE — Telephone Encounter (Signed)
Called and left a detailed message letting patient know that the medication should be at her pharmacy.

## 2018-04-12 NOTE — Telephone Encounter (Signed)
She had a 6 month supply sent in in September. Should not be due until March

## 2018-04-12 NOTE — Telephone Encounter (Signed)
Pt. Requesting refill of her Baclofen be sent to [facility] pharmacy.

## 2018-04-20 ENCOUNTER — Other Ambulatory Visit: Payer: Self-pay

## 2018-04-20 ENCOUNTER — Ambulatory Visit (INDEPENDENT_AMBULATORY_CARE_PROVIDER_SITE_OTHER): Payer: Medicare HMO | Admitting: Family Medicine

## 2018-04-20 ENCOUNTER — Encounter: Payer: Self-pay | Admitting: Family Medicine

## 2018-04-20 VITALS — BP 125/87 | HR 65 | Temp 98.1°F | Ht 63.0 in | Wt 250.0 lb

## 2018-04-20 DIAGNOSIS — I2721 Secondary pulmonary arterial hypertension: Secondary | ICD-10-CM | POA: Diagnosis not present

## 2018-04-20 DIAGNOSIS — R233 Spontaneous ecchymoses: Secondary | ICD-10-CM | POA: Diagnosis not present

## 2018-04-20 DIAGNOSIS — E782 Mixed hyperlipidemia: Secondary | ICD-10-CM

## 2018-04-20 DIAGNOSIS — M329 Systemic lupus erythematosus, unspecified: Secondary | ICD-10-CM

## 2018-04-20 DIAGNOSIS — D899 Disorder involving the immune mechanism, unspecified: Secondary | ICD-10-CM

## 2018-04-20 DIAGNOSIS — E876 Hypokalemia: Secondary | ICD-10-CM

## 2018-04-20 DIAGNOSIS — G709 Myoneural disorder, unspecified: Secondary | ICD-10-CM

## 2018-04-20 DIAGNOSIS — F419 Anxiety disorder, unspecified: Secondary | ICD-10-CM

## 2018-04-20 DIAGNOSIS — E559 Vitamin D deficiency, unspecified: Secondary | ICD-10-CM

## 2018-04-20 DIAGNOSIS — M797 Fibromyalgia: Secondary | ICD-10-CM

## 2018-04-20 DIAGNOSIS — I129 Hypertensive chronic kidney disease with stage 1 through stage 4 chronic kidney disease, or unspecified chronic kidney disease: Secondary | ICD-10-CM

## 2018-04-20 DIAGNOSIS — D849 Immunodeficiency, unspecified: Secondary | ICD-10-CM

## 2018-04-20 DIAGNOSIS — E063 Autoimmune thyroiditis: Secondary | ICD-10-CM | POA: Diagnosis not present

## 2018-04-20 LAB — MICROSCOPIC EXAMINATION: RBC MICROSCOPIC, UA: NONE SEEN /HPF (ref 0–2)

## 2018-04-20 LAB — UA/M W/RFLX CULTURE, ROUTINE
Bilirubin, UA: NEGATIVE
Glucose, UA: NEGATIVE
KETONES UA: NEGATIVE
NITRITE UA: NEGATIVE
Protein, UA: NEGATIVE
RBC, UA: NEGATIVE
Specific Gravity, UA: 1.02 (ref 1.005–1.030)
UUROB: 1 mg/dL (ref 0.2–1.0)
pH, UA: 7 (ref 5.0–7.5)

## 2018-04-20 MED ORDER — SULFAMETHOXAZOLE-TRIMETHOPRIM 800-160 MG PO TABS: 1.0000 | ORAL_TABLET | Freq: Two times a day (BID) | ORAL | 0 refills | Status: DC

## 2018-04-20 MED ORDER — HYDROXYZINE HCL 25 MG PO TABS: 25.0000 mg | ORAL_TABLET | Freq: Every evening | ORAL | 1 refills | Status: DC | PRN

## 2018-04-20 MED ORDER — VENLAFAXINE HCL ER 150 MG PO CP24: 150.0000 mg | ORAL_CAPSULE | Freq: Every day | ORAL | 0 refills | Status: DC

## 2018-04-20 MED ORDER — FUROSEMIDE 40 MG PO TABS: 80.0000 mg | ORAL_TABLET | Freq: Two times a day (BID) | ORAL | 1 refills | Status: DC

## 2018-04-20 MED ORDER — GABAPENTIN 300 MG PO CAPS: 600.0000 mg | ORAL_CAPSULE | Freq: Two times a day (BID) | ORAL | 1 refills | Status: DC

## 2018-04-20 MED ORDER — APIXABAN 5 MG PO TABS: 5.0000 mg | ORAL_TABLET | Freq: Two times a day (BID) | ORAL | 1 refills | Status: DC

## 2018-04-20 MED ORDER — VENLAFAXINE HCL ER 75 MG PO CP24: ORAL_CAPSULE | ORAL | 1 refills | Status: DC

## 2018-04-20 MED ORDER — POTASSIUM CHLORIDE CRYS ER 20 MEQ PO TBCR: 20.0000 meq | EXTENDED_RELEASE_TABLET | Freq: Every day | ORAL | 1 refills | Status: DC

## 2018-04-20 MED ORDER — CETIRIZINE HCL 10 MG PO TABS: 10.0000 mg | ORAL_TABLET | Freq: Every day | ORAL | 11 refills | Status: DC

## 2018-04-20 MED ORDER — PANTOPRAZOLE SODIUM 40 MG PO TBEC: 40.0000 mg | DELAYED_RELEASE_TABLET | Freq: Every day | ORAL | 1 refills | Status: DC

## 2018-04-20 MED ORDER — MONTELUKAST SODIUM 10 MG PO TABS: ORAL_TABLET | ORAL | 1 refills | Status: DC

## 2018-04-20 MED ORDER — BACLOFEN 10 MG PO TABS: 10.0000 mg | ORAL_TABLET | Freq: Three times a day (TID) | ORAL | 0 refills | Status: DC

## 2018-04-20 MED ORDER — FLUTICASONE PROPIONATE 50 MCG/ACT NA SUSP: 2.0000 | Freq: Every day | NASAL | 12 refills | Status: DC

## 2018-04-20 MED ORDER — AMLODIPINE BESYLATE 10 MG PO TABS: 10.0000 mg | ORAL_TABLET | Freq: Every day | ORAL | 1 refills | Status: DC

## 2018-04-20 NOTE — Assessment & Plan Note (Signed)
Rechecking levels today. Will treat as needed. Call with any concerns.  

## 2018-04-20 NOTE — Assessment & Plan Note (Signed)
Rechecking labs today. Await results. Call with any concerns.  

## 2018-04-20 NOTE — Assessment & Plan Note (Signed)
Stable. Will keep BP under good control. Continue to monitor. Call with any concerns.

## 2018-04-20 NOTE — Assessment & Plan Note (Signed)
Labs drawn today. Await results. Call with any concerns.  

## 2018-04-20 NOTE — Assessment & Plan Note (Signed)
Stable. Continue to follow with rheumatology. Call with any concerns. Continue to monitor.  ?

## 2018-04-20 NOTE — Assessment & Plan Note (Signed)
Under good control on current regimen. Continue current regimen. Continue to monitor. Call with any concerns. Refills given. Labs drawn today.   

## 2018-04-20 NOTE — Assessment & Plan Note (Signed)
Stable. Continue to monitor. Call with any concerns. Continue to monitor.  ?

## 2018-04-20 NOTE — Assessment & Plan Note (Signed)
Under good control on current regimen. Continue current regimen. Continue to monitor. Call with any concerns. Refills given. Labs checked today.  

## 2018-04-20 NOTE — Assessment & Plan Note (Signed)
Continue to follow with rheumatology. Call with any concerns. Continue to monitor.  

## 2018-04-20 NOTE — Assessment & Plan Note (Signed)
Will treat hematoma with bactrim to avoid infection. Call with any concerns.

## 2018-04-20 NOTE — Progress Notes (Signed)
BP 125/87   Pulse 65   Temp 98.1 F (36.7 C) (Oral)   Ht 5\' 3"  (1.6 m)   Wt 250 lb (113.4 kg)   SpO2 99%   BMI 44.29 kg/m    Subjective:    Patient ID: Kaylee Fox, female    DOB: [birth year], 58 y.o.   MRN: [id number]  HPI: [name] is a 58 y.o. female  Chief Complaint  Patient presents with  . Swelling hand    pt states that she hit her left hand aginst the screen inside of the washer machine this morning   Stuck her hand into a washing machine this AM. It got stuck and has been swollen and tender since then. Full range of motion, but very swollen and tender between the joints of her 1st 2 fingers. No fevers or heat.  HYPERTENSION / HYPERLIPIDEMIA Satisfied with current treatment? yes Duration of hypertension: chronic BP monitoring frequency: not checking BP medication side effects: no Past BP meds: amlodipine, lasix Duration of hyperlipidemia: chronic Cholesterol medication side effects: not on anything Cholesterol supplements: none Past cholesterol medications: none Medication compliance: excellent compliance Aspirin: no Recent stressors: no Recurrent headaches: no Visual changes: no Palpitations: no Dyspnea: no Chest pain: no Lower extremity edema: no Dizzy/lightheaded: no  HYPOTHYROIDISM Thyroid control status:stable Satisfied with current treatment? yes Medication side effects: no Medication compliance: good compliance Recent dose adjustment:no Fatigue: no Cold intolerance: no Heat intolerance: no Weight gain: no Weight loss: no Constipation: no Diarrhea/loose stools: no Palpitations: no Lower extremity edema: no Anxiety/depressed mood: no  ANXIETY/STRESS Duration:stable Anxious mood: no  Excessive worrying: no Irritability: no  Sweating: no Nausea: no Palpitations:no Hyperventilation: no Panic attacks: no Agoraphobia: no  Obscessions/compulsions: no Depressed mood: no Depression screen [facility] 2/9 08/24/2017 08/22/2017 05/09/2017  04/08/2017 08/20/2016  Decreased Interest 0 3 0 2 0  Down, Depressed, Hopeless 3 2 0 2 0  PHQ - 2 Score 3 5 0 4 0  Altered sleeping 1 0 2 2 2   Tired, decreased energy 1 1 1 2 2   Change in appetite 0 0 0 1 0  Feeling bad or failure about yourself  3 0 0 1 0  Trouble concentrating 0 0 0 0 2  Moving slowly or fidgety/restless 0 0 0 0 0  Suicidal thoughts 0 0 0 0 0  PHQ-9 Score 8 6 3 10 6   Difficult doing work/chores Not difficult at all Not difficult at all Not difficult at all - -   Anhedonia: no Weight changes: no Insomnia: no   Hypersomnia: no Fatigue/loss of energy: no Feelings of worthlessness: no Feelings of guilt: no Impaired concentration/indecisiveness: no Suicidal ideations: no  Crying spells: no Recent Stressors/Life Changes: no   Relationship problems: no   Family stress: no     Financial stress: no    Job stress: no    Recent death/loss: no  Relevant past medical, surgical, family and social history reviewed and updated as indicated. Interim medical history since our last visit reviewed. Allergies and medications reviewed and updated.  Review of Systems  Constitutional: Negative.   Respiratory: Negative.   Musculoskeletal: Positive for joint swelling. Negative for arthralgias, back pain, gait problem, myalgias, neck pain and neck stiffness.  Skin: Positive for color change. Negative for pallor, rash and wound.  Neurological: Negative.   Psychiatric/Behavioral: Negative.     Per HPI unless specifically indicated above     Objective:    BP 125/87   Pulse 65   Temp  98.1 F (36.7 C) (Oral)   Ht 5\' 3"  (1.6 m)   Wt 250 lb (113.4 kg)   SpO2 99%   BMI 44.29 kg/m   Wt Readings from Last 3 Encounters:  04/20/18 250 lb (113.4 kg)  01/02/18 240 lb 12.8 oz (109.2 kg)  11/22/17 234 lb (106.1 kg)    Physical Exam Vitals signs and nursing note reviewed.  Constitutional:      General: She is not in acute distress.    Appearance: Normal appearance. She is not  ill-appearing, toxic-appearing or diaphoretic.  HENT:     Head: Normocephalic and atraumatic.     Right Ear: External ear normal.     Left Ear: External ear normal.     Nose: Nose normal.     Mouth/Throat:     Mouth: Mucous membranes are moist.     Pharynx: Oropharynx is clear.  Eyes:     General: No scleral icterus.       Right eye: No discharge.        Left eye: No discharge.     Extraocular Movements: Extraocular movements intact.     Conjunctiva/sclera: Conjunctivae normal.     Pupils: Pupils are equal, round, and reactive to light.  Neck:     Musculoskeletal: Normal range of motion and neck supple.  Cardiovascular:     Rate and Rhythm: Normal rate and regular rhythm.     Pulses: Normal pulses.     Heart sounds: Normal heart sounds. No murmur. No friction rub. No gallop.   Pulmonary:     Effort: Pulmonary effort is normal. No respiratory distress.     Breath sounds: Normal breath sounds. No stridor. No wheezing, rhonchi or rales.  Chest:     Chest wall: No tenderness.  Musculoskeletal: Normal range of motion.  Skin:    General: Skin is warm and dry.     Capillary Refill: Capillary refill takes less than 2 seconds.     Coloration: Skin is not jaundiced or pale.     Findings: No bruising, erythema, lesion or rash.  Neurological:     General: No focal deficit present.     Mental Status: She is alert and oriented to person, place, and time. Mental status is at baseline.  Psychiatric:        Mood and Affect: Mood normal.        Behavior: Behavior normal.        Thought Content: Thought content normal.        Judgment: Judgment normal.     Results for orders placed or performed in visit on 08/24/17  Urine Culture  Result Value Ref Range   Urine Culture, Routine Final report    Organism ID, Bacteria Comment   Microscopic Examination  Result Value Ref Range   WBC, UA 6-10 (A) 0 - 5 /hpf   RBC, UA 0-2 0 - 2 /hpf   Epithelial Cells (non renal) 0-10 0 - 10 /hpf    Bacteria, UA Few None seen/Few  HIV antibody  Result Value Ref Range   HIV Screen 4th Generation wRfx Non Reactive Non Reactive  Hepatitis C antibody screen  Result Value Ref Range   Hep C Virus Ab <0.1 0.0 - 0.9 s/co ratio  CBC with Differential/Platelet  Result Value Ref Range   WBC 4.4 3.4 - 10.8 x10E3/uL   RBC 3.85 3.77 - 5.28 x10E6/uL   Hemoglobin 12.7 11.1 - 15.9 g/dL   Hematocrit 41.3 24.4 - 46.6 %  MCV 100 (H) 79 - 97 fL   MCH 33.0 26.6 - 33.0 pg   MCHC 33.1 31.5 - 35.7 g/dL   RDW 81.1 91.4 - 78.2 %   Platelets 315 150 - 450 x10E3/uL   Neutrophils 53 Not Estab. %   Lymphs 38 Not Estab. %   Monocytes 6 Not Estab. %   Eos 2 Not Estab. %   Basos 1 Not Estab. %   Neutrophils Absolute 2.3 1.4 - 7.0 x10E3/uL   Lymphocytes Absolute 1.6 0.7 - 3.1 x10E3/uL   Monocytes Absolute 0.3 0.1 - 0.9 x10E3/uL   EOS (ABSOLUTE) 0.1 0.0 - 0.4 x10E3/uL   Basophils Absolute 0.1 0.0 - 0.2 x10E3/uL   Immature Granulocytes 0 Not Estab. %   Immature Grans (Abs) 0.0 0.0 - 0.1 x10E3/uL  Comprehensive metabolic panel  Result Value Ref Range   Glucose 93 65 - 99 mg/dL   BUN 13 6 - 24 mg/dL   Creatinine, Ser 9.56 0.57 - 1.00 mg/dL   GFR calc non Af Amer 102 >59 mL/min/1.73   GFR calc Af Amer 118 >59 mL/min/1.73   BUN/Creatinine Ratio 21 9 - 23   Sodium 143 134 - 144 mmol/L   Potassium 3.9 3.5 - 5.2 mmol/L   Chloride 105 96 - 106 mmol/L   CO2 25 20 - 29 mmol/L   Calcium 9.2 8.7 - 10.2 mg/dL   Total Protein 6.2 6.0 - 8.5 g/dL   Albumin 3.8 3.5 - 5.5 g/dL   Globulin, Total 2.4 1.5 - 4.5 g/dL   Albumin/Globulin Ratio 1.6 1.2 - 2.2   Bilirubin Total 0.5 0.0 - 1.2 mg/dL   Alkaline Phosphatase 72 39 - 117 IU/L   AST 23 0 - 40 IU/L   ALT 14 0 - 32 IU/L  Lipid Panel w/o Chol/HDL Ratio  Result Value Ref Range   Cholesterol, Total 210 (H) 100 - 199 mg/dL   Triglycerides 76 0 - 149 mg/dL   HDL 44 >21 mg/dL   VLDL Cholesterol Cal 15 5 - 40 mg/dL   LDL Calculated 308 (H) 0 - 99 mg/dL    Microalbumin, Urine Waived  Result Value Ref Range   Microalb, Ur Waived 150 (H) 0 - 19 mg/L   Creatinine, Urine Waived 300 10 - 300 mg/dL   Microalb/Creat Ratio 30-300 (H) <30 mg/g  TSH  Result Value Ref Range   TSH 2.080 0.450 - 4.500 uIU/mL  UA/M w/rflx Culture, Routine  Result Value Ref Range   Specific Gravity, UA 1.020 1.005 - 1.030   pH, UA 6.5 5.0 - 7.5   Color, UA Orange Yellow   Appearance Ur Cloudy (A) Clear   Leukocytes, UA 1+ (A) Negative   Protein, UA 1+ (A) Negative/Trace   Glucose, UA Negative Negative   Ketones, UA Trace (A) Negative   RBC, UA Negative Negative   Bilirubin, UA Negative Negative   Urobilinogen, Ur 2.0 (H) 0.2 - 1.0 mg/dL   Nitrite, UA Negative Negative   Microscopic Examination See below:   VITAMIN D 25 Hydroxy (Vit-D Deficiency, Fractures)  Result Value Ref Range   Vit D, 25-Hydroxy 33.6 30.0 - 100.0 ng/mL  Bayer DCA Hb A1c Waived  Result Value Ref Range   HB A1C (BAYER DCA - WAIVED) 5.2 <7.0 %      Assessment & Plan:   Problem List Items Addressed This Visit      Cardiovascular and Mediastinum   Pulmonary hypertensive arterial disease (HCC)    Stable. Will keep  BP under good control. Continue to monitor. Call with any concerns.       Relevant Medications   amLODipine (NORVASC) 10 MG tablet   apixaban (ELIQUIS) 5 MG TABS tablet   furosemide (LASIX) 40 MG tablet   Other Relevant Orders   CBC with Differential/Platelet   Comprehensive metabolic panel   UA/M w/rflx Culture, Routine     Endocrine   Autoimmune hypothyroidism    Rechecking levels today. Will treat as needed. Call with any concerns.       Relevant Orders   CBC with Differential/Platelet   Comprehensive metabolic panel   TSH   UA/M w/rflx Culture, Routine     Nervous and Auditory   Neuromuscular disorder (HCC)    Continue to follow with rheumatology. Call with any concerns. Continue to monitor.       Relevant Medications   baclofen (LIORESAL) 10 MG tablet    gabapentin (NEURONTIN) 300 MG capsule   hydrOXYzine (ATARAX/VISTARIL) 25 MG tablet   venlafaxine XR (EFFEXOR-XR) 150 MG 24 hr capsule   venlafaxine XR (EFFEXOR-XR) 75 MG 24 hr capsule     Genitourinary   Benign hypertensive renal disease    Under good control on current regimen. Continue current regimen. Continue to monitor. Call with any concerns. Refills given. Labs drawn today.        Relevant Orders   CBC with Differential/Platelet   Comprehensive metabolic panel   UA/M w/rflx Culture, Routine     Other   Anxiety    Under good control on current regimen. Continue current regimen. Continue to monitor. Call with any concerns. Refills given. Labs drawn today.       Relevant Medications   hydrOXYzine (ATARAX/VISTARIL) 25 MG tablet   venlafaxine XR (EFFEXOR-XR) 150 MG 24 hr capsule   venlafaxine XR (EFFEXOR-XR) 75 MG 24 hr capsule   Other Relevant Orders   CBC with Differential/Platelet   Comprehensive metabolic panel   UA/M w/rflx Culture, Routine   Vitamin D deficiency    Labs drawn today. Await results. Call with any concerns.       Relevant Orders   CBC with Differential/Platelet   Comprehensive metabolic panel   UA/M w/rflx Culture, Routine   VITAMIN D 25 Hydroxy (Vit-D Deficiency, Fractures)   Fibromyalgia    Stable. Continue to monitor. Call with any concerns. Continue to monitor.       Relevant Orders   CBC with Differential/Platelet   Comprehensive metabolic panel   UA/M w/rflx Culture, Routine   Lupus (systemic lupus erythematosus) (HCC)    Stable. Continue to follow with rheumatology. Call with any concerns. Continue to monitor.       Relevant Orders   CBC with Differential/Platelet   Comprehensive metabolic panel   UA/M w/rflx Culture, Routine   Chronic hypokalemia    Rechecking labs today. Await results. Call with any concerns.       Relevant Orders   CBC with Differential/Platelet   Comprehensive metabolic panel   UA/M w/rflx Culture,  Routine   Immunosuppression (HCC)    Will treat hematoma with bactrim to avoid infection. Call with any concerns.       Relevant Orders   CBC with Differential/Platelet   Comprehensive metabolic panel   UA/M w/rflx Culture, Routine   Mixed hyperlipidemia    Under good control on current regimen. Continue current regimen. Continue to monitor. Call with any concerns. Refills given. Labs checked today.        Relevant Medications   amLODipine (NORVASC)  10 MG tablet   apixaban (ELIQUIS) 5 MG TABS tablet   furosemide (LASIX) 40 MG tablet   Other Relevant Orders   CBC with Differential/Platelet   Comprehensive metabolic panel   Lipid Panel w/o Chol/HDL Ratio   UA/M w/rflx Culture, Routine    Other Visit Diagnoses    Spontaneous hematoma of hand    -  Primary   New today. Ice and montior. Call with any concerns.        Follow up plan: Return in about 6 months (around 10/19/2018) for Wellness/physical on same day.

## 2018-04-21 ENCOUNTER — Other Ambulatory Visit: Payer: Medicare HMO

## 2018-04-21 DIAGNOSIS — D899 Disorder involving the immune mechanism, unspecified: Secondary | ICD-10-CM | POA: Diagnosis not present

## 2018-04-21 DIAGNOSIS — E782 Mixed hyperlipidemia: Secondary | ICD-10-CM | POA: Diagnosis not present

## 2018-04-21 DIAGNOSIS — I2721 Secondary pulmonary arterial hypertension: Secondary | ICD-10-CM | POA: Diagnosis not present

## 2018-04-21 DIAGNOSIS — E063 Autoimmune thyroiditis: Secondary | ICD-10-CM | POA: Diagnosis not present

## 2018-04-21 DIAGNOSIS — E876 Hypokalemia: Secondary | ICD-10-CM | POA: Diagnosis not present

## 2018-04-21 DIAGNOSIS — M329 Systemic lupus erythematosus, unspecified: Secondary | ICD-10-CM | POA: Diagnosis not present

## 2018-04-21 DIAGNOSIS — F419 Anxiety disorder, unspecified: Secondary | ICD-10-CM | POA: Diagnosis not present

## 2018-04-21 DIAGNOSIS — E559 Vitamin D deficiency, unspecified: Secondary | ICD-10-CM | POA: Diagnosis not present

## 2018-04-21 DIAGNOSIS — I129 Hypertensive chronic kidney disease with stage 1 through stage 4 chronic kidney disease, or unspecified chronic kidney disease: Secondary | ICD-10-CM | POA: Diagnosis not present

## 2018-04-22 ENCOUNTER — Encounter: Payer: Self-pay | Admitting: Family Medicine

## 2018-04-22 LAB — COMPREHENSIVE METABOLIC PANEL
ALT: 17 IU/L (ref 0–32)
AST: 26 IU/L (ref 0–40)
Albumin/Globulin Ratio: 1.6 (ref 1.2–2.2)
Albumin: 4.2 g/dL (ref 3.8–4.9)
Alkaline Phosphatase: 82 IU/L (ref 39–117)
BUN/Creatinine Ratio: 26 — ABNORMAL HIGH (ref 9–23)
BUN: 19 mg/dL (ref 6–24)
Bilirubin Total: 0.3 mg/dL (ref 0.0–1.2)
CO2: 19 mmol/L — ABNORMAL LOW (ref 20–29)
Calcium: 8.9 mg/dL (ref 8.7–10.2)
Chloride: 106 mmol/L (ref 96–106)
Creatinine, Ser: 0.72 mg/dL (ref 0.57–1.00)
GFR calc Af Amer: 108 mL/min/{1.73_m2} (ref >59)
GFR calc non Af Amer: 94 mL/min/{1.73_m2} (ref >59)
Globulin, Total: 2.6 g/dL (ref 1.5–4.5)
Glucose: 100 mg/dL — ABNORMAL HIGH (ref 65–99)
Potassium: 4 mmol/L (ref 3.5–5.2)
Sodium: 144 mmol/L (ref 134–144)
TOTAL PROTEIN: 6.8 g/dL (ref 6.0–8.5)

## 2018-04-22 LAB — CBC WITH DIFFERENTIAL/PLATELET
Basophils Absolute: 0 10*3/uL (ref 0.0–0.2)
Basos: 1 %
EOS (ABSOLUTE): 0.1 10*3/uL (ref 0.0–0.4)
Eos: 1 %
Hematocrit: 37.1 % (ref 34.0–46.6)
Hemoglobin: 12.9 g/dL (ref 11.1–15.9)
IMMATURE GRANS (ABS): 0 10*3/uL (ref 0.0–0.1)
Immature Granulocytes: 0 %
Lymphocytes Absolute: 1.9 10*3/uL (ref 0.7–3.1)
Lymphs: 32 %
MCH: 33.9 pg — ABNORMAL HIGH (ref 26.6–33.0)
MCHC: 34.8 g/dL (ref 31.5–35.7)
MCV: 98 fL — ABNORMAL HIGH (ref 79–97)
Monocytes Absolute: 0.5 10*3/uL (ref 0.1–0.9)
Monocytes: 8 %
Neutrophils Absolute: 3.4 10*3/uL (ref 1.4–7.0)
Neutrophils: 58 %
PLATELETS: 290 10*3/uL (ref 150–450)
RBC: 3.8 x10E6/uL (ref 3.77–5.28)
RDW: 12.5 % (ref 11.7–15.4)
WBC: 5.8 10*3/uL (ref 3.4–10.8)

## 2018-04-22 LAB — LIPID PANEL W/O CHOL/HDL RATIO
Cholesterol, Total: 229 mg/dL — ABNORMAL HIGH (ref 100–199)
HDL: 53 mg/dL (ref >39)
LDL Calculated: 150 mg/dL — ABNORMAL HIGH (ref 0–99)
Triglycerides: 128 mg/dL (ref 0–149)
VLDL Cholesterol Cal: 26 mg/dL (ref 5–40)

## 2018-04-22 LAB — VITAMIN D 25 HYDROXY (VIT D DEFICIENCY, FRACTURES): Vit D, 25-Hydroxy: 32.8 ng/mL (ref 30.0–100.0)

## 2018-04-22 LAB — TSH: TSH: 2.19 u[IU]/mL (ref 0.450–4.500)

## 2018-04-22 MED ORDER — LEVOTHYROXINE SODIUM 75 MCG PO TABS: 75.0000 ug | ORAL_TABLET | Freq: Every day | ORAL | 3 refills | Status: DC

## 2018-04-30 IMAGING — CR DG HAND COMPLETE 3+V*R*
1 series · 3 of 3 positions shown · non-contrast
Comparison: No recent prior .

CLINICAL DATA: Lupus.

EXAM:
RIGHT HAND - COMPLETE 3+ VIEW

[Series 1: dg hand complete right · 0.14mm/px · 3 of 3 slices shown]
[im 1/3]
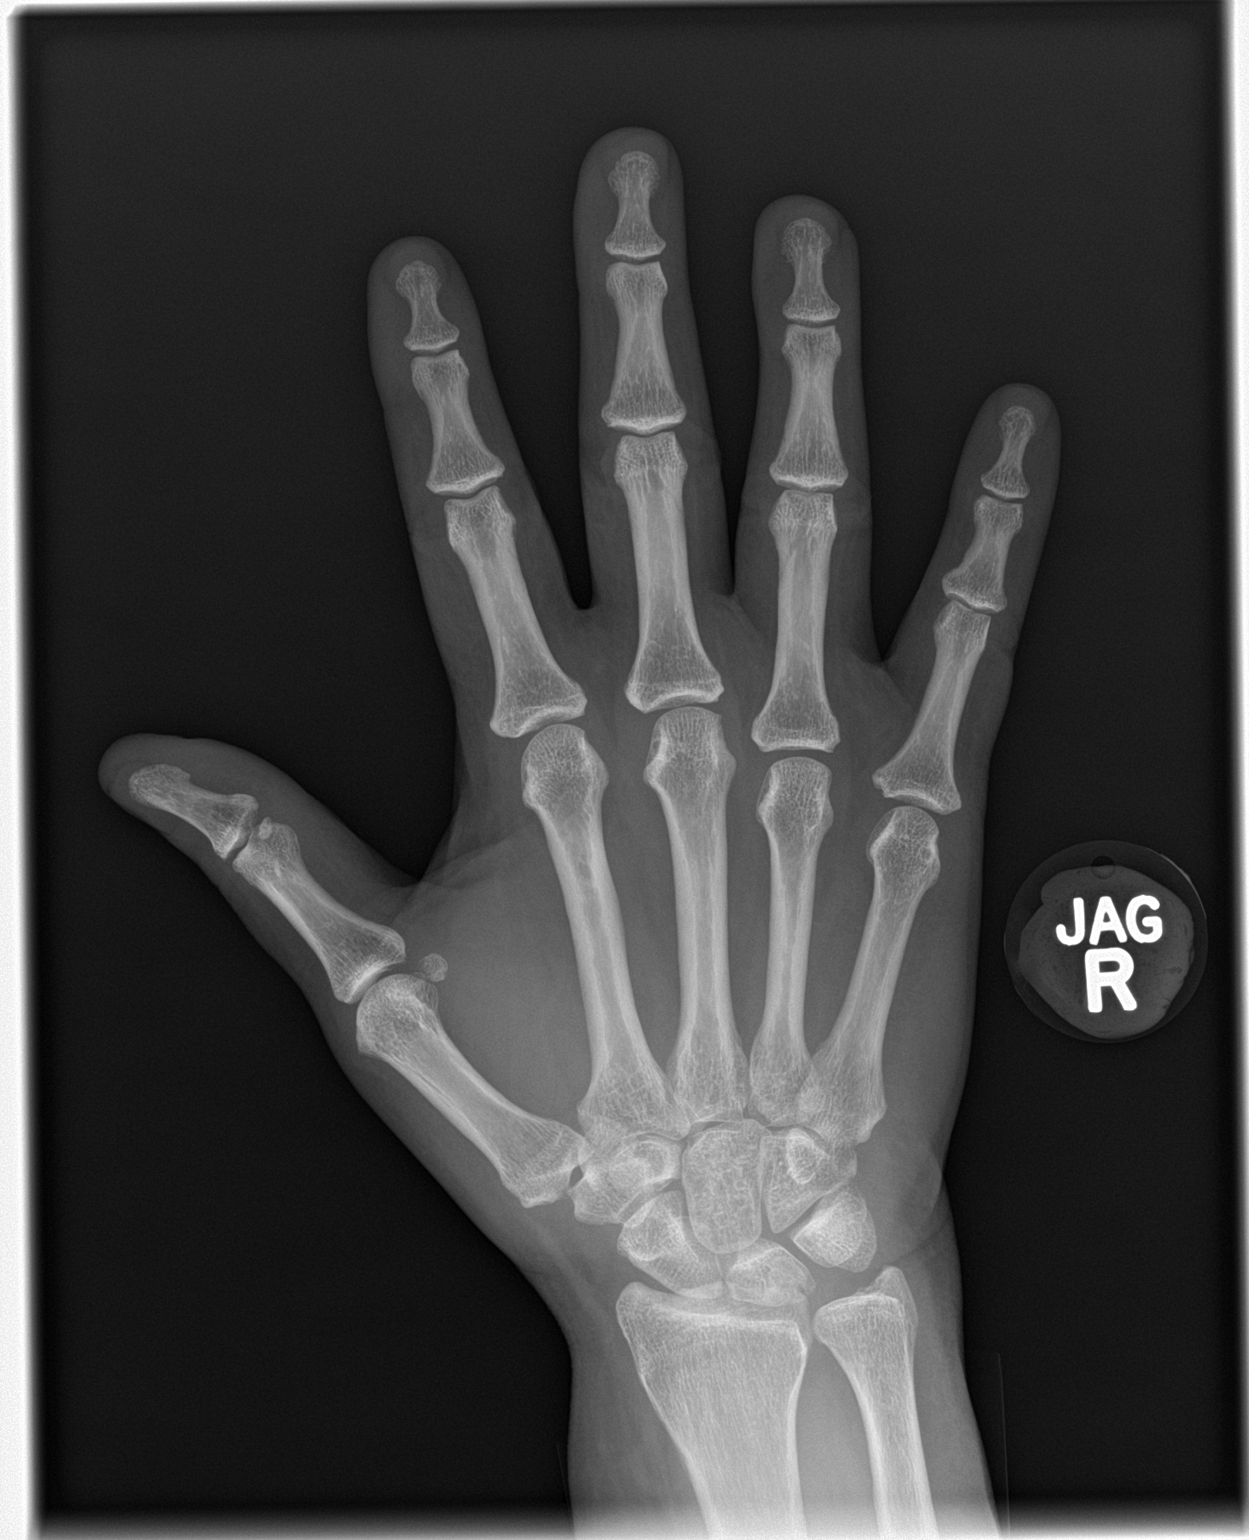
[im 2/3]
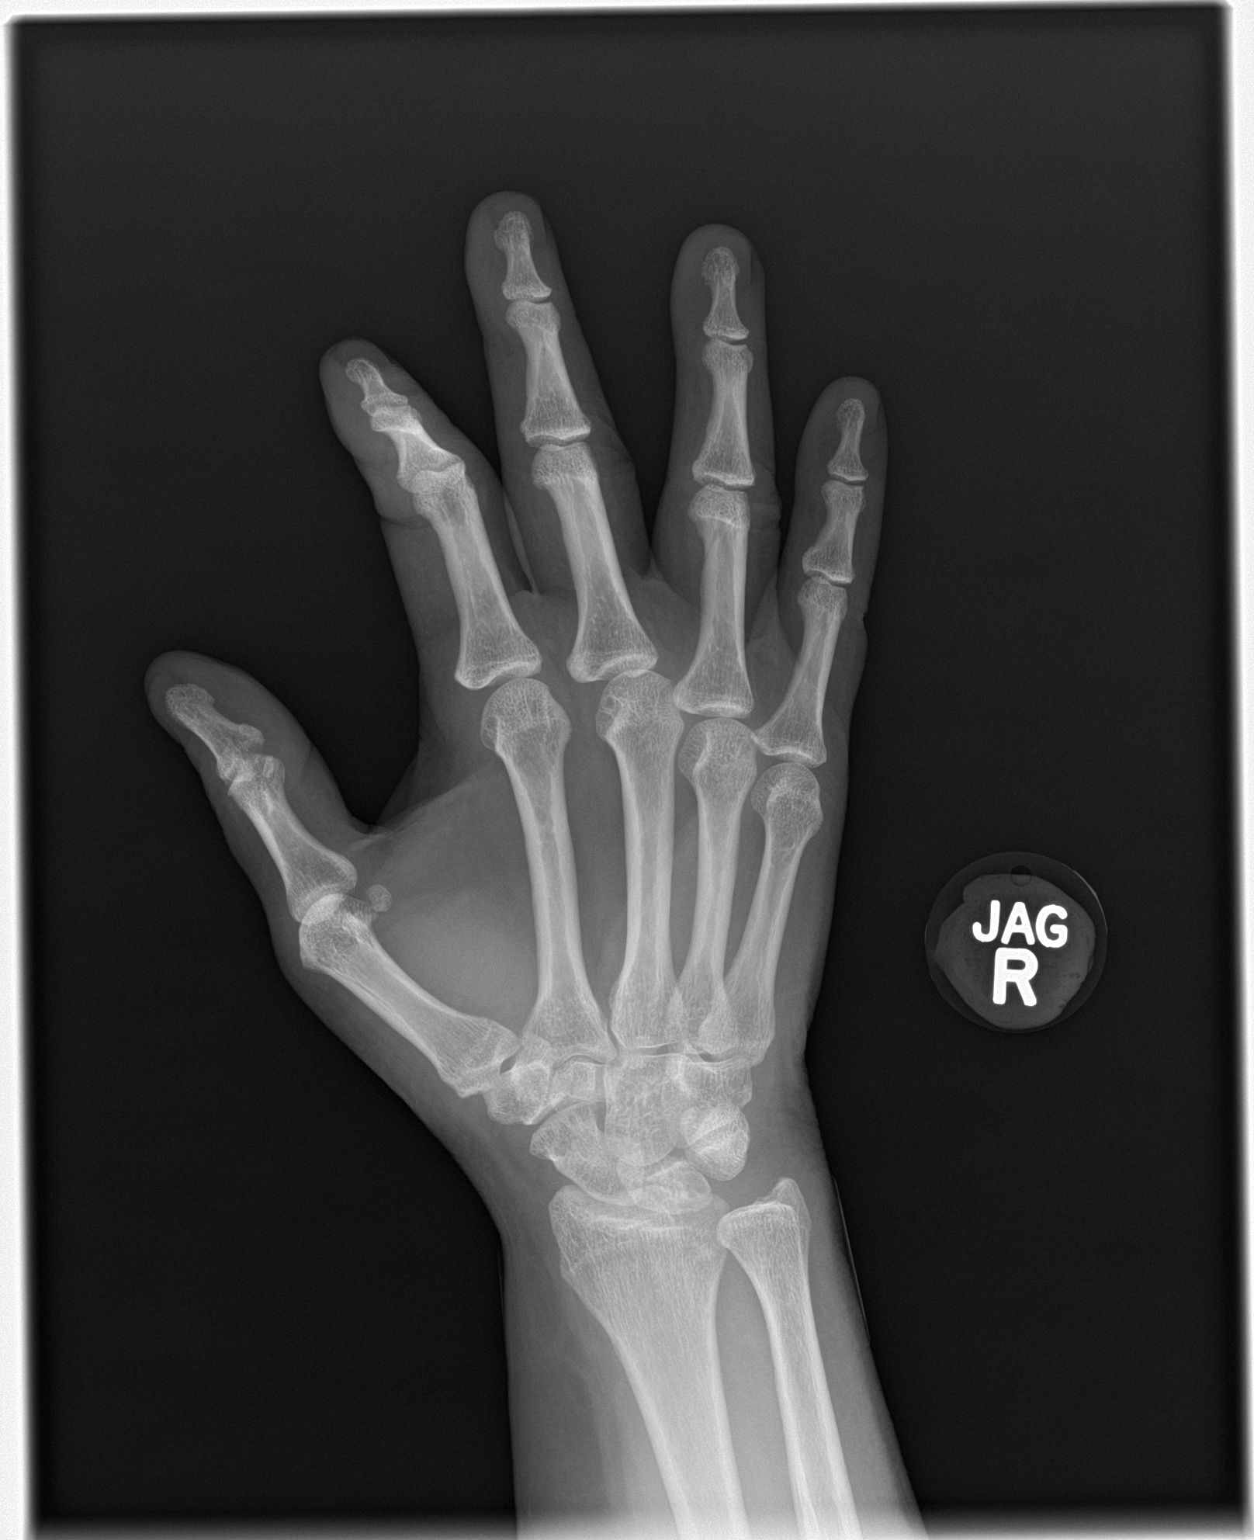
[im 3/3]
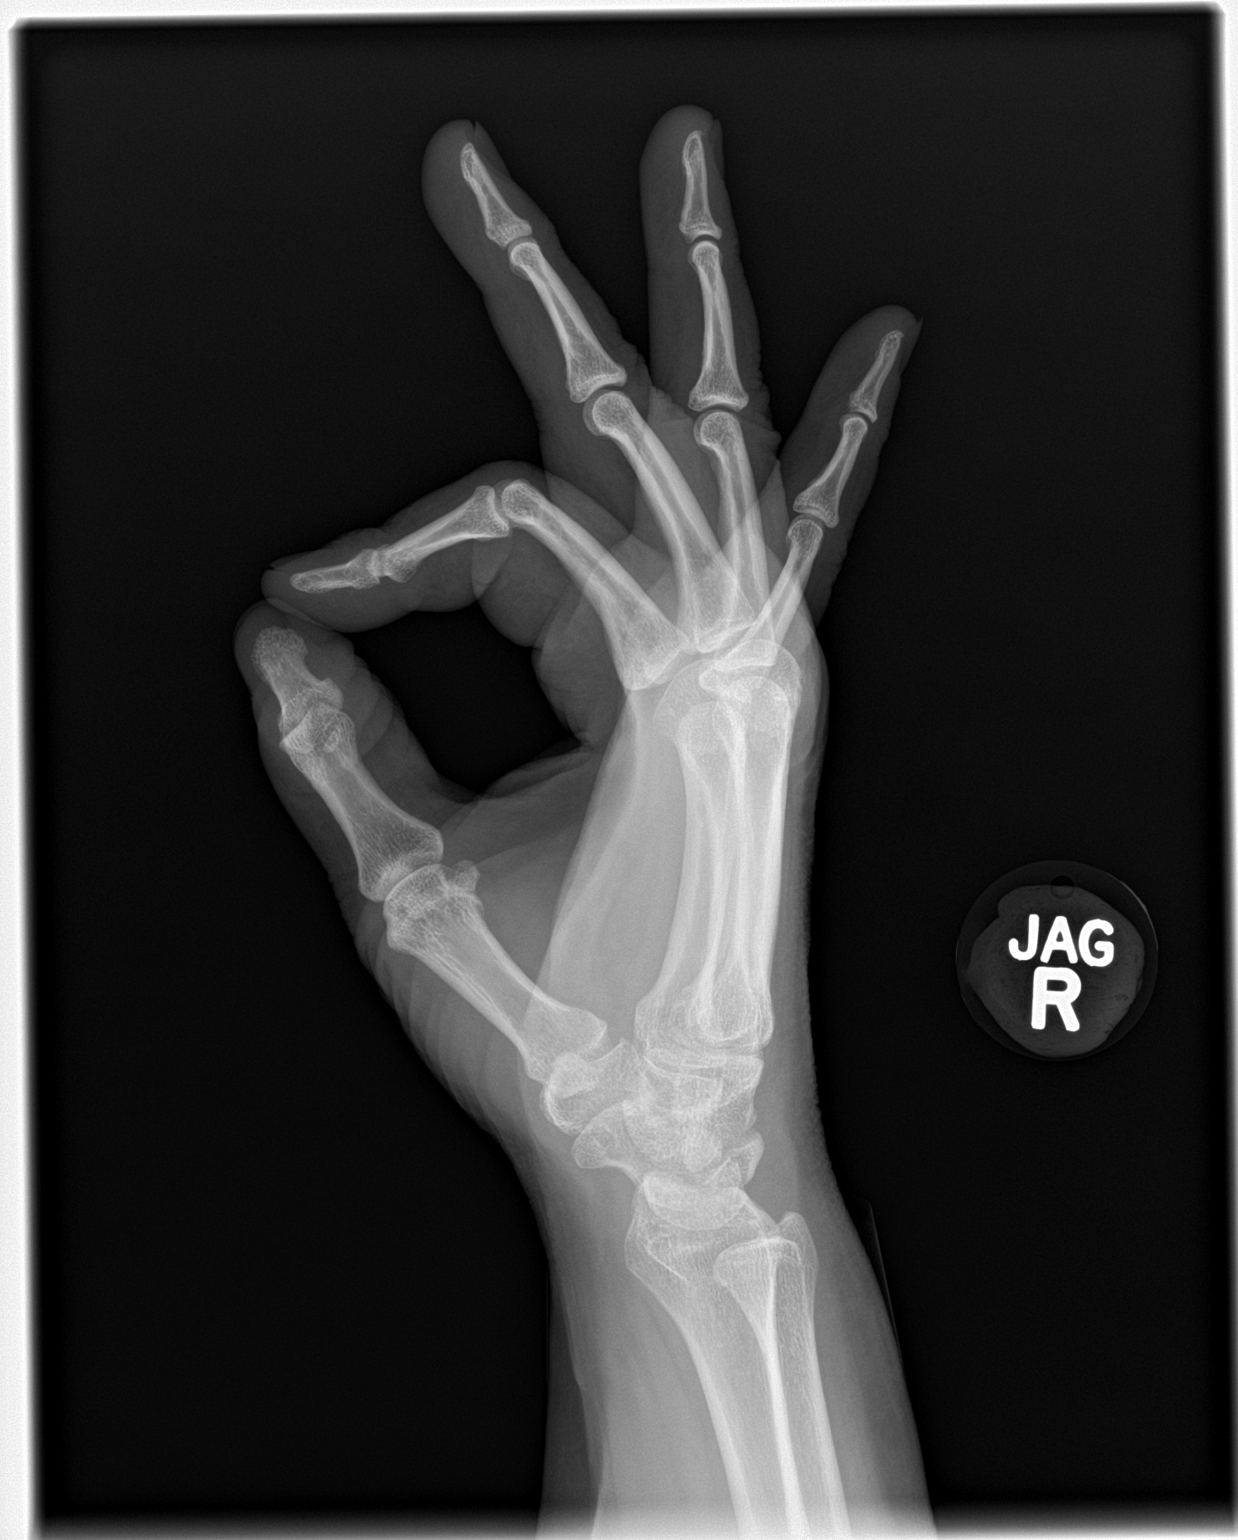

[3 of 3 positions shown; findings below may reference images not displayed]

FINDINGS: Soft tissue structures are unremarkable. Mild diffuse degenerative
change noted involving the metacarpal phalangeal noted about the
interphalangeal joints . Similar findings noted about the wrist.
Small carpal cysts noted are most likely degenerative. No
periarticular osteopenia. Ulnar styloid intact.
IMPRESSION: Mild diffuse degenerative change noted.

## 2018-05-01 ENCOUNTER — Ambulatory Visit: Payer: Self-pay | Admitting: Family Medicine

## 2018-05-02 ENCOUNTER — Other Ambulatory Visit: Payer: Self-pay | Admitting: Family Medicine

## 2018-05-03 NOTE — Telephone Encounter (Signed)
I called the pt to clarify why she needed a refill on the Bactrim DS.   She informed me it was a mistake and she did not need the refill.

## 2018-05-11 ENCOUNTER — Other Ambulatory Visit: Payer: Self-pay | Admitting: Family Medicine

## 2018-05-17 ENCOUNTER — Other Ambulatory Visit: Payer: Self-pay | Admitting: Family Medicine

## 2018-06-02 ENCOUNTER — Telehealth: Payer: Self-pay

## 2018-06-06 ENCOUNTER — Telehealth: Payer: Self-pay

## 2018-06-13 ENCOUNTER — Other Ambulatory Visit: Payer: Self-pay | Admitting: Family Medicine

## 2018-06-13 MED ORDER — BACLOFEN 10 MG PO TABS: 10.0000 mg | ORAL_TABLET | Freq: Three times a day (TID) | ORAL | 0 refills | Status: DC

## 2018-06-13 NOTE — Telephone Encounter (Signed)
Requested medication (s) are due for refill today: yes  Requested medication (s) are on the active medication list: yes  Last refill:  05/17/2018  Future visit scheduled: yes  Notes to clinic:  Not delegated   Requested Prescriptions  Pending Prescriptions Disp Refills   baclofen (LIORESAL) 10 MG tablet 90 tablet 0    Sig: Take 1 tablet (10 mg total) by mouth 3 (three) times daily.     Not Delegated - Analgesics:  Muscle Relaxants Failed - 06/13/2018  8:29 AM      Failed - This refill cannot be delegated      Passed - Valid encounter within last 6 months    Recent Outpatient Visits          1 month ago Spontaneous hematoma of hand   [facility] [place], Megan P, DO   5 months ago Viral upper respiratory tract infection   [facility] [place], Megan P, DO   9 months ago Routine general medical examination at a health care facility   [facility], [place] P, DO   11 months ago Seasonal allergic rhinitis due to pollen   [facility], Megan P, DO   1 year ago Moderate episode of recurrent major depressive disorder [facility])   [facility] Dorcas Carrow, DO      Future Appointments            In 4 months Johnson, Oralia Rud, DO [facility], [facility]   In 4 months  [facility], [facility]

## 2018-06-14 ENCOUNTER — Telehealth: Payer: Medicare HMO

## 2018-06-21 ENCOUNTER — Telehealth: Payer: Self-pay

## 2018-06-23 ENCOUNTER — Ambulatory Visit: Payer: Self-pay | Admitting: Licensed Clinical Social Worker

## 2018-06-23 NOTE — Chronic Care Management (AMB) (Signed)
  Care Management Note   Kaylee Fox is a 58 y.o. year old female who is a primary care patient of Dorcas Carrow, DO. The CM team was consulted for assistance with chronic disease management and care coordination.   I reached out to [name] by phone today but was unable to reach patient successfully. HIPPA compliant voice message left encouraging a return call once available. LCSW will schedule another outreach attempt.  Review of patient status, including review of consultants reports, relevant laboratory and other test results, and collaboration with appropriate care team members and the patient's provider was performed as part of comprehensive patient evaluation and provision of chronic care management services.   Follow Up Plan: The CM team will reach out to the patient again over the next 14-21 days days.   Dickie La, BSW, MSW, LCSW [facility] Care Management [place]  [facility] [email or web address] Phone: [phone]

## 2018-07-10 ENCOUNTER — Other Ambulatory Visit: Payer: Self-pay | Admitting: Family Medicine

## 2018-07-12 ENCOUNTER — Telehealth: Payer: Self-pay

## 2018-07-14 ENCOUNTER — Ambulatory Visit: Payer: Self-pay | Admitting: Licensed Clinical Social Worker

## 2018-07-14 ENCOUNTER — Telehealth: Payer: Self-pay

## 2018-07-14 NOTE — Chronic Care Management (AMB) (Signed)
  Care Management Note   Brehana Schmuck is a 58 y.o. year old female who is a primary care patient of Dorcas Carrow, DO . The CM team was consulted for assistance with Mental Health Counseling and Resources.   Review of patient status, including review of consultants reports, rand collaboration with appropriate care team members and the patient's provider was performed as part of comprehensive patient evaluation and provision of chronic care management services. Telephone outreach to patient today to introduce CCM services.   I reached out to [name] by phone today but was unable to reach patient successfully. LCSW left a HIPPA compliant voice message encouraging patient to return call once available. LCSW continues to have difficulty establishing contact with patient.  Follow Up Plan: SW will follow up with patient by phone over the next 30 days  Dickie La, BSW, MSW, LCSW [facility] Care Management [place]  [facility] [email or web address] Phone: [phone]

## 2018-07-17 ENCOUNTER — Other Ambulatory Visit: Payer: Self-pay | Admitting: Family Medicine

## 2018-08-02 ENCOUNTER — Ambulatory Visit: Payer: Self-pay | Admitting: Licensed Clinical Social Worker

## 2018-08-02 NOTE — Chronic Care Management (AMB) (Signed)
  Chronic Care Management    Clinical Social Work General Note  08/02/2018 Name: Kaylee Fox MRN: [id number] DOB: 1961/01/09  Kaylee Fox is a 57 y.o. year old female who is a primary care patient of Dorcas Carrow, DO. The CCM was consulted to assist the patient. LCSW completed third outreach attempt today and was able to reach someone by phone successfully. However, when LCSW requested to speak to patient, phone line immediately disconnected. LCSW will make one final outreach attempt within the next two weeks.  Review of patient status, including review of consultants reports, relevant laboratory and other test results, and collaboration with appropriate care team members and the patient's provider was performed as part of comprehensive patient evaluation and provision of chronic care management services.    Follow Up Plan: SW will follow up with patient by phone over the next 2 weeks      Dickie La, BSW, MSW, LCSW [facility] Care [facility] [place]  [facility] [email or web address] Phone: [phone]

## 2018-08-07 ENCOUNTER — Telehealth: Payer: Self-pay | Admitting: Family Medicine

## 2018-08-07 NOTE — Telephone Encounter (Signed)
Copied from CRM [id number]. Topic: Quick Communication - Rx Refill/Question >> Aug 07, 2018  9:57 AM Gwenlyn Fudge A wrote: Medication: baclofen (LIORESAL) 10 MG tablet  Has the patient contacted their pharmacy? Yes.   (Agent: If no, request that the patient contact the pharmacy for the refill.) (Agent: If yes, when and what did the pharmacy advise?)  Preferred Pharmacy (with phone number or street name): [id number] [place], [place] - [address] AT [facility] [address] [facility] [address] [place] [address] Phone: [phone] Fax: [phone] Not a 24 hour pharmacy; exact hours not known.    Agent: Please be advised that RX refills may take up to 3 business days. We ask that you follow-up with your pharmacy.

## 2018-08-08 MED ORDER — BACLOFEN 10 MG PO TABS: 10.0000 mg | ORAL_TABLET | Freq: Three times a day (TID) | ORAL | 3 refills | Status: DC

## 2018-08-08 MED ORDER — BACLOFEN 10 MG PO TABS: 10.0000 mg | ORAL_TABLET | Freq: Three times a day (TID) | ORAL | 0 refills | Status: DC

## 2018-08-08 NOTE — Addendum Note (Signed)
Addended by: Dorcas Carrow on: 08/08/2018 03:17 PM   Modules accepted: Orders

## 2018-08-08 NOTE — Telephone Encounter (Signed)
Patient needs prescription sent to  [id number] [place], [place] [address] at [facility] [address] [place]. Patients prescription went to [facility]. Patient has no pills and will not get the mail until 7 days. Call back # [phone]

## 2018-08-11 ENCOUNTER — Other Ambulatory Visit: Payer: Self-pay

## 2018-08-11 ENCOUNTER — Ambulatory Visit: Payer: Self-pay | Admitting: Licensed Clinical Social Worker

## 2018-08-11 DIAGNOSIS — F419 Anxiety disorder, unspecified: Secondary | ICD-10-CM

## 2018-08-11 NOTE — Chronic Care Management (AMB) (Signed)
  Care Management Note   Kaylee Fox is a 58 y.o. year old female who is a primary care patient of Valerie Roys, DO. The CM team was consulted for assistance with chronic disease management and care coordination.   I reached out to Meredith Staggers by phone today. Patient is agreeable to CCM services but denies any current case management needs that she wishes to work on at this time. Patient was advised to contact CCM program if any needs arise.  [name] was given information about Chronic Care Management services today including:  1. CCM service includes personalized support from designated clinical staff supervised by her physician, including individualized plan of care and coordination with other care providers 2. 24/7 contact phone numbers for assistance for urgent and routine care needs. 3. Service will only be billed when office clinical staff spend 20 minutes or more in a month to coordinate care. 4. Only one practitioner may furnish and bill the service in a calendar month. 5. The patient may stop CCM services at any time (effective at the end of the month) by phone call to the office staff. 6. The patient will be responsible for cost sharing (co-pay) of up to 20% of the service fee (after annual deductible is met).  Patient agreed to services and verbal consent obtained.    Review of patient status, including review of consultants reports, relevant laboratory and other test results, and collaboration with appropriate care team members and the patient's provider was performed as part of comprehensive patient evaluation and provision of chronic care management services.   Follow Up Plan: The patient will reach out to CCM program to address future case management needs if they arise. Patient denies any current case management needs at this time.  Eula Fried, BSW, MSW, [facility] Care Management [email or web address] Phone: [phone]

## 2018-08-15 ENCOUNTER — Other Ambulatory Visit: Payer: Self-pay | Admitting: Family Medicine

## 2018-08-15 NOTE — Telephone Encounter (Signed)
Please advise 

## 2018-08-28 ENCOUNTER — Ambulatory Visit: Payer: Medicare HMO

## 2018-10-05 ENCOUNTER — Other Ambulatory Visit: Payer: Self-pay

## 2018-10-05 MED ORDER — VENLAFAXINE HCL ER 150 MG PO CP24: 150.0000 mg | ORAL_CAPSULE | Freq: Every day | ORAL | 0 refills | Status: DC

## 2018-10-05 NOTE — Telephone Encounter (Signed)
Patient last seen 04/20/18 and has f/up 10/30/18.

## 2018-10-11 ENCOUNTER — Other Ambulatory Visit: Payer: Self-pay | Admitting: Family Medicine

## 2018-10-30 ENCOUNTER — Ambulatory Visit: Payer: Medicare HMO

## 2018-10-30 ENCOUNTER — Encounter: Payer: Medicare HMO | Admitting: Family Medicine

## 2018-10-31 ENCOUNTER — Other Ambulatory Visit: Payer: Self-pay | Admitting: Family Medicine

## 2018-10-31 NOTE — Telephone Encounter (Signed)
Requested medication (s) are due for refill today: yes  Requested medication (s) are on the active medication list: yes  Last refill:  08/07/2018  Future visit scheduled: yes  Notes to clinic: This refill cannot be delegated    Requested Prescriptions  Pending Prescriptions Disp Refills   baclofen (LIORESAL) 10 MG tablet [Pharmacy Med Name: BACLOFEN 10 MG Tablet] 270 tablet     Sig: TAKE 1 TABLET THREE TIMES DAILY     Not Delegated - Analgesics:  Muscle Relaxants Failed - 10/31/2018  1:37 PM      Failed - This refill cannot be delegated      Failed - Valid encounter within last 6 months    Recent Outpatient Visits          6 months ago Spontaneous hematoma of hand   [place], Megan P, DO   10 months ago Viral upper respiratory tract infection   [place], [place], DO   1 year ago Routine general medical examination at a health care facility   [facility], [place], DO   1 year ago Seasonal allergic rhinitis due to pollen   [facility], Megan P, DO   1 year ago Moderate episode of recurrent major depressive disorder [facility]   [place], Megan P, DO      Future Appointments            In 2 days  [facility], [place]   In 1 month [place], Megan P, DO [facility], [facility]

## 2018-11-02 ENCOUNTER — Ambulatory Visit (INDEPENDENT_AMBULATORY_CARE_PROVIDER_SITE_OTHER): Payer: Medicare HMO

## 2018-11-02 DIAGNOSIS — Z1239 Encounter for other screening for malignant neoplasm of breast: Secondary | ICD-10-CM

## 2018-11-02 DIAGNOSIS — Z0000 Encounter for general adult medical examination without abnormal findings: Secondary | ICD-10-CM

## 2018-11-02 NOTE — Progress Notes (Signed)
Subjective:   Marylee FlorasRobin Kops is a 58 y.o. female who presents for Medicare Annual (Subsequent) preventive examination.  This visit is being conducted via phone call  - after an attmept to do on video chat - due to the COVID-19 pandemic. This patient has given me verbal consent via phone to conduct this visit, patient states they are participating from their home address. Some vital signs may be absent or patient reported.   Patient identification: identified by name, DOB, and current address.    Review of Systems:    Cardiac Risk Factors include: advanced age (>6455men, 67>65 women);hypertension;dyslipidemia     Objective:     Vitals: There were no vitals taken for this visit.  There is no height or weight on file to calculate BMI.  Advanced Directives 11/02/2018 08/22/2017 11/09/2016 08/20/2016 07/13/2016 06/22/2016  Does Patient Have a Medical Advance Directive? Yes No No No No No  Type of Advance Directive Living will;Healthcare Power of Attorney - - - - -  Copy of Healthcare Power of Attorney in Chart? No - copy requested - - - - -  Would patient like information on creating a medical advance directive? - Yes (MAU/Ambulatory/Procedural Areas - Information given) - Yes (MAU/Ambulatory/Procedural Areas - Information given) - -    Tobacco Social History   Tobacco Use  Smoking Status Never Smoker  Smokeless Tobacco Never Used     Counseling given: Not Answered   Clinical Intake:  Pre-visit preparation completed: Yes  Pain : No/denies pain     Nutritional Risks: None Diabetes: No  How often do you need to have someone help you when you read instructions, pamphlets, or other written materials from your doctor or pharmacy?: 1 - Never  Interpreter Needed?: No  Information entered by ::  ,LPN  Past Medical History:  Diagnosis Date  . Anticoagulant long-term use   . Anxiety   . Autoimmune hypothyroidism   . Chronic hypokalemia   . Chronic pain   . Chronic,  continuous use of opioids   . Depression   . Diverticulosis   . Fatigue   . Fibromyalgia   . Herpes labialis   . Herpes simplex   . History of pulmonary embolus (PE)   . History of pulmonary hypertension   . Hypertension   . Lupus (systemic lupus erythematosus) (HCC)   . Neuromuscular disorder (HCC)   . OSA on CPAP   . Osteoporosis   . Venous ulcers of both lower extremities (HCC) 06/17/2016  . Vitamin D deficiency    Past Surgical History:  Procedure Laterality Date  . CESAREAN SECTION    . CHOLECYSTECTOMY    . COLONOSCOPY WITH PROPOFOL N/A 07/16/2016   Procedure: COLONOSCOPY WITH PROPOFOL;  Surgeon: Wyline MoodAnna, Kiran, MD;  [facility];  Service: Endoscopy;  Laterality: N/A;  . LAPAROSCOPIC GASTRIC SLEEVE RESECTION     Family History  Problem Relation Age of Onset  . Arthritis Mother   . Osteoarthritis Mother   . Lupus Mother   . Hypothyroidism Mother   . Osteoarthritis Father   . Emphysema Father   . Coronary artery disease Father    Social History   Socioeconomic History  . Marital status: Legally Separated    Spouse name: Not on file  . Number of children: Not on file  . Years of education: Not on file  . Highest education level: High school graduate  Occupational History  . Occupation: disability   Social Needs  . Financial resource strain: Not hard  at all  . Food insecurity    Worry: Never true    Inability: Never true  . Transportation needs    Medical: No    Non-medical: No  Tobacco Use  . Smoking status: Never Smoker  . Smokeless tobacco: Never Used  Substance and Sexual Activity  . Alcohol use: No  . Drug use: No  . Sexual activity: Yes    Birth control/protection: Post-menopausal  Lifestyle  . Physical activity    Days per week: 3 days    Minutes per session: 30 min  . Stress: Not at all  Relationships  . Social connections    Talks on phone: More than three times a week    Gets together: More than three times a week    Attends  religious service: More than 4 times per year    Active member of club or organization: No    Attends meetings of clubs or organizations: Never    Relationship status: Separated  Other Topics Concern  . Not on file  Social History Narrative  . Not on file    Outpatient Encounter Medications as of 11/02/2018  Medication Sig  . acetaminophen (TYLENOL) 325 MG tablet Take by mouth.  Marland Kitchen. albuterol (PROVENTIL HFA;VENTOLIN HFA) 108 (90 Base) MCG/ACT inhaler Inhale 2 puffs into the lungs every 6 (six) hours as needed for wheezing or shortness of breath.  . ALPRAZolam (XANAX) 0.5 MG tablet at bedtime as needed.   Marland Kitchen. amLODipine (NORVASC) 10 MG tablet Take 1 tablet (10 mg total) by mouth daily.  Marland Kitchen. apixaban (ELIQUIS) 5 MG TABS tablet Take 1 tablet (5 mg total) by mouth 2 (two) times daily.  Marland Kitchen. azaTHIOprine (IMURAN) 50 MG tablet Take 100 mg by mouth daily.  . baclofen (LIORESAL) 10 MG tablet TAKE 1 TABLET THREE TIMES DAILY  . calcium carbonate (CALCIUM 600) 600 MG TABS tablet Take by mouth.  . cetirizine (ZYRTEC) 10 MG tablet Take 1 tablet (10 mg total) by mouth daily.  . Cholecalciferol (D3-1000) 1000 units capsule Take 1,000 Units by mouth daily. Takes 2 1000 mg per day  . fluticasone (FLONASE) 50 MCG/ACT nasal spray Place 2 sprays into both nostrils daily.  . furosemide (LASIX) 40 MG tablet TAKE 2 TABLETS TWICE DAILY  . gabapentin (NEURONTIN) 300 MG capsule Take 2 capsules (600 mg total) by mouth 2 (two) times daily.  . hydroxychloroquine (PLAQUENIL) 200 MG tablet Take 200 mg by mouth. Take 2 200 mg tabs a day  . hydrOXYzine (ATARAX/VISTARIL) 25 MG tablet TAKE 1 TABLET BY MOUTH AT BEDTIME AS NEEDED  . levothyroxine (SYNTHROID, LEVOTHROID) 75 MCG tablet Take 1 tablet (75 mcg total) by mouth daily before breakfast.  . montelukast (SINGULAIR) 10 MG tablet TAKE 1 TABLET AT BEDTIME  . Multiple Vitamin (MULTI-VITAMINS) TABS Take by mouth.  . mupirocin cream (BACTROBAN) 2 % Apply to affected area on  arms/legs twice a day as needed.  . pantoprazole (PROTONIX) 40 MG tablet TAKE 1 TABLET EVERY DAY  . potassium chloride SA (K-DUR,KLOR-CON) 20 MEQ tablet Take 1 tablet (20 mEq total) by mouth daily.  Marland Kitchen. venlafaxine XR (EFFEXOR-XR) 150 MG 24 hr capsule Take 1 capsule (150 mg total) by mouth daily with breakfast. Please make an appointment for more refills  . venlafaxine XR (EFFEXOR-XR) 75 MG 24 hr capsule TAKE 1 CAPSULE EVERY DAY WITH BREAKFAST  . VOLTAREN 1 % GEL   . [DISCONTINUED] HYDROcodone-acetaminophen (NORCO) 10-325 MG tablet Take by mouth.  . [DISCONTINUED] sulfamethoxazole-trimethoprim (BACTRIM DS,SEPTRA DS)  800-160 MG tablet Take 1 tablet by mouth 2 (two) times daily. (Patient not taking: Reported on 11/02/2018)   No facility-administered encounter medications on file as of 11/02/2018.     Activities of Daily Living In your present state of health, do you have any difficulty performing the following activities: 11/02/2018  Hearing? N  Comment no hearing aids  Vision? N  Comment eyeglasses, goes to [facility] for eye exams.  Difficulty concentrating or making decisions? N  Walking or climbing stairs? N  Dressing or bathing? N  Doing errands, shopping? N  Preparing Food and eating ? N  Using the Toilet? N  In the past six months, have you accidently leaked urine? N  Do you have problems with loss of bowel control? N  Managing your Medications? N  Managing your Finances? N  Housekeeping or managing your Housekeeping? N  Some recent data might be hidden    Patient Care Team: Dorcas Carrow, DO as PCP - General (Family Medicine) Wyn Quaker, Marlow Baars, MD as Referring Physician (Vascular Surgery) Marcille Blanco, MD as Referring Physician (Surgery) Brayton El, MD as Referring Physician (Rheumatology) Gustavus Bryant, LCSW as Social Worker (Licensed Clinical Social Worker)    Assessment:   This is a routine wellness examination for Taletha.  Exercise Activities and Dietary  recommendations Current Exercise Habits: Home exercise routine, Type of exercise: walking, Time (Minutes): 30, Frequency (Times/Week): 3, Weekly Exercise (Minutes/Week): 90, Intensity: Mild, Exercise limited by: None identified  Goals    . DIET - INCREASE WATER INTAKE     Recommend continue drinking at least 6-8 glasses of water a day        Fall Risk: Fall Risk  11/02/2018 08/22/2017 08/20/2016 05/18/2016 04/19/2016  Falls in the past year? 0 No No No No    FALL RISK PREVENTION PERTAINING TO THE HOME:  Any stairs in or around the home? No  If so, are there any without handrails? No   Home free of loose throw rugs in walkways, pet beds, electrical cords, etc? Yes  Adequate lighting in your home to reduce risk of falls? Yes   ASSISTIVE DEVICES UTILIZED TO PREVENT FALLS:  Life alert? No  Use of a cane, walker or w/c? No  Grab bars in the bathroom? No  Shower chair or bench in shower? No  Elevated toilet seat or a handicapped toilet? No   DME ORDERS:  DME order needed?  No   TIMED UP AND GO:  Unable to perform   Depression Screen PHQ 2/9 Scores 11/02/2018 08/24/2017 08/22/2017 05/09/2017  PHQ - 2 Score 0 3 5 0  PHQ- 9 Score - 8 6 3      Cognitive Function     6CIT Screen 08/22/2017 08/20/2016  What Year? 0 points 0 points  What month? 0 points 0 points  What time? 0 points 0 points  Count back from 20 0 points 0 points  Months in reverse 0 points 0 points  Repeat phrase 0 points 4 points  Total Score 0 4    Immunization History  Administered Date(s) Administered  . Influenza Inj Mdck Quad Pf 12/13/2017  . Influenza,inj,Quad PF,6+ Mos 12/10/2016  . Influenza-Unspecified 12/18/2013, 12/10/2016  . Pneumococcal Conjugate-13 05/04/2017  . Pneumococcal Polysaccharide-23 02/24/2010  . Tdap 08/22/2015    Qualifies for Shingles Vaccine? Yes  Zostavax completed n/a. Due for Shingrix. Education has been provided regarding the importance of this vaccine. Pt has been advised to  call insurance company to determine out of  pocket expense. Advised may also receive vaccine at local pharmacy or Health Dept. Verbalized acceptance and understanding.  Tdap: up to date   Flu Vaccine: Due now   Pneumococcal Vaccine: not indicated   Screening Tests Health Maintenance  Topic Date Due  . MAMMOGRAM  10/04/2011  . INFLUENZA VACCINE  10/07/2018  . COLONOSCOPY  07/16/2021  . PAP SMEAR-Modifier  08/20/2021  . TETANUS/TDAP  08/21/2025  . Hepatitis C Screening  Completed  . HIV Screening  Completed    Cancer Screenings:  Colorectal Screening: Completed 07/16/2016. Repeat every 5 years  Mammogram: ordered, needs help scheduling. - has had previous mammogram in [place] over 3 years ago. Schedule after 2pm.   Bone Density: not indicated .  Lung Cancer Screening: (Low Dose CT Chest recommended if Age 21-80 years, 30 pack-year currently smoking OR have quit w/in 15years.) does not qualify.    Additional Screening:  Hepatitis C Screening: does qualify; Completed 08/24/2017  Vision Screening: Recommended annual ophthalmology exams for early detection of glaucoma and other disorders of the eye. Is the patient up to date with their annual eye exam?  Yes  Who is the provider or what is the name of the office in which the pt attends annual eye exams? [facility]   Dental Screening: Recommended annual dental exams for proper oral hygiene  Community Resource Referral:  CRR required this visit?  Yes, patient requests assistance in scheduling mammogram- hasn't had one done yet because she always forgets to schedule it.      Plan:  I have personally reviewed and addressed the Medicare Annual Wellness questionnaire and have noted the following in the patient's chart:  A. Medical and social history B. Use of alcohol, tobacco or illicit drugs  C. Current medications and supplements D. Functional ability and status E.  Nutritional status F.  Physical activity G. Advance  directives H. List of other physicians I.  Hospitalizations, surgeries, and ER visits in previous 12 months J.  [place] such as hearing and vision if needed, cognitive and depression L. Referrals and appointments   In addition, I have reviewed and discussed with patient certain preventive protocols, quality metrics, and best practice recommendations. A written personalized care plan for preventive services as well as general preventive health recommendations were provided to patient.  Signed,    Bevelyn Ngo, LPN  03/08/7508 Nurse Health Advisor   Nurse Notes: notes

## 2018-11-02 NOTE — Addendum Note (Signed)
Addended by: Tyler Aas A on: 11/02/2018 12:32 PM   Modules accepted: Orders

## 2018-11-22 ENCOUNTER — Other Ambulatory Visit: Payer: Self-pay | Admitting: Family Medicine

## 2018-11-22 MED ORDER — APIXABAN 5 MG PO TABS: 5.0000 mg | ORAL_TABLET | Freq: Two times a day (BID) | ORAL | 1 refills | Status: DC

## 2018-11-22 NOTE — Telephone Encounter (Signed)
Copied from [place] [id number]. Topic: Quick Communication - Rx Refill/Question >> Nov 22, 2018  2:50 PM Mcneil, Ja-Kwan wrote: Medication: apixaban (ELIQUIS) 5 MG TABS tablet  Has the patient contacted their pharmacy? yes   Preferred Pharmacy (with phone number or street name): [id number] - [address], [address] [place] [phone] (Phone)  [phone] (Fax)  Agent: Please be advised that RX refills may take up to 3 business days. We ask that you follow-up with your pharmacy.

## 2018-11-24 NOTE — Telephone Encounter (Signed)
Patient is calling to check on the status medication refill. Please advise.

## 2018-11-27 MED ORDER — APIXABAN 5 MG PO TABS: 5.0000 mg | ORAL_TABLET | Freq: Two times a day (BID) | ORAL | 1 refills | Status: DC

## 2018-11-27 NOTE — Addendum Note (Signed)
Addended by: Jefferson Fuel on: 11/27/2018 11:37 AM   Modules accepted: Orders

## 2018-11-27 NOTE — Telephone Encounter (Signed)
Patient states that the pharmacy has not received a request for Eliquis. Can this be be resent to the pharmacy [place]?

## 2018-11-30 ENCOUNTER — Other Ambulatory Visit: Payer: Self-pay

## 2018-11-30 ENCOUNTER — Encounter: Payer: Self-pay | Admitting: Family Medicine

## 2018-11-30 ENCOUNTER — Ambulatory Visit (INDEPENDENT_AMBULATORY_CARE_PROVIDER_SITE_OTHER): Payer: Medicare HMO | Admitting: Family Medicine

## 2018-11-30 VITALS — BP 122/77 | HR 69 | Temp 98.5°F | Ht 62.0 in | Wt 272.4 lb

## 2018-11-30 DIAGNOSIS — M797 Fibromyalgia: Secondary | ICD-10-CM

## 2018-11-30 DIAGNOSIS — E063 Autoimmune thyroiditis: Secondary | ICD-10-CM

## 2018-11-30 DIAGNOSIS — F331 Major depressive disorder, recurrent, moderate: Secondary | ICD-10-CM

## 2018-11-30 DIAGNOSIS — D899 Disorder involving the immune mechanism, unspecified: Secondary | ICD-10-CM

## 2018-11-30 DIAGNOSIS — I2721 Secondary pulmonary arterial hypertension: Secondary | ICD-10-CM

## 2018-11-30 DIAGNOSIS — I209 Angina pectoris, unspecified: Secondary | ICD-10-CM | POA: Diagnosis not present

## 2018-11-30 DIAGNOSIS — G709 Myoneural disorder, unspecified: Secondary | ICD-10-CM | POA: Diagnosis not present

## 2018-11-30 DIAGNOSIS — I129 Hypertensive chronic kidney disease with stage 1 through stage 4 chronic kidney disease, or unspecified chronic kidney disease: Secondary | ICD-10-CM

## 2018-11-30 DIAGNOSIS — M329 Systemic lupus erythematosus, unspecified: Secondary | ICD-10-CM

## 2018-11-30 DIAGNOSIS — D849 Immunodeficiency, unspecified: Secondary | ICD-10-CM

## 2018-11-30 DIAGNOSIS — E559 Vitamin D deficiency, unspecified: Secondary | ICD-10-CM | POA: Diagnosis not present

## 2018-11-30 DIAGNOSIS — Z0000 Encounter for general adult medical examination without abnormal findings: Secondary | ICD-10-CM

## 2018-11-30 DIAGNOSIS — E782 Mixed hyperlipidemia: Secondary | ICD-10-CM | POA: Diagnosis not present

## 2018-11-30 DIAGNOSIS — R8271 Bacteriuria: Secondary | ICD-10-CM | POA: Diagnosis not present

## 2018-11-30 DIAGNOSIS — R799 Abnormal finding of blood chemistry, unspecified: Secondary | ICD-10-CM | POA: Diagnosis not present

## 2018-11-30 LAB — MICROSCOPIC EXAMINATION: RBC, Urine: NONE SEEN /hpf (ref 0–2)

## 2018-11-30 LAB — MICROALBUMIN, URINE WAIVED
Creatinine, Urine Waived: 100 mg/dL (ref 10–300)
Microalb, Ur Waived: 10 mg/L (ref 0–19)
Microalb/Creat Ratio: <30 mg/g (ref <30)

## 2018-11-30 LAB — UA/M W/RFLX CULTURE, ROUTINE
Bilirubin, UA: NEGATIVE
Glucose, UA: NEGATIVE
Ketones, UA: NEGATIVE
Nitrite, UA: NEGATIVE
Protein,UA: NEGATIVE
RBC, UA: NEGATIVE
Specific Gravity, UA: 1.02 (ref 1.005–1.030)
Urobilinogen, Ur: 1 mg/dL (ref 0.2–1.0)
pH, UA: 5.5 (ref 5.0–7.5)

## 2018-11-30 LAB — BAYER DCA HB A1C WAIVED: HB A1C (BAYER DCA - WAIVED): 5.1 % (ref <7.0)

## 2018-11-30 MED ORDER — POTASSIUM CHLORIDE CRYS ER 20 MEQ PO TBCR: 20.0000 meq | EXTENDED_RELEASE_TABLET | Freq: Every day | ORAL | 1 refills | Status: DC

## 2018-11-30 MED ORDER — VENLAFAXINE HCL ER 150 MG PO CP24: 150.0000 mg | ORAL_CAPSULE | Freq: Every day | ORAL | 1 refills | Status: DC

## 2018-11-30 MED ORDER — VENLAFAXINE HCL ER 75 MG PO CP24: ORAL_CAPSULE | ORAL | 1 refills | Status: DC

## 2018-11-30 MED ORDER — ALBUTEROL SULFATE HFA 108 (90 BASE) MCG/ACT IN AERS: 2.0000 | INHALATION_SPRAY | Freq: Four times a day (QID) | RESPIRATORY_TRACT | 6 refills | Status: DC | PRN

## 2018-11-30 MED ORDER — MONTELUKAST SODIUM 10 MG PO TABS: ORAL_TABLET | ORAL | 1 refills | Status: DC

## 2018-11-30 MED ORDER — BACLOFEN 10 MG PO TABS: 10.0000 mg | ORAL_TABLET | Freq: Three times a day (TID) | ORAL | 1 refills | Status: DC

## 2018-11-30 MED ORDER — HYDROXYZINE HCL 25 MG PO TABS: 25.0000 mg | ORAL_TABLET | Freq: Every evening | ORAL | 1 refills | Status: DC | PRN

## 2018-11-30 MED ORDER — GABAPENTIN 300 MG PO CAPS: 600.0000 mg | ORAL_CAPSULE | Freq: Two times a day (BID) | ORAL | 1 refills | Status: DC

## 2018-11-30 MED ORDER — AMLODIPINE BESYLATE 10 MG PO TABS: 10.0000 mg | ORAL_TABLET | Freq: Every day | ORAL | 1 refills | Status: DC

## 2018-11-30 MED ORDER — FUROSEMIDE 40 MG PO TABS: 80.0000 mg | ORAL_TABLET | Freq: Two times a day (BID) | ORAL | 1 refills | Status: DC

## 2018-11-30 MED ORDER — PANTOPRAZOLE SODIUM 40 MG PO TBEC: 40.0000 mg | DELAYED_RELEASE_TABLET | Freq: Every day | ORAL | 1 refills | Status: DC

## 2018-11-30 NOTE — Assessment & Plan Note (Signed)
Continue to follow with cardiology and pulmonology. Call with any concerns.  

## 2018-11-30 NOTE — Assessment & Plan Note (Signed)
Under good control on current regimen. Continue current regimen. Continue to monitor. Call with any concerns. Refills given. Labs drawn today.   

## 2018-11-30 NOTE — Assessment & Plan Note (Signed)
Continue to follow with rheumatology. Call with any concerns. Stable. Continue to monitor.

## 2018-11-30 NOTE — Assessment & Plan Note (Signed)
Continue to follow with cardiology and pulmonology. Call with any concerns.

## 2018-11-30 NOTE — Assessment & Plan Note (Signed)
Labs drawn today. Await results. We will treat as needed.

## 2018-11-30 NOTE — Assessment & Plan Note (Signed)
Continue to follow with rheumatology. Call with any concerns.  

## 2018-11-30 NOTE — Assessment & Plan Note (Signed)
Continue to follow with rheumatology. Call with any concerns. Stable. Continue to monitor.  

## 2018-11-30 NOTE — Assessment & Plan Note (Signed)
Labs drawn today. Will treat as needed. Call with any concerns.  

## 2018-11-30 NOTE — Progress Notes (Signed)
BP 122/77   Pulse 69   Temp 98.5 F (36.9 C) (Oral)   Ht  (1.575 m)   Wt 272 lb 6.4 oz (123.6 kg)   SpO2 99%   BMI 49.82 kg/m    Subjective:    Patient ID: Kaylee Fox, female    DOB: September 30, 1960, 58 y.o.   MRN: [id number]  HPI: Kaylee Fox is a 58 y.o. female presenting on 11/30/2018 for comprehensive medical examination. Current medical complaints include:  HYPERTENSION / HYPERLIPIDEMIA Satisfied with current treatment? yes Duration of hypertension: chronic BP monitoring frequency: not checking BP medication side effects: no Past BP meds: amlodipine, lasix Duration of hyperlipidemia: chronic Cholesterol medication side effects: no Cholesterol supplements: none Past cholesterol medications: none Medication compliance: excellent compliance Aspirin: no Recent stressors: no Recurrent headaches: no Visual changes: no Palpitations: no Dyspnea: no Chest pain: no Lower extremity edema: no Dizzy/lightheaded: no  DEPRESSION Mood status: controlled Satisfied with current treatment?: yes Symptom severity: mild  Duration of current treatment : chronic Side effects: no Medication compliance: excellent compliance Psychotherapy/counseling: no  Previous psychiatric medications: effexor Depressed mood: no Anxious mood: no Anhedonia: no Significant weight loss or gain: no Insomnia: no  Fatigue: yes Feelings of worthlessness or guilt: no Impaired concentration/indecisiveness: no Suicidal ideations: no Hopelessness: no Crying spells: no Depression screen [facility] 2/9 11/30/2018 11/02/2018 08/24/2017 08/22/2017 05/09/2017  Decreased Interest 2 0 0 3 0  Down, Depressed, Hopeless 0 0 3 2 0  PHQ - 2 Score 2 0 3 5 0  Altered sleeping 1 - 1 0 2  Tired, decreased energy 1 - Change in appetite 0 - 0 0 0  Feeling bad or failure about yourself  0 - 3 0 0  Trouble concentrating 0 - 0 0 0  Moving slowly or fidgety/restless 0 - 0 0 0  Suicidal thoughts 0 - 0 0 0  PHQ-9 Score  4 - Difficult doing work/chores Not difficult at all - Not difficult at all Not difficult at all Not difficult at all   Menopausal Symptoms: yes  Depression Screen done today and results listed below:  Depression screen [facility] 2/9 11/30/2018 11/02/2018 08/24/2017 08/22/2017 05/09/2017  Decreased Interest 2 0 0 3 0  Down, Depressed, Hopeless 0 0 3 2 0  PHQ - 2 Score 2 0 3 5 0  Altered sleeping 1 - 1 0 2  Tired, decreased energy 1 - Change in appetite 0 - 0 0 0  Feeling bad or failure about yourself  0 - 3 0 0  Trouble concentrating 0 - 0 0 0  Moving slowly or fidgety/restless 0 - 0 0 0  Suicidal thoughts 0 - 0 0 0  PHQ-9 Score 4 - Difficult doing work/chores Not difficult at all - Not difficult at all Not difficult at all Not difficult at all    Past Medical History:  Past Medical History:  Diagnosis Date  . Anticoagulant long-term use   . Anxiety   . Autoimmune hypothyroidism   . Chronic hypokalemia   . Chronic pain   . Chronic, continuous use of opioids   . Depression   . Diverticulosis   . Fatigue   . Fibromyalgia   . Herpes labialis   . Herpes simplex   . History of pulmonary embolus (PE)   . History of pulmonary hypertension   . Hypertension   . Lupus (systemic lupus erythematosus) (HCC)   .  Neuromuscular disorder (HCC)   . OSA on CPAP   . Osteoporosis   . Venous ulcers of both lower extremities (HCC) 06/17/2016  . Vitamin D deficiency     Surgical History:  Past Surgical History:  Procedure Laterality Date  . CESAREAN SECTION    . CHOLECYSTECTOMY    . COLONOSCOPY WITH PROPOFOL N/A 07/16/2016   Procedure: COLONOSCOPY WITH PROPOFOL;  Surgeon: Wyline MoodAnna, Kiran, MD;  [facility];  Service: Endoscopy;  Laterality: N/A;  . LAPAROSCOPIC GASTRIC SLEEVE RESECTION      Medications:  Current Outpatient Medications on File Prior to Visit  Medication Sig  . acetaminophen (TYLENOL) 325 MG tablet Take by mouth.  . ALPRAZolam (XANAX) 0.5 MG tablet  at bedtime as needed.   Marland Kitchen. apixaban (ELIQUIS) 5 MG TABS tablet Take 1 tablet (5 mg total) by mouth 2 (two) times daily.  Marland Kitchen. azaTHIOprine (IMURAN) 50 MG tablet Take 100 mg by mouth daily.  . calcium carbonate (CALCIUM 600) 600 MG TABS tablet Take by mouth.  . cetirizine (ZYRTEC) 10 MG tablet Take 1 tablet (10 mg total) by mouth daily.  . Cholecalciferol (D3-1000) 1000 units capsule Take 1,000 Units by mouth daily. Takes 2 1000 mg per day  . fluticasone (FLONASE) 50 MCG/ACT nasal spray Place 2 sprays into both nostrils daily.  . hydroxychloroquine (PLAQUENIL) 200 MG tablet Take 200 mg by mouth. Take 2 200 mg tabs a day  . levothyroxine (SYNTHROID, LEVOTHROID) 75 MCG tablet Take 1 tablet (75 mcg total) by mouth daily before breakfast.  . Multiple Vitamin (MULTI-VITAMINS) TABS Take by mouth.  . mupirocin cream (BACTROBAN) 2 % Apply to affected area on arms/legs twice a day as needed.  . VOLTAREN 1 % GEL    No current facility-administered medications on file prior to visit.     Allergies:  Allergies  Allergen Reactions  . Penicillins Rash  . Clindamycin Nausea Only  . Ciprofloxacin Other (See Comments)  . Morphine Other (See Comments)    Social History:  Social History   Socioeconomic History  . Marital status: Legally Separated    Spouse name: Not on file  . Number of children: Not on file  . Years of education: Not on file  . Highest education level: High school graduate  Occupational History  . Occupation: disability   Social Needs  . Financial resource strain: Not hard at all  . Food insecurity    Worry: Never true    Inability: Never true  . Transportation needs    Medical: No    Non-medical: No  Tobacco Use  . Smoking status: Never Smoker  . Smokeless tobacco: Never Used  Substance and Sexual Activity  . Alcohol use: No  . Drug use: No  . Sexual activity: Yes    Birth control/protection: Post-menopausal  Lifestyle  . Physical activity    Days per week: 3 days     Minutes per session: 30 min  . Stress: Not at all  Relationships  . Social connections    Talks on phone: More than three times a week    Gets together: More than three times a week    Attends religious service: More than 4 times per year    Active member of club or organization: No    Attends meetings of clubs or organizations: Never    Relationship status: Separated  . Intimate partner violence    Fear of current or ex partner: No    Emotionally abused: No  Physically abused: No    Forced sexual activity: No  Other Topics Concern  . Not on file  Social History Narrative  . Not on file   Social History   Tobacco Use  Smoking Status Never Smoker  Smokeless Tobacco Never Used   Social History   Substance and Sexual Activity  Alcohol Use No    Family History:  Family History  Problem Relation Age of Onset  . Arthritis Mother   . Osteoarthritis Mother   . Lupus Mother   . Hypothyroidism Mother   . Osteoarthritis Father   . Emphysema Father   . Coronary artery disease Father     Past medical history, surgical history, medications, allergies, family history and social history reviewed with patient today and changes made to appropriate areas of the chart.   Review of Systems  Constitutional: Negative.   HENT: Positive for ear pain. Negative for congestion, ear discharge, hearing loss, nosebleeds, sinus pain, sore throat and tinnitus.   Eyes: Negative.   Respiratory: Negative.  Negative for stridor.   Cardiovascular: Negative.   Gastrointestinal: Negative.   Genitourinary: Negative.   Musculoskeletal: Negative.   Skin: Negative.   Neurological: Negative.   Endo/Heme/Allergies: Negative.   Psychiatric/Behavioral: Negative.     All other ROS negative except what is listed above and in the HPI.      Objective:    BP 122/77   Pulse 69   Temp 98.5 F (36.9 C) (Oral)   Ht 5\' 2"  (1.575 m)   Wt 272 lb 6.4 oz (123.6 kg)   SpO2 99%   BMI 49.82 kg/m   Wt  Readings from Last 3 Encounters:  11/30/18 272 lb 6.4 oz (123.6 kg)  04/20/18 250 lb (113.4 kg)  01/02/18 240 lb 12.8 oz (109.2 kg)    Physical Exam Vitals signs and nursing note reviewed.  Constitutional:      General: She is not in acute distress.    Appearance: Normal appearance. She is obese. She is not ill-appearing, toxic-appearing or diaphoretic.  HENT:     Head: Normocephalic and atraumatic.     Right Ear: Tympanic membrane, ear canal and external ear normal. There is no impacted cerumen.     Left Ear: Tympanic membrane, ear canal and external ear normal. There is no impacted cerumen.     Nose: Nose normal. No congestion or rhinorrhea.     Mouth/Throat:     Mouth: Mucous membranes are moist.     Pharynx: Oropharynx is clear. No oropharyngeal exudate or posterior oropharyngeal erythema.  Eyes:     General: No scleral icterus.       Right eye: No discharge.        Left eye: No discharge.     Extraocular Movements: Extraocular movements intact.     Conjunctiva/sclera: Conjunctivae normal.     Pupils: Pupils are equal, round, and reactive to light.  Neck:     Musculoskeletal: Normal range of motion and neck supple. No neck rigidity or muscular tenderness.     Vascular: No carotid bruit.  Cardiovascular:     Rate and Rhythm: Normal rate and regular rhythm.     Pulses: Normal pulses.     Heart sounds: No murmur. No friction rub. No gallop.   Pulmonary:     Effort: Pulmonary effort is normal. No respiratory distress.     Breath sounds: Normal breath sounds. No stridor. No wheezing, rhonchi or rales.  Chest:     Chest wall: No tenderness.  Abdominal:     General: Abdomen is flat. Bowel sounds are normal. There is no distension.     Palpations: Abdomen is soft. There is no mass.     Tenderness: There is no abdominal tenderness. There is no right CVA tenderness, left CVA tenderness, guarding or rebound.     Hernia: No hernia is present.  Genitourinary:    Comments: Breast and  pelvic exams deferred with shared decision making Musculoskeletal:        General: No swelling, tenderness, deformity or signs of injury.     Right lower leg: No edema.     Left lower leg: No edema.  Lymphadenopathy:     Cervical: No cervical adenopathy.  Skin:    General: Skin is warm and dry.     Capillary Refill: Capillary refill takes less than 2 seconds.     Coloration: Skin is not jaundiced or pale.     Findings: No bruising, erythema, lesion or rash.  Neurological:     General: No focal deficit present.     Mental Status: She is alert and oriented to person, place, and time. Mental status is at baseline.     Cranial Nerves: No cranial nerve deficit.     Sensory: No sensory deficit.     Motor: No weakness.     Coordination: Coordination normal.     Gait: Gait normal.     Deep Tendon Reflexes: Reflexes normal.  Psychiatric:        Mood and Affect: Mood normal.        Behavior: Behavior normal.        Thought Content: Thought content normal.        Judgment: Judgment normal.     Results for orders placed or performed in visit on 04/20/18  Microscopic Examination   URINE  Result Value Ref Range   WBC, UA 0-5 0 - 5 /hpf   RBC, UA None seen 0 - 2 /hpf   Epithelial Cells (non renal) 0-10 0 - 10 /hpf   Bacteria, UA Few None seen/Few  CBC with Differential/Platelet  Result Value Ref Range   WBC 5.8 3.4 - 10.8 x10E3/uL   RBC 3.80 3.77 - 5.28 x10E6/uL   Hemoglobin 12.9 11.1 - 15.9 g/dL   Hematocrit 37.1 34.0 - 46.6 %   MCV 98 (H) 79 - 97 fL   MCH 33.9 (H) 26.6 - 33.0 pg   MCHC 34.8 31.5 - 35.7 g/dL   RDW 12.5 11.7 - 15.4 %   Platelets 290 150 - 450 x10E3/uL   Neutrophils 58 Not Estab. %   Lymphs 32 Not Estab. %   Monocytes 8 Not Estab. %   Eos 1 Not Estab. %   Basos 1 Not Estab. %   Neutrophils Absolute 3.4 1.4 - 7.0 x10E3/uL   Lymphocytes Absolute 1.9 0.7 - 3.1 x10E3/uL   Monocytes Absolute 0.5 0.1 - 0.9 x10E3/uL   EOS (ABSOLUTE) 0.1 0.0 - 0.4 x10E3/uL    Basophils Absolute 0.0 0.0 - 0.2 x10E3/uL   Immature Granulocytes 0 Not Estab. %   Immature Grans (Abs) 0.0 0.0 - 0.1 x10E3/uL  Comprehensive metabolic panel  Result Value Ref Range   Glucose 100 (H) 65 - 99 mg/dL   BUN 19 6 - 24 mg/dL   Creatinine, Ser 0.72 0.57 - 1.00 mg/dL   GFR calc non Af Amer 94 >59 mL/min/1.73   GFR calc Af Amer 108 >59 mL/min/1.73   BUN/Creatinine Ratio 26 (H) 9 - 23  Sodium 144 134 - 144 mmol/L   Potassium 4.0 3.5 - 5.2 mmol/L   Chloride 106 96 - 106 mmol/L   CO2 19 (L) 20 - 29 mmol/L   Calcium 8.9 8.7 - 10.2 mg/dL   Total Protein 6.8 6.0 - 8.5 g/dL   Albumin 4.2 3.8 - 4.9 g/dL   Globulin, Total 2.6 1.5 - 4.5 g/dL   Albumin/Globulin Ratio 1.6 1.2 - 2.2   Bilirubin Total 0.3 0.0 - 1.2 mg/dL   Alkaline Phosphatase 82 39 - 117 IU/L   AST 26 0 - 40 IU/L   ALT 17 0 - 32 IU/L  Lipid Panel w/o Chol/HDL Ratio  Result Value Ref Range   Cholesterol, Total 229 (H) 100 - 199 mg/dL   Triglycerides 161 0 - 149 mg/dL   HDL 53 >09 mg/dL   VLDL Cholesterol Cal 26 5 - 40 mg/dL   LDL Calculated 604 (H) 0 - 99 mg/dL  TSH  Result Value Ref Range   TSH 2.190 0.450 - 4.500 uIU/mL  UA/M w/rflx Culture, Routine   Specimen: Urine   URINE  Result Value Ref Range   Specific Gravity, UA 1.020 1.005 - 1.030   pH, UA 7.0 5.0 - 7.5   Color, UA Yellow Yellow   Appearance Ur Clear Clear   Leukocytes, UA 1+ (A) Negative   Protein, UA Negative Negative/Trace   Glucose, UA Negative Negative   Ketones, UA Negative Negative   RBC, UA Negative Negative   Bilirubin, UA Negative Negative   Urobilinogen, Ur 1.0 0.2 - 1.0 mg/dL   Nitrite, UA Negative Negative   Microscopic Examination See below:   VITAMIN D 25 Hydroxy (Vit-D Deficiency, Fractures)  Result Value Ref Range   Vit D, 25-Hydroxy 32.8 30.0 - 100.0 ng/mL      Assessment & Plan:   Problem List Items Addressed This Visit      Cardiovascular and Mediastinum   Angina pectoris (HCC)    Continue to follow with  cardiology and pulmonology. Call with any concerns.       Relevant Medications   amLODipine (NORVASC) 10 MG tablet   furosemide (LASIX) 40 MG tablet   Pulmonary hypertensive arterial disease (HCC)    Continue to follow with cardiology and pulmonology. Call with any concerns.       Relevant Medications   amLODipine (NORVASC) 10 MG tablet   furosemide (LASIX) 40 MG tablet   Other Relevant Orders   Bayer DCA Hb A1c Waived   CBC with Differential/Platelet   Comprehensive metabolic panel   UA/M w/rflx Culture, Routine     Endocrine   Autoimmune hypothyroidism    Labs drawn today. Will treat as needed. Call with any concerns.       Relevant Orders   Bayer DCA Hb A1c Waived   CBC with Differential/Platelet   Comprehensive metabolic panel   TSH   UA/M w/rflx Culture, Routine     Nervous and Auditory   Neuromuscular disorder (HCC)    Continue to follow with rheumatology. Call with any concerns.       Relevant Medications   baclofen (LIORESAL) 10 MG tablet   gabapentin (NEURONTIN) 300 MG capsule   hydrOXYzine (ATARAX/VISTARIL) 25 MG tablet   venlafaxine XR (EFFEXOR-XR) 150 MG 24 hr capsule   venlafaxine XR (EFFEXOR-XR) 75 MG 24 hr capsule     Genitourinary   Benign hypertensive renal disease    Under good control on current regimen. Continue current regimen. Continue to monitor. Call with  any concerns. Refills given. Labs drawn today.       Relevant Orders   Bayer DCA Hb A1c Waived   CBC with Differential/Platelet   Comprehensive metabolic panel   Microalbumin, Urine Waived   UA/M w/rflx Culture, Routine     Other   Depression    Under good control on current regimen. Continue current regimen. Continue to monitor. Call with any concerns. Refills given. Labs drawn today.       Relevant Medications   hydrOXYzine (ATARAX/VISTARIL) 25 MG tablet   venlafaxine XR (EFFEXOR-XR) 150 MG 24 hr capsule   venlafaxine XR (EFFEXOR-XR) 75 MG 24 hr capsule   Other Relevant  Orders   Bayer DCA Hb A1c Waived   CBC with Differential/Platelet   Comprehensive metabolic panel   UA/M w/rflx Culture, Routine   Vitamin D deficiency    Labs drawn today. Await results. We will treat as needed.       Relevant Orders   Bayer DCA Hb A1c Waived   CBC with Differential/Platelet   Comprehensive metabolic panel   UA/M w/rflx Culture, Routine   VITAMIN D 25 Hydroxy (Vit-D Deficiency, Fractures)   Fibromyalgia    Under good control on current regimen. Continue current regimen. Continue to monitor. Call with any concerns. Refills given. Labs drawn today.       Relevant Medications   baclofen (LIORESAL) 10 MG tablet   gabapentin (NEURONTIN) 300 MG capsule   venlafaxine XR (EFFEXOR-XR) 150 MG 24 hr capsule   venlafaxine XR (EFFEXOR-XR) 75 MG 24 hr capsule   Other Relevant Orders   Bayer DCA Hb A1c Waived   CBC with Differential/Platelet   Comprehensive metabolic panel   UA/M w/rflx Culture, Routine   Lupus (systemic lupus erythematosus) (HCC)    Continue to follow with rheumatology. Call with any concerns. Stable. Continue to monitor.       Relevant Orders   Bayer DCA Hb A1c Waived   CBC with Differential/Platelet   Comprehensive metabolic panel   UA/M w/rflx Culture, Routine   Immunosuppression (HCC)    Continue to follow with rheumatology. Call with any concerns. Stable. Continue to monitor.       Relevant Orders   Bayer DCA Hb A1c Waived   CBC with Differential/Platelet   Comprehensive metabolic panel   UA/M w/rflx Culture, Routine   Mixed hyperlipidemia    Under good control on current regimen. Continue current regimen. Continue to monitor. Call with any concerns. Refills given. Labs drawn today.       Relevant Medications   amLODipine (NORVASC) 10 MG tablet   furosemide (LASIX) 40 MG tablet   Other Relevant Orders   Bayer DCA Hb A1c Waived   CBC with Differential/Platelet   Comprehensive metabolic panel   Lipid Panel w/o Chol/HDL Ratio   UA/M  w/rflx Culture, Routine    Other Visit Diagnoses    Routine general medical examination at a health care facility    -  Primary   Vaccines up to date. Screening labs checked today. Mammogram ordered. Colonoscopy and pap up to date. Continue diet and exercise. Call with any concerns.       Follow up plan: Return in about 6 months (around 05/30/2019).   LABORATORY TESTING:  - Pap smear: up to date  IMMUNIZATIONS:   - Tdap: Tetanus vaccination status reviewed: last tetanus booster within 10 years. - Influenza: Up to date - Pneumovax: Up to date - Prevnar: Up to date  SCREENING: -Mammogram: Ordered today  -  Colonoscopy: Up to date  - Bone Density: Not applicable   PATIENT COUNSELING:   Advised to take 1 mg of folate supplement per day if capable of pregnancy.   Sexuality: Discussed sexually transmitted diseases, partner selection, use of condoms, avoidance of unintended pregnancy  and contraceptive alternatives.   Advised to avoid cigarette smoking.  I discussed with the patient that most people either abstain from alcohol or drink within safe limits (<=14/week and <=4 drinks/occasion for males, <=7/weeks and <= 3 drinks/occasion for females) and that the risk for alcohol disorders and other health effects rises proportionally with the number of drinks per week and how often a drinker exceeds daily limits.  Discussed cessation/primary prevention of drug use and availability of treatment for abuse.   Diet: Encouraged to adjust caloric intake to maintain  or achieve ideal body weight, to reduce intake of dietary saturated fat and total fat, to limit sodium intake by avoiding high sodium foods and not adding table salt, and to maintain adequate dietary potassium and calcium preferably from fresh fruits, vegetables, and low-fat dairy products.    stressed the importance of regular exercise  Injury prevention: Discussed safety belts, safety helmets, smoke detector, smoking near bedding  or upholstery.   Dental health: Discussed importance of regular tooth brushing, flossing, and dental visits.    NEXT PREVENTATIVE PHYSICAL DUE IN 1 YEAR. Return in about 6 months (around 05/30/2019).

## 2018-12-01 ENCOUNTER — Encounter: Payer: Self-pay | Admitting: Family Medicine

## 2018-12-01 LAB — COMPREHENSIVE METABOLIC PANEL
ALT: 17 IU/L (ref 0–32)
AST: 25 IU/L (ref 0–40)
Albumin/Globulin Ratio: 1.5 (ref 1.2–2.2)
Albumin: 3.9 g/dL (ref 3.8–4.9)
Alkaline Phosphatase: 97 IU/L (ref 39–117)
BUN/Creatinine Ratio: 15 (ref 9–23)
BUN: 14 mg/dL (ref 6–24)
Bilirubin Total: 0.3 mg/dL (ref 0.0–1.2)
CO2: 27 mmol/L (ref 20–29)
Calcium: 9.4 mg/dL (ref 8.7–10.2)
Chloride: 104 mmol/L (ref 96–106)
Creatinine, Ser: 0.91 mg/dL (ref 0.57–1.00)
GFR calc Af Amer: 81 mL/min/{1.73_m2} (ref >59)
GFR calc non Af Amer: 70 mL/min/{1.73_m2} (ref >59)
Globulin, Total: 2.6 g/dL (ref 1.5–4.5)
Glucose: 95 mg/dL (ref 65–99)
Potassium: 4.5 mmol/L (ref 3.5–5.2)
Sodium: 141 mmol/L (ref 134–144)
Total Protein: 6.5 g/dL (ref 6.0–8.5)

## 2018-12-01 LAB — CBC WITH DIFFERENTIAL/PLATELET
Basophils Absolute: 0 10*3/uL (ref 0.0–0.2)
Basos: 0 %
EOS (ABSOLUTE): 0 10*3/uL (ref 0.0–0.4)
Eos: 0 %
Hematocrit: 38 % (ref 34.0–46.6)
Hemoglobin: 12.8 g/dL (ref 11.1–15.9)
Immature Grans (Abs): 0 10*3/uL (ref 0.0–0.1)
Immature Granulocytes: 0 %
Lymphocytes Absolute: 1.6 10*3/uL (ref 0.7–3.1)
Lymphs: 32 %
MCH: 33 pg (ref 26.6–33.0)
MCHC: 33.7 g/dL (ref 31.5–35.7)
MCV: 98 fL — ABNORMAL HIGH (ref 79–97)
Monocytes Absolute: 0.4 10*3/uL (ref 0.1–0.9)
Monocytes: 8 %
Neutrophils Absolute: 3 10*3/uL (ref 1.4–7.0)
Neutrophils: 60 %
Platelets: 266 10*3/uL (ref 150–450)
RBC: 3.88 x10E6/uL (ref 3.77–5.28)
RDW: 12.1 % (ref 11.7–15.4)
WBC: 5 10*3/uL (ref 3.4–10.8)

## 2018-12-01 LAB — VITAMIN D 25 HYDROXY (VIT D DEFICIENCY, FRACTURES): Vit D, 25-Hydroxy: 30.9 ng/mL (ref 30.0–100.0)

## 2018-12-01 LAB — LIPID PANEL W/O CHOL/HDL RATIO
Cholesterol, Total: 233 mg/dL — ABNORMAL HIGH (ref 100–199)
HDL: 54 mg/dL (ref >39)
LDL Chol Calc (NIH): 162 mg/dL — ABNORMAL HIGH (ref 0–99)
Triglycerides: 97 mg/dL (ref 0–149)
VLDL Cholesterol Cal: 17 mg/dL (ref 5–40)

## 2018-12-01 LAB — TSH: TSH: 2.04 u[IU]/mL (ref 0.450–4.500)

## 2018-12-27 ENCOUNTER — Ambulatory Visit (INDEPENDENT_AMBULATORY_CARE_PROVIDER_SITE_OTHER): Payer: Medicare HMO | Admitting: Family Medicine

## 2018-12-27 ENCOUNTER — Other Ambulatory Visit: Payer: Self-pay

## 2018-12-27 ENCOUNTER — Encounter: Payer: Self-pay | Admitting: Family Medicine

## 2018-12-27 VITALS — BP 122/70 | HR 69 | Temp 98.6°F

## 2018-12-27 DIAGNOSIS — M545 Low back pain, unspecified: Secondary | ICD-10-CM

## 2018-12-27 DIAGNOSIS — R8281 Pyuria: Secondary | ICD-10-CM | POA: Diagnosis not present

## 2018-12-27 NOTE — Progress Notes (Signed)
BP 122/70   Pulse 69   Temp 98.6 F (37 C) (Oral)   SpO2 98%    Subjective:    Patient ID: Kaylee Fox, female    DOB: 05-09-1960, 58 y.o.   MRN: [id number]  HPI: Kaylee Fox is a 58 y.o. female  Chief Complaint  Patient presents with  . Back Pain    right side low back pain that started Friday, no known injuries   6 days of right sided low back pain that wraps around to her side. Sharp shooting pain, worse with movement. Happens randomely throughout the day. No known injury, abdominal pain, dysuria, hematuria, hx of kidney stones, fever, N/V/D, constipation. Has known hx of fibromyalgia for which she takes gabapentin and prn baclofen, notes these have not been helping much. Has voltaren gel at home but has not used much for this.   Relevant past medical, surgical, family and social history reviewed and updated as indicated. Interim medical history since our last visit reviewed. Allergies and medications reviewed and updated.  Review of Systems  Per HPI unless specifically indicated above     Objective:    BP 122/70   Pulse 69   Temp 98.6 F (37 C) (Oral)   SpO2 98%   Wt Readings from Last 3 Encounters:  11/30/18 272 lb 6.4 oz (123.6 kg)  04/20/18 250 lb (113.4 kg)  01/02/18 240 lb 12.8 oz (109.2 kg)    Physical Exam Vitals signs and nursing note reviewed.  Constitutional:      Appearance: Normal appearance. She is not ill-appearing.  HENT:     Head: Atraumatic.  Eyes:     Extraocular Movements: Extraocular movements intact.     Conjunctiva/sclera: Conjunctivae normal.  Neck:     Musculoskeletal: Normal range of motion and neck supple.  Cardiovascular:     Rate and Rhythm: Normal rate and regular rhythm.     Heart sounds: Normal heart sounds.  Pulmonary:     Effort: Pulmonary effort is normal.     Breath sounds: Normal breath sounds.  Abdominal:     General: Bowel sounds are normal. There is no distension.     Palpations: Abdomen is soft.   Tenderness: There is no abdominal tenderness. There is no right CVA tenderness, left CVA tenderness or guarding.  Musculoskeletal: Normal range of motion.        General: Tenderness (Mild ttp right paraspinal muscles in lumbar region) present. No swelling or deformity.  Skin:    General: Skin is warm and dry.     Findings: No erythema.  Neurological:     Mental Status: She is alert and oriented to person, place, and time.  Psychiatric:        Mood and Affect: Mood normal.        Thought Content: Thought content normal.        Judgment: Judgment normal.     Results for orders placed or performed in visit on 12/27/18  Microscopic Examination   URINE  Result Value Ref Range   WBC, UA 0-5 0 - 5 /hpf   RBC None seen 0 - 2 /hpf   Epithelial Cells (non renal) 0-10 0 - 10 /hpf   Bacteria, UA None seen None seen/Few  Urine Culture, Reflex   URINE  Result Value Ref Range   Urine Culture, Routine Final report    Organism ID, Bacteria Comment   UA/M w/rflx Culture, Routine   Specimen: Urine   URINE  Result Value Ref  Range   Specific Gravity, UA 1.010 1.005 - 1.030   pH, UA 7.0 5.0 - 7.5   Color, UA Yellow Yellow   Appearance Ur Clear Clear   Leukocytes,UA 1+ (A) Negative   Protein,UA Negative Negative/Trace   Glucose, UA Negative Negative   Ketones, UA Negative Negative   RBC, UA Negative Negative   Bilirubin, UA Negative Negative   Urobilinogen, Ur 0.2 0.2 - 1.0 mg/dL   Nitrite, UA Negative Negative   Microscopic Examination See below:    Urinalysis Reflex Comment       Assessment & Plan:   Problem List Items Addressed This Visit    None    Visit Diagnoses    Acute right-sided low back pain, unspecified whether sciatica present    -  Primary   U/A benign, exam fairly benign. Suspect muscular etiology. Continue baclofen, voltaren. Heat, stretches, lidocaine patches recommended. F/u if not improving   Relevant Orders   UA/M w/rflx Culture, Routine (Completed)        Follow up plan: Return if symptoms worsen or fail to improve.

## 2018-12-29 LAB — URINE CULTURE, REFLEX

## 2018-12-29 LAB — UA/M W/RFLX CULTURE, ROUTINE
Bilirubin, UA: NEGATIVE
Glucose, UA: NEGATIVE
Ketones, UA: NEGATIVE
Nitrite, UA: NEGATIVE
Protein,UA: NEGATIVE
RBC, UA: NEGATIVE
Specific Gravity, UA: 1.01 (ref 1.005–1.030)
Urobilinogen, Ur: 0.2 mg/dL (ref 0.2–1.0)
pH, UA: 7 (ref 5.0–7.5)

## 2018-12-29 LAB — MICROSCOPIC EXAMINATION
Bacteria, UA: NONE SEEN
RBC, Urine: NONE SEEN /hpf (ref 0–2)

## 2019-02-05 ENCOUNTER — Other Ambulatory Visit: Payer: Self-pay | Admitting: Family Medicine

## 2019-02-05 MED ORDER — LEVOTHYROXINE SODIUM 75 MCG PO TABS: 75.0000 ug | ORAL_TABLET | Freq: Every day | ORAL | 1 refills | Status: DC

## 2019-02-05 NOTE — Telephone Encounter (Signed)
Patient would like a refill on her levothyroxine (SYNTHROID, LEVOTHROID) 75 MCG tablet medication and have it sent to her preferred pharmacy [facility] on [address].

## 2019-02-06 ENCOUNTER — Ambulatory Visit (INDEPENDENT_AMBULATORY_CARE_PROVIDER_SITE_OTHER): Payer: Medicare HMO | Admitting: Family Medicine

## 2019-02-06 ENCOUNTER — Other Ambulatory Visit: Payer: Self-pay

## 2019-02-06 ENCOUNTER — Encounter: Payer: Self-pay | Admitting: Family Medicine

## 2019-02-06 VITALS — Ht 62.0 in | Wt 250.0 lb

## 2019-02-06 DIAGNOSIS — J0100 Acute maxillary sinusitis, unspecified: Secondary | ICD-10-CM | POA: Diagnosis not present

## 2019-02-06 DIAGNOSIS — R52 Pain, unspecified: Secondary | ICD-10-CM

## 2019-02-06 MED ORDER — DOXYCYCLINE HYCLATE 100 MG PO TABS: 100.0000 mg | ORAL_TABLET | Freq: Two times a day (BID) | ORAL | 0 refills | Status: DC

## 2019-02-06 NOTE — Progress Notes (Signed)
Ht 5\' 2"  (1.575 m)   Wt 250 lb (113.4 kg)   BMI 45.73 kg/m    Subjective:    Patient ID: Kaylee Fox, female    DOB: 09-27-1960, 58 y.o.   MRN: [id number]  HPI: Kaylee Fox is a 58 y.o. female  Chief Complaint  Patient presents with  . Sinusitis    pressure in the back of her eyes x the last 2 weeks  . Headache  . Ear Problem    clogged    . This visit was completed via WebEx due to the restrictions of the COVID-19 pandemic. All issues as above were discussed and addressed. Physical exam was done as above through visual confirmation on WebEx. If it was felt that the patient should be evaluated in the office, they were directed there. The patient verbally consented to this visit. . Location of the patient: home . Location of the provider: home . Those involved with this call:  . Provider: Merrie Roof, PA-C . CMA: Lesle Chris, [place] . Front Desk/Registration: Jill Side  . Time spent on call: 15 minutes with patient face to face via video conference. More than 50% of this time was spent in counseling and coordination of care. 5 minutes total spent in review of patient's record and preparation of their chart. I verified patient identity using two factors (patient name and date of birth). Patient consents verbally to being seen via telemedicine visit today.   Sinus pain and pressure, headaches, body aches, chills, sweats, subjective low grade fever, ears crackling and pressure, and sore throat x 2 weeks. Trying mucinex with no relief. Denies CP, SOB, N/V/D, known sick contacts, recent travel. Hx of frequent sinus infections and allergies.   Relevant past medical, surgical, family and social history reviewed and updated as indicated. Interim medical history since our last visit reviewed. Allergies and medications reviewed and updated.  Review of Systems  Per HPI unless specifically indicated above     Objective:    Ht 5\' 2"  (1.575 m)   Wt 250 lb (113.4 kg)   BMI 45.73  kg/m   Wt Readings from Last 3 Encounters:  02/06/19 250 lb (113.4 kg)  11/30/18 272 lb 6.4 oz (123.6 kg)  04/20/18 250 lb (113.4 kg)    Physical Exam Vitals signs and nursing note reviewed.  Constitutional:      General: She is not in acute distress.    Appearance: Normal appearance.  HENT:     Head: Atraumatic.     Right Ear: External ear normal.     Left Ear: External ear normal.     Nose: Congestion present.     Mouth/Throat:     Mouth: Mucous membranes are moist.     Pharynx: Oropharynx is clear. Posterior oropharyngeal erythema present.  Eyes:     Extraocular Movements: Extraocular movements intact.     Conjunctiva/sclera: Conjunctivae normal.  Neck:     Musculoskeletal: Normal range of motion.  Cardiovascular:     Comments: Unable to assess via virtual visit Pulmonary:     Effort: Pulmonary effort is normal. No respiratory distress.  Musculoskeletal: Normal range of motion.  Skin:    General: Skin is dry.     Findings: No erythema.  Neurological:     Mental Status: She is alert and oriented to person, place, and time.  Psychiatric:        Mood and Affect: Mood normal.        Thought Content: Thought content normal.  Judgment: Judgment normal.     Results for orders placed or performed in visit on 12/27/18  Microscopic Examination   URINE  Result Value Ref Range   WBC, UA 0-5 0 - 5 /hpf   RBC None seen 0 - 2 /hpf   Epithelial Cells (non renal) 0-10 0 - 10 /hpf   Bacteria, UA None seen None seen/Few  Urine Culture, Reflex   URINE  Result Value Ref Range   Urine Culture, Routine Final report    Organism ID, Bacteria Comment   UA/M w/rflx Culture, Routine   Specimen: Urine   URINE  Result Value Ref Range   Specific Gravity, UA 1.010 1.005 - 1.030   pH, UA 7.0 5.0 - 7.5   Color, UA Yellow Yellow   Appearance Ur Clear Clear   Leukocytes,UA 1+ (A) Negative   Protein,UA Negative Negative/Trace   Glucose, UA Negative Negative   Ketones, UA  Negative Negative   RBC, UA Negative Negative   Bilirubin, UA Negative Negative   Urobilinogen, Ur 0.2 0.2 - 1.0 mg/dL   Nitrite, UA Negative Negative   Microscopic Examination See below:    Urinalysis Reflex Comment       Assessment & Plan:   Problem List Items Addressed This Visit    None    Visit Diagnoses    Acute maxillary sinusitis, recurrence not specified    -  Primary   Tx with doxycycline, continued OTC supportive care. COVID testing ordered for r/o. Return precautions reviewed   Relevant Medications   doxycycline (VIBRA-TABS) 100 MG tablet   Other Relevant Orders   Novel Coronavirus, NAA ([facility])   Generalized body aches       Relevant Orders   Novel Coronavirus, NAA ([facility])       Follow up plan: Return if symptoms worsen or fail to improve.

## 2019-02-12 ENCOUNTER — Telehealth: Payer: Self-pay | Admitting: Family Medicine

## 2019-02-12 NOTE — Telephone Encounter (Signed)
° ° °  Called pt regarding Community Resource Referral for assistance scheduling a mammogram. [place] Management ??[email or web address]   ??[phone]

## 2019-02-15 NOTE — Telephone Encounter (Signed)
2nd attempt lmtcb

## 2019-02-16 NOTE — Telephone Encounter (Signed)
3rd attempt vm box full,  Closing referral pending any other needs of patient.  [place] . Embedded Care Coordination [place]  Care Management ??[email or web address]  ??[phone]

## 2019-02-24 ENCOUNTER — Other Ambulatory Visit: Payer: Self-pay | Admitting: Family Medicine

## 2019-03-26 ENCOUNTER — Telehealth: Payer: Self-pay | Admitting: Family Medicine

## 2019-03-26 DIAGNOSIS — M329 Systemic lupus erythematosus, unspecified: Secondary | ICD-10-CM

## 2019-03-26 NOTE — Telephone Encounter (Signed)
Copied from CRM [id number]. Topic: General - Other >> Mar 21, 2019  9:44 AM Jaquita Rector A wrote: Reason for CRM: Patient called to request a referral sent to her [facility] in [facility]. Please fax to fax# [phone] Per patient she does not know the doctors name but states that they need the referral to start the process of her getting an appointment. Any questions please call  Ph# [phone]

## 2019-04-04 DIAGNOSIS — H04123 Dry eye syndrome of bilateral lacrimal glands: Secondary | ICD-10-CM | POA: Diagnosis not present

## 2019-04-04 DIAGNOSIS — Z8679 Personal history of other diseases of the circulatory system: Secondary | ICD-10-CM | POA: Diagnosis not present

## 2019-04-04 DIAGNOSIS — M329 Systemic lupus erythematosus, unspecified: Secondary | ICD-10-CM | POA: Diagnosis not present

## 2019-04-04 DIAGNOSIS — E559 Vitamin D deficiency, unspecified: Secondary | ICD-10-CM | POA: Diagnosis not present

## 2019-04-04 DIAGNOSIS — Z79899 Other long term (current) drug therapy: Secondary | ICD-10-CM | POA: Diagnosis not present

## 2019-04-25 ENCOUNTER — Other Ambulatory Visit: Payer: Self-pay | Admitting: Family Medicine

## 2019-04-30 ENCOUNTER — Other Ambulatory Visit: Payer: Self-pay | Admitting: Family Medicine

## 2019-05-01 NOTE — Telephone Encounter (Signed)
Requested Prescriptions  Pending Prescriptions Disp Refills  . furosemide (LASIX) 40 MG tablet [Pharmacy Med Name: FUROSEMIDE 40 MG Tablet] 360 tablet 1    Sig: TAKE 2 TABLETS TWICE DAILY     Cardiovascular:  Diuretics - Loop Passed - 04/30/2019  7:19 PM      Passed - K in normal range and within 360 days    Potassium  Date Value Ref Range Status  11/30/2018 4.5 3.5 - 5.2 mmol/L Final         Passed - Ca in normal range and within 360 days    Calcium  Date Value Ref Range Status  11/30/2018 9.4 8.7 - 10.2 mg/dL Final         Passed - Na in normal range and within 360 days    Sodium  Date Value Ref Range Status  11/30/2018 141 134 - 144 mmol/L Final         Passed - Cr in normal range and within 360 days    Creatinine, Ser  Date Value Ref Range Status  11/30/2018 0.91 0.57 - 1.00 mg/dL Final         Passed - Last BP in normal range    BP Readings from Last 1 Encounters:  12/27/18 122/70         Passed - Valid encounter within last 6 months    Recent Outpatient Visits          2 months ago Acute maxillary sinusitis, recurrence not specified   [facility] Particia Nearing, PA-C   4 months ago Acute right-sided low back pain, unspecified whether sciatica present   [facility], Salley Hews, [place]   5 months ago Routine general medical examination at a health care facility   [facility], [place] P, DO   1 year ago Spontaneous hematoma of hand   [facility] [place], Megan P, DO   1 year ago Viral upper respiratory tract infection   [facility] [place], Oralia Rud, DO      Future Appointments            In 1 month Johnson, Oralia Rud, DO [facility], [facility]           . pantoprazole (PROTONIX) 40 MG tablet [Pharmacy Med Name: PANTOPRAZOLE SODIUM 40 MG Tablet Delayed Release] 90 tablet 1    Sig: TAKE 1 TABLET EVERY DAY     Gastroenterology: Proton Pump Inhibitors Passed -  04/30/2019  7:19 PM      Passed - Valid encounter within last 12 months    Recent Outpatient Visits          2 months ago Acute maxillary sinusitis, recurrence not specified   [facility] Particia Nearing, PA-C   4 months ago Acute right-sided low back pain, unspecified whether sciatica present   [facility], Salley Hews, [place]   5 months ago Routine general medical examination at a health care facility   [facility], [place] P, DO   1 year ago Spontaneous hematoma of hand   [facility] [place], Megan P, DO   1 year ago Viral upper respiratory tract infection   [facility] [place], Oralia Rud, DO      Future Appointments            In 1 month Johnson, Oralia Rud, DO [facility], [facility]           .  montelukast (SINGULAIR) 10 MG tablet [Pharmacy Med Name: MONTELUKAST SODIUM 10 MG Tablet] 90 tablet 1    Sig: TAKE 1 TABLET AT BEDTIME     Pulmonology:  Leukotriene Inhibitors Passed - 04/30/2019  7:19 PM      Passed - Valid encounter within last 12 months    Recent Outpatient Visits          2 months ago Acute maxillary sinusitis, recurrence not specified   [facility] Particia Nearing, PA-C   4 months ago Acute right-sided low back pain, unspecified whether sciatica present   [facility], Salley Hews, [place]   5 months ago Routine general medical examination at a health care facility   [facility], [place] P, DO   1 year ago Spontaneous hematoma of hand   [facility] [place], Megan P, DO   1 year ago Viral upper respiratory tract infection   [facility] [place], [place], DO      Future Appointments            In 1 month Johnson, Megan P, DO [facility], [facility]           . hydrOXYzine (ATARAX/VISTARIL) 25 MG tablet [Pharmacy Med Name: HYDROXYZINE HYDROCHLORIDE 25 MG Tablet] 90 tablet 1     Sig: TAKE 1 TABLET AT BEDTIME AS NEEDED.     Ear, Nose, and Throat:  Antihistamines Passed - 04/30/2019  7:19 PM      Passed - Valid encounter within last 12 months    Recent Outpatient Visits          2 months ago Acute maxillary sinusitis, recurrence not specified   [facility] Particia Nearing, [place]   4 months ago Acute right-sided low back pain, unspecified whether sciatica present   [facility] Particia Nearing, [place]   5 months ago Routine general medical examination at a health care facility   [facility], [place] P, DO   1 year ago Spontaneous hematoma of hand   [facility] [place], Megan P, DO   1 year ago Viral upper respiratory tract infection   [facility] Dorcas Carrow, DO      Future Appointments            In 1 month Johnson, Oralia Rud, DO [facility]           . venlafaxine XR (EFFEXOR-XR) 150 MG 24 hr capsule [Pharmacy Med Name: VENLAFAXINE HCL ER 150 MG Capsule Extended Release 24 Hour] 90 capsule 1    Sig: TAKE 1 CAPSULE DAILY WITH BREAKFAST     Psychiatry: Antidepressants - SNRI - desvenlafaxine & venlafaxine Failed - 04/30/2019  7:19 PM      Failed - LDL in normal range and within 360 days    LDL Chol Calc (NIH)  Date Value Ref Range Status  11/30/2018 162 (H) 0 - 99 mg/dL Final         Failed - Total Cholesterol in normal range and within 360 days    Cholesterol, Total  Date Value Ref Range Status  11/30/2018 233 (H) 100 - 199 mg/dL Final         Passed - Triglycerides in normal range and within 360 days    Triglycerides  Date Value Ref Range Status  11/30/2018 97 0 - 149 mg/dL Final         Passed - Completed PHQ-2 or PHQ-9 in  the last 360 days.      Passed - Last BP in normal range    BP Readings from Last 1 Encounters:  12/27/18 122/70         Passed - Valid encounter within last 6 months    Recent Outpatient Visits          2 months  ago Acute maxillary sinusitis, recurrence not specified   [facility] Volney American, [place]   4 months ago Acute right-sided low back pain, unspecified whether sciatica present   [facility] Volney American, [place]   5 months ago Routine general medical examination at a health care facility   [facility], [place], DO   1 year ago Spontaneous hematoma of hand   [place], [place] P, DO   1 year ago Viral upper respiratory tract infection   [facility] Valerie Roys, DO      Future Appointments            In 1 month Johnson, Barb Merino, DO [facility], [facility]

## 2019-05-11 DIAGNOSIS — M329 Systemic lupus erythematosus, unspecified: Secondary | ICD-10-CM | POA: Diagnosis not present

## 2019-05-11 DIAGNOSIS — Z79899 Other long term (current) drug therapy: Secondary | ICD-10-CM | POA: Diagnosis not present

## 2019-05-11 DIAGNOSIS — E559 Vitamin D deficiency, unspecified: Secondary | ICD-10-CM | POA: Diagnosis not present

## 2019-05-11 DIAGNOSIS — H35033 Hypertensive retinopathy, bilateral: Secondary | ICD-10-CM | POA: Diagnosis not present

## 2019-05-11 DIAGNOSIS — H43811 Vitreous degeneration, right eye: Secondary | ICD-10-CM | POA: Diagnosis not present

## 2019-05-11 DIAGNOSIS — H04123 Dry eye syndrome of bilateral lacrimal glands: Secondary | ICD-10-CM | POA: Diagnosis not present

## 2019-05-17 ENCOUNTER — Other Ambulatory Visit: Payer: Self-pay | Admitting: Family Medicine

## 2019-05-31 ENCOUNTER — Other Ambulatory Visit: Payer: Self-pay

## 2019-05-31 ENCOUNTER — Encounter: Payer: Self-pay | Admitting: Family Medicine

## 2019-05-31 ENCOUNTER — Ambulatory Visit (INDEPENDENT_AMBULATORY_CARE_PROVIDER_SITE_OTHER): Payer: Medicare HMO | Admitting: Family Medicine

## 2019-05-31 VITALS — BP 120/80 | HR 69 | Temp 98.3°F

## 2019-05-31 DIAGNOSIS — F331 Major depressive disorder, recurrent, moderate: Secondary | ICD-10-CM

## 2019-05-31 DIAGNOSIS — I2721 Secondary pulmonary arterial hypertension: Secondary | ICD-10-CM

## 2019-05-31 DIAGNOSIS — I209 Angina pectoris, unspecified: Secondary | ICD-10-CM

## 2019-05-31 DIAGNOSIS — E063 Autoimmune thyroiditis: Secondary | ICD-10-CM

## 2019-05-31 DIAGNOSIS — I129 Hypertensive chronic kidney disease with stage 1 through stage 4 chronic kidney disease, or unspecified chronic kidney disease: Secondary | ICD-10-CM

## 2019-05-31 DIAGNOSIS — E559 Vitamin D deficiency, unspecified: Secondary | ICD-10-CM | POA: Diagnosis not present

## 2019-05-31 DIAGNOSIS — Z6841 Body Mass Index (BMI) 40.0 and over, adult: Secondary | ICD-10-CM | POA: Diagnosis not present

## 2019-05-31 DIAGNOSIS — E782 Mixed hyperlipidemia: Secondary | ICD-10-CM

## 2019-05-31 DIAGNOSIS — F419 Anxiety disorder, unspecified: Secondary | ICD-10-CM | POA: Diagnosis not present

## 2019-05-31 DIAGNOSIS — D849 Immunodeficiency, unspecified: Secondary | ICD-10-CM

## 2019-05-31 DIAGNOSIS — M329 Systemic lupus erythematosus, unspecified: Secondary | ICD-10-CM | POA: Diagnosis not present

## 2019-05-31 DIAGNOSIS — R8281 Pyuria: Secondary | ICD-10-CM

## 2019-05-31 MED ORDER — CETIRIZINE HCL 10 MG PO TABS: 10.0000 mg | ORAL_TABLET | Freq: Every day | ORAL | 11 refills | Status: DC

## 2019-05-31 MED ORDER — GABAPENTIN 300 MG PO CAPS: 600.0000 mg | ORAL_CAPSULE | Freq: Two times a day (BID) | ORAL | 1 refills | Status: DC

## 2019-05-31 MED ORDER — AMLODIPINE BESYLATE 10 MG PO TABS: 10.0000 mg | ORAL_TABLET | Freq: Every day | ORAL | 1 refills | Status: DC

## 2019-05-31 MED ORDER — BACLOFEN 10 MG PO TABS: 10.0000 mg | ORAL_TABLET | Freq: Three times a day (TID) | ORAL | 1 refills | Status: DC

## 2019-05-31 MED ORDER — POTASSIUM CHLORIDE CRYS ER 20 MEQ PO TBCR: 20.0000 meq | EXTENDED_RELEASE_TABLET | Freq: Every day | ORAL | 1 refills | Status: DC

## 2019-05-31 MED ORDER — ALBUTEROL SULFATE HFA 108 (90 BASE) MCG/ACT IN AERS: 2.0000 | INHALATION_SPRAY | Freq: Four times a day (QID) | RESPIRATORY_TRACT | 6 refills | Status: DC | PRN

## 2019-05-31 MED ORDER — VENLAFAXINE HCL ER 75 MG PO CP24: ORAL_CAPSULE | ORAL | 1 refills | Status: DC

## 2019-05-31 NOTE — Progress Notes (Signed)
BP 120/80 (BP Location: Left Arm, Patient Position: Sitting, Cuff Size: Normal)   Pulse 69   Temp 98.3 F (36.8 C) (Oral)   SpO2 98%    Subjective:    Patient ID: Kaylee Fox, female    DOB: Apr 13, 1960, 59 y.o.   MRN: [id number]  HPI: Kaylee Fox is a 59 y.o. female  Chief Complaint  Patient presents with  . Hyperlipidemia  . Depression  . Hypothyroidism   HYPERLIPIDEMIA Hyperlipidemia status: excellent compliance Satisfied with current treatment?  yes Side effects:  no Medication compliance: excellent compliance Supplements: none Aspirin:  no The 10-year ASCVD risk score Denman George DC Jr., et al., 2013) is: 3.4%   Values used to calculate the score:     Age: 68 years     Sex: Female     Is Non-Hispanic African American: No     Diabetic: No     Tobacco smoker: No     Systolic Blood Pressure: 120 mmHg     Is BP treated: Yes     HDL Cholesterol: 54 mg/dL     Total Cholesterol: 233 mg/dL Chest pain:  no  HYPERTENSION Hypertension status: controlled  Satisfied with current treatment? yes Duration of hypertension: chronic BP monitoring frequency:  not checking BP medication side effects:  no Medication compliance: excellent compliance Previous BP meds:amlodipine, lasix Aspirin: no Recurrent headaches: no Visual changes: no Palpitations: no Dyspnea: no Chest pain: no Lower extremity edema: no Dizzy/lightheaded: no   DEPRESSION Mood status: controlled Satisfied with current treatment?: yes Symptom severity: mild  Duration of current treatment : chronic Side effects: no Medication compliance: excellent compliance Psychotherapy/counseling: no  Previous psychiatric medications: effexor Depressed mood: no Anxious mood: no Anhedonia: no Significant weight loss or gain: no Insomnia: no  Fatigue: no Feelings of worthlessness or guilt: no Impaired concentration/indecisiveness: no Suicidal ideations: no Hopelessness: no Crying spells: no Depression screen  [facility] 2/9 05/31/2019 11/30/2018 11/02/2018 08/24/2017 08/22/2017  Decreased Interest 0 2 0 0 3  Down, Depressed, Hopeless 0 0 0 3 2  PHQ - 2 Score 0 2 0 3 5  Altered sleeping 0 1 - 1 0  Tired, decreased energy 0 1 - 1 1  Change in appetite 0 0 - 0 0  Feeling bad or failure about yourself  0 0 - 3 0  Trouble concentrating 0 0 - 0 0  Moving slowly or fidgety/restless 0 0 - 0 0  Suicidal thoughts 0 0 - 0 0  PHQ-9 Score 0 4 - 8 6  Difficult doing work/chores Not difficult at all Not difficult at all - Not difficult at all Not difficult at all    HYPOTHYROIDISM Thyroid control status:controlled Satisfied with current treatment? yes Medication side effects: no Medication compliance: excellent compliance Etiology of hypothyroidism:  Recent dose adjustment:no Fatigue: no Cold intolerance: no Heat intolerance: no Weight gain: no Weight loss: no Constipation: no Diarrhea/loose stools: no Palpitations: no Lower extremity edema: no Anxiety/depressed mood: no   Relevant past medical, surgical, family and social history reviewed and updated as indicated. Interim medical history since our last visit reviewed. Allergies and medications reviewed and updated.  Review of Systems  Constitutional: Negative.   Respiratory: Negative.   Cardiovascular: Negative.   Gastrointestinal: Negative.   Musculoskeletal: Negative.   Neurological: Negative.   Psychiatric/Behavioral: Negative.     Per HPI unless specifically indicated above     Objective:    BP 120/80 (BP Location: Left Arm, Patient Position: Sitting, Cuff Size: Normal)  Pulse 69   Temp 98.3 F (36.8 C) (Oral)   SpO2 98%   Wt Readings from Last 3 Encounters:  02/06/19 250 lb (113.4 kg)  11/30/18 272 lb 6.4 oz (123.6 kg)  04/20/18 250 lb (113.4 kg)    Physical Exam Vitals and nursing note reviewed.  Constitutional:      General: She is not in acute distress.    Appearance: Normal appearance. She is obese. She is not  ill-appearing, toxic-appearing or diaphoretic.  HENT:     Head: Normocephalic and atraumatic.     Right Ear: External ear normal.     Left Ear: External ear normal.     Nose: Nose normal.     Mouth/Throat:     Mouth: Mucous membranes are moist.     Pharynx: Oropharynx is clear.  Eyes:     General: No scleral icterus.       Right eye: No discharge.        Left eye: No discharge.     Extraocular Movements: Extraocular movements intact.     Conjunctiva/sclera: Conjunctivae normal.     Pupils: Pupils are equal, round, and reactive to light.  Cardiovascular:     Rate and Rhythm: Normal rate and regular rhythm.     Pulses: Normal pulses.     Heart sounds: Normal heart sounds. No murmur. No friction rub. No gallop.   Pulmonary:     Effort: Pulmonary effort is normal. No respiratory distress.     Breath sounds: Normal breath sounds. No stridor. No wheezing, rhonchi or rales.  Chest:     Chest wall: No tenderness.  Musculoskeletal:        General: Normal range of motion.     Cervical back: Normal range of motion and neck supple.  Skin:    General: Skin is warm and dry.     Capillary Refill: Capillary refill takes less than 2 seconds.     Coloration: Skin is not jaundiced or pale.     Findings: No bruising, erythema, lesion or rash.  Neurological:     General: No focal deficit present.     Mental Status: She is alert and oriented to person, place, and time. Mental status is at baseline.  Psychiatric:        Mood and Affect: Mood normal.        Behavior: Behavior normal.        Thought Content: Thought content normal.        Judgment: Judgment normal.     Results for orders placed or performed in visit on 05/31/19  Microscopic Examination   URINE  Result Value Ref Range   WBC, UA 11-30 (A) 0 - 5 /hpf   RBC None seen 0 - 2 /hpf   Epithelial Cells (non renal) 0-10 0 - 10 /hpf   Casts Present None seen /lpf   Cast Type Granular casts (A) N/A   Bacteria, UA Moderate (A) None  seen/Few  Urine Culture, Reflex   URINE  Result Value Ref Range   Urine Culture, Routine WILL FOLLOW   Microalbumin, Urine Waived  Result Value Ref Range   Microalb, Ur Waived 80 (H) 0 - 19 mg/L   Creatinine, Urine Waived 200 10 - 300 mg/dL   Microalb/Creat Ratio 30-300 (H) <30 mg/g  UA/M w/rflx Culture, Routine   Specimen: Urine   URINE  Result Value Ref Range   Specific Gravity, UA >1.030 (H) 1.005 - 1.030   pH, UA 5.5 5.0 -  7.5   Color, UA Yellow Yellow   Appearance Ur Hazy (A) Clear   Leukocytes,UA 3+ (A) Negative   Protein,UA Trace (A) Negative/Trace   Glucose, UA Negative Negative   Ketones, UA Trace (A) Negative   RBC, UA Negative Negative   Bilirubin, UA Negative Negative   Urobilinogen, Ur 1.0 0.2 - 1.0 mg/dL   Nitrite, UA Negative Negative   Microscopic Examination See below:    Urinalysis Reflex Comment       Assessment & Plan:   Problem List Items Addressed This Visit      Cardiovascular and Mediastinum   Angina pectoris (HCC)    Will keep BP and cholesterol under good control. Continue to follow with cardiology. Call with any concerns.       Relevant Medications   amLODipine (NORVASC) 10 MG tablet   Other Relevant Orders   CBC with Differential/Platelet   Comprehensive metabolic panel   Microalbumin, Urine Waived (Completed)   UA/M w/rflx Culture, Routine (Completed)   Pulmonary hypertensive arterial disease (HCC) - Primary    Will keep BP and cholesterol under good control. Continue to follow with cardiology. Call with any concerns.       Relevant Medications   amLODipine (NORVASC) 10 MG tablet   Other Relevant Orders   CBC with Differential/Platelet   Comprehensive metabolic panel   UA/M w/rflx Culture, Routine (Completed)     Endocrine   Autoimmune hypothyroidism    Rechecking levels today. Will treat as needed. Await results.       Relevant Orders   CBC with Differential/Platelet   Comprehensive metabolic panel   TSH   UA/M w/rflx  Culture, Routine (Completed)     Genitourinary   Benign hypertensive renal disease    Under good control on current regimen. Continue current regimen. Continue to monitor. Call with any concerns. Refills given. Labs drawn today.         Other   Depression    Under good control on current regimen. Continue current regimen. Continue to monitor. Call with any concerns. Refills given. Labs drawn today.       Relevant Medications   venlafaxine XR (EFFEXOR-XR) 75 MG 24 hr capsule   Other Relevant Orders   CBC with Differential/Platelet   Comprehensive metabolic panel   UA/M w/rflx Culture, Routine (Completed)   Anxiety    Under good control on current regimen. Continue current regimen. Continue to monitor. Call with any concerns. Refills given. Labs drawn today.       Relevant Medications   venlafaxine XR (EFFEXOR-XR) 75 MG 24 hr capsule   Other Relevant Orders   CBC with Differential/Platelet   Comprehensive metabolic panel   UA/M w/rflx Culture, Routine (Completed)   Vitamin D deficiency    Rechecking levels today. Await results. Treat as needed.       Relevant Orders   CBC with Differential/Platelet   Comprehensive metabolic panel   UA/M w/rflx Culture, Routine (Completed)   VITAMIN D 25 Hydroxy (Vit-D Deficiency, Fractures)   Lupus (systemic lupus erythematosus) (HCC)    Stable. Continue to follow with rheumatology. Call with any concerns.       Immunosuppression (HCC)    Stable. Continue to follow with rheumatology. Call with any concerns.       Morbid obesity with BMI of 50.0-59.9, adult (HCC)    Encouraged diet and exercise with goal of losing 1-2lbs per week. Call with any concerns.       Mixed hyperlipidemia    Under  good control on current regimen. Continue current regimen. Continue to monitor. Call with any concerns. Refills given. Labs drawn today.       Relevant Medications   amLODipine (NORVASC) 10 MG tablet   Other Relevant Orders   CBC with  Differential/Platelet   Comprehensive metabolic panel   Lipid Panel w/o Chol/HDL Ratio   UA/M w/rflx Culture, Routine (Completed)       Follow up plan: Return in about 6 months (around 12/01/2019) for wellness/physical.

## 2019-05-31 NOTE — Assessment & Plan Note (Signed)
Under good control on current regimen. Continue current regimen. Continue to monitor. Call with any concerns. Refills given. Labs drawn today.   

## 2019-05-31 NOTE — Assessment & Plan Note (Signed)
Encouraged diet and exercise with goal of losing 1-2lbs per week. Call with any concerns.  

## 2019-05-31 NOTE — Assessment & Plan Note (Signed)
Will keep BP and cholesterol under good control. Continue to follow with cardiology. Call with any concerns.  

## 2019-05-31 NOTE — Patient Instructions (Signed)
We are recommending the vaccine to everyone who has not had an allergic reaction to any of the components of the vaccine. If you have specific questions about the vaccine, please bring them up with your health care provider to discuss them.   We will likely not be getting the vaccine in the office for the first rounds of vaccinations. The way they are releasing the vaccines is going to be through the health systems (like [facility], [facility], [facility], [facility]), through your county health department, or through the pharmacies.   The [place] [facility] is giving vaccines to those 65+ and Health Care Workers Teachers and Child Care providers start 05/02/19, Essential workers start 3/10 and those with co-morbidities start 05/30/19 Call [phone] to schedule  If you are 65+ you can get a vaccine through [place] by signing up for an appointment.  You can sign up by going to: [email or web address].  You can get more information by going to: https://covid19.ncdhhs.gov/vaccines  [facility] next door is giving the Moderna Vaccine- you can call [phone] or stop by there to schedule.  

## 2019-05-31 NOTE — Assessment & Plan Note (Signed)
Stable. Continue to follow with rheumatology. Call with any concerns.  

## 2019-05-31 NOTE — Assessment & Plan Note (Signed)
Rechecking levels today. Will treat as needed. Await results.  

## 2019-05-31 NOTE — Assessment & Plan Note (Signed)
Rechecking levels today. Await results. Treat as needed.  

## 2019-06-01 LAB — COMPREHENSIVE METABOLIC PANEL
ALT: 12 IU/L (ref 0–32)
AST: 20 IU/L (ref 0–40)
Albumin/Globulin Ratio: 1.6 (ref 1.2–2.2)
Albumin: 4 g/dL (ref 3.8–4.9)
Alkaline Phosphatase: 105 IU/L (ref 39–117)
BUN/Creatinine Ratio: 13 (ref 9–23)
BUN: 9 mg/dL (ref 6–24)
Bilirubin Total: 0.3 mg/dL (ref 0.0–1.2)
CO2: 27 mmol/L (ref 20–29)
Calcium: 9.2 mg/dL (ref 8.7–10.2)
Chloride: 104 mmol/L (ref 96–106)
Creatinine, Ser: 0.72 mg/dL (ref 0.57–1.00)
GFR calc Af Amer: 108 mL/min/{1.73_m2} (ref >59)
GFR calc non Af Amer: 93 mL/min/{1.73_m2} (ref >59)
Globulin, Total: 2.5 g/dL (ref 1.5–4.5)
Glucose: 95 mg/dL (ref 65–99)
Potassium: 4.4 mmol/L (ref 3.5–5.2)
Sodium: 141 mmol/L (ref 134–144)
Total Protein: 6.5 g/dL (ref 6.0–8.5)

## 2019-06-01 LAB — VITAMIN D 25 HYDROXY (VIT D DEFICIENCY, FRACTURES): Vit D, 25-Hydroxy: 27.4 ng/mL — ABNORMAL LOW (ref 30.0–100.0)

## 2019-06-01 LAB — CBC WITH DIFFERENTIAL/PLATELET
Basophils Absolute: 0 10*3/uL (ref 0.0–0.2)
Basos: 0 %
EOS (ABSOLUTE): 0 10*3/uL (ref 0.0–0.4)
Eos: 0 %
Hematocrit: 38.7 % (ref 34.0–46.6)
Hemoglobin: 12.9 g/dL (ref 11.1–15.9)
Immature Grans (Abs): 0 10*3/uL (ref 0.0–0.1)
Immature Granulocytes: 0 %
Lymphocytes Absolute: 1.7 10*3/uL (ref 0.7–3.1)
Lymphs: 36 %
MCH: 32.5 pg (ref 26.6–33.0)
MCHC: 33.3 g/dL (ref 31.5–35.7)
MCV: 98 fL — ABNORMAL HIGH (ref 79–97)
Monocytes Absolute: 0.4 10*3/uL (ref 0.1–0.9)
Monocytes: 8 %
Neutrophils Absolute: 2.6 10*3/uL (ref 1.4–7.0)
Neutrophils: 56 %
Platelets: 283 10*3/uL (ref 150–450)
RBC: 3.97 x10E6/uL (ref 3.77–5.28)
RDW: 12.2 % (ref 11.7–15.4)
WBC: 4.7 10*3/uL (ref 3.4–10.8)

## 2019-06-01 LAB — LIPID PANEL W/O CHOL/HDL RATIO
Cholesterol, Total: 222 mg/dL — ABNORMAL HIGH (ref 100–199)
HDL: 53 mg/dL (ref >39)
LDL Chol Calc (NIH): 156 mg/dL — ABNORMAL HIGH (ref 0–99)
Triglycerides: 73 mg/dL (ref 0–149)
VLDL Cholesterol Cal: 13 mg/dL (ref 5–40)

## 2019-06-01 LAB — TSH: TSH: 2.01 u[IU]/mL (ref 0.450–4.500)

## 2019-06-02 LAB — UA/M W/RFLX CULTURE, ROUTINE
Bilirubin, UA: NEGATIVE
Glucose, UA: NEGATIVE
Nitrite, UA: NEGATIVE
RBC, UA: NEGATIVE
Specific Gravity, UA: >1.03 — ABNORMAL HIGH (ref 1.005–1.030)
Urobilinogen, Ur: 1 mg/dL (ref 0.2–1.0)
pH, UA: 5.5 (ref 5.0–7.5)

## 2019-06-02 LAB — MICROSCOPIC EXAMINATION: RBC, Urine: NONE SEEN /HPF (ref 0–2)

## 2019-06-02 LAB — MICROALBUMIN, URINE WAIVED
Creatinine, Urine Waived: 200 mg/dL (ref 10–300)
Microalb, Ur Waived: 80 mg/L — ABNORMAL HIGH (ref 0–19)

## 2019-06-02 LAB — URINE CULTURE, REFLEX

## 2019-06-16 ENCOUNTER — Ambulatory Visit: Payer: Medicare HMO | Attending: Oncology

## 2019-06-16 DIAGNOSIS — Z23 Encounter for immunization: Secondary | ICD-10-CM

## 2019-06-16 NOTE — Progress Notes (Signed)
   Covid-19 Vaccination Clinic  Name:  Kaylee Fox    MRN: [id number] DOB: [birth year]  06/16/2019  Ms. Cheek was observed post Covid-19 immunization for 15 minutes without incident. She was provided with Vaccine Information Sheet and instruction to access the V-Safe system.   [name] was instructed to call 911 with any severe reactions post vaccine: Marland Kitchen Difficulty breathing  . Swelling of face and throat  . A fast heartbeat  . A bad rash all over body  . Dizziness and weakness   Immunizations Administered    Name Date Dose VIS Date Route   Pfizer COVID-19 Vaccine 06/16/2019 12:51 PM 0.3 mL 02/16/2019 Intramuscular   Manufacturer: [employer], [employer]   Lot: [id number]   NDC: [address]-9

## 2019-07-11 ENCOUNTER — Ambulatory Visit: Payer: Medicare HMO

## 2019-07-11 ENCOUNTER — Ambulatory Visit: Payer: Medicare HMO | Attending: Internal Medicine

## 2019-07-11 DIAGNOSIS — Z23 Encounter for immunization: Secondary | ICD-10-CM

## 2019-07-11 NOTE — Progress Notes (Signed)
   Covid-19 Vaccination Clinic  Name:  Shelsy Seng    MRN: [id number] DOB: April 04, 1960  07/11/2019  Ms. Kundrat was observed post Covid-19 immunization for 15 minutes without incident. She was provided with Vaccine Information Sheet and instruction to access the V-Safe system.   Ms. Baranski was instructed to call 911 with any severe reactions post vaccine: Marland Kitchen Difficulty breathing  . Swelling of face and throat  . A fast heartbeat  . A bad rash all over body  . Dizziness and weakness   Immunizations Administered    Name Date Dose VIS Date Route   Pfizer COVID-19 Vaccine 07/11/2019  2:22 PM 0.3 mL 05/02/2018 Intramuscular   Manufacturer: [employer], [employer]   Lot: [id number]   NDC: [address]-3

## 2019-07-16 DIAGNOSIS — Z8679 Personal history of other diseases of the circulatory system: Secondary | ICD-10-CM | POA: Diagnosis not present

## 2019-07-16 DIAGNOSIS — J984 Other disorders of lung: Secondary | ICD-10-CM | POA: Diagnosis not present

## 2019-07-16 DIAGNOSIS — B351 Tinea unguium: Secondary | ICD-10-CM | POA: Diagnosis not present

## 2019-07-16 DIAGNOSIS — E669 Obesity, unspecified: Secondary | ICD-10-CM | POA: Diagnosis not present

## 2019-07-16 DIAGNOSIS — Z6841 Body Mass Index (BMI) 40.0 and over, adult: Secondary | ICD-10-CM | POA: Diagnosis not present

## 2019-07-16 DIAGNOSIS — Z79899 Other long term (current) drug therapy: Secondary | ICD-10-CM | POA: Diagnosis not present

## 2019-07-16 DIAGNOSIS — Z86711 Personal history of pulmonary embolism: Secondary | ICD-10-CM | POA: Diagnosis not present

## 2019-07-16 DIAGNOSIS — E559 Vitamin D deficiency, unspecified: Secondary | ICD-10-CM | POA: Diagnosis not present

## 2019-07-16 DIAGNOSIS — M329 Systemic lupus erythematosus, unspecified: Secondary | ICD-10-CM | POA: Diagnosis not present

## 2019-08-01 ENCOUNTER — Other Ambulatory Visit: Payer: Self-pay | Admitting: Family Medicine

## 2019-08-01 NOTE — Telephone Encounter (Signed)
Requested Prescriptions  Pending Prescriptions Disp Refills  . levothyroxine (SYNTHROID) 75 MCG tablet [Pharmacy Med Name: LEVOTHYROXINE 0.075MG  ( ) TABS] 90 tablet 1    Sig: TAKE 1 TABLET(75 MCG) BY MOUTH DAILY BEFORE BREAKFAST     Endocrinology:  Hypothyroid Agents Failed - 08/01/2019  3:30 AM      Failed - TSH needs to be rechecked within 3 months after an abnormal result. Refill until TSH is due.      Passed - TSH in normal range and within 360 days    TSH  Date Value Ref Range Status  05/31/2019 2.010 0.450 - 4.500 uIU/mL Final         Passed - Valid encounter within last 12 months    Recent Outpatient Visits          2 months ago Pulmonary hypertensive arterial disease (HCC)   [facility] [place], Megan P, DO   5 months ago Acute maxillary sinusitis, recurrence not specified   [facility] [place], [place]   7 months ago Acute right-sided low back pain, unspecified whether sciatica present   [facility] [place], [place]   8 months ago Routine general medical examination at a health care facility   [facility], [place] P, DO   1 year ago Spontaneous hematoma of hand   [facility] Dorcas Carrow, DO      Future Appointments            In 4 months Laural Benes, Oralia Rud, DO [employer]

## 2019-08-08 ENCOUNTER — Other Ambulatory Visit: Payer: Self-pay | Admitting: Family Medicine

## 2019-10-26 ENCOUNTER — Telehealth: Payer: Self-pay | Admitting: Family Medicine

## 2019-10-26 NOTE — Telephone Encounter (Signed)
Routing to provider to advise.  

## 2019-10-26 NOTE — Telephone Encounter (Signed)
Copied from CRM [id number]. Topic: General - Inquiry >> Oct 26, 2019  9:47 AM Deborha Payment wrote: Reason for CRM: patient Is requesting a mouth wash due to her rollers in her mouth ulcers.  Patient states she was prescribed this before but does not know the name of medication.    PHARMACY [id number] - [place], [place] - [address] AT [facility] OF [address] [facility]  Phone:  [phone] Fax:  [phone]

## 2019-10-26 NOTE — Telephone Encounter (Signed)
I don't have any record of any mouth wash for her. Is she sure she didn't get it from her dentist?

## 2019-10-29 NOTE — Telephone Encounter (Signed)
Patient notified. States she is going to try to contact her Lupus doctor.

## 2019-11-07 ENCOUNTER — Telehealth: Payer: Self-pay | Admitting: Family Medicine

## 2019-11-07 ENCOUNTER — Other Ambulatory Visit: Payer: Self-pay

## 2019-11-07 NOTE — Telephone Encounter (Signed)
Needs RX sent to [facility] please.

## 2019-11-07 NOTE — Telephone Encounter (Signed)
Medication Refill - Medication: Gabapentin 300 mg patient needs a 7 day supply to go to [facility] she is completely out.  Has the patient contacted their pharmacy? Yes.   (Agent: If no, request that the patient contact the pharmacy for the refill.) (Agent: If yes, when and what did the pharmacy advise?)  Preferred Pharmacy (with phone number or street name): [id number] - [place], [place] - [address] AT [facility] OF [address]  Agent: Please be advised that RX refills may take up to 3 business days. We ask that you follow-up with your pharmacy.

## 2019-11-09 MED ORDER — GABAPENTIN 300 MG PO CAPS: 600.0000 mg | ORAL_CAPSULE | Freq: Two times a day (BID) | ORAL | 0 refills | Status: DC

## 2019-11-09 MED ORDER — LEVOTHYROXINE SODIUM 75 MCG PO TABS: ORAL_TABLET | ORAL | 3 refills | Status: DC

## 2019-11-09 NOTE — Telephone Encounter (Signed)
30 day supply sent to her pharmacy. 

## 2019-11-26 ENCOUNTER — Ambulatory Visit (INDEPENDENT_AMBULATORY_CARE_PROVIDER_SITE_OTHER): Payer: Medicare HMO

## 2019-11-26 VITALS — Ht 63.0 in | Wt 250.0 lb

## 2019-11-26 DIAGNOSIS — Z0000 Encounter for general adult medical examination without abnormal findings: Secondary | ICD-10-CM | POA: Diagnosis not present

## 2019-11-26 NOTE — Progress Notes (Signed)
I connected with Kaylee Fox today by telephone and verified that I am speaking with the correct person using two identifiers. Location patient: home Location provider: work Persons participating in the virtual visit: Caleigha, Zale LPN.   I discussed the limitations, risks, security and privacy concerns of performing an evaluation and management service by telephone and the availability of in person appointments. I also discussed with the patient that there may be a patient responsible charge related to this service. The patient expressed understanding and verbally consented to this telephonic visit.    Interactive audio and video telecommunications were attempted between this provider and patient, however failed, due to patient having technical difficulties OR patient did not have access to video capability.  We continued and completed visit with audio only.     Vital signs may be patient reported or missing.  Subjective:   Kaylee Fox is a 59 y.o. female who presents for Medicare Annual (Subsequent) preventive examination.  Review of Systems     Cardiac Risk Factors include: obesity (BMI >30kg/m2);sedentary lifestyle     Objective:    Today's Vitals   11/26/19 1256  Weight: 250 lb (113.4 kg)  Height: 5\' 3"  (1.6 m)   Body mass index is 44.29 kg/m.  Advanced Directives 11/26/2019 11/02/2018 08/22/2017 11/09/2016 08/20/2016 07/13/2016 06/22/2016  Does Patient Have a Medical Advance Directive? Yes Yes No No No No No  Type of 06/24/2016 of [place];Living will Living will;Healthcare Power of Attorney - - - - -  Copy of Healthcare Power of Attorney in Chart? No - copy requested No - copy requested - - - - -  Would patient like information on creating a medical advance directive? - - Yes (MAU/Ambulatory/Procedural Areas - Information given) - Yes (MAU/Ambulatory/Procedural Areas - Information given) - -    Current Medications (verified) Outpatient  Encounter Medications as of 11/26/2019  Medication Sig  . acetaminophen (TYLENOL) 325 MG tablet Take by mouth as needed.   11/28/2019 albuterol (VENTOLIN HFA) 108 (90 Base) MCG/ACT inhaler Inhale 2 puffs into the lungs every 6 (six) hours as needed for wheezing or shortness of breath.  . ALPRAZolam (XANAX) 0.5 MG tablet at bedtime as needed.   Marland Kitchen amLODipine (NORVASC) 10 MG tablet Take 1 tablet (10 mg total) by mouth daily.  Marland Kitchen azaTHIOprine (IMURAN) 50 MG tablet Take 100 mg by mouth daily.  . baclofen (LIORESAL) 10 MG tablet Take 1 tablet (10 mg total) by mouth 3 (three) times daily.  . calcium carbonate (CALCIUM 600) 600 MG TABS tablet Take by mouth.  . cetirizine (ZYRTEC) 10 MG tablet Take 1 tablet (10 mg total) by mouth daily.  . Cholecalciferol (D3-1000) 1000 units capsule Take 1,000 Units by mouth daily. Takes 2 1000 mg per day  . ELIQUIS 5 MG TABS tablet TAKE 1 TABLET TWICE DAILY  . fluticasone (FLONASE) 50 MCG/ACT nasal spray USE 2 SPRAYS INTO BOTH NOSTRILS DAILY.  . furosemide (LASIX) 40 MG tablet TAKE 2 TABLETS TWICE DAILY  . gabapentin (NEURONTIN) 300 MG capsule Take 2 capsules (600 mg total) by mouth 2 (two) times daily.  . hydroxychloroquine (PLAQUENIL) 200 MG tablet Take 200 mg by mouth. Take 2 200 mg tabs a day  . hydrOXYzine (ATARAX/VISTARIL) 25 MG tablet TAKE 1 TABLET AT BEDTIME AS NEEDED.  Marland Kitchen levothyroxine (SYNTHROID) 75 MCG tablet TAKE 1 TABLET(75 MCG) BY MOUTH DAILY BEFORE BREAKFAST  . montelukast (SINGULAIR) 10 MG tablet TAKE 1 TABLET AT BEDTIME  . Multiple Vitamin (MULTI-VITAMINS)  TABS Take by mouth.  . mupirocin cream (BACTROBAN) 2 % Apply to affected area on arms/legs twice a day as needed.  . pantoprazole (PROTONIX) 40 MG tablet TAKE 1 TABLET EVERY DAY  . potassium chloride SA (KLOR-CON) 20 MEQ tablet Take 1 tablet (20 mEq total) by mouth daily.  Marland Kitchen venlafaxine XR (EFFEXOR-XR) 150 MG 24 hr capsule TAKE 1 CAPSULE DAILY WITH BREAKFAST  . venlafaxine XR (EFFEXOR-XR) 75 MG 24 hr capsule  TAKE 1 CAPSULE EVERY DAY WITH BREAKFAST  . VOLTAREN 1 % GEL    No facility-administered encounter medications on file as of 11/26/2019.    Allergies (verified) Penicillins, Clindamycin, Ciprofloxacin, and Morphine   History: Past Medical History:  Diagnosis Date  . Anticoagulant long-term use   . Anxiety   . Autoimmune hypothyroidism   . Chronic hypokalemia   . Chronic pain   . Chronic, continuous use of opioids   . Depression   . Diverticulosis   . Fatigue   . Fibromyalgia   . Herpes labialis   . Herpes simplex   . History of pulmonary embolus (PE)   . History of pulmonary hypertension   . Hypertension   . Lupus (systemic lupus erythematosus) (HCC)   . Neuromuscular disorder (HCC)   . OSA on CPAP   . Osteoporosis   . Venous ulcers of both lower extremities (HCC) 06/17/2016  . Vitamin D deficiency    Past Surgical History:  Procedure Laterality Date  . CESAREAN SECTION    . CHOLECYSTECTOMY    . COLONOSCOPY WITH PROPOFOL N/A 07/16/2016   Procedure: COLONOSCOPY WITH PROPOFOL;  Surgeon: Wyline Mood, MD;  Location: [facility] ENDOSCOPY;  Service: Endoscopy;  Laterality: N/A;  . LAPAROSCOPIC GASTRIC SLEEVE RESECTION     Family History  Problem Relation Age of Onset  . Arthritis Mother   . Osteoarthritis Mother   . Lupus Mother   . Hypothyroidism Mother   . Osteoarthritis Father   . Emphysema Father   . Coronary artery disease Father    Social History   Socioeconomic History  . Marital status: Legally Separated    Spouse name: Not on file  . Number of children: Not on file  . Years of education: Not on file  . Highest education level: High school graduate  Occupational History  . Occupation: disability   Tobacco Use  . Smoking status: Never Smoker  . Smokeless tobacco: Never Used  Vaping Use  . Vaping Use: Never used  Substance and Sexual Activity  . Alcohol use: No  . Drug use: No  . Sexual activity: Yes    Birth control/protection: Post-menopausal  Other  Topics Concern  . Not on file  Social History Narrative  . Not on file   Social Determinants of Health   Financial Resource Strain: Low Risk   . Difficulty of Paying Living Expenses: Not hard at all  Food Insecurity: No Food Insecurity  . Worried About Programme researcher, broadcasting/film/video in the Last Year: Never true  . Ran Out of Food in the Last Year: Never true  Transportation Needs: No Transportation Needs  . Lack of Transportation (Medical): No  . Lack of Transportation (Non-Medical): No  Physical Activity: Insufficiently Active  . Days of Exercise per Week: 3 days  . Minutes of Exercise per Session: 30 min  Stress: No Stress Concern Present  . Feeling of Stress : Not at all  Social Connections:   . Frequency of Communication with Friends and Family: Not on file  .  Frequency of Social Gatherings with Friends and Family: Not on file  . Attends Religious Services: Not on file  . Active Member of Clubs or Organizations: Not on file  . Attends BankerClub or Organization Meetings: Not on file  . Marital Status: Not on file    Tobacco Counseling Counseling given: Not Answered   Clinical Intake:  Pre-visit preparation completed: Yes  Pain : No/denies pain     Nutritional Status: BMI > 30  Obese Nutritional Risks: None Diabetes: No  How often do you need to have someone help you when you read instructions, pamphlets, or other written materials from your doctor or pharmacy?: 1 - Never What is the last grade level you completed in school?: 12th grade  Diabetic? no  Interpreter Needed?: No  Information entered by :: NAllen LPN   Activities of Daily Living In your present state of health, do you have any difficulty performing the following activities: 11/26/2019 11/30/2018  Hearing? Y Y  Comment left ear decreased -  Vision? N N  Difficulty concentrating or making decisions? N N  Walking or climbing stairs? N N  Dressing or bathing? N N  Doing errands, shopping? N N  Preparing Food  and eating ? N -  Using the Toilet? N -  In the past six months, have you accidently leaked urine? N -  Do you have problems with loss of bowel control? N -  Managing your Medications? N -  Managing your Finances? N -  Housekeeping or managing your Housekeeping? N -  Some recent data might be hidden    Patient Care Team: Dorcas CarrowJohnson, Megan P, DO as PCP - General (Family Medicine) Wyn Quakerew, Marlow BaarsJason S, MD as Referring Physician (Vascular Surgery) Marcille BlancoBovard, Scott, MD as Referring Physician (Surgery) Brayton ElWienmann, Sophia, MD as Referring Physician (Rheumatology) Gustavus BryantJoyce, Brooke L, LCSW as Social Worker (Licensed Clinical Social Worker)  Indicate any recent Medical Services you may have received from other than [facility] providers in the past year (date may be approximate).     Assessment:   This is a routine wellness examination for Kaylee Fox.  Hearing/Vision screen  Hearing Screening   125Hz  250Hz  500Hz  1000Hz  2000Hz  3000Hz  4000Hz  6000Hz  8000Hz   Right ear:           Left ear:           Vision Screening Comments: Regular eye exams, [facility]  Dietary issues and exercise activities discussed: Current Exercise Habits: Home exercise routine, Type of exercise: walking, Time (Minutes): 30, Frequency (Times/Week): 3, Weekly Exercise (Minutes/Week): 90  Goals    . DIET - INCREASE WATER INTAKE     Recommend continue drinking at least 6-8 glasses of water a day     . Patient Stated     11/26/2019, wants to weigh 200 pounds      Depression Screen PHQ 2/9 Scores 11/26/2019 05/31/2019 11/30/2018 11/02/2018 08/24/2017 08/22/2017 05/09/2017  PHQ - 2 Score 0 0 2 0 3 5 0  PHQ- 9 Score - 0 4 - 8 6 3     Fall Risk Fall Risk  11/26/2019 05/31/2019 11/30/2018 11/02/2018 08/22/2017  Falls in the past year? 1 0 0 0 No  Comment lost balance trying to turn fast - - - -  Number falls in past yr: 1 0 0 - -  Injury with Fall? 0 0 0 - -  Risk for fall due to : Impaired balance/gait;Medication side effect - - - -  Follow up Falls  evaluation completed;Education provided;Falls prevention discussed - - - -  Any stairs in or around the home? No  If so, are there any without handrails? n/a Home free of loose throw rugs in walkways, pet beds, electrical cords, etc? Yes  Adequate lighting in your home to reduce risk of falls? Yes   ASSISTIVE DEVICES UTILIZED TO PREVENT FALLS:  Life alert? No  Use of a cane, walker or w/c? No  Grab bars in the bathroom? No  Shower chair or bench in shower? No  Elevated toilet seat or a handicapped toilet? No   TIMED UP AND GO:  Was the test performed? No .    Cognitive Function:     6CIT Screen 11/26/2019 08/22/2017 08/20/2016  What Year? 0 points 0 points 0 points  What month? 0 points 0 points 0 points  What time? 0 points 0 points 0 points  Count back from 20 0 points 0 points 0 points  Months in reverse 0 points 0 points 0 points  Repeat phrase 0 points 0 points 4 points  Total Score 0 0 4    Immunizations Immunization History  Administered Date(s) Administered  . Influenza Inj Mdck Quad Pf 12/13/2017  . Influenza,inj,Quad PF,6+ Mos 12/10/2016, 11/09/2018  . Influenza-Unspecified 12/18/2013, 12/10/2016  . PFIZER SARS-COV-2 Vaccination 06/16/2019, 07/11/2019  . Pneumococcal Conjugate-13 05/04/2017  . Pneumococcal Polysaccharide-23 02/24/2010  . Tdap 08/22/2015    TDAP status: Up to date Flu Vaccine status: Up to date Pneumococcal vaccine status: Up to date Covid-19 vaccine status: Completed vaccines  Qualifies for Shingles Vaccine? Yes   Zostavax completed No   Shingrix Completed?: No.    Education has been provided regarding the importance of this vaccine. Patient has been advised to call insurance company to determine out of pocket expense if they have not yet received this vaccine. Advised may also receive vaccine at local pharmacy or Health Dept. Verbalized acceptance and understanding.  Screening Tests Health Maintenance  Topic Date Due  . MAMMOGRAM   Never done  . INFLUENZA VACCINE  10/07/2019  . COLONOSCOPY  07/16/2021  . PAP SMEAR-Modifier  08/20/2021  . TETANUS/TDAP  08/21/2025  . COVID-19 Vaccine  Completed  . Hepatitis C Screening  Completed  . HIV Screening  Completed    Health Maintenance  Health Maintenance Due  Topic Date Due  . MAMMOGRAM  Never done  . INFLUENZA VACCINE  10/07/2019    Colorectal cancer screening: Completed 07/16/2016. Repeat every 5 years Mammogram status: Due Bone Density status: n/a  Lung Cancer Screening: (Low Dose CT Chest recommended if Age 51-80 years, 30 pack-year currently smoking OR have quit w/in 15years.) does not qualify.   Lung Cancer Screening Referral: no  Additional Screening:  Hepatitis C Screening: does qualify; Completed 08/24/2017  Vision Screening: Recommended annual ophthalmology exams for early detection of glaucoma and other disorders of the eye. Is the patient up to date with their annual eye exam?  Yes  Who is the provider or what is the name of the office in which the patient attends annual eye exams? [facility] If pt is not established with a provider, would they like to be referred to a provider to establish care? No .   Dental Screening: Recommended annual dental exams for proper oral hygiene  Community Resource Referral / Chronic Care Management: CRR required this visit?  No   CCM required this visit?  No      Plan:     I have personally reviewed and noted the following in the patient's chart:   . Medical  and social history . Use of alcohol, tobacco or illicit drugs  . Current medications and supplements . Functional ability and status . Nutritional status . Physical activity . Advanced directives . List of other physicians . Hospitalizations, surgeries, and ER visits in previous 12 months . Vitals . Screenings to include cognitive, depression, and falls . Referrals and appointments  In addition, I have reviewed and discussed with patient  certain preventive protocols, quality metrics, and best practice recommendations. A written personalized care plan for preventive services as well as general preventive health recommendations were provided to patient.     Barb Merino, LPN   3/00/9233   Nurse Notes: Patient states that she has had a couple of falls where he left leg gave out when she tried to turn around fast. She states that everything she eats burns her tongue. She has an appointment with PCP on the 28th. Encouraged her to reach out to PCP prior to appointment to see if there are any recommendations that she can be given to help.

## 2019-11-26 NOTE — Patient Instructions (Signed)
[name] , Thank you for taking time to come for your Medicare Wellness Visit. I appreciate your ongoing commitment to your health goals. Please review the following plan we discussed and let me know if I can assist you in the future.   Screening recommendations/referrals: Colonoscopy: completed 07/16/2016 Mammogram: due Bone Density: n/a Recommended yearly ophthalmology/optometry visit for glaucoma screening and checkup Recommended yearly dental visit for hygiene and checkup  Vaccinations: Influenza vaccine: due Pneumococcal vaccine: completed 05/04/2017 Tdap vaccine: completed 08/22/2015 Shingles vaccine: discussed  Covid-19:  07/11/2019, 06/16/2019  Advanced directives: Please bring a copy of your POA (Power of Attorney) and/or Living Will to your next appointment.   Conditions/risks identified: none  Next appointment: Follow up in one year for your annual wellness visit.   Preventive Care 40-64 Years, Female Preventive care refers to lifestyle choices and visits with your health care provider that can promote health and wellness. What does preventive care include?  A yearly physical exam. This is also called an annual well check.  Dental exams once or twice a year.  Routine eye exams. Ask your health care provider how often you should have your eyes checked.  Personal lifestyle choices, including:  Daily care of your teeth and gums.  Regular physical activity.  Eating a healthy diet.  Avoiding tobacco and drug use.  Limiting alcohol use.  Practicing safe sex.  Taking low-dose aspirin daily starting at age 81.  Taking vitamin and mineral supplements as recommended by your health care provider. What happens during an annual well check? The services and screenings done by your health care provider during your annual well check will depend on your age, overall health, lifestyle risk factors, and family history of disease. Counseling  Your health care provider may ask  you questions about your:  Alcohol use.  Tobacco use.  Drug use.  Emotional well-being.  Home and relationship well-being.  Sexual activity.  Eating habits.  Work and work Astr[employer].  Method of birth control.  Menstrual cycle.  Pregnancy history. Screening  You may have the following tests or measurements:  Height, weight, and BMI.  Blood pressure.  Lipid and cholesterol levels. These may be checked every 5 years, or more frequently if you are over 48 years old.  Skin check.  Lung cancer screening. You may have this screening every year starting at age 54 if you have a 30-pack-year history of smoking and currently smoke or have quit within the past 15 years.  Fecal occult blood test (FOBT) of the stool. You may have this test every year starting at age 38.  Flexible sigmoidoscopy or colonoscopy. You may have a sigmoidoscopy every 5 years or a colonoscopy every 10 years starting at age 25.  Hepatitis C blood test.  Hepatitis B blood test.  Sexually transmitted disease (STD) testing.  Diabetes screening. This is done by checking your blood sugar (glucose) after you have not eaten for a while (fasting). You may have this done every 1-3 years.  Mammogram. This may be done every 1-2 years. Talk to your health care provider about when you should start having regular mammograms. This may depend on whether you have a family history of breast cancer.  BRCA-related cancer screening. This may be done if you have a family history of breast, ovarian, tubal, or peritoneal cancers.  Pelvic exam and Pap test. This may be done every 3 years starting at age 62. Starting at age 81, this may be done every 5 years if you have  a Pap test in combination with an HPV test.  Bone density scan. This is done to screen for osteoporosis. You may have this scan if you are at high risk for osteoporosis. Discuss your test results, treatment options, and if necessary, the need for more tests  with your health care provider. Vaccines  Your health care provider may recommend certain vaccines, such as:  Influenza vaccine. This is recommended every year.  Tetanus, diphtheria, and acellular pertussis (Tdap, Td) vaccine. You may need a Td booster every 10 years.  Zoster vaccine. You may need this after age 25.  Pneumococcal 13-valent conjugate (PCV13) vaccine. You may need this if you have certain conditions and were not previously vaccinated.  Pneumococcal polysaccharide (PPSV23) vaccine. You may need one or two doses if you smoke cigarettes or if you have certain conditions. Talk to your health care provider about which screenings and vaccines you need and how often you need them. This information is not intended to replace advice given to you by your health care provider. Make sure you discuss any questions you have with your health care provider. Document Released: 03/21/2015 Document Revised: 11/12/2015 Document Reviewed: 12/24/2014 Elsevier Interactive Patient Education  2017 [place] Prevention in the Home Falls can cause injuries. They can happen to people of all ages. There are many things you can do to make your home safe and to help prevent falls. What can I do on the outside of my home?  Regularly fix the edges of walkways and driveways and fix any cracks.  Remove anything that might make you trip as you walk through a door, such as a raised step or threshold.  Trim any bushes or trees on the path to your home.  Use bright outdoor lighting.  Clear any walking paths of anything that might make someone trip, such as rocks or tools.  Regularly check to see if handrails are loose or broken. Make sure that both sides of any steps have handrails.  Any raised decks and porches should have guardrails on the edges.  Have any leaves, snow, or ice cleared regularly.  Use sand or salt on walking paths during winter.  Clean up any spills in your garage  right away. This includes oil or grease spills. What can I do in the bathroom?  Use night lights.  Install grab bars by the toilet and in the tub and shower. Do not use towel bars as grab bars.  Use non-skid mats or decals in the tub or shower.  If you need to sit down in the shower, use a plastic, non-slip stool.  Keep the floor dry. Clean up any water that spills on the floor as soon as it happens.  Remove soap buildup in the tub or shower regularly.  Attach bath mats securely with double-sided non-slip rug tape.  Do not have throw rugs and other things on the floor that can make you trip. What can I do in the bedroom?  Use night lights.  Make sure that you have a light by your bed that is easy to reach.  Do not use any sheets or blankets that are too big for your bed. They should not hang down onto the floor.  Have a firm chair that has side arms. You can use this for support while you get dressed.  Do not have throw rugs and other things on the floor that can make you trip. What can I do in the kitchen?  Clean up any spills right away.  Avoid walking on wet floors.  Keep items that you use a lot in easy-to-reach places.  If you need to reach something above you, use a strong step stool that has a grab bar.  Keep electrical cords out of the way.  Do not use floor polish or wax that makes floors slippery. If you must use wax, use non-skid floor wax.  Do not have throw rugs and other things on the floor that can make you trip. What can I do with my stairs?  Do not leave any items on the stairs.  Make sure that there are handrails on both sides of the stairs and use them. Fix handrails that are broken or loose. Make sure that handrails are as long as the stairways.  Check any carpeting to make sure that it is firmly attached to the stairs. Fix any carpet that is loose or worn.  Avoid having throw rugs at the top or bottom of the stairs. If you do have throw rugs,  attach them to the floor with carpet tape.  Make sure that you have a light switch at the top of the stairs and the bottom of the stairs. If you do not have them, ask someone to add them for you. What else can I do to help prevent falls?  Wear shoes that:  Do not have high heels.  Have rubber bottoms.  Are comfortable and fit you well.  Are closed at the toe. Do not wear sandals.  If you use a stepladder:  Make sure that it is fully opened. Do not climb a closed stepladder.  Make sure that both sides of the stepladder are locked into place.  Ask someone to hold it for you, if possible.  Clearly mark and make sure that you can see:  Any grab bars or handrails.  First and last steps.  Where the edge of each step is.  Use tools that help you move around (mobility aids) if they are needed. These include:  Canes.  Walkers.  Scooters.  Crutches.  Turn on the lights when you go into a dark area. Replace any light bulbs as soon as they burn out.  Set up your furniture so you have a clear path. Avoid moving your furniture around.  If any of your floors are uneven, fix them.  If there are any pets around you, be aware of where they are.  Review your medicines with your doctor. Some medicines can make you feel dizzy. This can increase your chance of falling. Ask your doctor what other things that you can do to help prevent falls. This information is not intended to replace advice given to you by your health care provider. Make sure you discuss any questions you have with your health care provider. Document Released: 12/19/2008 Document Revised: 07/31/2015 Document Reviewed: 03/29/2014 Elsevier Interactive Patient Education  2017 [employer].

## 2019-12-04 ENCOUNTER — Other Ambulatory Visit: Payer: Self-pay

## 2019-12-04 ENCOUNTER — Ambulatory Visit (INDEPENDENT_AMBULATORY_CARE_PROVIDER_SITE_OTHER): Payer: Medicare HMO | Admitting: Family Medicine

## 2019-12-04 ENCOUNTER — Encounter: Payer: Self-pay | Admitting: Family Medicine

## 2019-12-04 VITALS — BP 110/73 | HR 72 | Temp 98.2°F | Ht 63.0 in | Wt 275.8 lb

## 2019-12-04 DIAGNOSIS — I129 Hypertensive chronic kidney disease with stage 1 through stage 4 chronic kidney disease, or unspecified chronic kidney disease: Secondary | ICD-10-CM

## 2019-12-04 DIAGNOSIS — M545 Low back pain, unspecified: Secondary | ICD-10-CM

## 2019-12-04 DIAGNOSIS — E063 Autoimmune thyroiditis: Secondary | ICD-10-CM | POA: Diagnosis not present

## 2019-12-04 DIAGNOSIS — B370 Candidal stomatitis: Secondary | ICD-10-CM | POA: Diagnosis not present

## 2019-12-04 DIAGNOSIS — Z0000 Encounter for general adult medical examination without abnormal findings: Secondary | ICD-10-CM

## 2019-12-04 DIAGNOSIS — H6121 Impacted cerumen, right ear: Secondary | ICD-10-CM | POA: Diagnosis not present

## 2019-12-04 DIAGNOSIS — Z23 Encounter for immunization: Secondary | ICD-10-CM | POA: Diagnosis not present

## 2019-12-04 DIAGNOSIS — E782 Mixed hyperlipidemia: Secondary | ICD-10-CM

## 2019-12-04 DIAGNOSIS — E559 Vitamin D deficiency, unspecified: Secondary | ICD-10-CM | POA: Diagnosis not present

## 2019-12-04 DIAGNOSIS — F331 Major depressive disorder, recurrent, moderate: Secondary | ICD-10-CM

## 2019-12-04 LAB — URINALYSIS, ROUTINE W REFLEX MICROSCOPIC
Bilirubin, UA: NEGATIVE
Glucose, UA: NEGATIVE
Ketones, UA: NEGATIVE
Nitrite, UA: NEGATIVE
Protein,UA: NEGATIVE
RBC, UA: NEGATIVE
Specific Gravity, UA: 1.02 (ref 1.005–1.030)
Urobilinogen, Ur: 1 mg/dL (ref 0.2–1.0)
pH, UA: 5.5 (ref 5.0–7.5)

## 2019-12-04 LAB — MICROALBUMIN, URINE WAIVED
Creatinine, Urine Waived: 50 mg/dL (ref 10–300)
Microalb, Ur Waived: 30 mg/L — ABNORMAL HIGH (ref 0–19)

## 2019-12-04 LAB — MICROSCOPIC EXAMINATION
Bacteria, UA: NONE SEEN
RBC, Urine: NONE SEEN /hpf (ref 0–2)

## 2019-12-04 MED ORDER — PREDNISONE 10 MG PO TABS: ORAL_TABLET | ORAL | 0 refills | Status: DC

## 2019-12-04 MED ORDER — NYSTATIN 100000 UNIT/ML MT SUSP: 5.0000 mL | Freq: Four times a day (QID) | OROMUCOSAL | 0 refills | Status: DC

## 2019-12-04 MED ORDER — POTASSIUM CHLORIDE CRYS ER 20 MEQ PO TBCR
20.0000 meq | EXTENDED_RELEASE_TABLET | Freq: Every day | ORAL | 1 refills | Status: DC
Start: 2019-12-04 — End: 2020-05-29

## 2019-12-04 MED ORDER — FUROSEMIDE 40 MG PO TABS
80.0000 mg | ORAL_TABLET | Freq: Two times a day (BID) | ORAL | 1 refills | Status: DC
Start: 2019-12-04 — End: 2020-05-29

## 2019-12-04 MED ORDER — VENLAFAXINE HCL ER 150 MG PO CP24
ORAL_CAPSULE | ORAL | 1 refills | Status: DC
Start: 2019-12-04 — End: 2020-05-29

## 2019-12-04 MED ORDER — FLUTICASONE PROPIONATE 50 MCG/ACT NA SUSP
NASAL | 6 refills | Status: DC
Start: 2019-12-04 — End: 2020-05-29

## 2019-12-04 MED ORDER — APIXABAN 5 MG PO TABS
5.0000 mg | ORAL_TABLET | Freq: Two times a day (BID) | ORAL | 1 refills | Status: DC
Start: 2019-12-04 — End: 2020-05-29

## 2019-12-04 MED ORDER — PANTOPRAZOLE SODIUM 40 MG PO TBEC
40.0000 mg | DELAYED_RELEASE_TABLET | Freq: Every day | ORAL | 1 refills | Status: DC
Start: 2019-12-04 — End: 2020-05-29

## 2019-12-04 MED ORDER — VENLAFAXINE HCL ER 75 MG PO CP24
ORAL_CAPSULE | ORAL | 1 refills | Status: DC
Start: 2019-12-04 — End: 2020-05-29

## 2019-12-04 MED ORDER — HYDROXYZINE HCL 25 MG PO TABS
25.0000 mg | ORAL_TABLET | Freq: Every evening | ORAL | 1 refills | Status: DC | PRN
Start: 2019-12-04 — End: 2020-05-29

## 2019-12-04 MED ORDER — GABAPENTIN 300 MG PO CAPS
600.0000 mg | ORAL_CAPSULE | Freq: Two times a day (BID) | ORAL | 1 refills | Status: DC
Start: 2019-12-04 — End: 2020-05-29

## 2019-12-04 MED ORDER — MONTELUKAST SODIUM 10 MG PO TABS
10.0000 mg | ORAL_TABLET | Freq: Every day | ORAL | 1 refills | Status: DC
Start: 2019-12-04 — End: 2020-05-29

## 2019-12-04 MED ORDER — AMLODIPINE BESYLATE 10 MG PO TABS
10.0000 mg | ORAL_TABLET | Freq: Every day | ORAL | 1 refills | Status: DC
Start: 2019-12-04 — End: 2020-05-29

## 2019-12-04 MED ORDER — ALBUTEROL SULFATE HFA 108 (90 BASE) MCG/ACT IN AERS: 2.0000 | INHALATION_SPRAY | Freq: Four times a day (QID) | RESPIRATORY_TRACT | 6 refills | Status: DC | PRN

## 2019-12-04 MED ORDER — BACLOFEN 10 MG PO TABS
10.0000 mg | ORAL_TABLET | Freq: Three times a day (TID) | ORAL | 1 refills | Status: DC
Start: 2019-12-04 — End: 2020-05-29

## 2019-12-04 NOTE — Progress Notes (Signed)
BP 110/73 (BP Location: Left Arm, Patient Position: Sitting, Cuff Size: Large)   Pulse 72   Temp 98.2 F (36.8 C) (Oral)   Ht 5\' 3"  (1.6 m)   Wt 275 lb 12.8 oz (125.1 kg)   SpO2 97%   BMI 48.86 kg/m    Subjective:    Patient ID: Rehema Muffley, female    DOB: 09-Sep-1960, 59 y.o.   MRN: [id number]  HPI: Jonna Dittrich is a 59 y.o. female presenting on 12/04/2019 for comprehensive medical examination. Current medical complaints include:  BACK PAIN Duration: 3 days Mechanism of injury: lifting Location: Right and low back Onset: sudden Severity: severe Quality: shooting pain Frequency: constant Radiation: R leg above the knee Aggravating factors: nothing Alleviating factors: nothing Status: stable Treatments attempted: none   Relief with NSAIDs?: No NSAIDs Taken Nighttime pain:  no Paresthesias / decreased sensation:  no Bowel / bladder incontinence:  no Fevers:  no Dysuria / urinary frequency:  no  HYPOTHYROIDISM Thyroid control status: stable Satisfied with current treatment? yes Medication side effects: no Medication compliance: excellent compliance Recent dose adjustment:no Fatigue: yes Cold intolerance: no Heat intolerance: no Weight gain: no Weight loss: no Constipation: no Diarrhea/loose stools: no Palpitations: no Lower extremity edema: no Anxiety/depressed mood: no  HYPERTENSION / HYPERLIPIDEMIA Satisfied with current treatment? yes Duration of hypertension: chronic BP monitoring frequency: not checking BP medication side effects: no Duration of hyperlipidemia: chronic Cholesterol medication side effects: not on anything Cholesterol supplements: none Medication compliance: excellent compliance Aspirin: no Recent stressors: no Recurrent headaches: no Visual changes: no Palpitations: no Dyspnea: no Chest pain: no Lower extremity edema: no Dizzy/lightheaded: no  DEPRESSION Mood status: stable Satisfied with current treatment?: yes Symptom  severity: mild  Duration of current treatment : chronic Side effects: no Medication compliance: excellent compliance Psychotherapy/counseling: no  Depressed mood: no Anxious mood: no Anhedonia: no Significant weight loss or gain: no Insomnia: no  Fatigue: no Feelings of worthlessness or guilt: no Impaired concentration/indecisiveness: no Suicidal ideations: no Hopelessness: no Crying spells: no Depression screen [facility] 2/9 12/04/2019 11/26/2019 05/31/2019 11/30/2018 11/02/2018  Decreased Interest 0 0 0 2 0  Down, Depressed, Hopeless 0 0 0 0 0  PHQ - 2 Score 0 0 0 2 0  Altered sleeping 1 - 0 1 -  Tired, decreased energy 1 - 0 1 -  Change in appetite 0 - 0 0 -  Feeling bad or failure about yourself  0 - 0 0 -  Trouble concentrating 0 - 0 0 -  Moving slowly or fidgety/restless 0 - 0 0 -  Suicidal thoughts 0 - 0 0 -  PHQ-9 Score 2 - 0 4 -  Difficult doing work/chores Not difficult at all - Not difficult at all Not difficult at all -  Some recent data might be hidden    Depression Screen done today and results listed below:  Depression screen [facility] 2/9 12/04/2019 11/26/2019 05/31/2019 11/30/2018 11/02/2018  Decreased Interest 0 0 0 2 0  Down, Depressed, Hopeless 0 0 0 0 0  PHQ - 2 Score 0 0 0 2 0  Altered sleeping 1 - 0 1 -  Tired, decreased energy 1 - 0 1 -  Change in appetite 0 - 0 0 -  Feeling bad or failure about yourself  0 - 0 0 -  Trouble concentrating 0 - 0 0 -  Moving slowly or fidgety/restless 0 - 0 0 -  Suicidal thoughts 0 - 0 0 -  PHQ-9 Score 2 -  0 4 -  Difficult doing work/chores Not difficult at all - Not difficult at all Not difficult at all -  Some recent data might be hidden    Past Medical History:  Past Medical History:  Diagnosis Date  . Anticoagulant long-term use   . Anxiety   . Autoimmune hypothyroidism   . Chronic hypokalemia   . Chronic pain   . Chronic, continuous use of opioids   . Depression   . Diverticulosis   . Fatigue   . Fibromyalgia   .  Herpes labialis   . Herpes simplex   . History of pulmonary embolus (PE)   . History of pulmonary hypertension   . Hypertension   . Lupus (systemic lupus erythematosus) (HCC)   . Neuromuscular disorder (HCC)   . OSA on CPAP   . Osteoporosis   . Venous ulcers of both lower extremities (HCC) 06/17/2016  . Vitamin D deficiency     Surgical History:  Past Surgical History:  Procedure Laterality Date  . CESAREAN SECTION    . CHOLECYSTECTOMY    . COLONOSCOPY WITH PROPOFOL N/A 07/16/2016   Procedure: COLONOSCOPY WITH PROPOFOL;  Surgeon: Wyline Mood, MD;  Location: [facility] ENDOSCOPY;  Service: Endoscopy;  Laterality: N/A;  . LAPAROSCOPIC GASTRIC SLEEVE RESECTION      Medications:  Current Outpatient Medications on File Prior to Visit  Medication Sig  . acetaminophen (TYLENOL) 325 MG tablet Take by mouth as needed.   . ALPRAZolam (XANAX) 0.5 MG tablet at bedtime as needed.   Marland Kitchen azaTHIOprine (IMURAN) 50 MG tablet Take 100 mg by mouth daily.  . calcium carbonate (CALCIUM 600) 600 MG TABS tablet Take by mouth.  . cetirizine (ZYRTEC) 10 MG tablet Take 1 tablet (10 mg total) by mouth daily.  . Cholecalciferol (D3-1000) 1000 units capsule Take 1,000 Units by mouth daily. Takes 2 1000 mg per day  . hydroxychloroquine (PLAQUENIL) 200 MG tablet Take 400 mg by mouth 2 (two) times daily.   Marland Kitchen levothyroxine (SYNTHROID) 75 MCG tablet TAKE 1 TABLET(75 MCG) BY MOUTH DAILY BEFORE BREAKFAST  . Multiple Vitamin (MULTI-VITAMINS) TABS Take by mouth.  . mupirocin cream (BACTROBAN) 2 % Apply to affected area on arms/legs twice a day as needed.  . VOLTAREN 1 % GEL    No current facility-administered medications on file prior to visit.    Allergies:  Allergies  Allergen Reactions  . Penicillins Rash  . Clindamycin Nausea Only  . Ciprofloxacin Other (See Comments)  . Morphine Other (See Comments)    Social History:  Social History   Socioeconomic History  . Marital status: Legally Separated    Spouse  name: Not on file  . Number of children: Not on file  . Years of education: Not on file  . Highest education level: High school graduate  Occupational History  . Occupation: disability   Tobacco Use  . Smoking status: Never Smoker  . Smokeless tobacco: Never Used  Vaping Use  . Vaping Use: Never used  Substance and Sexual Activity  . Alcohol use: No  . Drug use: No  . Sexual activity: Yes    Birth control/protection: Post-menopausal  Other Topics Concern  . Not on file  Social History Narrative  . Not on file   Social Determinants of Health   Financial Resource Strain: Low Risk   . Difficulty of Paying Living Expenses: Not hard at all  Food Insecurity: No Food Insecurity  . Worried About Programme researcher, broadcasting/film/video in the Last Year:  Never true  . Ran Out of Food in the Last Year: Never true  Transportation Needs: No Transportation Needs  . Lack of Transportation (Medical): No  . Lack of Transportation (Non-Medical): No  Physical Activity: Insufficiently Active  . Days of Exercise per Week: 3 days  . Minutes of Exercise per Session: 30 min  Stress: No Stress Concern Present  . Feeling of Stress : Not at all  Social Connections:   . Frequency of Communication with Friends and Family: Not on file  . Frequency of Social Gatherings with Friends and Family: Not on file  . Attends Religious Services: Not on file  . Active Member of Clubs or Organizations: Not on file  . Attends BankerClub or Organization Meetings: Not on file  . Marital Status: Not on file  Intimate Partner Violence:   . Fear of Current or Ex-Partner: Not on file  . Emotionally Abused: Not on file  . Physically Abused: Not on file  . Sexually Abused: Not on file   Social History   Tobacco Use  Smoking Status Never Smoker  Smokeless Tobacco Never Used   Social History   Substance and Sexual Activity  Alcohol Use No    Family History:  Family History  Problem Relation Age of Onset  . Arthritis Mother   .  Osteoarthritis Mother   . Lupus Mother   . Hypothyroidism Mother   . Osteoarthritis Father   . Emphysema Father   . Coronary artery disease Father     Past medical history, surgical history, medications, allergies, family history and social history reviewed with patient today and changes made to appropriate areas of the chart.   Review of Systems  Constitutional: Negative.   HENT: Negative.   Eyes: Negative.        Dry eyes   Respiratory: Negative.   Cardiovascular: Negative.   Gastrointestinal: Positive for heartburn. Negative for abdominal pain, blood in stool, constipation, diarrhea, melena, nausea and vomiting.  Genitourinary: Negative.   Musculoskeletal: Negative.   Skin: Positive for rash. Negative for itching.  Neurological: Positive for weakness. Negative for dizziness, tingling, tremors, sensory change, speech change, focal weakness, seizures, loss of consciousness and headaches.       Balance issues  Endo/Heme/Allergies: Positive for environmental allergies and polydipsia. Does not bruise/bleed easily.  Psychiatric/Behavioral: Negative.     All other ROS negative except what is listed above and in the HPI.      Objective:    BP 110/73 (BP Location: Left Arm, Patient Position: Sitting, Cuff Size: Large)   Pulse 72   Temp 98.2 F (36.8 C) (Oral)   Ht 5\' 3"  (1.6 m)   Wt 275 lb 12.8 oz (125.1 kg)   SpO2 97%   BMI 48.86 kg/m   Wt Readings from Last 3 Encounters:  12/04/19 275 lb 12.8 oz (125.1 kg)  11/26/19 250 lb (113.4 kg)  02/06/19 250 lb (113.4 kg)    Physical Exam Vitals and nursing note reviewed.  Constitutional:      General: She is not in acute distress.    Appearance: Normal appearance. She is not ill-appearing, toxic-appearing or diaphoretic.  HENT:     Head: Normocephalic and atraumatic.     Right Ear: Ear canal normal. There is impacted cerumen.     Left Ear: Tympanic membrane, ear canal and external ear normal. There is no impacted cerumen.      Nose: Nose normal. No congestion or rhinorrhea.     Mouth/Throat:  Mouth: Mucous membranes are moist.     Pharynx: Oropharynx is clear. No oropharyngeal exudate or posterior oropharyngeal erythema.     Comments: Thrush on her tongue Eyes:     General: No scleral icterus.       Right eye: No discharge.        Left eye: No discharge.     Extraocular Movements: Extraocular movements intact.     Conjunctiva/sclera: Conjunctivae normal.     Pupils: Pupils are equal, round, and reactive to light.  Neck:     Vascular: No carotid bruit.  Cardiovascular:     Rate and Rhythm: Normal rate and regular rhythm.     Pulses: Normal pulses.     Heart sounds: No murmur heard.  No friction rub. No gallop.   Pulmonary:     Effort: Pulmonary effort is normal. No respiratory distress.     Breath sounds: Normal breath sounds. No stridor. No wheezing, rhonchi or rales.  Chest:     Chest wall: No tenderness.  Abdominal:     General: Abdomen is flat. Bowel sounds are normal. There is no distension.     Palpations: Abdomen is soft. There is no mass.     Tenderness: There is no abdominal tenderness. There is no right CVA tenderness, left CVA tenderness, guarding or rebound.     Hernia: No hernia is present.  Genitourinary:    Comments: Breast and pelvic exams deferred with shared decision making Musculoskeletal:        General: No swelling, tenderness, deformity or signs of injury.     Cervical back: Normal range of motion and neck supple. No rigidity. No muscular tenderness.     Right lower leg: No edema.     Left lower leg: No edema.  Lymphadenopathy:     Cervical: No cervical adenopathy.  Skin:    General: Skin is warm and dry.     Capillary Refill: Capillary refill takes less than 2 seconds.     Coloration: Skin is not jaundiced or pale.     Findings: No bruising, erythema, lesion or rash.  Neurological:     General: No focal deficit present.     Mental Status: She is alert and oriented to  person, place, and time. Mental status is at baseline.     Cranial Nerves: No cranial nerve deficit.     Sensory: No sensory deficit.     Motor: No weakness.     Coordination: Coordination normal.     Gait: Gait normal.     Deep Tendon Reflexes: Reflexes normal.  Psychiatric:        Mood and Affect: Mood normal.        Behavior: Behavior normal.        Thought Content: Thought content normal.        Judgment: Judgment normal.     Results for orders placed or performed in visit on 12/04/19  Microscopic Examination   Urine  Result Value Ref Range   WBC, UA 6-10 (A) 0 - 5 /hpf   RBC None seen 0 - 2 /hpf   Epithelial Cells (non renal) 0-10 0 - 10 /hpf   Bacteria, UA None seen None seen/Few  CBC with Differential/Platelet  Result Value Ref Range   WBC 6.6 3.4 - 10.8 x10E3/uL   RBC 4.05 3.77 - 5.28 x10E6/uL   Hemoglobin 13.2 11.1 - 15.9 g/dL   Hematocrit 04.5 40.9 - 46.6 %   MCV 97 79 - 97 fL  MCH 32.6 26.6 - 33.0 pg   MCHC 33.7 31 - 35 g/dL   RDW 16.1 09.6 - 04.5 %   Platelets 298 150 - 450 x10E3/uL   Neutrophils 60 Not Estab. %   Lymphs 32 Not Estab. %   Monocytes 7 Not Estab. %   Eos 0 Not Estab. %   Basos 0 Not Estab. %   Neutrophils Absolute 4.0 1 - 7 x10E3/uL   Lymphocytes Absolute 2.1 0 - 3 x10E3/uL   Monocytes Absolute 0.5 0 - 0 x10E3/uL   EOS (ABSOLUTE) 0.0 0.0 - 0.4 x10E3/uL   Basophils Absolute 0.0 0 - 0 x10E3/uL   Immature Granulocytes 1 Not Estab. %   Immature Grans (Abs) 0.0 0.0 - 0.1 x10E3/uL  Comprehensive metabolic panel  Result Value Ref Range   Glucose 99 65 - 99 mg/dL   BUN 11 6 - 24 mg/dL   Creatinine, Ser 4.09 0.57 - 1.00 mg/dL   GFR calc non Af Amer 84 >59 mL/min/1.73   GFR calc Af Amer 97 >59 mL/min/1.73   BUN/Creatinine Ratio 14 9 - 23   Sodium 141 134 - 144 mmol/L   Potassium 3.8 3.5 - 5.2 mmol/L   Chloride 103 96 - 106 mmol/L   CO2 25 20 - 29 mmol/L   Calcium 9.1 8.7 - 10.2 mg/dL   Total Protein 7.0 6.0 - 8.5 g/dL   Albumin 4.3 3.8 -  4.9 g/dL   Globulin, Total 2.7 1.5 - 4.5 g/dL   Albumin/Globulin Ratio 1.6 1.2 - 2.2   Bilirubin Total 0.3 0.0 - 1.2 mg/dL   Alkaline Phosphatase 110 44 - 121 IU/L   AST 31 0 - 40 IU/L   ALT 23 0 - 32 IU/L  Lipid Panel w/o Chol/HDL Ratio  Result Value Ref Range   Cholesterol, Total 241 (H) 100 - 199 mg/dL   Triglycerides 811 0 - 149 mg/dL   HDL 56 >91 mg/dL   VLDL Cholesterol Cal 19 5 - 40 mg/dL   LDL Chol Calc (NIH) 478 (H) 0 - 99 mg/dL  Microalbumin, Urine Waived  Result Value Ref Range   Microalb, Ur Waived 30 (H) 0 - 19 mg/L   Creatinine, Urine Waived 50 10 - 300 mg/dL   Microalb/Creat Ratio 30-300 (H) <30 mg/g  TSH  Result Value Ref Range   TSH 3.560 0.450 - 4.500 uIU/mL  Urinalysis, Routine w reflex microscopic  Result Value Ref Range   Specific Gravity, UA 1.020 1.005 - 1.030   pH, UA 5.5 5.0 - 7.5   Color, UA Yellow Yellow   Appearance Ur Clear Clear   Leukocytes,UA 2+ (A) Negative   Protein,UA Negative Negative/Trace   Glucose, UA Negative Negative   Ketones, UA Negative Negative   RBC, UA Negative Negative   Bilirubin, UA Negative Negative   Urobilinogen, Ur 1.0 0.2 - 1.0 mg/dL   Nitrite, UA Negative Negative   Microscopic Examination See below:   VITAMIN D 25 Hydroxy (Vit-D Deficiency, Fractures)  Result Value Ref Range   Vit D, 25-Hydroxy 25.3 (L) 30.0 - 100.0 ng/mL      Assessment & Plan:   Problem List Items Addressed This Visit      Endocrine   Autoimmune hypothyroidism    Rechecking labs today. Await results. Treat as needed.       Relevant Orders   CBC with Differential/Platelet (Completed)   Comprehensive metabolic panel (Completed)   TSH (Completed)     Genitourinary   Benign hypertensive renal  disease   Relevant Orders   CBC with Differential/Platelet (Completed)   Comprehensive metabolic panel (Completed)   Microalbumin, Urine Waived (Completed)   Urinalysis, Routine w reflex microscopic (Completed)     Other   Depression    Under  good control on current regimen. Continue current regimen. Continue to monitor. Call with any concerns. Refills given. Labs drawn today.      Relevant Medications   hydrOXYzine (ATARAX/VISTARIL) 25 MG tablet   venlafaxine XR (EFFEXOR-XR) 150 MG 24 hr capsule   venlafaxine XR (EFFEXOR-XR) 75 MG 24 hr capsule   Other Relevant Orders   CBC with Differential/Platelet (Completed)   Comprehensive metabolic panel (Completed)   Vitamin D deficiency    Rechecking labs today. Await results. Treat as needed.       Relevant Orders   CBC with Differential/Platelet (Completed)   Comprehensive metabolic panel (Completed)   VITAMIN D 25 Hydroxy (Vit-D Deficiency, Fractures) (Completed)   Mixed hyperlipidemia    Under good control on current regimen. Continue current regimen. Continue to monitor. Call with any concerns. Refills given. Labs drawn today.       Relevant Medications   amLODipine (NORVASC) 10 MG tablet   apixaban (ELIQUIS) 5 MG TABS tablet   furosemide (LASIX) 40 MG tablet   Other Relevant Orders   CBC with Differential/Platelet (Completed)   Comprehensive metabolic panel (Completed)   Lipid Panel w/o Chol/HDL Ratio (Completed)    Other Visit Diagnoses    Routine general medical examination at a health care facility    -  Primary   Vaccines up to date. Screening labs checked today. Pap up to date. Colonoscopy up to date. Continue diet and exercise. Call with any concerns.    Need for influenza vaccination       Thrush       Will treat with nystatin. Call with any concerns.    Relevant Medications   nystatin (MYCOSTATIN) 100000 UNIT/ML suspension   Hearing loss of right ear due to cerumen impaction       Will use debrox and come in for ear flush if needed.    Acute right-sided low back pain, unspecified whether sciatica present       Will work on stretches. Call if not getting better or getting worse.    Relevant Medications   baclofen (LIORESAL) 10 MG tablet   predniSONE  (DELTASONE) 10 MG tablet       Follow up plan: Return 2-3 weeks follow up back.   LABORATORY TESTING:  - Pap smear: up to date  IMMUNIZATIONS:   - Tdap: Tetanus vaccination status reviewed: last tetanus booster within 10 years. - Influenza: Administered today - Pneumovax: Up to date - Prevnar: Up to date - COVID: Up to date  SCREENING: -Mammogram: Ordered today  - Colonoscopy: Up to date   PATIENT COUNSELING:   Advised to take 1 mg of folate supplement per day if capable of pregnancy.   Sexuality: Discussed sexually transmitted diseases, partner selection, use of condoms, avoidance of unintended pregnancy  and contraceptive alternatives.   Advised to avoid cigarette smoking.  I discussed with the patient that most people either abstain from alcohol or drink within safe limits (<=14/week and <=4 drinks/occasion for males, <=7/weeks and <= 3 drinks/occasion for females) and that the risk for alcohol disorders and other health effects rises proportionally with the number of drinks per week and how often a drinker exceeds daily limits.  Discussed cessation/primary prevention of drug use and availability of  treatment for abuse.   Diet: Encouraged to adjust caloric intake to maintain  or achieve ideal body weight, to reduce intake of dietary saturated fat and total fat, to limit sodium intake by avoiding high sodium foods and not adding table salt, and to maintain adequate dietary potassium and calcium preferably from fresh fruits, vegetables, and low-fat dairy products.    stressed the importance of regular exercise  Injury prevention: Discussed safety belts, safety helmets, smoke detector, smoking near bedding or upholstery.   Dental health: Discussed importance of regular tooth brushing, flossing, and dental visits.    NEXT PREVENTATIVE PHYSICAL DUE IN 1 YEAR. Return 2-3 weeks follow up back.

## 2019-12-04 NOTE — Patient Instructions (Addendum)
Debrox ear wax remover Health Maintenance, Female Adopting a healthy lifestyle and getting preventive care are important in promoting health and wellness. Ask your health care provider about:  The right schedule for you to have regular tests and exams.  Things you can do on your own to prevent diseases and keep yourself healthy. What should I know about diet, weight, and exercise? Eat a healthy diet   Eat a diet that includes plenty of vegetables, fruits, low-fat dairy products, and lean protein.  Do not eat a lot of foods that are high in solid fats, added sugars, or sodium. Maintain a healthy weight Body mass index (BMI) is used to identify weight problems. It estimates body fat based on height and weight. Your health care provider can help determine your BMI and help you achieve or maintain a healthy weight. Get regular exercise Get regular exercise. This is one of the most important things you can do for your health. Most adults should:  Exercise for at least 150 minutes each week. The exercise should increase your heart rate and make you sweat (moderate-intensity exercise).  Do strengthening exercises at least twice a week. This is in addition to the moderate-intensity exercise.  Spend less time sitting. Even light physical activity can be beneficial. Watch cholesterol and blood lipids Have your blood tested for lipids and cholesterol at 58 years of age, then have this test every 5 years. Have your cholesterol levels checked more often if:  Your lipid or cholesterol levels are high.  You are older than 58 years of age.  You are at high risk for heart disease. What should I know about cancer screening? Depending on your health history and family history, you may need to have cancer screening at various ages. This may include screening for:  Breast cancer.  Cervical cancer.  Colorectal cancer.  Skin cancer.  Lung cancer. What should I know about heart disease,  diabetes, and high blood pressure? Blood pressure and heart disease  High blood pressure causes heart disease and increases the risk of stroke. This is more likely to develop in people who have high blood pressure readings, are of African descent, or are overweight.  Have your blood pressure checked: ? Every 3-5 years if you are 9-14 years of age. ? Every year if you are 87 years old or older. Diabetes Have regular diabetes screenings. This checks your fasting blood sugar level. Have the screening done:  Once every three years after age 32 if you are at a normal weight and have a low risk for diabetes.  More often and at a younger age if you are overweight or have a high risk for diabetes. What should I know about preventing infection? Hepatitis B If you have a higher risk for hepatitis B, you should be screened for this virus. Talk with your health care provider to find out if you are at risk for hepatitis B infection. Hepatitis C Testing is recommended for:  Everyone born from [birth year] through 1964.  Anyone with known risk factors for hepatitis C. Sexually transmitted infections (STIs)  Get screened for STIs, including gonorrhea and chlamydia, if: ? You are sexually active and are younger than 58 years of age. ? You are older than 58 years of age and your health care provider tells you that you are at risk for this type of infection. ? Your sexual activity has changed since you were last screened, and you are at increased risk for chlamydia or gonorrhea. Ask your  health care provider if you are at risk.  Ask your health care provider about whether you are at high risk for HIV. Your health care provider may recommend a prescription medicine to help prevent HIV infection. If you choose to take medicine to prevent HIV, you should first get tested for HIV. You should then be tested every 3 months for as long as you are taking the medicine. Pregnancy  If you are about to stop having your  period (premenopausal) and you may become pregnant, seek counseling before you get pregnant.  Take 400 to 800 micrograms (mcg) of folic acid every day if you become pregnant.  Ask for birth control (contraception) if you want to prevent pregnancy. Osteoporosis and menopause Osteoporosis is a disease in which the bones lose minerals and strength with aging. This can result in bone fractures. If you are 38 years old or older, or if you are at risk for osteoporosis and fractures, ask your health care provider if you should:  Be screened for bone loss.  Take a calcium or vitamin D supplement to lower your risk of fractures.  Be given hormone replacement therapy (HRT) to treat symptoms of menopause. Follow these instructions at home: Lifestyle  Do not use any products that contain nicotine or tobacco, such as cigarettes, e-cigarettes, and chewing tobacco. If you need help quitting, ask your health care provider.  Do not use street drugs.  Do not share needles.  Ask your health care provider for help if you need support or information about quitting drugs. Alcohol use  Do not drink alcohol if: ? Your health care provider tells you not to drink. ? You are pregnant, may be pregnant, or are planning to become pregnant.  If you drink alcohol: ? Limit how much you use to 0-1 drink a day. ? Limit intake if you are breastfeeding.  Be aware of how much alcohol is in your drink. In the [place] one drink equals one 12 oz bottle of beer (355 mL), one 5 oz glass of wine (148 mL), or one 1 oz glass of hard liquor (44 mL). General instructions  Schedule regular health, dental, and eye exams.  Stay current with your vaccines.  Tell your health care provider if: ? You often feel depressed. ? You have ever been abused or do not feel safe at home. Summary  Adopting a healthy lifestyle and getting preventive care are important in promoting health and wellness.  Follow your health care provider's  instructions about healthy diet, exercising, and getting tested or screened for diseases.  Follow your health care provider's instructions on monitoring your cholesterol and blood pressure. This information is not intended to replace advice given to you by your health care provider. Make sure you discuss any questions you have with your health care provider. Document Revised: 02/15/2018 Document Reviewed: 02/15/2018 Elsevier Patient Education  2020 [employer].  Low Back Sprain or Strain Rehab Ask your health care provider which exercises are safe for you. Do exercises exactly as told by your health care provider and adjust them as directed. It is normal to feel mild stretching, pulling, tightness, or discomfort as you do these exercises. Stop right away if you feel sudden pain or your pain gets worse. Do not begin these exercises until told by your health care provider. Stretching and range-of-motion exercises These exercises warm up your muscles and joints and improve the movement and flexibility of your back. These exercises also help to relieve pain, numbness, and tingling.  Lumbar rotation  1. Lie on your back on a firm surface and bend your knees. 2. Straighten your arms out to your sides so each arm forms a 90-degree angle (right angle) with a side of your body. 3. Slowly move (rotate) both of your knees to one side of your body until you feel a stretch in your lower back (lumbar). Try not to let your shoulders lift off the floor. 4. Hold this position for __________ seconds. 5. Tense your abdominal muscles and slowly move your knees back to the starting position. 6. Repeat this exercise on the other side of your body. Repeat __________ times. Complete this exercise __________ times a day. Single knee to chest  1. Lie on your back on a firm surface with both legs straight. 2. Bend one of your knees. Use your hands to move your knee up toward your chest until you feel a gentle stretch  in your lower back and buttock. ? Hold your leg in this position by holding on to the front of your knee. ? Keep your other leg as straight as possible. 3. Hold this position for __________ seconds. 4. Slowly return to the starting position. 5. Repeat with your other leg. Repeat __________ times. Complete this exercise __________ times a day. Prone extension on elbows  1. Lie on your abdomen on a firm surface (prone position). 2. Prop yourself up on your elbows. 3. Use your arms to help lift your chest up until you feel a gentle stretch in your abdomen and your lower back. ? This will place some of your body weight on your elbows. If this is uncomfortable, try stacking pillows under your chest. ? Your hips should stay down, against the surface that you are lying on. Keep your hip and back muscles relaxed. 4. Hold this position for __________ seconds. 5. Slowly relax your upper body and return to the starting position. Repeat __________ times. Complete this exercise __________ times a day. Strengthening exercises These exercises build strength and endurance in your back. Endurance is the ability to use your muscles for a long time, even after they get tired. Pelvic tilt This exercise strengthens the muscles that lie deep in the abdomen. 1. Lie on your back on a firm surface. Bend your knees and keep your feet flat on the floor. 2. Tense your abdominal muscles. Tip your pelvis up toward the ceiling and flatten your lower back into the floor. ? To help with this exercise, you may place a small towel under your lower back and try to push your back into the towel. 3. Hold this position for __________ seconds. 4. Let your muscles relax completely before you repeat this exercise. Repeat __________ times. Complete this exercise __________ times a day. Alternating arm and leg raises  1. Get on your hands and knees on a firm surface. If you are on a hard floor, you may want to use padding, such as  an exercise mat, to cushion your knees. 2. Line up your arms and legs. Your hands should be directly below your shoulders, and your knees should be directly below your hips. 3. Lift your left leg behind you. At the same time, raise your right arm and straighten it in front of you. ? Do not lift your leg higher than your hip. ? Do not lift your arm higher than your shoulder. ? Keep your abdominal and back muscles tight. ? Keep your hips facing the ground. ? Do not arch your back. ? Keep  your balance carefully, and do not hold your breath. 4. Hold this position for __________ seconds. 5. Slowly return to the starting position. 6. Repeat with your right leg and your left arm. Repeat __________ times. Complete this exercise __________ times a day. Abdominal set with straight leg raise  1. Lie on your back on a firm surface. 2. Bend one of your knees and keep your other leg straight. 3. Tense your abdominal muscles and lift your straight leg up, 4-6 inches (10-15 cm) off the ground. 4. Keep your abdominal muscles tight and hold this position for __________ seconds. ? Do not hold your breath. ? Do not arch your back. Keep it flat against the ground. 5. Keep your abdominal muscles tense as you slowly lower your leg back to the starting position. 6. Repeat with your other leg. Repeat __________ times. Complete this exercise __________ times a day. Single leg lower with bent knees 1. Lie on your back on a firm surface. 2. Tense your abdominal muscles and lift your feet off the floor, one foot at a time, so your knees and hips are bent in 90-degree angles (right angles). ? Your knees should be over your hips and your lower legs should be parallel to the floor. 3. Keeping your abdominal muscles tense and your knee bent, slowly lower one of your legs so your toe touches the ground. 4. Lift your leg back up to return to the starting position. ? Do not hold your breath. ? Do not let your back arch.  Keep your back flat against the ground. 5. Repeat with your other leg. Repeat __________ times. Complete this exercise __________ times a day. Posture and body mechanics Good posture and healthy body mechanics can help to relieve stress in your body's tissues and joints. Body mechanics refers to the movements and positions of your body while you do your daily activities. Posture is part of body mechanics. Good posture means:  Your spine is in its natural S-curve position (neutral).  Your shoulders are pulled back slightly.  Your head is not tipped forward. Follow these guidelines to improve your posture and body mechanics in your everyday activities. Standing   When standing, keep your spine neutral and your feet about hip width apart. Keep a slight bend in your knees. Your ears, shoulders, and hips should line up.  When you do a task in which you stand in one place for a long time, place one foot up on a stable object that is 2-4 inches (5-10 cm) high, such as a footstool. This helps keep your spine neutral. Sitting   When sitting, keep your spine neutral and keep your feet flat on the floor. Use a footrest, if necessary, and keep your thighs parallel to the floor. Avoid rounding your shoulders, and avoid tilting your head forward.  When working at a desk or a computer, keep your desk at a height where your hands are slightly lower than your elbows. Slide your chair under your desk so you are close enough to maintain good posture.  When working at a computer, place your monitor at a height where you are looking straight ahead and you do not have to tilt your head forward or downward to look at the screen. Resting  When lying down and resting, avoid positions that are most painful for you.  If you have pain with activities such as sitting, bending, stooping, or squatting, lie in a position in which your body does not bend very much.  For example, avoid curling up on your side with your  arms and knees near your chest (fetal position).  If you have pain with activities such as standing for a long time or reaching with your arms, lie with your spine in a neutral position and bend your knees slightly. Try the following positions: ? Lying on your side with a pillow between your knees. ? Lying on your back with a pillow under your knees. Lifting   When lifting objects, keep your feet at least shoulder width apart and tighten your abdominal muscles.  Bend your knees and hips and keep your spine neutral. It is important to lift using the strength of your legs, not your back. Do not lock your knees straight out.  Always ask for help to lift heavy or awkward objects. This information is not intended to replace advice given to you by your health care provider. Make sure you discuss any questions you have with your health care provider. Document Revised: 06/16/2018 Document Reviewed: 03/16/2018 Elsevier Patient Education  2020 [employer].

## 2019-12-05 LAB — COMPREHENSIVE METABOLIC PANEL
ALT: 23 IU/L (ref 0–32)
AST: 31 IU/L (ref 0–40)
Albumin/Globulin Ratio: 1.6 (ref 1.2–2.2)
Albumin: 4.3 g/dL (ref 3.8–4.9)
Alkaline Phosphatase: 110 IU/L (ref 44–121)
BUN/Creatinine Ratio: 14 (ref 9–23)
BUN: 11 mg/dL (ref 6–24)
Bilirubin Total: 0.3 mg/dL (ref 0.0–1.2)
CO2: 25 mmol/L (ref 20–29)
Calcium: 9.1 mg/dL (ref 8.7–10.2)
Chloride: 103 mmol/L (ref 96–106)
Creatinine, Ser: 0.78 mg/dL (ref 0.57–1.00)
GFR calc Af Amer: 97 mL/min/{1.73_m2} (ref >59)
GFR calc non Af Amer: 84 mL/min/{1.73_m2} (ref >59)
Globulin, Total: 2.7 g/dL (ref 1.5–4.5)
Glucose: 99 mg/dL (ref 65–99)
Potassium: 3.8 mmol/L (ref 3.5–5.2)
Sodium: 141 mmol/L (ref 134–144)
Total Protein: 7 g/dL (ref 6.0–8.5)

## 2019-12-05 LAB — CBC WITH DIFFERENTIAL/PLATELET
Basophils Absolute: 0 10*3/uL (ref 0.0–0.2)
Basos: 0 %
EOS (ABSOLUTE): 0 10*3/uL (ref 0.0–0.4)
Eos: 0 %
Hematocrit: 39.2 % (ref 34.0–46.6)
Hemoglobin: 13.2 g/dL (ref 11.1–15.9)
Immature Grans (Abs): 0 10*3/uL (ref 0.0–0.1)
Immature Granulocytes: 1 %
Lymphocytes Absolute: 2.1 10*3/uL (ref 0.7–3.1)
Lymphs: 32 %
MCH: 32.6 pg (ref 26.6–33.0)
MCHC: 33.7 g/dL (ref 31.5–35.7)
MCV: 97 fL (ref 79–97)
Monocytes Absolute: 0.5 10*3/uL (ref 0.1–0.9)
Monocytes: 7 %
Neutrophils Absolute: 4 10*3/uL (ref 1.4–7.0)
Neutrophils: 60 %
Platelets: 298 10*3/uL (ref 150–450)
RBC: 4.05 x10E6/uL (ref 3.77–5.28)
RDW: 12.1 % (ref 11.7–15.4)
WBC: 6.6 10*3/uL (ref 3.4–10.8)

## 2019-12-05 LAB — LIPID PANEL W/O CHOL/HDL RATIO
Cholesterol, Total: 241 mg/dL — ABNORMAL HIGH (ref 100–199)
HDL: 56 mg/dL (ref >39)
LDL Chol Calc (NIH): 166 mg/dL — ABNORMAL HIGH (ref 0–99)
Triglycerides: 107 mg/dL (ref 0–149)
VLDL Cholesterol Cal: 19 mg/dL (ref 5–40)

## 2019-12-05 LAB — VITAMIN D 25 HYDROXY (VIT D DEFICIENCY, FRACTURES): Vit D, 25-Hydroxy: 25.3 ng/mL — ABNORMAL LOW (ref 30.0–100.0)

## 2019-12-05 LAB — TSH: TSH: 3.56 u[IU]/mL (ref 0.450–4.500)

## 2019-12-07 ENCOUNTER — Encounter: Payer: Self-pay | Admitting: Family Medicine

## 2019-12-07 NOTE — Assessment & Plan Note (Signed)
Under good control on current regimen. Continue current regimen. Continue to monitor. Call with any concerns. Refills given. Labs drawn today.   

## 2019-12-07 NOTE — Assessment & Plan Note (Signed)
Rechecking labs today. Await results. Treat as needed.  °

## 2019-12-13 ENCOUNTER — Other Ambulatory Visit: Payer: Self-pay | Admitting: Family Medicine

## 2019-12-13 MED ORDER — NYSTATIN 100000 UNIT/ML MT SUSP: 5.0000 mL | Freq: Four times a day (QID) | OROMUCOSAL | 0 refills | Status: DC

## 2019-12-13 NOTE — Telephone Encounter (Signed)
Pt called and stated she is out the mouth wash that was prescribed for thrash/ Pt still has trash and wanted to know if she can have a refill but doesn't know the name of it/ please advise

## 2019-12-17 ENCOUNTER — Ambulatory Visit: Payer: Self-pay | Admitting: *Deleted

## 2019-12-17 NOTE — Telephone Encounter (Signed)
Patient making follow up call on requested Rx- verified it was sent to pharmacy- but was sent to mail order. She is going to check when expected delivery is and call back if not today.  Reason for Disposition . [1] Prescription prescribed recently is not at pharmacy AND [2] triager has access to patient's EMR AND [3] prescription is recorded in the EMR  Answer Assessment - Initial Assessment Questions 1. DRUG NAME: "What medicine do you need to have refilled?"     Mouthwash for thrush 2. REFILLS REMAINING: "How many refills are remaining?" (Note: The label on the medicine or pill bottle will show how many refills are remaining. If there are no refills remaining, then a renewal may be needed.)     none 3. EXPIRATION DATE: "What is the expiration date?" (Note: The label states when the prescription will expire, and thus can no longer be refilled.)     n/a 4. PRESCRIBING HCP: "Who prescribed it?" Reason: If prescribed by specialist, call should be referred to that group.     PCP 5. SYMPTOMS: "Do you have any symptoms?"     Thrush on tongue 6. PREGNANCY: "Is there any chance that you are pregnant?" "When was your last menstrual period?"     n/a  Protocols used: MEDICATION REFILL AND RENEWAL CALL-A-AH

## 2020-01-01 ENCOUNTER — Ambulatory Visit (INDEPENDENT_AMBULATORY_CARE_PROVIDER_SITE_OTHER): Payer: Medicare HMO | Admitting: Family Medicine

## 2020-01-01 ENCOUNTER — Other Ambulatory Visit: Payer: Self-pay

## 2020-01-01 ENCOUNTER — Encounter: Payer: Self-pay | Admitting: Family Medicine

## 2020-01-01 VITALS — BP 123/85 | HR 72 | Temp 98.6°F | Ht 63.0 in | Wt 271.0 lb

## 2020-01-01 DIAGNOSIS — L239 Allergic contact dermatitis, unspecified cause: Secondary | ICD-10-CM

## 2020-01-01 DIAGNOSIS — H6121 Impacted cerumen, right ear: Secondary | ICD-10-CM

## 2020-01-01 DIAGNOSIS — E782 Mixed hyperlipidemia: Secondary | ICD-10-CM

## 2020-01-01 MED ORDER — TRIAMCINOLONE ACETONIDE 0.1 % EX CREA: 1.0000 "application " | TOPICAL_CREAM | Freq: Two times a day (BID) | CUTANEOUS | 0 refills | Status: DC

## 2020-01-01 MED ORDER — ATORVASTATIN CALCIUM 40 MG PO TABS: 40.0000 mg | ORAL_TABLET | Freq: Every day | ORAL | 1 refills | Status: DC

## 2020-01-01 NOTE — Assessment & Plan Note (Signed)
Will start her on atorvastatin and recheck 5 months. Call with any concerns.

## 2020-01-01 NOTE — Progress Notes (Signed)
BP 123/85    Pulse 72    Temp 98.6 F (37 C) (Oral)    Ht 5\' 3"  (1.6 m)    Wt 271 lb (122.9 kg)    SpO2 98%    BMI 48.01 kg/m    Subjective:    Patient ID: Kaylee Fox, female    DOB: September 07, 1960, 59 y.o.   MRN: [id number]  HPI: Kaylee Fox is a 59 y.o. female  Chief Complaint  Patient presents with   Follow-up    Thrush and Wax impactment 2-3 weeks ago. Using mouth wash - getting better slowly. Both ears still hurt.    Back Pain   Back is feeling better. Baclofen and stretches seemed like it helped. Back is doing better.   RASH Duration:  A couple of weeks  Location: perioral  Itching: yes Burning: yes Redness: yes Oozing: no Scaling: no Blisters: no Painful: no Fevers: no Change in detergents/soaps/personal care products: no Recent illness: no Recent travel:no History of same: no Context: stable Alleviating factors: nothing Treatments attempted:nothing Shortness of breath: no  Throat/tongue swelling: no Myalgias/arthralgias: no   HYPERLIPIDEMIA Hyperlipidemia status: uncontrolled Satisfied with current treatment?  no Side effects:  Not on anything Past cholesterol meds: none Supplements: none Aspirin:  yes The 10-year ASCVD risk score 41 DC Jr., et al., 2013) is: 3.9%   Values used to calculate the score:     Age: 57 years     Sex: Female     Is Non-Hispanic African American: No     Diabetic: No     Tobacco smoker: No     Systolic Blood Pressure: 123 mmHg     Is BP treated: Yes     HDL Cholesterol: 56 mg/dL     Total Cholesterol: 241 mg/dL Chest pain:  no Coronary artery disease:  no  Relevant past medical, surgical, family and social history reviewed and updated as indicated. Interim medical history since our last visit reviewed. Allergies and medications reviewed and updated.  Review of Systems  Constitutional: Positive for fatigue. Negative for activity change, appetite change, chills, diaphoresis, fever and unexpected weight change.    HENT: Positive for congestion. Negative for dental problem, drooling, ear discharge, ear pain, facial swelling, hearing loss, mouth sores, nosebleeds, postnasal drip, rhinorrhea, sinus pressure, sinus pain, sneezing, sore throat, tinnitus, trouble swallowing and voice change.   Respiratory: Negative.   Cardiovascular: Negative.   Gastrointestinal: Negative.   Musculoskeletal: Negative.   Skin: Positive for rash. Negative for color change, pallor and wound.  Psychiatric/Behavioral: Negative.     Per HPI unless specifically indicated above     Objective:    BP 123/85    Pulse 72    Temp 98.6 F (37 C) (Oral)    Ht 5\' 3"  (1.6 m)    Wt 271 lb (122.9 kg)    SpO2 98%    BMI 48.01 kg/m   Wt Readings from Last 3 Encounters:  01/01/20 271 lb (122.9 kg)  12/04/19 275 lb 12.8 oz (125.1 kg)  11/26/19 250 lb (113.4 kg)    Physical Exam Vitals and nursing note reviewed.  Constitutional:      General: She is not in acute distress.    Appearance: Normal appearance. She is obese. She is not ill-appearing, toxic-appearing or diaphoretic.  HENT:     Head: Normocephalic and atraumatic.     Right Ear: External ear normal.     Left Ear: External ear normal.     Nose: Nose normal.  Mouth/Throat:     Mouth: Mucous membranes are moist.     Pharynx: Oropharynx is clear.  Eyes:     General: No scleral icterus.       Right eye: No discharge.        Left eye: No discharge.     Extraocular Movements: Extraocular movements intact.     Conjunctiva/sclera: Conjunctivae normal.     Pupils: Pupils are equal, round, and reactive to light.  Cardiovascular:     Rate and Rhythm: Normal rate and regular rhythm.     Pulses: Normal pulses.     Heart sounds: Normal heart sounds. No murmur heard.  No friction rub. No gallop.   Pulmonary:     Effort: Pulmonary effort is normal. No respiratory distress.     Breath sounds: Normal breath sounds. No stridor. No wheezing, rhonchi or rales.  Chest:     Chest  wall: No tenderness.  Musculoskeletal:        General: Normal range of motion.     Cervical back: Normal range of motion and neck supple.  Skin:    General: Skin is warm and dry.     Capillary Refill: Capillary refill takes less than 2 seconds.     Coloration: Skin is not jaundiced or pale.     Findings: Rash (erythematous rash with pustules around mouth) present. No bruising, erythema or lesion.  Neurological:     General: No focal deficit present.     Mental Status: She is alert and oriented to person, place, and time. Mental status is at baseline.  Psychiatric:        Mood and Affect: Mood normal.        Behavior: Behavior normal.        Thought Content: Thought content normal.        Judgment: Judgment normal.     Results for orders placed or performed in visit on 12/04/19  Microscopic Examination   Urine  Result Value Ref Range   WBC, UA 6-10 (A) 0 - 5 /hpf   RBC None seen 0 - 2 /hpf   Epithelial Cells (non renal) 0-10 0 - 10 /hpf   Bacteria, UA None seen None seen/Few  CBC with Differential/Platelet  Result Value Ref Range   WBC 6.6 3.4 - 10.8 x10E3/uL   RBC 4.05 3.77 - 5.28 x10E6/uL   Hemoglobin 13.2 11.1 - 15.9 g/dL   Hematocrit 81.8 29.9 - 46.6 %   MCV 97 79 - 97 fL   MCH 32.6 26.6 - 33.0 pg   MCHC 33.7 31 - 35 g/dL   RDW 37.1 69.6 - 78.9 %   Platelets 298 150 - 450 x10E3/uL   Neutrophils 60 Not Estab. %   Lymphs 32 Not Estab. %   Monocytes 7 Not Estab. %   Eos 0 Not Estab. %   Basos 0 Not Estab. %   Neutrophils Absolute 4.0 1.40 - 7.00 x10E3/uL   Lymphocytes Absolute 2.1 0 - 3 x10E3/uL   Monocytes Absolute 0.5 0 - 0 x10E3/uL   EOS (ABSOLUTE) 0.0 0.0 - 0.4 x10E3/uL   Basophils Absolute 0.0 0 - 0 x10E3/uL   Immature Granulocytes 1 Not Estab. %   Immature Grans (Abs) 0.0 0.0 - 0.1 x10E3/uL  Comprehensive metabolic panel  Result Value Ref Range   Glucose 99 65 - 99 mg/dL   BUN 11 6 - 24 mg/dL   Creatinine, Ser 3.81 0.57 - 1.00 mg/dL   GFR calc non Af Denyse Dago  84 >59 mL/min/1.73   GFR calc Af Amer 97 >59 mL/min/1.73   BUN/Creatinine Ratio 14 9 - 23   Sodium 141 134 - 144 mmol/L   Potassium 3.8 3.5 - 5.2 mmol/L   Chloride 103 96 - 106 mmol/L   CO2 25 20 - 29 mmol/L   Calcium 9.1 8.7 - 10.2 mg/dL   Total Protein 7.0 6.0 - 8.5 g/dL   Albumin 4.3 3.8 - 4.9 g/dL   Globulin, Total 2.7 1.5 - 4.5 g/dL   Albumin/Globulin Ratio 1.6 1.2 - 2.2   Bilirubin Total 0.3 0.0 - 1.2 mg/dL   Alkaline Phosphatase 110 44 - 121 IU/L   AST 31 0 - 40 IU/L   ALT 23 0 - 32 IU/L  Lipid Panel w/o Chol/HDL Ratio  Result Value Ref Range   Cholesterol, Total 241 (H) 100 - 199 mg/dL   Triglycerides 725 0 - 149 mg/dL   HDL 56 >36 mg/dL   VLDL Cholesterol Cal 19 5 - 40 mg/dL   LDL Chol Calc (NIH) 644 (H) 0 - 99 mg/dL  Microalbumin, Urine Waived  Result Value Ref Range   Microalb, Ur Waived 30 (H) 0 - 19 mg/L   Creatinine, Urine Waived 50 10 - 300 mg/dL   Microalb/Creat Ratio 30-300 (H) <30 mg/g  TSH  Result Value Ref Range   TSH 3.560 0.450 - 4.500 uIU/mL  Urinalysis, Routine w reflex microscopic  Result Value Ref Range   Specific Gravity, UA 1.020 1.005 - 1.030   pH, UA 5.5 5.0 - 7.5   Color, UA Yellow Yellow   Appearance Ur Clear Clear   Leukocytes,UA 2+ (A) Negative   Protein,UA Negative Negative/Trace   Glucose, UA Negative Negative   Ketones, UA Negative Negative   RBC, UA Negative Negative   Bilirubin, UA Negative Negative   Urobilinogen, Ur 1.0 0.2 - 1.0 mg/dL   Nitrite, UA Negative Negative   Microscopic Examination See below:   VITAMIN D 25 Hydroxy (Vit-D Deficiency, Fractures)  Result Value Ref Range   Vit D, 25-Hydroxy 25.3 (L) 30.0 - 100.0 ng/mL      Assessment & Plan:   Problem List Items Addressed This Visit      Other   Mixed hyperlipidemia    Will start her on atorvastatin and recheck 5 months. Call with any concerns.       Relevant Medications   atorvastatin (LIPITOR) 40 MG tablet    Other Visit Diagnoses    Allergic contact  dermatitis, unspecified trigger    -  Primary   Will treat with triamcinalone. Call if not getting better or getting worse. Continue to monitor.    Hearing loss of right ear due to cerumen impaction       Ear flushed today with fair results- continues with wax in her ear. Will use debrox at home daily for 5 days and return next week for ear flush.       Follow up plan: Return in about 5 months (around 05/31/2020).

## 2020-01-09 ENCOUNTER — Ambulatory Visit: Payer: Medicare HMO

## 2020-01-14 ENCOUNTER — Ambulatory Visit (INDEPENDENT_AMBULATORY_CARE_PROVIDER_SITE_OTHER): Payer: Medicare HMO

## 2020-01-14 ENCOUNTER — Other Ambulatory Visit: Payer: Self-pay

## 2020-01-14 DIAGNOSIS — H6121 Impacted cerumen, right ear: Secondary | ICD-10-CM | POA: Diagnosis not present

## 2020-01-14 NOTE — Progress Notes (Signed)
Patient came in for ear flush after using debrox at home. Patient tolerated the flushing well at first but states that it became more sore. Checked in patients ear, could still see wax but also noticed some bleeding to the left inside the ear canal. Tried using the tool to scrape some of the wax out but patient did not tolerate this well and stated that it was very sore. Called Dr. Laural Benes in to check patient's ear as well. Scrape noted on the inside of the canal making the bleeding occur. Patient was advised by Dr. Laural Benes to continue Debrox drops at home for one more week and if a good amount of wax did not come out to let us know to initiate referral to ENT. Patient verbalized understanding.

## 2020-01-22 ENCOUNTER — Other Ambulatory Visit: Payer: Self-pay | Admitting: Family Medicine

## 2020-01-22 DIAGNOSIS — Z1231 Encounter for screening mammogram for malignant neoplasm of breast: Secondary | ICD-10-CM

## 2020-02-01 ENCOUNTER — Other Ambulatory Visit: Payer: Self-pay | Admitting: Family Medicine

## 2020-02-04 ENCOUNTER — Telehealth: Payer: Self-pay

## 2020-02-04 DIAGNOSIS — L239 Allergic contact dermatitis, unspecified cause: Secondary | ICD-10-CM

## 2020-02-04 NOTE — Telephone Encounter (Signed)
Has not been seen in a month. Can go to derm or have follow up

## 2020-02-04 NOTE — Telephone Encounter (Signed)
Please advise lase appt 10/26  Copied from CRM #[id number]. Topic: General - Other >> Feb 04, 2020  2:54 PM Lyn Hollingshead D wrote: PT call to let Aundra Millet know this medication is not working / triamcinolone cream (KENALOG) 0.1 % [[id number]] please advise

## 2020-02-04 NOTE — Telephone Encounter (Signed)
Derm

## 2020-03-10 ENCOUNTER — Ambulatory Visit: Payer: Self-pay | Admitting: *Deleted

## 2020-03-10 NOTE — Telephone Encounter (Signed)
Patient is calling to report that she has been having dizziness that makes her feel woozy. Patient states it has been going on for over 1 week- she did report this at last visit with PCP. Call to office- virtual appointment scheduled due to other symptoms.  Reason for Disposition . [1] MILD dizziness (e.g., walking normally) AND [2] has NOT been evaluated by physician for this  (Exception: dizziness caused by heat exposure, sudden standing, or poor fluid intake)  Answer Assessment - Initial Assessment Questions 1. DESCRIPTION: "Describe your dizziness."     Has to go slow- fatigued 2. LIGHTHEADED: "Do you feel lightheaded?" (e.g., somewhat faint, woozy, weak upon standing)     woozy 3. VERTIGO: "Do you feel like either you or the room is spinning or tilting?" (i.e. vertigo)     no 4. SEVERITY: "How bad is it?"  "Do you feel like you are going to faint?" "Can you stand and walk?"   - MILD: Feels slightly dizzy, but walking normally.   - MODERATE: Feels very unsteady when walking, but not falling; interferes with normal activities (e.g., school, work) .   - SEVERE: Unable to walk without falling, or requires assistance to walk without falling; feels like passing out now.      Balance has been off- has fallen twice in the last month 5. ONSET:  "When did the dizziness begin?"     Last week 6. AGGRAVATING FACTORS: "Does anything make it worse?" (e.g., standing, change in head position)     no 7. HEART RATE: "Can you tell me your heart rate?" "How many beats in 15 seconds?"  (Note: not all patients can do this)       Heart rate goes up and down 8. CAUSE: "What do you think is causing the dizziness?"     unknown 9. RECURRENT SYMPTOM: "Have you had dizziness before?" If Yes, ask: "When was the last time?" "What happened that time?"     Last time seen patient reported it- not sure 10. OTHER SYMPTOMS: "Do you have any other symptoms?" (e.g., fever, chest pain, vomiting, diarrhea, bleeding)        Diarrhea, stomach ache, headache, cough- since last week 11. PREGNANCY: "Is there any chance you are pregnant?" "When was your last menstrual period?"       n/a  Protocols used: DIZZINESS [facility]

## 2020-03-11 ENCOUNTER — Telehealth: Payer: Medicare HMO | Admitting: Family Medicine

## 2020-03-24 ENCOUNTER — Ambulatory Visit: Payer: Self-pay | Admitting: *Deleted

## 2020-03-24 NOTE — Telephone Encounter (Signed)
Pt called in co left knee pain just below and in front of the left knee.   It hurts to walk.    I can walk but I'm limping.   Denies falls, injuries, accidents.   "I do have fibromyalgia".   "I don't think it's that".  I made her an appt with Gabriel Cirri, NP for Jan. 20, 2022 at 1:00.   Covid questionnaire completed cleared for in office visit.    Reason for Disposition . [1] Fluid-filled sack just below knee cap  (i.e., inferior aspect of the anterior knee) AND [2] no fever or redness  Answer Assessment - Initial Assessment Questions 1. LOCATION and RADIATION: "Where is the pain located?"      Left knee is hurting.    2. QUALITY: "What does the pain feel like?"  (e.g., sharp, dull, aching, burning)     I doing something in my bedroom and it started hurting. 3. SEVERITY: "How bad is the pain?" "What does it keep you from doing?"   (Scale 1-10; or mild, moderate, severe)   -  MILD (1-3): doesn't interfere with normal activities    -  MODERATE (4-7): interferes with normal activities (e.g., work or school) or awakens from sleep, limping    -  SEVERE (8-10): excruciating pain, unable to do any normal activities, unable to walk     I'm limping when I walk. 4. ONSET: "When did the pain start?" "Does it come and go, or is it there all the time?"     Yesterday    She has fibromyalgia.   5. RECURRENT: "Have you had this pain before?" If Yes, ask: "When, and what happened then?"     It's swollen below the knee down to ankle and foot.   Never had this before. 6. SETTING: "Has there been any recent work, exercise or other activity that involved that part of the body?"      No 7. AGGRAVATING FACTORS: "What makes the knee pain worse?" (e.g., walking, climbing stairs, running)     Elevated makes it feel better it also helps with the swelling. I take fluid pills.    Taking them daily. 8. ASSOCIATED SYMPTOMS: "Is there any swelling or redness of the knee?"     No redness or bruising or open  wounds. 9. OTHER SYMPTOMS: "Do you have any other symptoms?" (e.g., chest pain, difficulty breathing, fever, calf pain)     No shortness of breath or chest pain. I've had 2 blood clots in my lungs 1999.    I'm on blood thinners for the rest of my life. 10. PREGNANCY: "Is there any chance you are pregnant?" "When was your last menstrual period?"       N/A due to age  Protocols used: KNEE PAIN-A-AH

## 2020-03-27 ENCOUNTER — Encounter: Payer: Self-pay | Admitting: Unknown Physician Specialty

## 2020-03-27 ENCOUNTER — Ambulatory Visit (INDEPENDENT_AMBULATORY_CARE_PROVIDER_SITE_OTHER): Payer: Medicare HMO | Admitting: Unknown Physician Specialty

## 2020-03-27 ENCOUNTER — Other Ambulatory Visit: Payer: Self-pay

## 2020-03-27 VITALS — BP 130/79 | HR 75 | Temp 98.4°F | Wt 270.0 lb

## 2020-03-27 DIAGNOSIS — M25562 Pain in left knee: Secondary | ICD-10-CM

## 2020-03-27 DIAGNOSIS — G8929 Other chronic pain: Secondary | ICD-10-CM | POA: Diagnosis not present

## 2020-03-27 NOTE — Patient Instructions (Addendum)
Chronic Knee Pain, Adult Chronic knee pain is pain in one or both knees that lasts longer than 3 months. Symptoms of chronic knee pain may include swelling, stiffness, and discomfort. Age-related wear and tear (osteoarthritis) of the knee joint is the most common cause of chronic knee pain. Other possible causes include:  A long-term immune-related disease that causes inflammation of the knee (rheumatoid arthritis). This usually affects both knees.  Inflammatory arthritis, such as gout or pseudogout.  An injury to the knee that causes arthritis.  An injury to the knee that damages the ligaments. Ligaments are strong tissues that connect bones to each other.  Runner's knee or pain behind the kneecap. Treatment for chronic knee pain depends on the cause. The main treatments for chronic knee pain are physical therapy and weight loss. This condition may also be treated with medicines, injections, a knee sleeve or brace, and by using crutches. Rest, ice, pressure (compression), and elevation, also known as RICE therapy, may also be recommended. Follow these instructions at home: If you have a knee sleeve or brace:  Wear the knee sleeve or brace as told by your health care provider. Remove it only as told by your health care provider.  Loosen it if your toes tingle, become numb, or turn cold and blue.  Keep it clean.  If the sleeve or brace is not waterproof: ? Do not let it get wet. ? Remove it if allowed by your health care provider, or cover it with a watertight covering when you take a bath or a shower.   Managing pain, stiffness, and swelling  If directed, apply heat to the affected area as often as told by your health care provider. Use the heat source that your health care provider recommends, such as a moist heat pack or a heating pad. ? If you have a removable knee sleeve or brace, remove it as told by your health care provider. ? Place a towel between your skin and the heat  source. ? Leave the heat on for 20-30 minutes. ? Remove the heat if your skin turns bright red. This is especially important if you are unable to feel pain, heat, or cold. You may have a greater risk of getting burned.  If directed, put ice on the affected area. To do this: ? If you have a removable knee sleeve or brace, remove it as told by your health care provider. ? Put ice in a plastic bag. ? Place a towel between your skin and the bag. ? Leave the ice on for 20 minutes, 2-3 times a day. ? Remove the ice if your skin turns bright red. This is very important. If you cannot feel pain, heat, or cold, you have a greater risk of damage to the area.  Move your toes often to reduce stiffness and swelling.  Raise (elevate) the injured area above the level of your heart while you are sitting or lying down.      Activity  Avoid high-impact activities or exercises, such as running, jumping rope, or doing jumping jacks.  Follow the exercise plan that your health care provider designed for you. Your health care provider may suggest that you: ? Avoid activities that make knee pain worse. This may require you to change your exercise routines, sport participation, or job duties. ? Wear shoes with cushioned soles. ? Avoid sports that require running and sudden changes in direction. ? Do physical therapy. Physical therapy is planned to match your needs and abilities.   It may include exercises for strength, flexibility, stability, and endurance. ? Do exercises that increase balance and strength, such as tai chi and yoga.  Do not use the injured limb to support your body weight until your health care provider says that you can. Use crutches as told by your health care provider.  Return to your normal activities as told by your health care provider. Ask your health care provider what activities are safe for you. General instructions  Take over-the-counter and prescription medicines only as told by  your health care provider.  Lose weight if you are overweight. Losing even a little weight can reduce knee pain. Ask your health care provider what your ideal weight is, and how to safely lose extra weight. A dietitian may be able to help you plan your meals.  Do not use any products that contain nicotine or tobacco, such as cigarettes, e-cigarettes, and chewing tobacco. These can delay healing. If you need help quitting, ask your health care provider.  Keep all follow-up visits. This is important. Contact a health care provider if:  You have knee pain that is not getting better or gets worse.  You are unable to do your physical therapy exercises due to knee pain. Get help right away if:  Your knee swells and the swelling becomes worse.  You cannot move your knee.  You have severe knee pain. Summary  Knee pain that lasts more than 3 months is considered chronic knee pain.  The main treatments for chronic knee pain are physical therapy and weight loss. You may also need to take medicines, wear a knee sleeve or brace, use crutches, and apply ice or heat.  Losing even a little weight can reduce knee pain. Ask your health care provider what your ideal weight is, and how to safely lose extra weight. A dietitian may be able to help you plan your meals.  Follow the exercise plan that your health care provider designed for you.   Tart cherry juice Tumeric

## 2020-03-27 NOTE — Progress Notes (Signed)
BP 130/79   Pulse 75   Temp 98.4 F (36.9 C)   Wt 270 lb (122.5 kg)   SpO2 96%   BMI 47.83 kg/m    Subjective:    Patient ID: Kaylee Fox, female    DOB: 1961/01/21, 60 y.o.   MRN: [id number]  HPI: Wrenly Lauritsen is a 60 y.o. female  Chief Complaint  Patient presents with  . Knee Pain    Left, starting hurting about two weeks, has gotten worse since Sunday   Knee Pain  There was no injury mechanism ("flew on me" 4 days ago). The pain is present in the left knee. The pain is severe. The pain has been fluctuating since onset. Pertinent negatives include no inability to bear weight, loss of motion, loss of sensation, muscle weakness, numbness or tingling. Associated symptoms comments: Able to weight bear but more pain. She reports no foreign bodies present. The symptoms are aggravated by weight bearing. She has tried nothing for the symptoms.     Relevant past medical, surgical, family and social history reviewed and updated as indicated. Interim medical history since our last visit reviewed. Allergies and medications reviewed and updated.  Review of Systems  Neurological: Negative for tingling and numbness.    Per HPI unless specifically indicated above     Objective:    BP 130/79   Pulse 75   Temp 98.4 F (36.9 C)   Wt 270 lb (122.5 kg)   SpO2 96%   BMI 47.83 kg/m   Wt Readings from Last 3 Encounters:  03/27/20 270 lb (122.5 kg)  01/01/20 271 lb (122.9 kg)  12/04/19 275 lb 12.8 oz (125.1 kg)    Physical Exam Constitutional:      General: She is not in acute distress.    Appearance: Normal appearance. She is well-developed and well-nourished.  HENT:     Head: Normocephalic and atraumatic.  Eyes:     General: Lids are normal. No scleral icterus.       Right eye: No discharge.        Left eye: No discharge.     Conjunctiva/sclera: Conjunctivae normal.  Neck:     Vascular: No carotid bruit or JVD.  Cardiovascular:     Rate and Rhythm: Normal rate and  regular rhythm.     Heart sounds: Normal heart sounds.  Abdominal:     Palpations: There is no hepatomegaly or splenomegaly.  Musculoskeletal:        General: Normal range of motion.     Cervical back: Normal range of motion and neck supple.     Left knee: Tenderness present over the medial joint line and lateral joint line.  Skin:    General: Skin is warm, dry and intact.     Coloration: Skin is not pale.     Findings: No rash.  Neurological:     Mental Status: She is alert and oriented to person, place, and time.  Psychiatric:        Mood and Affect: Mood and affect normal.        Behavior: Behavior normal.        Thought Content: Thought content normal.        Judgment: Judgment normal.     Results for orders placed or performed in visit on 12/04/19  Microscopic Examination   Urine  Result Value Ref Range   WBC, UA 6-10 (A) 0 - 5 /hpf   RBC None seen 0 - 2 /hpf  Epithelial Cells (non renal) 0-10 0 - 10 /hpf   Bacteria, UA None seen None seen/Few  CBC with Differential/Platelet  Result Value Ref Range   WBC 6.6 3.4 - 10.8 x10E3/uL   RBC 4.05 3.77 - 5.28 x10E6/uL   Hemoglobin 13.2 11.1 - 15.9 g/dL   Hematocrit 95.6 38.7 - 46.6 %   MCV 97 79 - 97 fL   MCH 32.6 26.6 - 33.0 pg   MCHC 33.7 31.5 - 35.7 g/dL   RDW 56.4 33.2 - 95.1 %   Platelets 298 150 - 450 x10E3/uL   Neutrophils 60 Not Estab. %   Lymphs 32 Not Estab. %   Monocytes 7 Not Estab. %   Eos 0 Not Estab. %   Basos 0 Not Estab. %   Neutrophils Absolute 4.0 1.4 - 7.0 x10E3/uL   Lymphocytes Absolute 2.1 0.7 - 3.1 x10E3/uL   Monocytes Absolute 0.5 0.1 - 0.9 x10E3/uL   EOS (ABSOLUTE) 0.0 0.0 - 0.4 x10E3/uL   Basophils Absolute 0.0 0.0 - 0.2 x10E3/uL   Immature Granulocytes 1 Not Estab. %   Immature Grans (Abs) 0.0 0.0 - 0.1 x10E3/uL  Comprehensive metabolic panel  Result Value Ref Range   Glucose 99 65 - 99 mg/dL   BUN 11 6 - 24 mg/dL   Creatinine, Ser 8.84 0.57 - 1.00 mg/dL   GFR calc non Af Amer 84 >59  mL/min/1.73   GFR calc Af Amer 97 >59 mL/min/1.73   BUN/Creatinine Ratio 14 9 - 23   Sodium 141 134 - 144 mmol/L   Potassium 3.8 3.5 - 5.2 mmol/L   Chloride 103 96 - 106 mmol/L   CO2 25 20 - 29 mmol/L   Calcium 9.1 8.7 - 10.2 mg/dL   Total Protein 7.0 6.0 - 8.5 g/dL   Albumin 4.3 3.8 - 4.9 g/dL   Globulin, Total 2.7 1.5 - 4.5 g/dL   Albumin/Globulin Ratio 1.6 1.2 - 2.2   Bilirubin Total 0.3 0.0 - 1.2 mg/dL   Alkaline Phosphatase 110 44 - 121 IU/L   AST 31 0 - 40 IU/L   ALT 23 0 - 32 IU/L  Lipid Panel w/o Chol/HDL Ratio  Result Value Ref Range   Cholesterol, Total 241 (H) 100 - 199 mg/dL   Triglycerides 166 0 - 149 mg/dL   HDL 56 >06 mg/dL   VLDL Cholesterol Cal 19 5 - 40 mg/dL   LDL Chol Calc (NIH) 301 (H) 0 - 99 mg/dL  Microalbumin, Urine Waived  Result Value Ref Range   Microalb, Ur Waived 30 (H) 0 - 19 mg/L   Creatinine, Urine Waived 50 10 - 300 mg/dL   Microalb/Creat Ratio 30-300 (H) <30 mg/g  TSH  Result Value Ref Range   TSH 3.560 0.450 - 4.500 uIU/mL  Urinalysis, Routine w reflex microscopic  Result Value Ref Range   Specific Gravity, UA 1.020 1.005 - 1.030   pH, UA 5.5 5.0 - 7.5   Color, UA Yellow Yellow   Appearance Ur Clear Clear   Leukocytes,UA 2+ (A) Negative   Protein,UA Negative Negative/Trace   Glucose, UA Negative Negative   Ketones, UA Negative Negative   RBC, UA Negative Negative   Bilirubin, UA Negative Negative   Urobilinogen, Ur 1.0 0.2 - 1.0 mg/dL   Nitrite, UA Negative Negative   Microscopic Examination See below:   VITAMIN D 25 Hydroxy (Vit-D Deficiency, Fractures)  Result Value Ref Range   Vit D, 25-Hydroxy 25.3 (L) 30.0 - 100.0 ng/mL  Assessment & Plan:   Problem List Items Addressed This Visit   None   Visit Diagnoses    Chronic pain of left knee    -  Primary   New diagnosis.  Pt on anti-coagulant and will rx Tylenol instead of NSAID. Likely arthritis.  Knee x-ray.  consider ortho referral   Relevant Orders   DG Knee Complete  4 Views Left   Ambulatory referral to Physical Therapy       Follow up plan: Results of x-ray.  PRN with Dr. Laural Benes

## 2020-03-31 ENCOUNTER — Other Ambulatory Visit: Payer: Self-pay

## 2020-03-31 ENCOUNTER — Ambulatory Visit
Admission: RE | Admit: 2020-03-31 | Discharge: 2020-03-31 | Disposition: A | Discharge status: Home / Self Care | Payer: Medicare HMO | Source: Ambulatory Visit | Attending: Unknown Physician Specialty | Admitting: Unknown Physician Specialty

## 2020-03-31 ENCOUNTER — Ambulatory Visit
Admission: RE | Admit: 2020-03-31 | Discharge: 2020-03-31 | Disposition: A | Discharge status: Home / Self Care | Payer: Medicare HMO | Attending: Unknown Physician Specialty | Admitting: Unknown Physician Specialty

## 2020-03-31 DIAGNOSIS — M25562 Pain in left knee: Secondary | ICD-10-CM | POA: Insufficient documentation

## 2020-03-31 DIAGNOSIS — G8929 Other chronic pain: Secondary | ICD-10-CM | POA: Diagnosis not present

## 2020-03-31 DIAGNOSIS — M25462 Effusion, left knee: Secondary | ICD-10-CM | POA: Diagnosis not present

## 2020-04-05 ENCOUNTER — Encounter: Payer: Self-pay | Admitting: Unknown Physician Specialty

## 2020-04-17 ENCOUNTER — Ambulatory Visit: Payer: Medicare HMO | Attending: Unknown Physician Specialty | Admitting: Physical Therapy

## 2020-04-24 ENCOUNTER — Ambulatory Visit: Payer: Medicare HMO | Admitting: Physical Therapy

## 2020-04-29 ENCOUNTER — Encounter: Payer: Medicare HMO | Admitting: Physical Therapy

## 2020-05-01 ENCOUNTER — Encounter: Payer: Medicare HMO | Admitting: Physical Therapy

## 2020-05-06 ENCOUNTER — Encounter: Payer: Medicare HMO | Admitting: Physical Therapy

## 2020-05-08 ENCOUNTER — Encounter: Payer: Medicare HMO | Admitting: Physical Therapy

## 2020-05-13 ENCOUNTER — Encounter: Payer: Medicare HMO | Admitting: Physical Therapy

## 2020-05-15 ENCOUNTER — Encounter: Payer: Medicare HMO | Admitting: Physical Therapy

## 2020-05-20 ENCOUNTER — Encounter: Payer: Medicare HMO | Admitting: Physical Therapy

## 2020-05-22 ENCOUNTER — Encounter: Payer: Medicare HMO | Admitting: Physical Therapy

## 2020-05-29 ENCOUNTER — Other Ambulatory Visit: Payer: Self-pay

## 2020-05-29 ENCOUNTER — Ambulatory Visit (INDEPENDENT_AMBULATORY_CARE_PROVIDER_SITE_OTHER): Payer: Medicare HMO | Admitting: Family Medicine

## 2020-05-29 ENCOUNTER — Encounter: Payer: Self-pay | Admitting: Family Medicine

## 2020-05-29 VITALS — BP 129/71 | HR 72 | Temp 98.2°F | Wt 275.2 lb

## 2020-05-29 DIAGNOSIS — F419 Anxiety disorder, unspecified: Secondary | ICD-10-CM | POA: Diagnosis not present

## 2020-05-29 DIAGNOSIS — R202 Paresthesia of skin: Secondary | ICD-10-CM

## 2020-05-29 DIAGNOSIS — E782 Mixed hyperlipidemia: Secondary | ICD-10-CM

## 2020-05-29 DIAGNOSIS — E063 Autoimmune thyroiditis: Secondary | ICD-10-CM

## 2020-05-29 DIAGNOSIS — F331 Major depressive disorder, recurrent, moderate: Secondary | ICD-10-CM

## 2020-05-29 DIAGNOSIS — I129 Hypertensive chronic kidney disease with stage 1 through stage 4 chronic kidney disease, or unspecified chronic kidney disease: Secondary | ICD-10-CM | POA: Diagnosis not present

## 2020-05-29 DIAGNOSIS — M722 Plantar fascial fibromatosis: Secondary | ICD-10-CM | POA: Diagnosis not present

## 2020-05-29 DIAGNOSIS — E559 Vitamin D deficiency, unspecified: Secondary | ICD-10-CM | POA: Diagnosis not present

## 2020-05-29 LAB — BAYER DCA HB A1C WAIVED: HB A1C (BAYER DCA - WAIVED): 5 % (ref <7.0)

## 2020-05-29 MED ORDER — BACLOFEN 10 MG PO TABS: 10.0000 mg | ORAL_TABLET | Freq: Three times a day (TID) | ORAL | 1 refills | Status: DC

## 2020-05-29 MED ORDER — MONTELUKAST SODIUM 10 MG PO TABS: 10.0000 mg | ORAL_TABLET | Freq: Every day | ORAL | 1 refills | Status: DC

## 2020-05-29 MED ORDER — POTASSIUM CHLORIDE CRYS ER 20 MEQ PO TBCR: 20.0000 meq | EXTENDED_RELEASE_TABLET | Freq: Every day | ORAL | 1 refills | Status: DC

## 2020-05-29 MED ORDER — GABAPENTIN 300 MG PO CAPS: 600.0000 mg | ORAL_CAPSULE | Freq: Two times a day (BID) | ORAL | 1 refills | Status: DC

## 2020-05-29 MED ORDER — PANTOPRAZOLE SODIUM 40 MG PO TBEC: 40.0000 mg | DELAYED_RELEASE_TABLET | Freq: Every day | ORAL | 1 refills | Status: DC

## 2020-05-29 MED ORDER — HYDROXYZINE HCL 25 MG PO TABS: 25.0000 mg | ORAL_TABLET | Freq: Every evening | ORAL | 1 refills | Status: DC | PRN

## 2020-05-29 MED ORDER — ATORVASTATIN CALCIUM 40 MG PO TABS: 40.0000 mg | ORAL_TABLET | Freq: Every day | ORAL | 1 refills | Status: DC

## 2020-05-29 MED ORDER — CETIRIZINE HCL 10 MG PO TABS: 10.0000 mg | ORAL_TABLET | Freq: Every day | ORAL | 11 refills | Status: DC

## 2020-05-29 MED ORDER — VENLAFAXINE HCL ER 150 MG PO CP24: ORAL_CAPSULE | ORAL | 1 refills | Status: DC

## 2020-05-29 MED ORDER — VENLAFAXINE HCL ER 75 MG PO CP24: ORAL_CAPSULE | ORAL | 1 refills | Status: DC

## 2020-05-29 MED ORDER — APIXABAN 5 MG PO TABS: 5.0000 mg | ORAL_TABLET | Freq: Two times a day (BID) | ORAL | 1 refills | Status: DC

## 2020-05-29 MED ORDER — AMLODIPINE BESYLATE 10 MG PO TABS: 10.0000 mg | ORAL_TABLET | Freq: Every day | ORAL | 1 refills | Status: DC

## 2020-05-29 MED ORDER — FUROSEMIDE 40 MG PO TABS: 80.0000 mg | ORAL_TABLET | Freq: Two times a day (BID) | ORAL | 1 refills | Status: DC

## 2020-05-29 MED ORDER — FLUTICASONE PROPIONATE 50 MCG/ACT NA SUSP: NASAL | 6 refills | Status: DC

## 2020-05-29 MED ORDER — ALBUTEROL SULFATE HFA 108 (90 BASE) MCG/ACT IN AERS: 2.0000 | INHALATION_SPRAY | Freq: Four times a day (QID) | RESPIRATORY_TRACT | 6 refills | Status: DC | PRN

## 2020-05-29 NOTE — Assessment & Plan Note (Signed)
Under good control on current regimen. Continue current regimen. Continue to monitor. Call with any concerns. Refills given. Labs drawn today.   

## 2020-05-29 NOTE — Assessment & Plan Note (Signed)
Rechecking labs today. Await results. Treat as needed.  °

## 2020-05-29 NOTE — Progress Notes (Signed)
BP 129/71   Pulse 72   Temp 98.2 F (36.8 C)   Wt 275 lb 3.2 oz (124.8 kg)   SpO2 96%   BMI 48.75 kg/m    Subjective:    Patient ID: Kaylee Fox, female    DOB: [birth year], 60 y.o.   MRN: [id number]  HPI: Kaylee Fox is a 60 y.o. female  Chief Complaint  Patient presents with  . Hypothyroidism  . Hypertension  . Depression    Patient states she thinks her current medication is not working anymore   . Hyperlipidemia  . Pain    Patient states she feels pain in her right heel. Patient states she has a burning sensation in both her feet.    HYPERTENSION / HYPERLIPIDEMIA Satisfied with current treatment? yes Duration of hypertension: chronic BP monitoring frequency: not checking BP medication side effects: no Past BP meds: lasix, amlodipine Duration of hyperlipidemia: chronic Cholesterol medication side effects: no Cholesterol supplements: none Past cholesterol medications: atorvastatin Medication compliance: excellent compliance Aspirin: no Recent stressors: yes Recurrent headaches: no Visual changes: no Palpitations: no Dyspnea: no Chest pain: no Lower extremity edema: no Dizzy/lightheaded: no  HYPOTHYROIDISM Thyroid control status:unsure Satisfied with current treatment? yes Medication side effects: no Medication compliance: excellent compliance Etiology of hypothyroidism:  Recent dose adjustment:no Fatigue: yes Cold intolerance: no Heat intolerance: no Weight gain: yes Weight loss: no Constipation: no Diarrhea/loose stools: no Palpitations: yes Lower extremity edema: no Anxiety/depressed mood: yes  DEPRESSION Mood status: exacerbated x 2 months Satisfied with current treatment?: no Symptom severity: moderate  Duration of current treatment : chronic Side effects: no Medication compliance: excellent compliance Psychotherapy/counseling: no  Previous psychiatric medications: effexor Depressed mood: yes Anxious mood: yes Anhedonia:  no Significant weight loss or gain: no Insomnia: no  Fatigue: yes Feelings of worthlessness or guilt: yes Impaired concentration/indecisiveness: no Suicidal ideations: no Hopelessness: no Crying spells: yes Depression screen [facility] 2/9 05/29/2020 01/01/2020 12/04/2019 11/26/2019 05/31/2019  Decreased Interest 2 0 0 0 0  Down, Depressed, Hopeless 3 0 0 0 0  PHQ - 2 Score 5 0 0 0 0  Altered sleeping 0 0 1 - 0  Tired, decreased energy 3 0 1 - 0  Change in appetite 3 0 0 - 0  Feeling bad or failure about yourself  0 0 0 - 0  Trouble concentrating 2 0 0 - 0  Moving slowly or fidgety/restless 0 0 0 - 0  Suicidal thoughts 0 0 0 - 0  PHQ-9 Score 13 0 2 - 0  Difficult doing work/chores Somewhat difficult Not difficult at all Not difficult at all - Not difficult at all  Some recent data might be hidden    Relevant past medical, surgical, family and social history reviewed and updated as indicated. Interim medical history since our last visit reviewed. Allergies and medications reviewed and updated.  Review of Systems  Constitutional: Negative.   Respiratory: Negative.   Cardiovascular: Negative.   Gastrointestinal: Negative.   Musculoskeletal: Negative.   Neurological: Positive for numbness. Negative for dizziness, tremors, seizures, syncope, facial asymmetry, speech difficulty, weakness, light-headedness and headaches.  Psychiatric/Behavioral: Positive for dysphoric mood. Negative for agitation, behavioral problems, confusion, decreased concentration, hallucinations, self-injury, sleep disturbance and suicidal ideas. The patient is nervous/anxious. The patient is not hyperactive.     Per HPI unless specifically indicated above     Objective:    BP 129/71   Pulse 72   Temp 98.2 F (36.8 C)   Wt 275 lb 3.2  oz (124.8 kg)   SpO2 96%   BMI 48.75 kg/m   Wt Readings from Last 3 Encounters:  05/29/20 275 lb 3.2 oz (124.8 kg)  03/27/20 270 lb (122.5 kg)  01/01/20 271 lb (122.9 kg)     Physical Exam Vitals and nursing note reviewed.  Constitutional:      General: She is not in acute distress.    Appearance: Normal appearance. She is obese. She is not ill-appearing, toxic-appearing or diaphoretic.  HENT:     Head: Normocephalic and atraumatic.     Right Ear: External ear normal.     Left Ear: External ear normal.     Nose: Nose normal.     Mouth/Throat:     Mouth: Mucous membranes are moist.     Pharynx: Oropharynx is clear.  Eyes:     General: No scleral icterus.       Right eye: No discharge.        Left eye: No discharge.     Extraocular Movements: Extraocular movements intact.     Conjunctiva/sclera: Conjunctivae normal.     Pupils: Pupils are equal, round, and reactive to light.  Cardiovascular:     Rate and Rhythm: Normal rate and regular rhythm.     Pulses: Normal pulses.     Heart sounds: Normal heart sounds. No murmur heard. No friction rub. No gallop.   Pulmonary:     Effort: Pulmonary effort is normal. No respiratory distress.     Breath sounds: Normal breath sounds. No stridor. No wheezing, rhonchi or rales.  Chest:     Chest wall: No tenderness.  Musculoskeletal:        General: Normal range of motion.     Cervical back: Normal range of motion and neck supple.  Skin:    General: Skin is warm and dry.     Capillary Refill: Capillary refill takes less than 2 seconds.     Coloration: Skin is not jaundiced or pale.     Findings: No bruising, erythema, lesion or rash.  Neurological:     General: No focal deficit present.     Mental Status: She is alert and oriented to person, place, and time. Mental status is at baseline.  Psychiatric:        Mood and Affect: Mood normal.        Behavior: Behavior normal.        Thought Content: Thought content normal.        Judgment: Judgment normal.     Results for orders placed or performed in visit on 12/04/19  Microscopic Examination   Urine  Result Value Ref Range   WBC, UA 6-10 (A) 0 - 5 /hpf    RBC None seen 0 - 2 /hpf   Epithelial Cells (non renal) 0-10 0 - 10 /hpf   Bacteria, UA None seen None seen/Few  CBC with Differential/Platelet  Result Value Ref Range   WBC 6.6 3.4 - 10.8 x10E3/uL   RBC 4.05 3.77 - 5.28 x10E6/uL   Hemoglobin 13.2 11.1 - 15.9 g/dL   Hematocrit 54.0 98.1 - 46.6 %   MCV 97 79 - 97 fL   MCH 32.6 26.6 - 33.0 pg   MCHC 33.7 31.5 - 35.7 g/dL   RDW 19.1 47.8 - 29.5 %   Platelets 298 150 - 450 x10E3/uL   Neutrophils 60 Not Estab. %   Lymphs 32 Not Estab. %   Monocytes 7 Not Estab. %   Eos 0 Not Estab. %  Basos 0 Not Estab. %   Neutrophils Absolute 4.0 1.4 - 7.0 x10E3/uL   Lymphocytes Absolute 2.1 0.7 - 3.1 x10E3/uL   Monocytes Absolute 0.5 0.1 - 0.9 x10E3/uL   EOS (ABSOLUTE) 0.0 0.0 - 0.4 x10E3/uL   Basophils Absolute 0.0 0.0 - 0.2 x10E3/uL   Immature Granulocytes 1 Not Estab. %   Immature Grans (Abs) 0.0 0.0 - 0.1 x10E3/uL  Comprehensive metabolic panel  Result Value Ref Range   Glucose 99 65 - 99 mg/dL   BUN 11 6 - 24 mg/dL   Creatinine, Ser 2.840.78 0.57 - 1.00 mg/dL   GFR calc non Af Amer 84 >59 mL/min/1.73   GFR calc Af Amer 97 >59 mL/min/1.73   BUN/Creatinine Ratio 14 9 - 23   Sodium 141 134 - 144 mmol/L   Potassium 3.8 3.5 - 5.2 mmol/L   Chloride 103 96 - 106 mmol/L   CO2 25 20 - 29 mmol/L   Calcium 9.1 8.7 - 10.2 mg/dL   Total Protein 7.0 6.0 - 8.5 g/dL   Albumin 4.3 3.8 - 4.9 g/dL   Globulin, Total 2.7 1.5 - 4.5 g/dL   Albumin/Globulin Ratio 1.6 1.2 - 2.2   Bilirubin Total 0.3 0.0 - 1.2 mg/dL   Alkaline Phosphatase 110 44 - 121 IU/L   AST 31 0 - 40 IU/L   ALT 23 0 - 32 IU/L  Lipid Panel w/o Chol/HDL Ratio  Result Value Ref Range   Cholesterol, Total 241 (H) 100 - 199 mg/dL   Triglycerides 132107 0 - 149 mg/dL   HDL 56 >44>39 mg/dL   VLDL Cholesterol Cal 19 5 - 40 mg/dL   LDL Chol Calc (NIH) 010166 (H) 0 - 99 mg/dL  Microalbumin, Urine Waived  Result Value Ref Range   Microalb, Ur Waived 30 (H) 0 - 19 mg/L   Creatinine, Urine Waived 50 10  - 300 mg/dL   Microalb/Creat Ratio 30-300 (H) <30 mg/g  TSH  Result Value Ref Range   TSH 3.560 0.450 - 4.500 uIU/mL  Urinalysis, Routine w reflex microscopic  Result Value Ref Range   Specific Gravity, UA 1.020 1.005 - 1.030   pH, UA 5.5 5.0 - 7.5   Color, UA Yellow Yellow   Appearance Ur Clear Clear   Leukocytes,UA 2+ (A) Negative   Protein,UA Negative Negative/Trace   Glucose, UA Negative Negative   Ketones, UA Negative Negative   RBC, UA Negative Negative   Bilirubin, UA Negative Negative   Urobilinogen, Ur 1.0 0.2 - 1.0 mg/dL   Nitrite, UA Negative Negative   Microscopic Examination See below:   VITAMIN D 25 Hydroxy (Vit-D Deficiency, Fractures)  Result Value Ref Range   Vit D, 25-Hydroxy 25.3 (L) 30.0 - 100.0 ng/mL      Assessment & Plan:   Problem List Items Addressed This Visit      Endocrine   Autoimmune hypothyroidism - Primary    Rechecking labs today. Await results. Treat as needed.       Relevant Orders   CBC with Differential/Platelet   Comprehensive metabolic panel   TSH     Genitourinary   Benign hypertensive renal disease    Under good control on current regimen. Continue current regimen. Continue to monitor. Call with any concerns. Refills given. Labs drawn today.        Relevant Orders   CBC with Differential/Platelet   Comprehensive metabolic panel     Other   Depression    Not doing well. Will check labs  to see if they are contributing. If normal, will add wellbutrin. Call with any concerns.       Relevant Medications   venlafaxine XR (EFFEXOR-XR) 150 MG 24 hr capsule   venlafaxine XR (EFFEXOR-XR) 75 MG 24 hr capsule   hydrOXYzine (ATARAX/VISTARIL) 25 MG tablet   Other Relevant Orders   CBC with Differential/Platelet   Comprehensive metabolic panel   Anxiety    Not doing well. Will check labs to see if they are contributing. If normal, will add wellbutrin. Call with any concerns.       Relevant Medications   venlafaxine XR  (EFFEXOR-XR) 150 MG 24 hr capsule   venlafaxine XR (EFFEXOR-XR) 75 MG 24 hr capsule   hydrOXYzine (ATARAX/VISTARIL) 25 MG tablet   Other Relevant Orders   CBC with Differential/Platelet   Comprehensive metabolic panel   Vitamin D deficiency    Rechecking labs today. Await results. Treat as needed.       Relevant Orders   CBC with Differential/Platelet   Comprehensive metabolic panel   VITAMIN D 25 Hydroxy (Vit-D Deficiency, Fractures)   Mixed hyperlipidemia    Under good control on current regimen. Continue current regimen. Continue to monitor. Call with any concerns. Refills given. Labs drawn today.        Relevant Medications   furosemide (LASIX) 40 MG tablet   atorvastatin (LIPITOR) 40 MG tablet   apixaban (ELIQUIS) 5 MG TABS tablet   amLODipine (NORVASC) 10 MG tablet   Other Relevant Orders   CBC with Differential/Platelet   Comprehensive metabolic panel   Lipid Panel w/o Chol/HDL Ratio    Other Visit Diagnoses    Paresthesias       Checking labs. Await results.    Relevant Orders   Bayer DCA Hb A1c Waived   Plantar fasciitis of right foot       Referral to podiatry made today.   Relevant Orders   Ambulatory referral to Podiatry       Follow up plan: Return in about 6 weeks (around 07/10/2020).

## 2020-05-29 NOTE — Assessment & Plan Note (Signed)
Not doing well. Will check labs to see if they are contributing. If normal, will add wellbutrin. Call with any concerns.  

## 2020-05-29 NOTE — Assessment & Plan Note (Signed)
Not doing well. Will check labs to see if they are contributing. If normal, will add wellbutrin. Call with any concerns.

## 2020-05-30 LAB — CBC WITH DIFFERENTIAL/PLATELET
Basophils Absolute: 0 10*3/uL (ref 0.0–0.2)
Basos: 0 %
EOS (ABSOLUTE): 0 10*3/uL (ref 0.0–0.4)
Eos: 0 %
Hematocrit: 38.1 % (ref 34.0–46.6)
Hemoglobin: 12.4 g/dL (ref 11.1–15.9)
Immature Grans (Abs): 0 10*3/uL (ref 0.0–0.1)
Immature Granulocytes: 0 %
Lymphocytes Absolute: 1.8 10*3/uL (ref 0.7–3.1)
Lymphs: 37 %
MCH: 31.6 pg (ref 26.6–33.0)
MCHC: 32.5 g/dL (ref 31.5–35.7)
MCV: 97 fL (ref 79–97)
Monocytes Absolute: 0.3 10*3/uL (ref 0.1–0.9)
Monocytes: 7 %
Neutrophils Absolute: 2.7 10*3/uL (ref 1.4–7.0)
Neutrophils: 56 %
Platelets: 227 10*3/uL (ref 150–450)
RBC: 3.93 x10E6/uL (ref 3.77–5.28)
RDW: 12.5 % (ref 11.7–15.4)
WBC: 4.9 10*3/uL (ref 3.4–10.8)

## 2020-05-30 LAB — COMPREHENSIVE METABOLIC PANEL
ALT: 16 IU/L (ref 0–32)
AST: 23 IU/L (ref 0–40)
Albumin/Globulin Ratio: 1.3 (ref 1.2–2.2)
Albumin: 4.1 g/dL (ref 3.8–4.9)
Alkaline Phosphatase: 104 IU/L (ref 44–121)
BUN/Creatinine Ratio: 16 (ref 9–23)
BUN: 11 mg/dL (ref 6–24)
Bilirubin Total: 0.3 mg/dL (ref 0.0–1.2)
CO2: 22 mmol/L (ref 20–29)
Calcium: 9.1 mg/dL (ref 8.7–10.2)
Chloride: 103 mmol/L (ref 96–106)
Creatinine, Ser: 0.68 mg/dL (ref 0.57–1.00)
Globulin, Total: 3.2 g/dL (ref 1.5–4.5)
Glucose: 94 mg/dL (ref 65–99)
Potassium: 4 mmol/L (ref 3.5–5.2)
Sodium: 140 mmol/L (ref 134–144)
Total Protein: 7.3 g/dL (ref 6.0–8.5)
eGFR: 101 mL/min/{1.73_m2} (ref >59)

## 2020-05-30 LAB — LIPID PANEL W/O CHOL/HDL RATIO
Cholesterol, Total: 175 mg/dL (ref 100–199)
HDL: 52 mg/dL (ref >39)
LDL Chol Calc (NIH): 107 mg/dL — ABNORMAL HIGH (ref 0–99)
Triglycerides: 85 mg/dL (ref 0–149)
VLDL Cholesterol Cal: 16 mg/dL (ref 5–40)

## 2020-05-30 LAB — TSH: TSH: 1.67 u[IU]/mL (ref 0.450–4.500)

## 2020-05-30 LAB — VITAMIN D 25 HYDROXY (VIT D DEFICIENCY, FRACTURES): Vit D, 25-Hydroxy: 26.1 ng/mL — ABNORMAL LOW (ref 30.0–100.0)

## 2020-06-05 ENCOUNTER — Telehealth: Payer: Self-pay

## 2020-06-05 NOTE — Telephone Encounter (Signed)
Copied from CRM [id number]. Topic: General - Other >> Jun 05, 2020 10:32 AM Gaetana Michaelis A wrote: Reason for CRM: Patient would like to be contacted by a member of staff to discuss lab results from 05/29/20  Patient has a specific interest in discussing their thyroid further when possible  Please contact to further advise

## 2020-06-05 NOTE — Telephone Encounter (Signed)
Called and left a message for patient to return my call.  

## 2020-06-06 NOTE — Telephone Encounter (Signed)
Called and left a message for patient to return my call.  

## 2020-06-09 MED ORDER — BUPROPION HCL ER (XL) 150 MG PO TB24: ORAL_TABLET | ORAL | 1 refills | Status: DC

## 2020-06-09 NOTE — Telephone Encounter (Signed)
Patient given lab results, she would like to know if antidepressant will be changed since TSH is normal.

## 2020-06-09 NOTE — Telephone Encounter (Signed)
Called and LVM notifying patient of medication. Appointment scheduled for 07/10/20 already.

## 2020-06-09 NOTE — Telephone Encounter (Signed)
Wellbutrin sent to her pharmacy. Please make follow up for 4 weeks.

## 2020-06-16 ENCOUNTER — Other Ambulatory Visit: Payer: Self-pay | Admitting: Family Medicine

## 2020-07-10 ENCOUNTER — Encounter: Payer: Self-pay | Admitting: Family Medicine

## 2020-07-10 ENCOUNTER — Ambulatory Visit (INDEPENDENT_AMBULATORY_CARE_PROVIDER_SITE_OTHER): Payer: Medicare HMO | Admitting: Family Medicine

## 2020-07-10 ENCOUNTER — Other Ambulatory Visit: Payer: Self-pay

## 2020-07-10 VITALS — BP 110/70 | HR 73 | Temp 98.2°F | Wt 273.0 lb

## 2020-07-10 DIAGNOSIS — R0981 Nasal congestion: Secondary | ICD-10-CM | POA: Diagnosis not present

## 2020-07-10 DIAGNOSIS — F331 Major depressive disorder, recurrent, moderate: Secondary | ICD-10-CM

## 2020-07-10 MED ORDER — BUPROPION HCL ER (XL) 150 MG PO TB24: 150.0000 mg | ORAL_TABLET | Freq: Every day | ORAL | 1 refills | Status: DC

## 2020-07-10 MED ORDER — PREDNISONE 50 MG PO TABS: 50.0000 mg | ORAL_TABLET | Freq: Every day | ORAL | 0 refills | Status: DC

## 2020-07-10 NOTE — Assessment & Plan Note (Signed)
Stopped her effexor by mistake. Will restart her medicine and recheck 1 month. Call with any concerns.

## 2020-07-10 NOTE — Progress Notes (Signed)
BP 110/70   Pulse 73   Temp 98.2 F (36.8 C)   Wt 273 lb (123.8 kg)   SpO2 97%   BMI 48.36 kg/m    Subjective:    Patient ID: Kaylee Fox, female    DOB: Aug 23, 1960, 60 y.o.   MRN: [id number]  HPI: Kaylee Fox is a 60 y.o. female  Chief Complaint  Patient presents with  . Depression    Patient states she could not tolerate 2 pills so she is only taking one. Patient states it was causing her to be very irritable   . Anxiety  . Sinusitis    Patient states she has been having sinus pressure for about 2 weeks.    ANXIETY/DEPRESSION- stopped her effexor. Has not been feeling well.  Duration: chronic Status:uncontrolled Anxious mood: yes  Excessive worrying: no Irritability: yes  Sweating: no Nausea: no Palpitations:no Hyperventilation: no Panic attacks: no Agoraphobia: no  Obscessions/compulsions: no Depressed mood: yes Depression screen [facility] 2/9 07/10/2020 05/29/2020 01/01/2020 12/04/2019 11/26/2019  Decreased Interest 2 2 0 0 0  Down, Depressed, Hopeless 2 3 0 0 0  PHQ - 2 Score 4 5 0 0 0  Altered sleeping 1 0 0 1 -  Tired, decreased energy 2 3 0 1 -  Change in appetite 2 3 0 0 -  Feeling bad or failure about yourself  1 0 0 0 -  Trouble concentrating 1 2 0 0 -  Moving slowly or fidgety/restless 1 0 0 0 -  Suicidal thoughts 1 0 0 0 -  PHQ-9 Score 13 13 0 2 -  Difficult doing work/chores Somewhat difficult Somewhat difficult Not difficult at all Not difficult at all -  Some recent data might be hidden   GAD 7 : Generalized Anxiety Score 07/10/2020 01/01/2020 05/31/2019  Nervous, Anxious, on Edge 1 0 0  Control/stop worrying 1 0 1  Worry too much - different things 1 0 1  Trouble relaxing 1 0 0  Restless 0 0 0  Easily annoyed or irritable 1 0 1  Afraid - awful might happen 0 0 0  Total GAD 7 Score 5 0 3  Anxiety Difficulty Somewhat difficult Not difficult at all Not difficult at all   Anhedonia: no Weight changes: yes Insomnia: no   Hypersomnia:  no Fatigue/loss of energy: yes Feelings of worthlessness: no Feelings of guilt: no Impaired concentration/indecisiveness: no Suicidal ideations: no  Crying spells: no Recent Stressors/Life Changes: no   Relationship problems: no   Family stress: no     Financial stress: no    Job stress: no    Recent death/loss: no  UPPER RESPIRATORY TRACT INFECTION Duration: 2 weeks Worst symptom: congestion Fever: no Cough: no Shortness of breath: no Wheezing: no Chest pain: no Chest tightness: no Chest congestion: no Nasal congestion: yes Runny nose: yes Post nasal drip: yes Sneezing: no Sore throat: no Swollen glands: no Sinus pressure: yes Headache: yes Face pain: no Toothache: no Ear pain:  no Ear pressure: no  Eyes red/itching:yes Eye drainage/crusting: no  Vomiting: no Rash: no Fatigue: yes Sick contacts: no Strep contacts: no  Context: stable Recurrent sinusitis: no Relief with OTC cold/cough medications: no  Treatments attempted: none    Relevant past medical, surgical, family and social history reviewed and updated as indicated. Interim medical history since our last visit reviewed. Allergies and medications reviewed and updated.  Review of Systems  Constitutional: Negative.   Respiratory: Negative.   Cardiovascular: Negative.   Gastrointestinal: Positive  for abdominal pain. Negative for abdominal distention, anal bleeding, blood in stool, constipation, diarrhea, nausea, rectal pain and vomiting.  Musculoskeletal: Negative.   Skin: Negative.   Neurological: Negative.   Psychiatric/Behavioral: Positive for agitation and dysphoric mood. Negative for behavioral problems, confusion, decreased concentration, hallucinations, self-injury, sleep disturbance and suicidal ideas. The patient is nervous/anxious. The patient is not hyperactive.     Per HPI unless specifically indicated above     Objective:    BP 110/70   Pulse 73   Temp 98.2 F (36.8 C)   Wt 273  lb (123.8 kg)   SpO2 97%   BMI 48.36 kg/m   Wt Readings from Last 3 Encounters:  07/10/20 273 lb (123.8 kg)  05/29/20 275 lb 3.2 oz (124.8 kg)  03/27/20 270 lb (122.5 kg)    Physical Exam Vitals and nursing note reviewed.  Constitutional:      General: She is not in acute distress.    Appearance: Normal appearance. She is obese. She is not ill-appearing, toxic-appearing or diaphoretic.  HENT:     Head: Normocephalic and atraumatic.     Right Ear: External ear normal.     Left Ear: External ear normal.     Nose: Nose normal.     Mouth/Throat:     Mouth: Mucous membranes are moist.     Pharynx: Oropharynx is clear.  Eyes:     General: No scleral icterus.       Right eye: No discharge.        Left eye: No discharge.     Extraocular Movements: Extraocular movements intact.     Conjunctiva/sclera: Conjunctivae normal.     Pupils: Pupils are equal, round, and reactive to light.  Cardiovascular:     Rate and Rhythm: Normal rate and regular rhythm.     Pulses: Normal pulses.     Heart sounds: Normal heart sounds. No murmur heard. No friction rub. No gallop.   Pulmonary:     Effort: Pulmonary effort is normal. No respiratory distress.     Breath sounds: Normal breath sounds. No stridor. No wheezing, rhonchi or rales.  Chest:     Chest wall: No tenderness.  Musculoskeletal:        General: Normal range of motion.     Cervical back: Normal range of motion and neck supple.  Skin:    General: Skin is warm and dry.     Capillary Refill: Capillary refill takes less than 2 seconds.     Coloration: Skin is not jaundiced or pale.     Findings: No bruising, erythema, lesion or rash.  Neurological:     General: No focal deficit present.     Mental Status: She is alert and oriented to person, place, and time. Mental status is at baseline.  Psychiatric:        Mood and Affect: Mood normal.        Behavior: Behavior normal.        Thought Content: Thought content normal.         Judgment: Judgment normal.     Results for orders placed or performed in visit on 05/29/20  CBC with Differential/Platelet  Result Value Ref Range   WBC 4.9 3.4 - 10.8 x10E3/uL   RBC 3.93 3.77 - 5.28 x10E6/uL   Hemoglobin 12.4 11.1 - 15.9 g/dL   Hematocrit 38.1 34.0 - 46.6 %   MCV 97 79 - 97 fL   MCH 31.6 26.6 - 33.0 pg   MCHC  32.5 31.5 - 35.7 g/dL   RDW 12.5 11.7 - 15.4 %   Platelets 227 150 - 450 x10E3/uL   Neutrophils 56 Not Estab. %   Lymphs 37 Not Estab. %   Monocytes 7 Not Estab. %   Eos 0 Not Estab. %   Basos 0 Not Estab. %   Neutrophils Absolute 2.7 1.4 - 7.0 x10E3/uL   Lymphocytes Absolute 1.8 0.7 - 3.1 x10E3/uL   Monocytes Absolute 0.3 0.1 - 0.9 x10E3/uL   EOS (ABSOLUTE) 0.0 0.0 - 0.4 x10E3/uL   Basophils Absolute 0.0 0.0 - 0.2 x10E3/uL   Immature Granulocytes 0 Not Estab. %   Immature Grans (Abs) 0.0 0.0 - 0.1 x10E3/uL  Comprehensive metabolic panel  Result Value Ref Range   Glucose 94 65 - 99 mg/dL   BUN 11 6 - 24 mg/dL   Creatinine, Ser 0.68 0.57 - 1.00 mg/dL   eGFR 101 >59 mL/min/1.73   BUN/Creatinine Ratio 16 9 - 23   Sodium 140 134 - 144 mmol/L   Potassium 4.0 3.5 - 5.2 mmol/L   Chloride 103 96 - 106 mmol/L   CO2 22 20 - 29 mmol/L   Calcium 9.1 8.7 - 10.2 mg/dL   Total Protein 7.3 6.0 - 8.5 g/dL   Albumin 4.1 3.8 - 4.9 g/dL   Globulin, Total 3.2 1.5 - 4.5 g/dL   Albumin/Globulin Ratio 1.3 1.2 - 2.2   Bilirubin Total 0.3 0.0 - 1.2 mg/dL   Alkaline Phosphatase 104 44 - 121 IU/L   AST 23 0 - 40 IU/L   ALT 16 0 - 32 IU/L  Lipid Panel w/o Chol/HDL Ratio  Result Value Ref Range   Cholesterol, Total 175 100 - 199 mg/dL   Triglycerides 85 0 - 149 mg/dL   HDL 52 >39 mg/dL   VLDL Cholesterol Cal 16 5 - 40 mg/dL   LDL Chol Calc (NIH) 107 (H) 0 - 99 mg/dL  VITAMIN D 25 Hydroxy (Vit-D Deficiency, Fractures)  Result Value Ref Range   Vit D, 25-Hydroxy 26.1 (L) 30.0 - 100.0 ng/mL  TSH  Result Value Ref Range   TSH 1.670 0.450 - 4.500 uIU/mL  Bayer DCA Hb  A1c Waived  Result Value Ref Range   HB A1C (BAYER DCA - WAIVED) 5.0 <7.0 %      Assessment & Plan:   Problem List Items Addressed This Visit      Other   Depression    Stopped her effexor by mistake. Will restart her medicine and recheck 1 month. Call with any concerns.       Relevant Medications   buPROPion (WELLBUTRIN XL) 150 MG 24 hr tablet    Other Visit Diagnoses    Nasal congestion    -  Primary   Will treat with prednisone burst. Call if not getting better or getting worse. Continue to monitor.        Follow up plan: Return in about 4 weeks (around 08/07/2020).

## 2020-07-10 NOTE — Patient Instructions (Signed)
Take 1 75mg  effexor for 5 days, then 1 150mg  effexor for 5 days, then 1 75 and 1 150 after that.

## 2020-08-07 ENCOUNTER — Ambulatory Visit: Payer: Medicare HMO | Admitting: Family Medicine

## 2020-08-08 DIAGNOSIS — H35033 Hypertensive retinopathy, bilateral: Secondary | ICD-10-CM | POA: Diagnosis not present

## 2020-08-08 DIAGNOSIS — Z79899 Other long term (current) drug therapy: Secondary | ICD-10-CM | POA: Diagnosis not present

## 2020-08-08 DIAGNOSIS — H04123 Dry eye syndrome of bilateral lacrimal glands: Secondary | ICD-10-CM | POA: Diagnosis not present

## 2020-08-08 DIAGNOSIS — H43811 Vitreous degeneration, right eye: Secondary | ICD-10-CM | POA: Diagnosis not present

## 2020-08-08 DIAGNOSIS — H01006 Unspecified blepharitis left eye, unspecified eyelid: Secondary | ICD-10-CM | POA: Diagnosis not present

## 2020-08-08 DIAGNOSIS — H25813 Combined forms of age-related cataract, bilateral: Secondary | ICD-10-CM | POA: Diagnosis not present

## 2020-08-08 DIAGNOSIS — H01003 Unspecified blepharitis right eye, unspecified eyelid: Secondary | ICD-10-CM | POA: Diagnosis not present

## 2020-08-10 ENCOUNTER — Other Ambulatory Visit: Payer: Self-pay | Admitting: Family Medicine

## 2020-08-10 NOTE — Telephone Encounter (Signed)
Pt request to change pharmacy Requested Prescriptions  Pending Prescriptions Disp Refills  . buPROPion (WELLBUTRIN XL) 150 MG 24 hr tablet [Pharmacy Med Name: BUPROPION XL 150MG  TABLETS (24 H)] 60 tablet 0    Sig: TAKE 1 TABLET BY MOUTH EVERY MORNING FOR 1 WEEK THEN TAKE 1 TABLET BY MOUTH TWICE DAILY     Psychiatry: Antidepressants - bupropion Passed - 08/10/2020  8:40 AM      Passed - Completed PHQ-2 or PHQ-9 in the last 360 days      Passed - Last BP in normal range    BP Readings from Last 1 Encounters:  07/10/20 110/70         Passed - Valid encounter within last 6 months    Recent Outpatient Visits          1 month ago Nasal congestion   [facility] [place], [place], [place]   2 months ago Autoimmune hypothyroidism   [facility] [place], Megan P, DO   4 months ago Chronic pain of left knee   [facility], NP   7 months ago Allergic contact dermatitis, unspecified trigger   [facility] [place], Megan P, DO   8 months ago Routine general medical examination at a health care facility   [facility] [place], [place], DO      Future Appointments            In 3 months [facility]

## 2020-10-30 ENCOUNTER — Other Ambulatory Visit: Payer: Self-pay | Admitting: Nurse Practitioner

## 2020-11-05 ENCOUNTER — Telehealth: Payer: Self-pay | Admitting: Family Medicine

## 2020-11-05 NOTE — Telephone Encounter (Signed)
Copied from CRM [id number]. Topic: Medicare AWV >> Nov 05, 2020  1:21 PM Hannan, [place] wrote: Reason for CRM: N/A, unable to leave message to reschedule AWVs to 11/28/20 @ 1:00 due to [facility] schedule change. This will be by phone not in the office. If patient calls back please inform them their appointment will now by by telephone on 11/28/20 @ 1:00.

## 2020-11-26 ENCOUNTER — Ambulatory Visit: Payer: Medicare HMO

## 2020-11-28 ENCOUNTER — Ambulatory Visit: Payer: Medicare HMO

## 2020-11-29 ENCOUNTER — Other Ambulatory Visit: Payer: Self-pay

## 2020-11-29 ENCOUNTER — Ambulatory Visit (INDEPENDENT_AMBULATORY_CARE_PROVIDER_SITE_OTHER): Payer: Medicare HMO | Admitting: *Deleted

## 2020-11-29 DIAGNOSIS — Z0000 Encounter for general adult medical examination without abnormal findings: Secondary | ICD-10-CM | POA: Diagnosis not present

## 2020-11-29 NOTE — Progress Notes (Signed)
Subjective:   Kaylee Fox is a 60 y.o. female who presents for Medicare Annual (Subsequent) preventive examination.  Review of Systems    I connected with  Kaylee Fox on 11/30/20 by a audio enabled telemedicine application and verified that I am speaking with the correct person using two identifiers.   I discussed the limitations of evaluation and management by telemedicine. The patient expressed understanding and agreed to proceed.    Location of patient: home Location of staff: office   People attending visit: Kaylee Fox; Kaylee Fox, RMA   Objective:    Today's Vitals   There is no height or weight on file to calculate BMI.  Advanced Directives 11/29/2020 11/26/2019 11/02/2018 08/22/2017 11/09/2016 08/20/2016 07/13/2016  Does Patient Have a Medical Advance Directive? Yes Yes Yes No No No No  Type of Estate agent of [place];Living will Healthcare Power of [place];Living will Living will;Healthcare Power of Attorney - - - -  Does patient want to make changes to medical advance directive? No - Patient declined - - - - - -  Copy of Healthcare Power of Attorney in Chart? No - copy requested No - copy requested No - copy requested - - - -  Would patient like information on creating a medical advance directive? - - - Yes (MAU/Ambulatory/Procedural Areas - Information given) - Yes (MAU/Ambulatory/Procedural Areas - Information given) -    Current Medications (verified) Outpatient Encounter Medications as of 11/29/2020  Medication Sig   acetaminophen (TYLENOL) 325 MG tablet Take by mouth as needed.    albuterol (VENTOLIN HFA) 108 (90 Base) MCG/ACT inhaler Inhale 2 puffs into the lungs every 6 (six) hours as needed for wheezing or shortness of breath.   amLODipine (NORVASC) 10 MG tablet Take 1 tablet (10 mg total) by mouth daily.   apixaban (ELIQUIS) 5 MG TABS tablet Take 1 tablet (5 mg total) by mouth 2 (two) times daily.   VOLTAREN 1 % GEL    atorvastatin  (LIPITOR) 40 MG tablet Take 1 tablet (40 mg total) by mouth daily.   azaTHIOprine (IMURAN) 50 MG tablet Take 100 mg by mouth daily.   baclofen (LIORESAL) 10 MG tablet Take 1 tablet (10 mg total) by mouth 3 (three) times daily.   buPROPion (WELLBUTRIN XL) 150 MG 24 hr tablet TAKE 1 TABLET BY MOUTH EVERY MORNING FOR 1 WEEK THEN TAKE 1 TABLET BY MOUTH TWICE DAILY (Patient taking differently: Take 150 mg by mouth daily.)   calcium carbonate (OS-CAL) 600 MG TABS tablet Take by mouth.   cetirizine (ZYRTEC) 10 MG tablet Take 1 tablet (10 mg total) by mouth daily.   Cholecalciferol 25 MCG (1000 UT) capsule Take 1,000 Units by mouth daily. Takes 2 1000 mg per day   fluticasone (FLONASE) 50 MCG/ACT nasal spray USE 2 SPRAYS INTO BOTH NOSTRILS DAILY.   furosemide (LASIX) 40 MG tablet Take 2 tablets (80 mg total) by mouth 2 (two) times daily.   gabapentin (NEURONTIN) 300 MG capsule Take 2 capsules (600 mg total) by mouth 2 (two) times daily.   hydroxychloroquine (PLAQUENIL) 200 MG tablet Take 400 mg by mouth 2 (two) times daily.    hydrOXYzine (ATARAX/VISTARIL) 25 MG tablet Take 1 tablet (25 mg total) by mouth at bedtime as needed.   levothyroxine (SYNTHROID) 75 MCG tablet TAKE 1 TABLET(75 MCG) BY MOUTH DAILY BEFORE BREAKFAST   montelukast (SINGULAIR) 10 MG tablet Take 1 tablet (10 mg total) by mouth at bedtime.   Multiple Vitamin (MULTI-VITAMINS) TABS Take  by mouth.   mupirocin cream (BACTROBAN) 2 % Apply to affected area on arms/legs twice a day as needed.   nystatin (MYCOSTATIN) 100000 UNIT/ML suspension Take 5 mLs (500,000 Units total) by mouth 4 (four) times daily. (Patient not taking: Reported on 07/10/2020)   pantoprazole (PROTONIX) 40 MG tablet Take 1 tablet (40 mg total) by mouth daily.   potassium chloride SA (KLOR-CON) 20 MEQ tablet Take 1 tablet (20 mEq total) by mouth daily.   predniSONE (DELTASONE) 50 MG tablet Take 1 tablet (50 mg total) by mouth daily with breakfast. (Patient not taking: Reported  on 11/29/2020)   triamcinolone cream (KENALOG) 0.1 % Apply 1 application topically 2 (two) times daily.   venlafaxine XR (EFFEXOR-XR) 150 MG 24 hr capsule TAKE 1 CAPSULE DAILY WITH BREAKFAST with the 75mg  for 225mg  daily (Patient not taking: No sig reported)   venlafaxine XR (EFFEXOR-XR) 75 MG 24 hr capsule TAKE 1 CAPSULE EVERY DAY WITH BREAKFAST with the 150mg  for 225mg  daily (Patient not taking: No sig reported)   No facility-administered encounter medications on file as of 11/29/2020.    Allergies (verified) Penicillins, Clindamycin, Ciprofloxacin, and Morphine   History: Past Medical History:  Diagnosis Date   Anticoagulant long-term use    Anxiety    Autoimmune hypothyroidism    Chronic hypokalemia    Chronic pain    Chronic, continuous use of opioids    Depression    Diverticulosis    Fatigue    Fibromyalgia    Herpes labialis    Herpes simplex    History of pulmonary embolus (PE)    History of pulmonary hypertension    Hypertension    Lupus (systemic lupus erythematosus) (HCC)    Neuromuscular disorder (HCC)    OSA on CPAP    Osteoporosis    Venous ulcers of both lower extremities (HCC) 06/17/2016   Vitamin D deficiency    Past Surgical History:  Procedure Laterality Date   CESAREAN SECTION     CHOLECYSTECTOMY     COLONOSCOPY WITH PROPOFOL N/A 07/16/2016   Procedure: COLONOSCOPY WITH PROPOFOL;  Surgeon: , MD;  Location[facility] ENDOSCOPY;  Service: Endoscopy;  Laterality: N/A;   LAPAROSCOPIC GASTRIC SLEEVE RESECTION     Family History  Problem Relation Age of Onset   Arthritis Mother    Osteoarthritis Mother    Lupus Mother    Hypothyroidism Mother    Osteoarthritis Father    Emphysema Father    Coronary artery disease Father    Social History   Socioeconomic History   Marital status: Legally Separated    Spouse name: Not on file   Number of children: 2   Years of education: 12   Highest education level: High school graduate  Occupational  History   Occupation: disability   Tobacco Use   Smoking status: Never   Smokeless tobacco: Never  Vaping Use   Vaping Use: Never used  Substance and Sexual Activity   Alcohol use: No   Drug use: No   Sexual activity: Yes    Birth control/protection: Post-menopausal  Other Topics Concern   Not on file  Social History Narrative   Not on file   Social Determinants of Health   Financial Resource Strain: Low Risk    Difficulty of Paying Living Expenses: Not hard at all  Food Insecurity: No Food Insecurity   Worried About 08/17/2016 in the Last Year: Never true   Ran Out of Food in the Last Year: Never  true  Transportation Needs: No Transportation Needs   Lack of Transportation (Medical): No   Lack of Transportation (Non-Medical): No  Physical Activity: Insufficiently Active   Days of Exercise per Week: 3 days   Minutes of Exercise per Session: 30 min  Stress: Stress Concern Present   Feeling of Stress : To some extent  Social Connections: Socially Isolated   Frequency of Communication with Friends and Family: Twice a week   Frequency of Social Gatherings with Friends and Family: Once a week   Attends Religious Services: Never   Database administrator or Organizations: No   Attends Banker Meetings: Never   Marital Status: Divorced    Tobacco Counseling Counseling given: Yes   Clinical Intake:   How often do you need to have someone help you when you read instructions, pamphlets, or other written materials from your doctor or pharmacy?: 1 - Never What is the last grade level you completed in school?: 12  Diabetic?No    Activities of Daily Living In your present state of health, do you have any difficulty performing the following activities: 11/29/2020 12/04/2019  Hearing? Y N  Vision? Y Y  Difficulty concentrating or making decisions? N N  Walking or climbing stairs? Y N  Dressing or bathing? N N  Doing errands, shopping? N N  Some recent  data might be hidden    Patient Care Team: Dorcas Carrow, DO as PCP - General (Family Medicine) Wyn Quaker, Marlow Baars, MD as Referring Physician (Vascular Surgery) Marcille Blanco, MD as Referring Physician (Surgery) Brayton El, MD as Referring Physician (Rheumatology) Gustavus Bryant, LCSW as Social Worker (Licensed Clinical Social Worker)  Indicate any recent Medical Services you may have received from other than [facility] providers in the past year (date may be approximate).     Assessment:   This is a routine wellness examination for Dessiree.  Hearing/Vision screen No results found.  Dietary issues and exercise activities discussed: Current Exercise Habits: Home exercise routine, Type of exercise: treadmill, Time (Minutes): 30, Frequency (Times/Week): 3, Weekly Exercise (Minutes/Week): 90, Intensity: Moderate   Goals Addressed   None   Depression Screen PHQ 2/9 Scores 11/29/2020 07/10/2020 05/29/2020 01/01/2020 12/04/2019 11/26/2019 05/31/2019  PHQ - 2 Score 3 4 5  0 0 0 0  PHQ- 9 Score 6 13 13  0 2 - 0    Fall Risk Fall Risk  11/29/2020 01/01/2020 12/04/2019 11/26/2019 05/31/2019  Falls in the past year? 0 0 0 1 0  Comment - - - lost balance trying to turn fast -  Number falls in past yr: 0 0 1 1 0  Injury with Fall? 0 0 0 0 0  Risk for fall due to : History of fall(s) No Fall Risks Impaired balance/gait Impaired balance/gait;Medication side effect -  Follow up Falls evaluation completed Falls evaluation completed Falls evaluation completed;Education provided Falls evaluation completed;Education provided;Falls prevention discussed -    FALL RISK PREVENTION PERTAINING TO THE HOME:  Any stairs in or around the home? No  If so, are there any without handrails? No  Home free of loose throw rugs in walkways, pet beds, electrical cords, etc? Yes  Adequate lighting in your home to reduce risk of falls? Yes   ASSISTIVE DEVICES UTILIZED TO PREVENT FALLS:  Life alert? No  Use of a cane, walker  or w/c? No  Grab bars in the bathroom? No  Shower chair or bench in shower? No  Elevated toilet seat or a handicapped toilet?  No    Cognitive Function:     6CIT Screen 11/29/2020 11/26/2019 08/22/2017 08/20/2016  What Year? 0 points 0 points 0 points 0 points  What month? 0 points 0 points 0 points 0 points  What time? 0 points 0 points 0 points 0 points  Count back from 20 0 points 0 points 0 points 0 points  Months in reverse 0 points 0 points 0 points 0 points  Repeat phrase 0 points 0 points 0 points 4 points  Total Score 0 0 0 4    Immunizations Immunization History  Administered Date(s) Administered   Influenza Inj Mdck Quad Pf 12/13/2017   Influenza,inj,Quad PF,6+ Mos 12/10/2016, 11/09/2018, 12/04/2019   Influenza-Unspecified 12/18/2013, 12/10/2016   PFIZER(Purple Top)SARS-COV-2 Vaccination 06/16/2019, 07/11/2019   Pneumococcal Conjugate-13 05/04/2017   Pneumococcal Polysaccharide-23 02/24/2010   Tdap 08/22/2015    TDAP status: Up to date  Flu Vaccine status: Due, Education has been provided regarding the importance of this vaccine. Advised may receive this vaccine at local pharmacy or Health Dept. Aware to provide a copy of the vaccination record if obtained from local pharmacy or Health Dept. Verbalized acceptance and understanding.  Pneumococcal vaccine status: Due, Education has been provided regarding the importance of this vaccine. Advised may receive this vaccine at local pharmacy or Health Dept. Aware to provide a copy of the vaccination record if obtained from local pharmacy or Health Dept. Verbalized acceptance and understanding.  Covid-19 vaccine status: Information provided on how to obtain vaccines.   Qualifies for Shingles Vaccine? Yes   Zostavax completed No   Shingrix Completed?: No.    Education has been provided regarding the importance of this vaccine. Patient has been advised to call insurance company to determine out of pocket expense if they have not  yet received this vaccine. Advised may also receive vaccine at local pharmacy or Health Dept. Verbalized acceptance and understanding.  Screening Tests Health Maintenance  Topic Date Due   Zoster Vaccines- Shingrix (1 of 2) Never done   MAMMOGRAM  Never done   COVID-19 Vaccine (3 - Pfizer risk series) 08/08/2019   INFLUENZA VACCINE  10/06/2020   COLONOSCOPY (Pts 45-48yrs Insurance coverage will need to be confirmed)  07/16/2021   PAP SMEAR-Modifier  08/20/2021   TETANUS/TDAP  08/21/2025   Hepatitis C Screening  Completed   HIV Screening  Completed   HPV VACCINES  Aged Out    Health Maintenance  Health Maintenance Due  Topic Date Due   Zoster Vaccines- Shingrix (1 of 2) Never done   MAMMOGRAM  Never done   COVID-19 Vaccine (3 - Pfizer risk series) 08/08/2019   INFLUENZA VACCINE  10/06/2020    Colorectal cancer screening: Type of screening: Colonoscopy. Completed 07/16/2016. Repeat every 5 years  Mammogram Status: Patient have not completed. Patient informed to call PCP office.  Bone Density Status:Patient have not completed. Patient informed to call her PCP office.  Lung Cancer Screening: (Low Dose CT Chest recommended if Age 35-80 years, 30 pack-year currently smoking OR have quit w/in 15years.) does not qualify.    Additional Screening:  Hepatitis C Screening: does qualify; Completed 08/24/2017  Vision Screening: Recommended annual ophthalmology exams for early detection of glaucoma and other disorders of the eye. Is the patient up to date with their annual eye exam?  Yes  Who is the provider or what is the name of the office in which the patient attends annual eye exams? [facility] If pt is not established with a provider,  would they like to be referred to a provider to establish care? No .   Dental Screening: Recommended annual dental exams for proper oral hygiene  Community Resource Referral / Chronic Care Management: CRR required this visit?  No   CCM  required this visit?  No      Plan:     I have personally reviewed and noted the following in the patient's chart:   Medical and social history Use of alcohol, tobacco or illicit drugs  Current medications and supplements including opioid prescriptions.  Functional ability and status Nutritional status Physical activity Advanced directives List of other physicians Hospitalizations, surgeries, and ER visits in previous 12 months Vitals Screenings to include cognitive, depression, and falls Referrals and appointments  In addition, I have reviewed and discussed with patient certain preventive protocols, quality metrics, and best practice recommendations. A written personalized care plan for preventive services as well as general preventive health recommendations were provided to patient.     Kaylee Fox [place], [place]   11/29/2020   Nurse Notes: None face to face,  Patient visit was completed visit was completed by telephone. Patient was not accompanied by anyone. Patient verbalized understanding during entire visit. Patient was informed to please contact PCP office to completed vaccines and screenings that are needing to be completed. Patient verbalized understanding and agreed.  Per pt she feels stressed somedays due to daughter that moved into her house and got on drugs and she oved and now she have to care for grandchild that's 48yrs.

## 2020-12-03 ENCOUNTER — Ambulatory Visit (INDEPENDENT_AMBULATORY_CARE_PROVIDER_SITE_OTHER): Payer: Medicare HMO | Admitting: Nurse Practitioner

## 2020-12-03 ENCOUNTER — Other Ambulatory Visit: Payer: Self-pay

## 2020-12-03 ENCOUNTER — Encounter: Payer: Self-pay | Admitting: Nurse Practitioner

## 2020-12-03 VITALS — BP 126/76 | HR 71 | Ht 63.0 in | Wt 273.0 lb

## 2020-12-03 DIAGNOSIS — F419 Anxiety disorder, unspecified: Secondary | ICD-10-CM

## 2020-12-03 MED ORDER — BUSPIRONE HCL 5 MG PO TABS: 5.0000 mg | ORAL_TABLET | Freq: Two times a day (BID) | ORAL | 3 refills | Status: DC

## 2020-12-03 NOTE — Assessment & Plan Note (Signed)
Chronic. Patient is caring for her 59 year old grandson.  Already on Wellbutrin 150mg  daily and Effexor 150 and 75mg .  Increase the Wellbutrin in the past worsened her anxiety.  Will add Buspar 5mg  BID. Discussed with patient that she can take it PRN or BID if it is helping with her anxiety.  Patient agrees to starting medication. Side effects and benefits of medication discussed with patient during visit.  Follow up in 1 month.

## 2020-12-03 NOTE — Progress Notes (Signed)
BP 126/76   Pulse 71   Ht 5\' 3"  (1.6 m)   Wt 273 lb (123.8 kg)   BMI 48.36 kg/m    Subjective:    Patient ID: Kaylee Fox, female    DOB: August 08, 1960, 60 y.o.   MRN: [id number]  HPI: Kaylee Fox is a 60 y.o. female  Chief Complaint  Patient presents with   Anxiety   ANXIETY Patient states her daughter moved here recently with her 80 year old son who autism.  She states she now has custody of her grandson.  Patient states she feels jittery and on edge.    GAD 7 : Generalized Anxiety Score 12/03/2020 11/29/2020 07/10/2020 01/01/2020  Nervous, Anxious, on Edge 3 2 1  0  Control/stop worrying 3 2 1  0  Worry too much - different things 1 2 1  0  Trouble relaxing 1 2 1  0  Restless 1 2 0 0  Easily annoyed or irritable 1 2 1  0  Afraid - awful might happen 1 0 0 0  Total GAD 7 Score 11 12 5  0  Anxiety Difficulty - Very difficult Somewhat difficult Not difficult at all       Relevant past medical, surgical, family and social history reviewed and updated as indicated. Interim medical history since our last visit reviewed. Allergies and medications reviewed and updated.  Review of Systems  Psychiatric/Behavioral:  The patient is nervous/anxious.    Per HPI unless specifically indicated above     Objective:    BP 126/76   Pulse 71   Ht 5\' 3"  (1.6 m)   Wt 273 lb (123.8 kg)   BMI 48.36 kg/m   Wt Readings from Last 3 Encounters:  12/03/20 273 lb (123.8 kg)  07/10/20 273 lb (123.8 kg)  05/29/20 275 lb 3.2 oz (124.8 kg)    Physical Exam Vitals and nursing note reviewed.  Constitutional:      General: She is not in acute distress.    Appearance: Normal appearance. She is obese. She is not ill-appearing, toxic-appearing or diaphoretic.  HENT:     Head: Normocephalic.     Right Ear: External ear normal.     Left Ear: External ear normal.     Nose: Nose normal.     Mouth/Throat:     Mouth: Mucous membranes are moist.     Pharynx: Oropharynx is clear.  Eyes:      General:        Right eye: No discharge.        Left eye: No discharge.     Extraocular Movements: Extraocular movements intact.     Conjunctiva/sclera: Conjunctivae normal.     Pupils: Pupils are equal, round, and reactive to light.  Cardiovascular:     Rate and Rhythm: Normal rate and regular rhythm.     Heart sounds: No murmur heard. Pulmonary:     Effort: Pulmonary effort is normal. No respiratory distress.     Breath sounds: Normal breath sounds. No wheezing or rales.  Musculoskeletal:     Cervical back: Normal range of motion and neck supple.  Skin:    General: Skin is warm and dry.     Capillary Refill: Capillary refill takes less than 2 seconds.  Neurological:     General: No focal deficit present.     Mental Status: She is alert and oriented to person, place, and time. Mental status is at baseline.  Psychiatric:        Mood and Affect: Mood normal.  Behavior: Behavior normal.        Thought Content: Thought content normal.        Judgment: Judgment normal.    Results for orders placed or performed in visit on 05/29/20  CBC with Differential/Platelet  Result Value Ref Range   WBC 4.9 3.4 - 10.8 x10E3/uL   RBC 3.93 3.77 - 5.28 x10E6/uL   Hemoglobin 12.4 11.1 - 15.9 g/dL   Hematocrit 38.1 34.0 - 46.6 %   MCV 97 79 - 97 fL   MCH 31.6 26.6 - 33.0 pg   MCHC 32.5 31.5 - 35.7 g/dL   RDW 12.5 11.7 - 15.4 %   Platelets 227 150 - 450 x10E3/uL   Neutrophils 56 Not Estab. %   Lymphs 37 Not Estab. %   Monocytes 7 Not Estab. %   Eos 0 Not Estab. %   Basos 0 Not Estab. %   Neutrophils Absolute 2.7 1.4 - 7.0 x10E3/uL   Lymphocytes Absolute 1.8 0.7 - 3.1 x10E3/uL   Monocytes Absolute 0.3 0.1 - 0.9 x10E3/uL   EOS (ABSOLUTE) 0.0 0.0 - 0.4 x10E3/uL   Basophils Absolute 0.0 0.0 - 0.2 x10E3/uL   Immature Granulocytes 0 Not Estab. %   Immature Grans (Abs) 0.0 0.0 - 0.1 x10E3/uL  Comprehensive metabolic panel  Result Value Ref Range   Glucose 94 65 - 99 mg/dL   BUN 11 6 -  24 mg/dL   Creatinine, Ser 0.68 0.57 - 1.00 mg/dL   eGFR 101 >59 mL/min/1.73   BUN/Creatinine Ratio 16 9 - 23   Sodium 140 134 - 144 mmol/L   Potassium 4.0 3.5 - 5.2 mmol/L   Chloride 103 96 - 106 mmol/L   CO2 22 20 - 29 mmol/L   Calcium 9.1 8.7 - 10.2 mg/dL   Total Protein 7.3 6.0 - 8.5 g/dL   Albumin 4.1 3.8 - 4.9 g/dL   Globulin, Total 3.2 1.5 - 4.5 g/dL   Albumin/Globulin Ratio 1.3 1.2 - 2.2   Bilirubin Total 0.3 0.0 - 1.2 mg/dL   Alkaline Phosphatase 104 44 - 121 IU/L   AST 23 0 - 40 IU/L   ALT 16 0 - 32 IU/L  Lipid Panel w/o Chol/HDL Ratio  Result Value Ref Range   Cholesterol, Total 175 100 - 199 mg/dL   Triglycerides 85 0 - 149 mg/dL   HDL 52 >39 mg/dL   VLDL Cholesterol Cal 16 5 - 40 mg/dL   LDL Chol Calc (NIH) 107 (H) 0 - 99 mg/dL  VITAMIN D 25 Hydroxy (Vit-D Deficiency, Fractures)  Result Value Ref Range   Vit D, 25-Hydroxy 26.1 (L) 30.0 - 100.0 ng/mL  TSH  Result Value Ref Range   TSH 1.670 0.450 - 4.500 uIU/mL  Bayer DCA Hb A1c Waived  Result Value Ref Range   HB A1C (BAYER DCA - WAIVED) 5.0 <7.0 %      Assessment & Plan:   Problem List Items Addressed This Visit       Other   Anxiety - Primary    Chronic. Patient is caring for her 56 year old grandson.  Already on Wellbutrin $RemoveBefor'150mg'FojyMgODYPTV$  daily and Effexor 150 and $Remov'75mg'kgCsVR$ .  Increase the Wellbutrin in the past worsened her anxiety.  Will add Buspar $RemoveBe'5mg'ilujoHzoF$  BID. Discussed with patient that she can take it PRN or BID if it is helping with her anxiety.  Patient agrees to starting medication. Side effects and benefits of medication discussed with patient during visit.  Follow up in 1 month.  Relevant Medications   busPIRone (BUSPAR) 5 MG tablet     Follow up plan: Return in about 1 month (around 01/02/2021) for Anxiety FU.

## 2020-12-08 DIAGNOSIS — M329 Systemic lupus erythematosus, unspecified: Secondary | ICD-10-CM | POA: Diagnosis not present

## 2020-12-08 DIAGNOSIS — M75102 Unspecified rotator cuff tear or rupture of left shoulder, not specified as traumatic: Secondary | ICD-10-CM | POA: Diagnosis not present

## 2020-12-08 DIAGNOSIS — Z23 Encounter for immunization: Secondary | ICD-10-CM | POA: Diagnosis not present

## 2020-12-08 DIAGNOSIS — Z79899 Other long term (current) drug therapy: Secondary | ICD-10-CM | POA: Diagnosis not present

## 2020-12-08 DIAGNOSIS — M5030 Other cervical disc degeneration, unspecified cervical region: Secondary | ICD-10-CM | POA: Diagnosis not present

## 2020-12-24 ENCOUNTER — Other Ambulatory Visit: Payer: Self-pay | Admitting: Family Medicine

## 2020-12-25 NOTE — Telephone Encounter (Signed)
Requested Prescriptions  Pending Prescriptions Disp Refills  . venlafaxine XR (EFFEXOR-XR) 75 MG 24 hr capsule [Pharmacy Med Name: VENLAFAXINE HYDROCHLORIDEER 75 MG Capsule Extended Release 24 Hour] 90 capsule 1    Sig: TAKE 1 CAPSULE EVERY DAY WITH BREAKFAST WITH THE 150MG  FOR 225MG  DAILY     Psychiatry: Antidepressants - SNRI - desvenlafaxine & venlafaxine Failed - 12/24/2020  5:13 PM      Failed - LDL in normal range and within 360 days    LDL Chol Calc (NIH)  Date Value Ref Range Status  05/29/2020 107 (H) 0 - 99 mg/dL Final         Passed - Total Cholesterol in normal range and within 360 days    Cholesterol, Total  Date Value Ref Range Status  05/29/2020 175 100 - 199 mg/dL Final         Passed - Triglycerides in normal range and within 360 days    Triglycerides  Date Value Ref Range Status  05/29/2020 85 0 - 149 mg/dL Final         Passed - Completed PHQ-2 or PHQ-9 in the last 360 days      Passed - Last BP in normal range    BP Readings from Last 1 Encounters:  12/03/20 126/76         Passed - Valid encounter within last 6 months    Recent Outpatient Visits          3 weeks ago Anxiety   [facility] 12/05/20, NP   5 months ago Nasal congestion   [facility] [place], [place], DO   7 months ago Autoimmune hypothyroidism   [facility] [place], Megan P, DO   9 months ago Chronic pain of left knee   [facility] SAN REM[place], NP   11 months ago Allergic contact dermatitis, unspecified trigger   [facility] [place], DO      Future Appointments            In 2 weeks SAN REMO, Oralia Rud, DO [facility], [facility]

## 2021-01-03 ENCOUNTER — Other Ambulatory Visit: Payer: Self-pay | Admitting: Family Medicine

## 2021-01-03 NOTE — Telephone Encounter (Signed)
Furosemide: 05/29/20 #360 1 RF Singulair: 05/29/20 #90 1 RF Hydroxyzine: 05/29/20 #90 1 RF Amlodipine: 05/29/20 #90 1 RF Potassium 20 mEq: 05/29/20 #270 1 RF Pantoprazole: 05/29/20  # 90 1 RF Eliquis : 05/29/20 #180 1 RF  Requested Prescriptions  Pending Prescriptions Disp Refills   furosemide (LASIX) 40 MG tablet [Pharmacy Med Name: FUROSEMIDE 40 MG Tablet] 360 tablet 0    Sig: TAKE 2 TABLETS (80 MG TOTAL) BY MOUTH 2 (TWO) TIMES DAILY.     Cardiovascular:  Diuretics - Loop Passed - 01/03/2021 12:28 PM      Passed - K in normal range and within 360 days    Potassium  Date Value Ref Range Status  05/29/2020 4.0 3.5 - 5.2 mmol/L Final          Passed - Ca in normal range and within 360 days    Calcium  Date Value Ref Range Status  05/29/2020 9.1 8.7 - 10.2 mg/dL Final          Passed - Na in normal range and within 360 days    Sodium  Date Value Ref Range Status  05/29/2020 140 134 - 144 mmol/L Final          Passed - Cr in normal range and within 360 days    Creatinine, Ser  Date Value Ref Range Status  05/29/2020 0.68 0.57 - 1.00 mg/dL Final          Passed - Last BP in normal range    BP Readings from Last 1 Encounters:  12/03/20 126/76          Passed - Valid encounter within last 6 months    Recent Outpatient Visits           1 month ago Anxiety   [facility] Larae Grooms, NP   5 months ago Nasal congestion   [facility] [place], [place], DO   7 months ago Autoimmune hypothyroidism   [facility] [place], Megan P, DO   9 months ago Chronic pain of left knee   [facility] Gabriel Cirri, NP   1 year ago Allergic contact dermatitis, unspecified trigger   [facility] [place], Megan P, DO       Future Appointments             In 1 week Johnson, Megan P, DO [facility]             montelukast (SINGULAIR) 10 MG tablet [Pharmacy Med Name: MONTELUKAST SODIUM 10 MG  Tablet] 90 tablet 0    Sig: TAKE 1 TABLET (10 MG TOTAL) BY MOUTH AT BEDTIME.     Pulmonology:  Leukotriene Inhibitors Passed - 01/03/2021 12:28 PM      Passed - Valid encounter within last 12 months    Recent Outpatient Visits           1 month ago Anxiety   [facility] Larae Grooms, NP   5 months ago Nasal congestion   [facility] [place], [place], DO   7 months ago Autoimmune hypothyroidism   [facility] [place], Megan P, DO   9 months ago Chronic pain of left knee   [facility] Gabriel Cirri, NP   1 year ago Allergic contact dermatitis, unspecified trigger   [facility] Dorcas [place], DO       Future Appointments             In  1 week Laural Benes, Megan P, DO [facility]             hydrOXYzine (ATARAX/VISTARIL) 25 MG tablet [Pharmacy Med Name: HYDROXYZINE HYDROCHLORIDE 25 MG Tablet] 90 tablet 0    Sig: TAKE 1 TABLET (25 MG TOTAL) BY MOUTH AT BEDTIME AS NEEDED.     Ear, Nose, and Throat:  Antihistamines Passed - 01/03/2021 12:28 PM      Passed - Valid encounter within last 12 months    Recent Outpatient Visits           1 month ago Anxiety   [facility] Larae Grooms, NP   5 months ago Nasal congestion   [facility] [place], [place], DO   7 months ago Autoimmune hypothyroidism   [facility] [place], Megan P, DO   9 months ago Chronic pain of left knee   [facility] Gabriel Cirri, NP   1 year ago Allergic contact dermatitis, unspecified trigger   [facility] [place], Megan P, DO       Future Appointments             In 1 week Johnson, Megan P, DO [facility]             amLODipine (NORVASC) 10 MG tablet [Pharmacy Med Name: AMLODIPINE BESYLATE 10 MG Tablet] 90 tablet 0    Sig: TAKE 1 TABLET EVERY DAY     Cardiovascular:  Calcium Channel Blockers Passed - 01/03/2021 12:28 PM       Passed - Last BP in normal range    BP Readings from Last 1 Encounters:  12/03/20 126/76          Passed - Valid encounter within last 6 months    Recent Outpatient Visits           1 month ago Anxiety   [facility] Larae Grooms, NP   5 months ago Nasal congestion   [facility] [place], [place], DO   7 months ago Autoimmune hypothyroidism   [facility] [place], Megan P, DO   9 months ago Chronic pain of left knee   [facility] Gabriel Cirri, NP   1 year ago Allergic contact dermatitis, unspecified trigger   [facility] [place], Megan P, DO       Future Appointments             In 1 week Johnson, Megan P, DO [facility]             potassium chloride SA (KLOR-CON) 20 MEQ tablet [employer] Med Name: POTASSIUM CHLORIDE ER 20 MEQ Tablet Extended Release] 90 tablet 0    Sig: TAKE 1 TABLET EVERY DAY     Endocrinology:  Minerals - Potassium Supplementation Passed - 01/03/2021 12:28 PM      Passed - K in normal range and within 360 days    Potassium  Date Value Ref Range Status  05/29/2020 4.0 3.5 - 5.2 mmol/L Final          Passed - Cr in normal range and within 360 days    Creatinine, Ser  Date Value Ref Range Status  05/29/2020 0.68 0.57 - 1.00 mg/dL Final          Passed - Valid encounter within last 12 months    Recent Outpatient Visits           1 month ago Anxiety   [facility] Larae Grooms, NP   5 months ago Nasal congestion   [facility] [place], [place], DO   7 months ago Autoimmune hypothyroidism   [facility] [place], [place], DO   9 months ago Chronic pain of left knee   [facility] Gabriel Cirri, NP   1 year ago Allergic contact dermatitis, unspecified trigger   [facility] [place], Megan P, DO       Future Appointments             In 1 week Johnson, Megan P, DO [facility]             baclofen (LIORESAL) 10 MG tablet [employer] Med Name: BACLOFEN 10 MG Tablet] 270 tablet     Sig: TAKE 1 TABLET THREE TIMES DAILY     Not Delegated - Analgesics:  Muscle Relaxants Failed - 01/03/2021 12:28 PM      Failed - This refill cannot be delegated      Passed - Valid encounter within last 6 months    Recent Outpatient Visits           1 month ago Anxiety   [facility] Larae Grooms, NP   5 months ago Nasal congestion   [facility] [place], [place], DO   7 months ago Autoimmune hypothyroidism   [facility] [place], Megan P, DO   9 months ago Chronic pain of left knee   [facility] Gabriel Cirri, NP   1 year ago Allergic contact dermatitis, unspecified trigger   [facility] [place], Megan P, DO       Future Appointments             In 1 week Johnson, Megan P, DO [facility]             pantoprazole (PROTONIX) 40 MG tablet [Pharmacy Med Name: PANTOPRAZOLE SODIUM 40 MG Tablet Delayed Release] 90 tablet 0    Sig: TAKE 1 TABLET EVERY DAY     Gastroenterology: Proton Pump Inhibitors Passed - 01/03/2021 12:28 PM      Passed - Valid encounter within last 12 months    Recent Outpatient Visits           1 month ago Anxiety   [facility] Larae Grooms, NP   5 months ago Nasal congestion   [facility] [place], [place], DO   7 months ago Autoimmune hypothyroidism   [employer], Megan P, DO   9 months ago Chronic pain of left knee   [facility] Gabriel Cirri, NP   1 year ago Allergic contact dermatitis, unspecified trigger   [facility] [place], Megan P, DO       Future Appointments             In 1 week Johnson, Megan P, DO [facility]             ELIQUIS 5 MG TABS tablet [Pharmacy Med Name: ELIQUIS 5 MG Tablet] 180 tablet 0    Sig: TAKE 1 TABLET  TWICE DAILY     Hematology:  Anticoagulants Passed - 01/03/2021 12:28 PM      Passed - HGB in normal range and within 360 days    Hemoglobin  Date Value Ref Range Status  05/29/2020 12.4 11.1 - 15.9 g/dL Final          Passed - PLT in normal  range and within 360 days    Platelets  Date Value Ref Range Status  05/29/2020 227 150 - 450 x10E3/uL Final          Passed - HCT in normal range and within 360 days    Hematocrit  Date Value Ref Range Status  05/29/2020 38.1 34.0 - 46.6 % Final          Passed - Cr in normal range and within 360 days    Creatinine, Ser  Date Value Ref Range Status  05/29/2020 0.68 0.57 - 1.00 mg/dL Final          Passed - Valid encounter within last 12 months    Recent Outpatient Visits           1 month ago Anxiety   [facility] Larae Grooms, NP   5 months ago Nasal congestion   [facility] [place], [place], DO   7 months ago Autoimmune hypothyroidism   [facility] [place], Megan P, DO   9 months ago Chronic pain of left knee   [facility] Gabriel Cirri, NP   1 year ago Allergic contact dermatitis, unspecified trigger   [facility] [place], Oralia Rud, DO       Future Appointments             In 1 week Laural Benes, Oralia Rud, DO [facility]

## 2021-01-12 ENCOUNTER — Ambulatory Visit (INDEPENDENT_AMBULATORY_CARE_PROVIDER_SITE_OTHER): Payer: Medicare HMO | Admitting: Family Medicine

## 2021-01-12 ENCOUNTER — Other Ambulatory Visit: Payer: Self-pay

## 2021-01-12 ENCOUNTER — Encounter: Payer: Self-pay | Admitting: Family Medicine

## 2021-01-12 VITALS — HR 73 | Wt 273.0 lb

## 2021-01-12 DIAGNOSIS — D6869 Other thrombophilia: Secondary | ICD-10-CM | POA: Insufficient documentation

## 2021-01-12 DIAGNOSIS — E782 Mixed hyperlipidemia: Secondary | ICD-10-CM | POA: Diagnosis not present

## 2021-01-12 DIAGNOSIS — E559 Vitamin D deficiency, unspecified: Secondary | ICD-10-CM

## 2021-01-12 DIAGNOSIS — I2721 Secondary pulmonary arterial hypertension: Secondary | ICD-10-CM

## 2021-01-12 DIAGNOSIS — R4789 Other speech disturbances: Secondary | ICD-10-CM

## 2021-01-12 DIAGNOSIS — Z0000 Encounter for general adult medical examination without abnormal findings: Secondary | ICD-10-CM | POA: Diagnosis not present

## 2021-01-12 DIAGNOSIS — F331 Major depressive disorder, recurrent, moderate: Secondary | ICD-10-CM

## 2021-01-12 DIAGNOSIS — I129 Hypertensive chronic kidney disease with stage 1 through stage 4 chronic kidney disease, or unspecified chronic kidney disease: Secondary | ICD-10-CM | POA: Diagnosis not present

## 2021-01-12 DIAGNOSIS — M329 Systemic lupus erythematosus, unspecified: Secondary | ICD-10-CM

## 2021-01-12 DIAGNOSIS — E063 Autoimmune thyroiditis: Secondary | ICD-10-CM

## 2021-01-12 DIAGNOSIS — G709 Myoneural disorder, unspecified: Secondary | ICD-10-CM

## 2021-01-12 DIAGNOSIS — I209 Angina pectoris, unspecified: Secondary | ICD-10-CM

## 2021-01-12 DIAGNOSIS — Z636 Dependent relative needing care at home: Secondary | ICD-10-CM

## 2021-01-12 DIAGNOSIS — F419 Anxiety disorder, unspecified: Secondary | ICD-10-CM | POA: Diagnosis not present

## 2021-01-12 LAB — MICROALBUMIN, URINE WAIVED
Creatinine, Urine Waived: 100 mg/dL (ref 10–300)
Microalb, Ur Waived: 30 mg/L — ABNORMAL HIGH (ref 0–19)
Microalb/Creat Ratio: <30 mg/g (ref <30)

## 2021-01-12 LAB — URINALYSIS, ROUTINE W REFLEX MICROSCOPIC
Bilirubin, UA: NEGATIVE
Glucose, UA: NEGATIVE
Ketones, UA: NEGATIVE
Nitrite, UA: NEGATIVE
Protein,UA: NEGATIVE
RBC, UA: NEGATIVE
Specific Gravity, UA: 1.025 (ref 1.005–1.030)
Urobilinogen, Ur: 1 mg/dL (ref 0.2–1.0)
pH, UA: 5 (ref 5.0–7.5)

## 2021-01-12 LAB — MICROSCOPIC EXAMINATION
Bacteria, UA: NONE SEEN
Epithelial Cells (non renal): NONE SEEN /hpf (ref 0–10)
RBC, Urine: NONE SEEN /hpf (ref 0–2)

## 2021-01-12 LAB — BAYER DCA HB A1C WAIVED: HB A1C (BAYER DCA - WAIVED): 5.1 % (ref 4.8–5.6)

## 2021-01-12 MED ORDER — HYDROXYZINE HCL 25 MG PO TABS: 25.0000 mg | ORAL_TABLET | Freq: Every evening | ORAL | 1 refills | Status: DC | PRN

## 2021-01-12 MED ORDER — APIXABAN 5 MG PO TABS: 5.0000 mg | ORAL_TABLET | Freq: Two times a day (BID) | ORAL | 1 refills | Status: DC

## 2021-01-12 MED ORDER — BACLOFEN 10 MG PO TABS: 10.0000 mg | ORAL_TABLET | Freq: Three times a day (TID) | ORAL | 1 refills | Status: DC

## 2021-01-12 MED ORDER — MONTELUKAST SODIUM 10 MG PO TABS: 10.0000 mg | ORAL_TABLET | Freq: Every day | ORAL | 1 refills | Status: DC

## 2021-01-12 MED ORDER — GABAPENTIN 300 MG PO CAPS: 600.0000 mg | ORAL_CAPSULE | Freq: Two times a day (BID) | ORAL | 1 refills | Status: DC

## 2021-01-12 MED ORDER — PANTOPRAZOLE SODIUM 40 MG PO TBEC: 40.0000 mg | DELAYED_RELEASE_TABLET | Freq: Every day | ORAL | 1 refills | Status: DC

## 2021-01-12 MED ORDER — VENLAFAXINE HCL ER 75 MG PO CP24: 75.0000 mg | ORAL_CAPSULE | Freq: Every day | ORAL | 1 refills | Status: DC

## 2021-01-12 MED ORDER — AMLODIPINE BESYLATE 10 MG PO TABS: 10.0000 mg | ORAL_TABLET | Freq: Every day | ORAL | 1 refills | Status: DC

## 2021-01-12 MED ORDER — ATORVASTATIN CALCIUM 40 MG PO TABS: 40.0000 mg | ORAL_TABLET | Freq: Every day | ORAL | 1 refills | Status: DC

## 2021-01-12 MED ORDER — BUSPIRONE HCL 5 MG PO TABS: 5.0000 mg | ORAL_TABLET | Freq: Two times a day (BID) | ORAL | 1 refills | Status: DC

## 2021-01-12 MED ORDER — ALBUTEROL SULFATE HFA 108 (90 BASE) MCG/ACT IN AERS: 2.0000 | INHALATION_SPRAY | Freq: Four times a day (QID) | RESPIRATORY_TRACT | 6 refills | Status: DC | PRN

## 2021-01-12 MED ORDER — BUPROPION HCL ER (XL) 150 MG PO TB24: 150.0000 mg | ORAL_TABLET | Freq: Every day | ORAL | 1 refills | Status: DC

## 2021-01-12 MED ORDER — VENLAFAXINE HCL ER 150 MG PO CP24: ORAL_CAPSULE | ORAL | 1 refills | Status: DC

## 2021-01-12 MED ORDER — POTASSIUM CHLORIDE CRYS ER 20 MEQ PO TBCR: 20.0000 meq | EXTENDED_RELEASE_TABLET | Freq: Every day | ORAL | 1 refills | Status: DC

## 2021-01-12 MED ORDER — FUROSEMIDE 40 MG PO TABS: 80.0000 mg | ORAL_TABLET | Freq: Two times a day (BID) | ORAL | 1 refills | Status: DC

## 2021-01-12 NOTE — Assessment & Plan Note (Signed)
Continue eliquis. Continue to monitor. Labs drawn today. Refills given.

## 2021-01-12 NOTE — Progress Notes (Signed)
Pulse 73   Wt 273 lb (123.8 kg)   SpO2 97%   BMI 48.36 kg/m    Subjective:    Patient ID: Kaylee Fox, female    DOB: 04-Sep-1960, 60 y.o.   MRN: [id number]  HPI: Kaylee Fox is a 60 y.o. female presenting on 01/12/2021 for comprehensive medical examination. Current medical complaints include:  ANXIETY/Depression Duration: Chronic Status:controlled Anxious mood: yes  Excessive worrying: no Irritability: no  Sweating: no Nausea: no Palpitations:no Hyperventilation: no Panic attacks: no Agoraphobia: no  Obscessions/compulsions: no Depressed mood: yes Depression screen [facility] 2/9 01/12/2021 11/29/2020 07/10/2020 05/29/2020 01/01/2020  Decreased Interest 2 1 2 2  0  Down, Depressed, Hopeless 1 2 2 3  0  PHQ - 2 Score 3 3 4 5  0  Altered sleeping 2 1 1  0 0  Tired, decreased energy 1 1 2 3  0  Change in appetite 1 1 2 3  0  Feeling bad or failure about yourself  1 0 1 0 0  Trouble concentrating 1 0 1 2 0  Moving slowly or fidgety/restless 1 0 1 0 0  Suicidal thoughts 0 0 1 0 0  PHQ-9 Score 10 6 13 13  0  Difficult doing work/chores Somewhat difficult Somewhat difficult Somewhat difficult Somewhat difficult Not difficult at all  Some recent data might be hidden   Anhedonia: no Weight changes: no Insomnia: no   Hypersomnia: no Fatigue/loss of energy: yes Feelings of worthlessness: no Feelings of guilt: no Impaired concentration/indecisiveness: no Suicidal ideations: no  Crying spells: no Recent Stressors/Life Changes: yes   Relationship problems: no   Family stress: yes     Financial stress: yes    Job stress: no    Recent death/loss: no  HYPERTENSION / HYPERLIPIDEMIA Satisfied with current treatment? yes Duration of hypertension: chronic BP monitoring frequency: not checking BP medication side effects: no Past BP meds: amlodipine, lasix Duration of hyperlipidemia: chronic Cholesterol medication side effects: no Cholesterol supplements: none Past cholesterol  medications: atorvastatin Medication compliance: excellent compliance Aspirin: no Recent stressors: no Recurrent headaches: no Visual changes: no Palpitations: yes Dyspnea: no Chest pain: no Lower extremity edema: no Dizzy/lightheaded: no  HYPOTHYROIDISM Thyroid control status:controlled Satisfied with current treatment? yes Medication side effects: no Medication compliance: excellent compliance Recent dose adjustment:no Fatigue: yes Cold intolerance: no Heat intolerance: no Weight gain: no Weight loss: no Constipation: no Diarrhea/loose stools: yes Palpitations: yes Lower extremity edema: no Anxiety/depressed mood: yes  She currently lives with: grandson Menopausal Symptoms: yes  Depression Screen done today and results listed below:  Depression screen [facility] 2/9 01/12/2021 11/29/2020 07/10/2020 05/29/2020 01/01/2020  Decreased Interest 2 1 2 2  0  Down, Depressed, Hopeless 1 2 2 3  0  PHQ - 2 Score 3 3 4 5  0  Altered sleeping 2 1 1  0 0  Tired, decreased energy 1 1 2 3  0  Change in appetite 1 1 2 3  0  Feeling bad or failure about yourself  1 0 1 0 0  Trouble concentrating 1 0 1 2 0  Moving slowly or fidgety/restless 1 0 1 0 0  Suicidal thoughts 0 0 1 0 0  PHQ-9 Score 10 6 13 13  0  Difficult doing work/chores Somewhat difficult Somewhat difficult Somewhat difficult Somewhat difficult Not difficult at all  Some recent data might be hidden    Past Medical History:  Past Medical History:  Diagnosis Date   Anticoagulant long-term use    Anxiety    Autoimmune hypothyroidism    Chronic hypokalemia  Chronic pain    Chronic, continuous use of opioids    Depression    Diverticulosis    Fatigue    Fibromyalgia    Herpes labialis    Herpes simplex    History of pulmonary embolus (PE)    History of pulmonary hypertension    Hypertension    Lupus (systemic lupus erythematosus) (HCC)    Neuromuscular disorder (HCC)    OSA on CPAP    Osteoporosis    Venous ulcers of  both lower extremities ([place]) 06/17/2016   Vitamin D deficiency     Surgical History:  Past Surgical History:  Procedure Laterality Date   CESAREAN SECTION     CHOLECYSTECTOMY     COLONOSCOPY WITH PROPOFOL N/A 07/16/2016   Procedure: COLONOSCOPY WITH PROPOFOL;  Surgeon: Jonathon Bellows, MD;  Location: [facility] ENDOSCOPY;  Service: Endoscopy;  Laterality: N/A;   LAPAROSCOPIC GASTRIC SLEEVE RESECTION      Medications:  Current Outpatient Medications on File Prior to Visit  Medication Sig   acetaminophen (TYLENOL) 325 MG tablet Take by mouth as needed.    azaTHIOprine (IMURAN) 50 MG tablet Take 100 mg by mouth daily.   calcium carbonate (OS-CAL) 600 MG TABS tablet Take by mouth.   cetirizine (ZYRTEC) 10 MG tablet Take 1 tablet (10 mg total) by mouth daily.   Cholecalciferol 25 MCG (1000 UT) capsule Take 1,000 Units by mouth daily. Takes 2 1000 mg per day   fluticasone (FLONASE) 50 MCG/ACT nasal spray USE 2 SPRAYS INTO BOTH NOSTRILS DAILY.   hydroxychloroquine (PLAQUENIL) 200 MG tablet Take 400 mg by mouth 2 (two) times daily.    levothyroxine (SYNTHROID) 75 MCG tablet TAKE 1 TABLET(75 MCG) BY MOUTH DAILY BEFORE BREAKFAST   Multiple Vitamin (MULTI-VITAMINS) TABS Take by mouth.   mupirocin cream (BACTROBAN) 2 % Apply to affected area on arms/legs twice a day as needed.   nystatin (MYCOSTATIN) 100000 UNIT/ML suspension Take 5 mLs (500,000 Units total) by mouth 4 (four) times daily.   triamcinolone cream (KENALOG) 0.1 % Apply 1 application topically 2 (two) times daily.   VOLTAREN 1 % GEL    No current facility-administered medications on file prior to visit.    Allergies:  Allergies  Allergen Reactions   Penicillins Rash   Clindamycin Nausea Only   Ciprofloxacin Other (See Comments)   Morphine Other (See Comments)    Social History:  Social History   Socioeconomic History   Marital status: Legally Separated    Spouse name: Not on file   Number of children: 2   Years of education: 12    Highest education level: High school graduate  Occupational History   Occupation: disability   Tobacco Use   Smoking status: Never   Smokeless tobacco: Never  Vaping Use   Vaping Use: Never used  Substance and Sexual Activity   Alcohol use: No   Drug use: No   Sexual activity: Yes    Birth control/protection: Post-menopausal  Other Topics Concern   Not on file  Social History Narrative   Not on file   Social Determinants of Health   Financial Resource Strain: Low Risk    Difficulty of Paying Living Expenses: Not hard at all  Food Insecurity: No Food Insecurity   Worried About Charity fundraiser in the Last Year: Never true   [place] in the Last Year: Never true  Transportation Needs: No Transportation Needs   Lack of Transportation (Medical): No   Lack of Transportation (  Non-Medical): No  Physical Activity: Insufficiently Active   Days of Exercise per Week: 3 days   Minutes of Exercise per Session: 30 min  Stress: Stress Concern Present   Feeling of Stress : To some extent  Social Connections: Socially Isolated   Frequency of Communication with Friends and Family: Twice a week   Frequency of Social Gatherings with Friends and Family: Once a week   Attends Religious Services: Never   Printmaker: No   Attends Music therapist: Never   Marital Status: Divorced  Human resources officer Violence: Not At Risk   Fear of Current or Ex-Partner: No   Emotionally Abused: No   Physically Abused: No   Sexually Abused: No   Social History   Tobacco Use  Smoking Status Never  Smokeless Tobacco Never   Social History   Substance and Sexual Activity  Alcohol Use No    Family History:  Family History  Problem Relation Age of Onset   Arthritis Mother    Osteoarthritis Mother    Lupus Mother    Hypothyroidism Mother    Osteoarthritis Father    Emphysema Father    Coronary artery disease Father     Past medical history,  surgical history, medications, allergies, family history and social history reviewed with patient today and changes made to appropriate areas of the chart.   Review of Systems  Constitutional: Negative.   HENT: Negative.    Eyes:  Positive for pain. Negative for blurred vision, double vision, photophobia, discharge and redness.  Respiratory: Negative.    Cardiovascular:  Positive for palpitations. Negative for chest pain, orthopnea, claudication, leg swelling and PND.  Gastrointestinal:  Positive for diarrhea, heartburn and nausea. Negative for abdominal pain, blood in stool, constipation, melena and vomiting.  Genitourinary: Negative.   Musculoskeletal: Negative.   Skin: Negative.   Neurological: Negative.   Endo/Heme/Allergies:  Positive for environmental allergies. Negative for polydipsia. Does not bruise/bleed easily.  Psychiatric/Behavioral: Negative.    All other ROS negative except what is listed above and in the HPI.      Objective:    Pulse 73   Wt 273 lb (123.8 kg)   SpO2 97%   BMI 48.36 kg/m   Wt Readings from Last 3 Encounters:  01/12/21 273 lb (123.8 kg)  12/03/20 273 lb (123.8 kg)  07/10/20 273 lb (123.8 kg)    Physical Exam Vitals and nursing note reviewed.  Constitutional:      General: She is not in acute distress.    Appearance: Normal appearance. She is obese. She is not ill-appearing, toxic-appearing or diaphoretic.  HENT:     Head: Normocephalic and atraumatic.     Right Ear: Tympanic membrane, ear canal and external ear normal. There is no impacted cerumen.     Left Ear: Tympanic membrane, ear canal and external ear normal. There is no impacted cerumen.     Nose: Nose normal. No congestion or rhinorrhea.     Mouth/Throat:     Mouth: Mucous membranes are moist.     Pharynx: Oropharynx is clear. No oropharyngeal exudate or posterior oropharyngeal erythema.  Eyes:     General: No scleral icterus.       Right eye: No discharge.        Left eye: No  discharge.     Extraocular Movements: Extraocular movements intact.     Conjunctiva/sclera: Conjunctivae normal.     Pupils: Pupils are equal, round, and reactive to light.  Neck:     Vascular: No carotid bruit.  Cardiovascular:     Rate and Rhythm: Normal rate and regular rhythm.     Pulses: Normal pulses.     Heart sounds: No murmur heard.   No friction rub. No gallop.  Pulmonary:     Effort: Pulmonary effort is normal. No respiratory distress.     Breath sounds: Normal breath sounds. No stridor. No wheezing, rhonchi or rales.  Chest:     Chest wall: No tenderness.  Abdominal:     General: Abdomen is flat. Bowel sounds are normal. There is no distension.     Palpations: Abdomen is soft. There is no mass.     Tenderness: There is no abdominal tenderness. There is no right CVA tenderness, left CVA tenderness, guarding or rebound.     Hernia: No hernia is present.  Genitourinary:    Comments: Breast and pelvic exams deferred with shared decision making Musculoskeletal:        General: No swelling, tenderness, deformity or signs of injury.     Cervical back: Normal range of motion and neck supple. No rigidity. No muscular tenderness.     Right lower leg: No edema.     Left lower leg: No edema.  Lymphadenopathy:     Cervical: No cervical adenopathy.  Skin:    General: Skin is warm and dry.     Capillary Refill: Capillary refill takes less than 2 seconds.     Coloration: Skin is not jaundiced or pale.     Findings: No bruising, erythema, lesion or rash.  Neurological:     General: No focal deficit present.     Mental Status: She is alert and oriented to person, place, and time. Mental status is at baseline.     Cranial Nerves: No cranial nerve deficit.     Sensory: No sensory deficit.     Motor: No weakness.     Coordination: Coordination normal.     Gait: Gait normal.     Deep Tendon Reflexes: Reflexes normal.  Psychiatric:        Mood and Affect: Mood normal.         Behavior: Behavior normal.        Thought Content: Thought content normal.        Judgment: Judgment normal.    Results for orders placed or performed in visit on 01/12/21  Microscopic Examination   Urine  Result Value Ref Range   WBC, UA 0-5 0 - 5 /hpf   RBC None seen 0 - 2 /hpf   Epithelial Cells (non renal) None seen 0 - 10 /hpf   Bacteria, UA None seen None seen/Few  CBC with Differential/Platelet  Result Value Ref Range   WBC 4.7 3.4 - 10.8 x10E3/uL   RBC 4.14 3.77 - 5.28 x10E6/uL   Hemoglobin 13.1 11.1 - 15.9 g/dL   Hematocrit 39.6 34.0 - 46.6 %   MCV 96 79 - 97 fL   MCH 31.6 26.6 - 33.0 pg   MCHC 33.1 31.5 - 35.7 g/dL   RDW 11.5 (L) 11.7 - 15.4 %   Platelets 243 150 - 450 x10E3/uL   Neutrophils 54 Not Estab. %   Lymphs 36 Not Estab. %   Monocytes 10 Not Estab. %   Eos 0 Not Estab. %   Basos 0 Not Estab. %   Neutrophils Absolute 2.6 1.4 - 7.0 x10E3/uL   Lymphocytes Absolute 1.7 0.7 - 3.1 x10E3/uL   Monocytes Absolute 0.5  0.1 - 0.9 x10E3/uL   EOS (ABSOLUTE) 0.0 0.0 - 0.4 x10E3/uL   Basophils Absolute 0.0 0.0 - 0.2 x10E3/uL   Immature Granulocytes 0 Not Estab. %   Immature Grans (Abs) 0.0 0.0 - 0.1 x10E3/uL  Comprehensive metabolic panel  Result Value Ref Range   Glucose 93 70 - 99 mg/dL   BUN 12 6 - 24 mg/dL   Creatinine, Ser 3.45 0.57 - 1.00 mg/dL   eGFR 81 >17 YA/GRG/1.84   BUN/Creatinine Ratio 14 9 - 23   Sodium 141 134 - 144 mmol/L   Potassium 4.0 3.5 - 5.2 mmol/L   Chloride 103 96 - 106 mmol/L   CO2 25 20 - 29 mmol/L   Calcium 8.7 8.7 - 10.2 mg/dL   Total Protein 6.9 6.0 - 8.5 g/dL   Albumin 4.3 3.8 - 4.9 g/dL   Globulin, Total 2.6 1.5 - 4.5 g/dL   Albumin/Globulin Ratio 1.7 1.2 - 2.2   Bilirubin Total 0.4 0.0 - 1.2 mg/dL   Alkaline Phosphatase 122 (H) 44 - 121 IU/L   AST 24 0 - 40 IU/L   ALT 17 0 - 32 IU/L  Lipid Panel w/o Chol/HDL Ratio  Result Value Ref Range   Cholesterol, Total 188 100 - 199 mg/dL   Triglycerides 80 0 - 149 mg/dL   HDL 52 >14  mg/dL   VLDL Cholesterol Cal 15 5 - 40 mg/dL   LDL Chol Calc (NIH) 206 (H) 0 - 99 mg/dL  Urinalysis, Routine w reflex microscopic  Result Value Ref Range   Specific Gravity, UA 1.025 1.005 - 1.030   pH, UA 5.0 5.0 - 7.5   Color, UA Yellow Yellow   Appearance Ur Clear Clear   Leukocytes,UA 1+ (A) Negative   Protein,UA Negative Negative/Trace   Glucose, UA Negative Negative   Ketones, UA Negative Negative   RBC, UA Negative Negative   Bilirubin, UA Negative Negative   Urobilinogen, Ur 1.0 0.2 - 1.0 mg/dL   Nitrite, UA Negative Negative   Microscopic Examination See below:   TSH  Result Value Ref Range   TSH 2.290 0.450 - 4.500 uIU/mL  Microalbumin, Urine Waived  Result Value Ref Range   Microalb, Ur Waived 30 (H) 0 - 19 mg/L   Creatinine, Urine Waived 100 10 - 300 mg/dL   Microalb/Creat Ratio <30 <30 mg/g  Bayer DCA Hb A1c Waived  Result Value Ref Range   HB A1C (BAYER DCA - WAIVED) 5.1 4.8 - 5.6 %  VITAMIN D 25 Hydroxy (Vit-D Deficiency, Fractures)  Result Value Ref Range   Vit D, 25-Hydroxy 36.9 30.0 - 100.0 ng/mL      Assessment & Plan:   Problem List Items Addressed This Visit       Cardiovascular and Mediastinum   Angina pectoris (HCC)    Stable. Continue to monitor. Call with any concerns. Continue to follow with cardiology.       Relevant Medications   amLODipine (NORVASC) 10 MG tablet   apixaban (ELIQUIS) 5 MG TABS tablet   atorvastatin (LIPITOR) 40 MG tablet   furosemide (LASIX) 40 MG tablet   Pulmonary hypertensive arterial disease (HCC)    Stable. Continue to follow. Continue to follow with cardiology. Call with any concerns.       Relevant Medications   amLODipine (NORVASC) 10 MG tablet   apixaban (ELIQUIS) 5 MG TABS tablet   atorvastatin (LIPITOR) 40 MG tablet   furosemide (LASIX) 40 MG tablet     Endocrine  Autoimmune hypothyroidism    Rechecking labs today. Await results. Treat as needed.       Relevant Orders   CBC with  Differential/Platelet (Completed)   Comprehensive metabolic panel (Completed)   TSH (Completed)   Microalbumin, Urine Waived (Completed)     Nervous and Auditory   Neuromuscular disorder (HCC)    Stable. Continue to monitor. Call with any concerns. Continue to follow with rheumatology.      Relevant Medications   baclofen (LIORESAL) 10 MG tablet   buPROPion (WELLBUTRIN XL) 150 MG 24 hr tablet   busPIRone (BUSPAR) 5 MG tablet   gabapentin (NEURONTIN) 300 MG capsule   hydrOXYzine (ATARAX/VISTARIL) 25 MG tablet   venlafaxine XR (EFFEXOR-XR) 150 MG 24 hr capsule   venlafaxine XR (EFFEXOR-XR) 75 MG 24 hr capsule     Genitourinary   Benign hypertensive renal disease    Under good control on current regimen. Continue current regimen. Continue to monitor. Call with any concerns. Refills given. Labs drawn today.       Relevant Orders   CBC with Differential/Platelet (Completed)   Comprehensive metabolic panel (Completed)   Urinalysis, Routine w reflex microscopic (Completed)     Hematopoietic and Hemostatic   Acquired thrombophilia (HCC)    Continue eliquis. Continue to monitor. Labs drawn today. Refills given.       Relevant Orders   CBC with Differential/Platelet (Completed)   Comprehensive metabolic panel (Completed)     Other   Depression    Under good control on current regimen. Continue current regimen. Continue to monitor. Call with any concerns. Refills given.        Relevant Medications   buPROPion (WELLBUTRIN XL) 150 MG 24 hr tablet   busPIRone (BUSPAR) 5 MG tablet   hydrOXYzine (ATARAX/VISTARIL) 25 MG tablet   venlafaxine XR (EFFEXOR-XR) 150 MG 24 hr capsule   venlafaxine XR (EFFEXOR-XR) 75 MG 24 hr capsule   Other Relevant Orders   CBC with Differential/Platelet (Completed)   Comprehensive metabolic panel (Completed)   Anxiety    Under good control on current regimen. Continue current regimen. Continue to monitor. Call with any concerns. Refills given.         Relevant Medications   buPROPion (WELLBUTRIN XL) 150 MG 24 hr tablet   busPIRone (BUSPAR) 5 MG tablet   hydrOXYzine (ATARAX/VISTARIL) 25 MG tablet   venlafaxine XR (EFFEXOR-XR) 150 MG 24 hr capsule   venlafaxine XR (EFFEXOR-XR) 75 MG 24 hr capsule   Other Relevant Orders   CBC with Differential/Platelet (Completed)   Comprehensive metabolic panel (Completed)   Vitamin D deficiency    Rechecking labs today. Await results. Treat as needed.       Relevant Orders   CBC with Differential/Platelet (Completed)   Comprehensive metabolic panel (Completed)   VITAMIN D 25 Hydroxy (Vit-D Deficiency, Fractures) (Completed)   Lupus (systemic lupus erythematosus) (HCC)    Stable. Continue to follow with rheumatology. Call with any concerns.       Morbid obesity (HCC)    Encouraged diet and exercise with goal of losing 1-2lbs per week. Continue to monitor.       Relevant Orders   Bayer DCA Hb A1c Waived (Completed)   Mixed hyperlipidemia    Under good control on current regimen. Continue current regimen. Continue to monitor. Call with any concerns. Refills given. Labs drawn today.        Relevant Medications   amLODipine (NORVASC) 10 MG tablet   apixaban (ELIQUIS) 5 MG TABS tablet  atorvastatin (LIPITOR) 40 MG tablet   furosemide (LASIX) 40 MG tablet   Other Relevant Orders   CBC with Differential/Platelet (Completed)   Comprehensive metabolic panel (Completed)   Lipid Panel w/o Chol/HDL Ratio (Completed)   Other Visit Diagnoses     Routine general medical examination at a health care facility    -  Primary   Vaccines up to date. Screening labs checked today. Pap up to date. Mammogram and colonoscopy up to date. Continue diet and exercise. Call with concerns.    Word finding difficulty       Would like to see neurology. Referral placed today.   Relevant Orders   Ambulatory referral to Neurology   CBC with Differential/Platelet (Completed)   Comprehensive metabolic panel  (Completed)   Caregiver stress       Referal to CCM for help with her grandson placed today.   Relevant Orders   AMB Referral to [facility] Coordinaton        Follow up plan: Return in about 6 months (around 07/12/2021).   LABORATORY TESTING:  - Pap smear: up to date  IMMUNIZATIONS:   - Tdap: Tetanus vaccination status reviewed: last tetanus booster within 10 years. - Influenza: Up to date - Pneumovax: Up to date - Prevnar: Not applicable - COVID: Up to date - Shingrix vaccine: Given elsewhere  SCREENING: -Mammogram: Ordered today  - Colonoscopy: Up to date   PATIENT COUNSELING:   Advised to take 1 mg of folate supplement per day if capable of pregnancy.   Sexuality: Discussed sexually transmitted diseases, partner selection, use of condoms, avoidance of unintended pregnancy  and contraceptive alternatives.   Advised to avoid cigarette smoking.  I discussed with the patient that most people either abstain from alcohol or drink within safe limits (<=14/week and <=4 drinks/occasion for males, <=7/weeks and <= 3 drinks/occasion for females) and that the risk for alcohol disorders and other health effects rises proportionally with the number of drinks per week and how often a drinker exceeds daily limits.  Discussed cessation/primary prevention of drug use and availability of treatment for abuse.   Diet: Encouraged to adjust caloric intake to maintain  or achieve ideal body weight, to reduce intake of dietary saturated fat and total fat, to limit sodium intake by avoiding high sodium foods and not adding table salt, and to maintain adequate dietary potassium and calcium preferably from fresh fruits, vegetables, and low-fat dairy products.    stressed the importance of regular exercise  Injury prevention: Discussed safety belts, safety helmets, smoke detector, smoking near bedding or upholstery.   Dental health: Discussed importance of regular tooth brushing, flossing, and  dental visits.    NEXT PREVENTATIVE PHYSICAL DUE IN 1 YEAR. Return in about 6 months (around 07/12/2021).

## 2021-01-12 NOTE — Patient Instructions (Signed)
Call to schedule your mammogram: [facility]  Address: [address], [place], [place] [address]  Phone: [phone]  

## 2021-01-12 NOTE — Assessment & Plan Note (Signed)
Rechecking labs today. Await results. Treat as needed.  °

## 2021-01-12 NOTE — Assessment & Plan Note (Signed)
Stable. Continue to follow with rheumatology. Call with any concerns.  

## 2021-01-12 NOTE — Assessment & Plan Note (Signed)
Stable. Continue to follow. Continue to follow with cardiology. Call with any concerns.

## 2021-01-12 NOTE — Assessment & Plan Note (Signed)
Encouraged diet and exercise with goal of losing 1-2 lbs per week. Continue to monitor.  

## 2021-01-12 NOTE — Assessment & Plan Note (Signed)
Stable. Continue to monitor. Call with any concerns. Continue to follow with rheumatology.

## 2021-01-12 NOTE — Assessment & Plan Note (Signed)
Under good control on current regimen. Continue current regimen. Continue to monitor. Call with any concerns. Refills given. Labs drawn today.   

## 2021-01-12 NOTE — Assessment & Plan Note (Signed)
Stable. Continue to monitor. Call with any concerns. Continue to follow with cardiology. 

## 2021-01-13 LAB — CBC WITH DIFFERENTIAL/PLATELET
Basophils Absolute: 0 10*3/uL (ref 0.0–0.2)
Basos: 0 %
EOS (ABSOLUTE): 0 10*3/uL (ref 0.0–0.4)
Eos: 0 %
Hematocrit: 39.6 % (ref 34.0–46.6)
Hemoglobin: 13.1 g/dL (ref 11.1–15.9)
Immature Grans (Abs): 0 10*3/uL (ref 0.0–0.1)
Immature Granulocytes: 0 %
Lymphocytes Absolute: 1.7 10*3/uL (ref 0.7–3.1)
Lymphs: 36 %
MCH: 31.6 pg (ref 26.6–33.0)
MCHC: 33.1 g/dL (ref 31.5–35.7)
MCV: 96 fL (ref 79–97)
Monocytes Absolute: 0.5 10*3/uL (ref 0.1–0.9)
Monocytes: 10 %
Neutrophils Absolute: 2.6 10*3/uL (ref 1.4–7.0)
Neutrophils: 54 %
Platelets: 243 10*3/uL (ref 150–450)
RBC: 4.14 x10E6/uL (ref 3.77–5.28)
RDW: 11.5 % — ABNORMAL LOW (ref 11.7–15.4)
WBC: 4.7 10*3/uL (ref 3.4–10.8)

## 2021-01-13 LAB — COMPREHENSIVE METABOLIC PANEL
ALT: 17 IU/L (ref 0–32)
AST: 24 IU/L (ref 0–40)
Albumin/Globulin Ratio: 1.7 (ref 1.2–2.2)
Albumin: 4.3 g/dL (ref 3.8–4.9)
Alkaline Phosphatase: 122 IU/L — ABNORMAL HIGH (ref 44–121)
BUN/Creatinine Ratio: 14 (ref 9–23)
BUN: 12 mg/dL (ref 6–24)
Bilirubin Total: 0.4 mg/dL (ref 0.0–1.2)
CO2: 25 mmol/L (ref 20–29)
Calcium: 8.7 mg/dL (ref 8.7–10.2)
Chloride: 103 mmol/L (ref 96–106)
Creatinine, Ser: 0.83 mg/dL (ref 0.57–1.00)
Globulin, Total: 2.6 g/dL (ref 1.5–4.5)
Glucose: 93 mg/dL (ref 70–99)
Potassium: 4 mmol/L (ref 3.5–5.2)
Sodium: 141 mmol/L (ref 134–144)
Total Protein: 6.9 g/dL (ref 6.0–8.5)
eGFR: 81 mL/min/{1.73_m2} (ref >59)

## 2021-01-13 LAB — LIPID PANEL W/O CHOL/HDL RATIO
Cholesterol, Total: 188 mg/dL (ref 100–199)
HDL: 52 mg/dL (ref >39)
LDL Chol Calc (NIH): 121 mg/dL — ABNORMAL HIGH (ref 0–99)
Triglycerides: 80 mg/dL (ref 0–149)
VLDL Cholesterol Cal: 15 mg/dL (ref 5–40)

## 2021-01-13 LAB — VITAMIN D 25 HYDROXY (VIT D DEFICIENCY, FRACTURES): Vit D, 25-Hydroxy: 36.9 ng/mL (ref 30.0–100.0)

## 2021-01-13 LAB — TSH: TSH: 2.29 u[IU]/mL (ref 0.450–4.500)

## 2021-01-14 ENCOUNTER — Other Ambulatory Visit: Payer: Self-pay | Admitting: Family Medicine

## 2021-01-14 ENCOUNTER — Telehealth: Payer: Self-pay

## 2021-01-14 MED ORDER — LEVOTHYROXINE SODIUM 75 MCG PO TABS: 75.0000 ug | ORAL_TABLET | Freq: Every day | ORAL | 3 refills | Status: DC

## 2021-01-14 NOTE — Chronic Care Management (AMB) (Signed)
  Chronic Care Management   Note  01/14/2021 Name: Voncille Simm MRN: [id number] DOB: March 06, 1961  Lakenzie Mcclafferty is a 60 y.o. year old female who is a primary care patient of Dorcas Carrow, DO. Tajanae Guilbault is currently enrolled in care management services. An additional referral for LCSW was placed.   Follow up plan: Unsuccessful telephone outreach attempt made. A HIPAA compliant phone message was left for the patient providing contact information and requesting a return call.  The care management team will reach out to the patient again over the next 3 days.  If patient returns call to provider office, please advise to call Embedded Care Management Care Guide Penne Lash  at [phone]  Penne Lash, RMA Care Guide, Embedded Care Coordination [facility]  [place], [place] [address] Direct Dial: [phone] [email or web address]: [email or web address]

## 2021-01-14 NOTE — Assessment & Plan Note (Signed)
Under good control on current regimen. Continue current regimen. Continue to monitor. Call with any concerns. Refills given.   

## 2021-01-19 NOTE — Chronic Care Management (AMB) (Signed)
  Chronic Care Management   Note  01/19/2021 Name: Gwynevere Lizana MRN: [id number] DOB: [birth year]  Marium Ragan is a 61 y.o. year old female who is a primary care patient of Dorcas Carrow, DO. Britiany Silbernagel is currently enrolled in care management services. An additional referral for LCSW  was placed.   Follow up plan: Telephone appointment with care management team member scheduled for:02/02/2021  Penne Lash, RMA Care Guide, Embedded Care Coordination [facility]  [place], [place] [address] Direct Dial: [phone] [email or web address]: [email or web address]

## 2021-02-02 ENCOUNTER — Ambulatory Visit (INDEPENDENT_AMBULATORY_CARE_PROVIDER_SITE_OTHER): Payer: Medicare HMO | Admitting: Licensed Clinical Social Worker

## 2021-02-02 ENCOUNTER — Other Ambulatory Visit: Payer: Self-pay | Admitting: Family Medicine

## 2021-02-02 DIAGNOSIS — M329 Systemic lupus erythematosus, unspecified: Secondary | ICD-10-CM

## 2021-02-02 DIAGNOSIS — Z1231 Encounter for screening mammogram for malignant neoplasm of breast: Secondary | ICD-10-CM

## 2021-02-02 DIAGNOSIS — E063 Autoimmune thyroiditis: Secondary | ICD-10-CM

## 2021-02-02 DIAGNOSIS — M810 Age-related osteoporosis without current pathological fracture: Secondary | ICD-10-CM

## 2021-02-02 DIAGNOSIS — M797 Fibromyalgia: Secondary | ICD-10-CM

## 2021-02-02 DIAGNOSIS — F419 Anxiety disorder, unspecified: Secondary | ICD-10-CM

## 2021-02-02 DIAGNOSIS — F331 Major depressive disorder, recurrent, moderate: Secondary | ICD-10-CM

## 2021-02-03 NOTE — Chronic Care Management (AMB) (Signed)
Chronic Care Management    Clinical Social Work Note  02/03/2021 Name: Kaylee Fox MRN: [id number] DOB: Jun 14, 1960  Kaylee Fox is a 60 y.o. year old female who is a primary care patient of Valerie Roys, DO. The CCM team was consulted to assist the patient with chronic disease management and/or care coordination needs related to: Mental Health Counseling and Resources.   Engaged with patient by telephone for initial visit in response to provider referral for social work chronic care management and care coordination services.   Consent to Services:  The patient was given the following information about Chronic Care Management services today, agreed to services, and gave verbal consent: 1. CCM service includes personalized support from designated clinical staff supervised by the primary care provider, including individualized plan of care and coordination with other care providers 2. 24/7 contact phone numbers for assistance for urgent and routine care needs. 3. Service will only be billed when office clinical staff spend 20 minutes or more in a month to coordinate care. 4. Only one practitioner may furnish and bill the service in a calendar month. 5.The patient may stop CCM services at any time (effective at the end of the month) by phone call to the office staff. 6. The patient will be responsible for cost sharing (co-pay) of up to 20% of the service fee (after annual deductible is met). Patient agreed to services and consent obtained.  Patient agreed to services and consent obtained.   Consent to Services:  The patient was given information about Care Management services, agreed to services, and gave verbal consent prior to initiation of services.  Please see initial visit note for detailed documentation.   Patient agreed to services today and consent obtained.  Engaged with patient by phone in response to provider referral for social work care coordination services:   Assessment/Interventions: Assessed patient's previous and current treatment, coping skills, support system and barriers to care. Patient is experiencing increase in symptoms triggered by stress. CCM LCSW discussed options to assist with obtaining temporary custody of minor grandchild. CCM LCSW will obtain additional supportive resources. Self-care strategies discussed  See Care Plan below for interventions and patient self-care activities.  Recent life changes or stressors: Caregiver stress  Recommendation: Patient may benefit from, and is in agreement work with LCSW to address care coordination needs and will continue to work with the clinical team to address health care and disease management related needs.  Follow up Plan: Patient would like continued follow-up from CCM LCSW.  per patient's request will follow up in 4 weeks.  Will call office if needed prior to next encounter.    SDOH (Social Determinants of Health) assessments and interventions performed:    Advanced Directives Status: Not addressed in this encounter.  CCM Care Plan  Allergies  Allergen Reactions   Penicillins Rash   Clindamycin Nausea Only   Ciprofloxacin Other (See Comments)   Morphine Other (See Comments)    Outpatient Encounter Medications as of 02/02/2021  Medication Sig   acetaminophen (TYLENOL) 325 MG tablet Take by mouth as needed.    albuterol (VENTOLIN HFA) 108 (90 Base) MCG/ACT inhaler Inhale 2 puffs into the lungs every 6 (six) hours as needed for wheezing or shortness of breath.   amLODipine (NORVASC) 10 MG tablet Take 1 tablet (10 mg total) by mouth daily.   apixaban (ELIQUIS) 5 MG TABS tablet Take 1 tablet (5 mg total) by mouth 2 (two) times daily.   atorvastatin (LIPITOR) 40 MG tablet Take  1 tablet (40 mg total) by mouth daily.   azaTHIOprine (IMURAN) 50 MG tablet Take 100 mg by mouth daily.   baclofen (LIORESAL) 10 MG tablet Take 1 tablet (10 mg total) by mouth 3 (three) times daily.    buPROPion (WELLBUTRIN XL) 150 MG 24 hr tablet Take 1 tablet (150 mg total) by mouth daily.   busPIRone (BUSPAR) 5 MG tablet Take 1 tablet (5 mg total) by mouth 2 (two) times daily.   calcium carbonate (OS-CAL) 600 MG TABS tablet Take by mouth.   cetirizine (ZYRTEC) 10 MG tablet Take 1 tablet (10 mg total) by mouth daily.   Cholecalciferol 25 MCG (1000 UT) capsule Take 1,000 Units by mouth daily. Takes 2 1000 mg per day   fluticasone (FLONASE) 50 MCG/ACT nasal spray USE 2 SPRAYS INTO BOTH NOSTRILS DAILY.   furosemide (LASIX) 40 MG tablet Take 2 tablets (80 mg total) by mouth 2 (two) times daily.   gabapentin (NEURONTIN) 300 MG capsule Take 2 capsules (600 mg total) by mouth 2 (two) times daily.   hydroxychloroquine (PLAQUENIL) 200 MG tablet Take 400 mg by mouth 2 (two) times daily.    hydrOXYzine (ATARAX/VISTARIL) 25 MG tablet Take 1 tablet (25 mg total) by mouth at bedtime as needed.   levothyroxine (SYNTHROID) 75 MCG tablet Take 1 tablet (75 mcg total) by mouth daily before breakfast.   montelukast (SINGULAIR) 10 MG tablet Take 1 tablet (10 mg total) by mouth at bedtime.   Multiple Vitamin (MULTI-VITAMINS) TABS Take by mouth.   mupirocin cream (BACTROBAN) 2 % Apply to affected area on arms/legs twice a day as needed.   nystatin (MYCOSTATIN) 100000 UNIT/ML suspension Take 5 mLs (500,000 Units total) by mouth 4 (four) times daily.   pantoprazole (PROTONIX) 40 MG tablet Take 1 tablet (40 mg total) by mouth daily.   potassium chloride SA (KLOR-CON) 20 MEQ tablet Take 1 tablet (20 mEq total) by mouth daily.   triamcinolone cream (KENALOG) 0.1 % Apply 1 application topically 2 (two) times daily.   venlafaxine XR (EFFEXOR-XR) 150 MG 24 hr capsule TAKE 1 CAPSULE DAILY WITH BREAKFAST with the $Remove'75mg'wXBiBWp$  for $Re'225mg'bph$  daily   venlafaxine XR (EFFEXOR-XR) 75 MG 24 hr capsule Take 1 capsule (75 mg total) by mouth daily with breakfast. To be taken with $RemoveBe'150mg'PWZdknuGF$  for $Re'225mg'tGk$  total   VOLTAREN 1 % GEL    No  facility-administered encounter medications on file as of 02/02/2021.    Patient Active Problem List   Diagnosis Date Noted   Acquired thrombophilia ([place]) 01/12/2021   Pelvic pain 09/30/2017   Dyspareunia, female 09/15/2017   Vaginal atrophy 08/24/2017   Menopausal symptoms 08/24/2017   Varicose veins of both lower extremities with pain 11/09/2016   Mixed hyperlipidemia 08/20/2016   Advanced directives, counseling/discussion 08/20/2016   Depression    Anxiety    Benign hypertensive renal disease    Osteoporosis    Vitamin D deficiency    Fibromyalgia    History of pulmonary embolus (PE)    Lupus (systemic lupus erythematosus) (HCC)    Chronic hypokalemia    Autoimmune hypothyroidism    Neuromuscular disorder (HCC)    OSA on CPAP    Diverticulosis    Chronic pain    Herpes labialis 02/04/2016   Angina pectoris ([place]) 08/06/2015   Continuous opioid dependence ([place]) 08/06/2015   Immunosuppression ([place]) 08/06/2015   Long term current use of anticoagulant therapy 08/06/2015   Morbid obesity ([place]) 08/06/2015   On corticosteroid therapy 08/06/2015   History  of pneumonia 08/06/2015   Pulmonary hypertensive arterial disease ([place]) 08/06/2015    Conditions to be addressed/monitored: Anxiety and Depression; Family and relationship dysfunction  Care Plan : LCSW Plan of Care  Updates made by Rebekah Chesterfield, LCSW since 02/03/2021 12:00 AM     Problem: Coping Skills (General Plan of Care)      Long-Range Goal: Coping Skills Enhanced   Start Date: 02/02/2021  This Visit's Progress: On track  Priority: High  Note:   Current barriers:   Chronic Mental Health needs related to Anxiety and Depression Mental Health Concerns  and Family and relationship dysfunction Needs Support, Education, and Care Coordination in order to meet unmet mental health needs. Clinical Goal(s): demonstrate a reduction in symptoms related to :Depression: anxiety   Clinical Interventions:  Assessed  patient's previous and current treatment, coping skills, support system and barriers to care  Patient reports increase in symptoms triggered by stress. Pt is currently providing care to minor grandson. She has not heard from adult daughter in three months and does not have paperwork to initiate medical care/follow-up Patient reports improved management of symptoms with participation with medication management  Patient identified strong support system, including boyfriend. She receives SNAP benefits and has stable transportation CCM LCSW discussed self-care strategies to assist in promoting relaxation and a positive mood Mindfulness or Relaxation training provided Active listening / Reflection utilized  Emotional Support Provided Provided psychoeducation for mental health needs  Quality of sleep assessed & Sleep Hygiene techniques promoted  Caregiver stress acknowledged  Verbalization of feelings encouraged  ; Review resources, discussed options and provided patient information about  Options on how to obtain temporary guardianship 1:1 collaboration with primary care provider regarding development and update of comprehensive plan of care as evidenced by provider attestation and co-signature Inter-disciplinary care team collaboration (see longitudinal plan of care) Patient Goals/Self-Care Activities: Over the next 120 days Attend scheduled appointments with providers Contact PCP office with any questions or concerns Utilize healthy coping skills discussed Continue with your self-care action plan        Christa See, MSW, [email or web address] Phone [phone] 9:25 AM

## 2021-02-03 NOTE — Patient Instructions (Signed)
Visit Information  Thank you for taking time to visit with me today. Please don't hesitate to contact me if I can be of assistance to you before our next scheduled telephone appointment.  Following are the goals we discussed today:  Patient Goals/Self-Care Activities: Over the next 120 days Attend scheduled appointments with providers Contact PCP office with any questions or concerns Utilize healthy coping skills discussed Continue with your self-care action plan   Our next appointment is by telephone on 02/23/21 at 4:15 PM  Please call the care guide team at [phone] if you need to cancel or reschedule your appointment.   If you are experiencing a Mental Health or Behavioral Health Crisis or need someone to talk to, please call the Suicide and Crisis Lifeline: 988 call 911   Following is a copy of your full plan of care:  Care Plan : LCSW Plan of Care  Updates made by Bridgett Larsson, LCSW since 02/03/2021 12:00 AM     Problem: Coping Skills (General Plan of Care)      Long-Range Goal: Coping Skills Enhanced   Start Date: 02/02/2021  This Visit's Progress: On track  Priority: High  Note:   Current barriers:   Chronic Mental Health needs related to Anxiety and Depression Mental Health Concerns  and Family and relationship dysfunction Needs Support, Education, and Care Coordination in order to meet unmet mental health needs. Clinical Goal(s): demonstrate a reduction in symptoms related to :Depression: anxiety   Clinical Interventions:  Assessed patient's previous and current treatment, coping skills, support system and barriers to care  Patient reports increase in symptoms triggered by stress. Pt is currently providing care to minor grandson. She has not heard from adult daughter in three months and does not have paperwork to initiate medical care/follow-up Patient reports improved management of symptoms with participation with medication management  Patient identified  strong support system, including boyfriend. She receives SNAP benefits and has stable transportation CCM LCSW discussed self-care strategies to assist in promoting relaxation and a positive mood Mindfulness or Relaxation training provided Active listening / Reflection utilized  Emotional Support Provided Provided psychoeducation for mental health needs  Quality of sleep assessed & Sleep Hygiene techniques promoted  Caregiver stress acknowledged  Verbalization of feelings encouraged  ; Review resources, discussed options and provided patient information about  Options on how to obtain temporary guardianship 1:1 collaboration with primary care provider regarding development and update of comprehensive plan of care as evidenced by provider attestation and co-signature Inter-disciplinary care team collaboration (see longitudinal plan of care) Patient Goals/Self-Care Activities: Over the next 120 days Attend scheduled appointments with providers Contact PCP office with any questions or concerns Utilize healthy coping skills discussed Continue with your self-care action plan        Ms. Ratcliffe was given information about Care Management services by the embedded care coordination team including:  Care Management services include personalized support from designated clinical staff supervised by her physician, including individualized plan of care and coordination with other care providers 24/7 contact phone numbers for assistance for urgent and routine care needs. The patient may stop CCM services at any time (effective at the end of the month) by phone call to the office staff.  Patient agreed to services and verbal consent obtained.   Patient verbalizes understanding of instructions provided today and agrees to view in MyChart.   Jenel Lucks, MSW, LCSW [facility] Care Management [place]  [email or web address] Phone [phone]  9:29 AM

## 2021-02-04 DIAGNOSIS — M810 Age-related osteoporosis without current pathological fracture: Secondary | ICD-10-CM

## 2021-02-04 DIAGNOSIS — E063 Autoimmune thyroiditis: Secondary | ICD-10-CM

## 2021-02-04 DIAGNOSIS — F331 Major depressive disorder, recurrent, moderate: Secondary | ICD-10-CM

## 2021-02-10 ENCOUNTER — Ambulatory Visit (INDEPENDENT_AMBULATORY_CARE_PROVIDER_SITE_OTHER): Payer: Medicare HMO | Admitting: Family Medicine

## 2021-02-10 ENCOUNTER — Encounter: Payer: Self-pay | Admitting: Family Medicine

## 2021-02-10 ENCOUNTER — Other Ambulatory Visit: Payer: Self-pay

## 2021-02-10 VITALS — BP 117/76 | HR 78 | Temp 98.1°F | Wt 273.8 lb

## 2021-02-10 DIAGNOSIS — M722 Plantar fascial fibromatosis: Secondary | ICD-10-CM

## 2021-02-10 MED ORDER — DICLOFENAC SODIUM 1 % EX GEL: 4.0000 g | Freq: Four times a day (QID) | CUTANEOUS | 3 refills | Status: DC

## 2021-02-10 NOTE — Progress Notes (Signed)
BP 117/76   Pulse 78   Temp 98.1 F (36.7 C)   Wt 273 lb 12.8 oz (124.2 kg)   SpO2 99%   BMI 48.50 kg/m    Subjective:    Patient ID: Kaylee Fox, female    DOB: October 24, 1960, 60 y.o.   MRN: [id number]  HPI: Kaylee Fox is a 60 y.o. female  Chief Complaint  Patient presents with   Foot Pain    Patient states she began to have foot pain about 2 weeks ago, feels like a throbbing and burning pain   FOOT PAIN Duration:  2 weeks Involved foot: right Mechanism of injury: unknown Location: bottom of your foot Onset: sudden  Severity: moderate  Quality:  sharp Frequency: constant when she's on her foot Radiation: no Aggravating factors: weight bearing and walking  Alleviating factors: nothing  Status: worse Treatments attempted: bengay, rest  Relief with NSAIDs?:  No NSAIDs Taken Weakness with weight bearing or walking: no Morning stiffness: yes Swelling: no Redness: no Bruising: no Paresthesias / decreased sensation: no  Fevers:no  Relevant past medical, surgical, family and social history reviewed and updated as indicated. Interim medical history since our last visit reviewed. Allergies and medications reviewed and updated.  Review of Systems  Constitutional: Negative.   Respiratory: Negative.    Cardiovascular: Negative.   Gastrointestinal: Negative.   Musculoskeletal:  Positive for myalgias. Negative for arthralgias, back pain, gait problem, joint swelling, neck pain and neck stiffness.  Neurological: Negative.   Psychiatric/Behavioral: Negative.     Per HPI unless specifically indicated above     Objective:    BP 117/76   Pulse 78   Temp 98.1 F (36.7 C)   Wt 273 lb 12.8 oz (124.2 kg)   SpO2 99%   BMI 48.50 kg/m   Wt Readings from Last 3 Encounters:  02/10/21 273 lb 12.8 oz (124.2 kg)  01/12/21 273 lb (123.8 kg)  12/03/20 273 lb (123.8 kg)    Physical Exam Vitals and nursing note reviewed.  Constitutional:      General: She is not in acute  distress.    Appearance: Normal appearance. She is obese. She is not ill-appearing, toxic-appearing or diaphoretic.  HENT:     Head: Normocephalic and atraumatic.     Right Ear: External ear normal.     Left Ear: External ear normal.     Nose: Nose normal.     Mouth/Throat:     Mouth: Mucous membranes are moist.     Pharynx: Oropharynx is clear.  Eyes:     General: No scleral icterus.       Right eye: No discharge.        Left eye: No discharge.     Extraocular Movements: Extraocular movements intact.     Conjunctiva/sclera: Conjunctivae normal.     Pupils: Pupils are equal, round, and reactive to light.  Cardiovascular:     Rate and Rhythm: Normal rate and regular rhythm.     Pulses: Normal pulses.     Heart sounds: Normal heart sounds. No murmur heard.   No friction rub. No gallop.  Pulmonary:     Effort: Pulmonary effort is normal. No respiratory distress.     Breath sounds: Normal breath sounds. No stridor. No wheezing, rhonchi or rales.  Chest:     Chest wall: No tenderness.  Musculoskeletal:        General: Tenderness (plantar fascia on R foot) present. Normal range of motion.     Cervical  back: Normal range of motion and neck supple.  Skin:    General: Skin is warm and dry.     Capillary Refill: Capillary refill takes less than 2 seconds.     Coloration: Skin is not jaundiced or pale.     Findings: No bruising, erythema, lesion or rash.  Neurological:     General: No focal deficit present.     Mental Status: She is alert and oriented to person, place, and time. Mental status is at baseline.  Psychiatric:        Mood and Affect: Mood normal.        Behavior: Behavior normal.        Thought Content: Thought content normal.        Judgment: Judgment normal.    Results for orders placed or performed in visit on 01/12/21  Microscopic Examination   Urine  Result Value Ref Range   WBC, UA 0-5 0 - 5 /hpf   RBC None seen 0 - 2 /hpf   Epithelial Cells (non renal) None  seen 0 - 10 /hpf   Bacteria, UA None seen None seen/Few  CBC with Differential/Platelet  Result Value Ref Range   WBC 4.7 3.4 - 10.8 x10E3/uL   RBC 4.14 3.77 - 5.28 x10E6/uL   Hemoglobin 13.1 11.1 - 15.9 g/dL   Hematocrit 73.3 34.9 - 46.6 %   MCV 96 79 - 97 fL   MCH 31.6 26.6 - 33.0 pg   MCHC 33.1 31.5 - 35.7 g/dL   RDW 12.5 (L) 18.9 - 02.0 %   Platelets 243 150 - 450 x10E3/uL   Neutrophils 54 Not Estab. %   Lymphs 36 Not Estab. %   Monocytes 10 Not Estab. %   Eos 0 Not Estab. %   Basos 0 Not Estab. %   Neutrophils Absolute 2.6 1.4 - 7.0 x10E3/uL   Lymphocytes Absolute 1.7 0.7 - 3.1 x10E3/uL   Monocytes Absolute 0.5 0.1 - 0.9 x10E3/uL   EOS (ABSOLUTE) 0.0 0.0 - 0.4 x10E3/uL   Basophils Absolute 0.0 0.0 - 0.2 x10E3/uL   Immature Granulocytes 0 Not Estab. %   Immature Grans (Abs) 0.0 0.0 - 0.1 x10E3/uL  Comprehensive metabolic panel  Result Value Ref Range   Glucose 93 70 - 99 mg/dL   BUN 12 6 - 24 mg/dL   Creatinine, Ser 9.21 0.57 - 1.00 mg/dL   eGFR 81 >34 ED/RKR/2.56   BUN/Creatinine Ratio 14 9 - 23   Sodium 141 134 - 144 mmol/L   Potassium 4.0 3.5 - 5.2 mmol/L   Chloride 103 96 - 106 mmol/L   CO2 25 20 - 29 mmol/L   Calcium 8.7 8.7 - 10.2 mg/dL   Total Protein 6.9 6.0 - 8.5 g/dL   Albumin 4.3 3.8 - 4.9 g/dL   Globulin, Total 2.6 1.5 - 4.5 g/dL   Albumin/Globulin Ratio 1.7 1.2 - 2.2   Bilirubin Total 0.4 0.0 - 1.2 mg/dL   Alkaline Phosphatase 122 (H) 44 - 121 IU/L   AST 24 0 - 40 IU/L   ALT 17 0 - 32 IU/L  Lipid Panel w/o Chol/HDL Ratio  Result Value Ref Range   Cholesterol, Total 188 100 - 199 mg/dL   Triglycerides 80 0 - 149 mg/dL   HDL 52 >01 mg/dL   VLDL Cholesterol Cal 15 5 - 40 mg/dL   LDL Chol Calc (NIH) 294 (H) 0 - 99 mg/dL  Urinalysis, Routine w reflex microscopic  Result Value Ref Range  Specific Gravity, UA 1.025 1.005 - 1.030   pH, UA 5.0 5.0 - 7.5   Color, UA Yellow Yellow   Appearance Ur Clear Clear   Leukocytes,UA 1+ (A) Negative    Protein,UA Negative Negative/Trace   Glucose, UA Negative Negative   Ketones, UA Negative Negative   RBC, UA Negative Negative   Bilirubin, UA Negative Negative   Urobilinogen, Ur 1.0 0.2 - 1.0 mg/dL   Nitrite, UA Negative Negative   Microscopic Examination See below:   TSH  Result Value Ref Range   TSH 2.290 0.450 - 4.500 uIU/mL  Microalbumin, Urine Waived  Result Value Ref Range   Microalb, Ur Waived 30 (H) 0 - 19 mg/L   Creatinine, Urine Waived 100 10 - 300 mg/dL   Microalb/Creat Ratio <30 <30 mg/g  Bayer DCA Hb A1c Waived  Result Value Ref Range   HB A1C (BAYER DCA - WAIVED) 5.1 4.8 - 5.6 %  VITAMIN D 25 Hydroxy (Vit-D Deficiency, Fractures)  Result Value Ref Range   Vit D, 25-Hydroxy 36.9 30.0 - 100.0 ng/mL      Assessment & Plan:   Problem List Items Addressed This Visit   None Visit Diagnoses     Plantar fasciitis of right foot    -  Primary   Will treat with voltaren, stretches and brace. If not getting better in a couple of weeks, we will refer to podiatry.        Follow up plan: Return if symptoms worsen or fail to improve.

## 2021-02-23 ENCOUNTER — Telehealth: Payer: Medicare HMO

## 2021-02-24 ENCOUNTER — Telehealth: Payer: Self-pay | Admitting: Licensed Clinical Social Worker

## 2021-02-24 NOTE — Telephone Encounter (Signed)
° °   Clinical Social Work  Care Management   Phone Outreach    02/24/2021 Name: Kaylee Fox MRN: [id number] DOB: 1961-02-18  Kaylee Fox is a 60 y.o. year old female who is a primary care patient of Dorcas Carrow, DO .   Reason for referral: Programmer, applications  and Mental Health Counseling and Resources.    F/U phone call today to assess needs, progress and barriers with care plan goals.   Telephone outreach was unsuccessful. A HIPPA compliant phone message was left for the patient providing contact information and requesting a return call.   Plan:CCM LCSW will wait for return call. If no return call is received, LCSW will make another attempt within 30 days  Review of patient status, including review of consultants reports, relevant laboratory and other test results, and collaboration with appropriate care team members and the patient's provider was performed as part of comprehensive patient evaluation and provision of care management services.    Jenel Lucks, MSW, LCSW [facility] Care Management [place]   [facility] [email or web address] Phone [phone] 12:56 PM

## 2021-03-12 DIAGNOSIS — M329 Systemic lupus erythematosus, unspecified: Secondary | ICD-10-CM | POA: Diagnosis not present

## 2021-03-12 DIAGNOSIS — Z79899 Other long term (current) drug therapy: Secondary | ICD-10-CM | POA: Diagnosis not present

## 2021-03-20 ENCOUNTER — Ambulatory Visit (INDEPENDENT_AMBULATORY_CARE_PROVIDER_SITE_OTHER): Payer: Medicare HMO | Admitting: Licensed Clinical Social Worker

## 2021-03-20 DIAGNOSIS — F419 Anxiety disorder, unspecified: Secondary | ICD-10-CM

## 2021-03-20 DIAGNOSIS — M810 Age-related osteoporosis without current pathological fracture: Secondary | ICD-10-CM

## 2021-03-20 DIAGNOSIS — M797 Fibromyalgia: Secondary | ICD-10-CM

## 2021-03-20 DIAGNOSIS — F331 Major depressive disorder, recurrent, moderate: Secondary | ICD-10-CM

## 2021-03-25 NOTE — Patient Instructions (Signed)
Visit Information  Thank you for taking time to visit with me today. Please don't hesitate to contact me if I can be of assistance to you before our next scheduled telephone appointment.  Following are the goals we discussed today:  Patient Goals/Self-Care Activities: Over the next 120 days Attend scheduled appointments with providers Contact PCP office with any questions or concerns Utilize healthy coping skills discussed Continue with your self-care action plan   Our next appointment is by telephone on 05/27/21 at 3:00 PM  Please call the care guide team at [phone] if you need to cancel or reschedule your appointment.   If you are experiencing a Mental Health or [place] [facility] [place] or need someone to talk to, please call the Suicide and Crisis Lifeline: 988 call 911   Patient verbalizes understanding of instructions and care plan provided today and agrees to view in [place]. Active MyChart status confirmed with patient.    Christa See, MSW, [email or web address] Phone [phone] 8:32 AM

## 2021-03-25 NOTE — Chronic Care Management (AMB) (Signed)
Chronic Care Management    Clinical Social Work Note  03/25/2021 Name: Kaylee Fox MRN: [id number] DOB: [birth year]  Kaylee Fox is a 61 y.o. year old female who is a primary care patient of Kaylee Roys, DO. The CCM team was consulted to assist the patient with chronic disease management and/or care coordination needs related to: [employer]  and [facility] and Resources.   Engaged with patient by telephone for follow up visit in response to provider referral for social work chronic care management and care coordination services.   Consent to Services:  The patient was given information about Chronic Care Management services, agreed to services, and gave verbal consent prior to initiation of services.  Please see initial visit note for detailed documentation.   Patient agreed to services and consent obtained.    Summary:  Patient continues to maintain positive progress with care plan goals. Patient would like the ability to obtain medical care and register him for school. LCSW provided patient with info on clerk of courts where she can obtain info on obtaining Guardianship of minor. See Care Plan below for interventions and patient self-care activities.  Recommendation: Patient may benefit from, and is in agreement work with LCSW to address care coordination needs and will continue to work with the clinical team to address health care and disease management related needs.   Follow up Plan: Patient would like continued follow-up from CCM LCSW.  per patient's request will follow up in 05/27/21.  Will call office if needed prior to next encounter.     SDOH (Social Determinants of Health) assessments and interventions performed:    Advanced Directives Status: Not addressed in this encounter.  CCM Care Plan  Allergies  Allergen Reactions   Penicillins Rash   Clindamycin Nausea Only   Ciprofloxacin Other (See Comments)   Morphine Other (See Comments)     Outpatient Encounter Medications as of 03/20/2021  Medication Sig   acetaminophen (TYLENOL) 325 MG tablet Take by mouth as needed.    albuterol (VENTOLIN HFA) 108 (90 Base) MCG/ACT inhaler Inhale 2 puffs into the lungs every 6 (six) hours as needed for wheezing or shortness of breath.   amLODipine (NORVASC) 10 MG tablet Take 1 tablet (10 mg total) by mouth daily.   apixaban (ELIQUIS) 5 MG TABS tablet Take 1 tablet (5 mg total) by mouth 2 (two) times daily.   atorvastatin (LIPITOR) 40 MG tablet Take 1 tablet (40 mg total) by mouth daily.   azaTHIOprine (IMURAN) 50 MG tablet Take 100 mg by mouth daily.   baclofen (LIORESAL) 10 MG tablet Take 1 tablet (10 mg total) by mouth 3 (three) times daily.   buPROPion (WELLBUTRIN XL) 150 MG 24 hr tablet Take 1 tablet (150 mg total) by mouth daily.   busPIRone (BUSPAR) 5 MG tablet Take 1 tablet (5 mg total) by mouth 2 (two) times daily.   calcium carbonate (OS-CAL) 600 MG TABS tablet Take by mouth.   cetirizine (ZYRTEC) 10 MG tablet Take 1 tablet (10 mg total) by mouth daily.   Cholecalciferol 25 MCG (1000 UT) capsule Take 1,000 Units by mouth daily. Takes 2 1000 mg per day   diclofenac Sodium (VOLTAREN) 1 % GEL Apply 4 g topically 4 (four) times daily.   fluticasone (FLONASE) 50 MCG/ACT nasal spray USE 2 SPRAYS INTO BOTH NOSTRILS DAILY.   furosemide (LASIX) 40 MG tablet Take 2 tablets (80 mg total) by mouth 2 (two) times daily.   gabapentin (NEURONTIN) 300 MG  capsule Take 2 capsules (600 mg total) by mouth 2 (two) times daily.   hydroxychloroquine (PLAQUENIL) 200 MG tablet Take 400 mg by mouth 2 (two) times daily.    hydrOXYzine (ATARAX/VISTARIL) 25 MG tablet Take 1 tablet (25 mg total) by mouth at bedtime as needed.   levothyroxine (SYNTHROID) 75 MCG tablet Take 1 tablet (75 mcg total) by mouth daily before breakfast.   montelukast (SINGULAIR) 10 MG tablet Take 1 tablet (10 mg total) by mouth at bedtime.   Multiple Vitamin (MULTI-VITAMINS) TABS Take  by mouth.   mupirocin cream (BACTROBAN) 2 % Apply to affected area on arms/legs twice a day as needed.   nystatin (MYCOSTATIN) 100000 UNIT/ML suspension Take 5 mLs (500,000 Units total) by mouth 4 (four) times daily.   pantoprazole (PROTONIX) 40 MG tablet Take 1 tablet (40 mg total) by mouth daily.   potassium chloride SA (KLOR-CON) 20 MEQ tablet Take 1 tablet (20 mEq total) by mouth daily.   triamcinolone cream (KENALOG) 0.1 % Apply 1 application topically 2 (two) times daily.   venlafaxine XR (EFFEXOR-XR) 150 MG 24 hr capsule TAKE 1 CAPSULE DAILY WITH BREAKFAST with the 75mg  for 225mg  daily   venlafaxine XR (EFFEXOR-XR) 75 MG 24 hr capsule Take 1 capsule (75 mg total) by mouth daily with breakfast. To be taken with 150mg  for 225mg  total   No facility-administered encounter medications on file as of 03/20/2021.    Patient Active Problem List   Diagnosis Date Noted   Acquired thrombophilia ([place]) 01/12/2021   Pelvic pain 09/30/2017   Dyspareunia, female 09/15/2017   Vaginal atrophy 08/24/2017   Menopausal symptoms 08/24/2017   Varicose veins of both lower extremities with pain 11/09/2016   Mixed hyperlipidemia 08/20/2016   Advanced directives, counseling/discussion 08/20/2016   Depression    Anxiety    Benign hypertensive renal disease    Osteoporosis    Vitamin D deficiency    Fibromyalgia    History of pulmonary embolus (PE)    Lupus (systemic lupus erythematosus) (HCC)    Chronic hypokalemia    Autoimmune hypothyroidism    Neuromuscular disorder (HCC)    OSA on CPAP    Diverticulosis    Chronic pain    Herpes labialis 02/04/2016   Angina pectoris ([place]) 08/06/2015   Continuous opioid dependence ([place]) 08/06/2015   Immunosuppression ([place]) 08/06/2015   Long term current use of anticoagulant therapy 08/06/2015   Morbid obesity ([place]) 08/06/2015   On corticosteroid therapy 08/06/2015   History of pneumonia 08/06/2015   Pulmonary hypertensive arterial disease ([place]) 08/06/2015     Conditions to be addressed/monitored: Anxiety, Depression, and Chronic Pain  Care Plan : LCSW Plan of Care  Updates made by Rebekah Chesterfield, LCSW since 03/25/2021 12:00 AM     Problem: Coping Skills (General Plan of Care)      Long-Range Goal: Coping Skills Enhanced   Start Date: 02/02/2021  This Visit's Progress: On track  Recent Progress: On track  Priority: High  Note:   Current barriers:   Chronic Mental Health needs related to Anxiety and Depression Mental Health Concerns  and Family and relationship dysfunction Needs Support, Education, and Care Coordination in order to meet unmet mental health needs. Clinical Goal(s): demonstrate a reduction in symptoms related to :Depression: anxiety   Clinical Interventions:  Assessed patient's previous and current treatment, coping skills, support system and barriers to care  Patient reports increase in symptoms triggered by stress. Pt is currently providing care to minor grandson. She has not  heard from adult daughter in three months and does not have paperwork to initiate medical care/follow-up 1/13: Patient reports daughter contacted her around the holidays; however, did not visit her grandchild. Daughter has not discussed resuming care for pt's grandchild. Patient reports improved management of symptoms with participation with medication management  Patient would like the ability to obtain medical care and register him for school. LCSW provided patient with info on clerk of courts where she can obtain info on obtaining Guardianship of minor Patient identified strong support system, including boyfriend. She receives SNAP benefits and has stable transportation CCM LCSW discussed self-care strategies to assist in promoting relaxation and a positive mood Mindfulness or Relaxation training provided Active listening / Reflection utilized  Emotional Support Provided Provided psychoeducation for mental health needs  Quality of sleep  assessed & Sleep Hygiene techniques promoted  Caregiver stress acknowledged  Verbalization of feelings encouraged  ; Review resources, discussed options and provided patient information about  Options on how to obtain temporary guardianship 1:1 collaboration with primary care provider regarding development and update of comprehensive plan of care as evidenced by provider attestation and co-signature Inter-disciplinary care team collaboration (see longitudinal plan of care) Patient Goals/Self-Care Activities: Over the next 120 days Attend scheduled appointments with providers Contact PCP office with any questions or concerns Utilize healthy coping skills discussed Continue with your self-care action plan          Christa See, MSW, [email or web address] Phone [phone] 8:30 AM

## 2021-03-28 ENCOUNTER — Other Ambulatory Visit: Payer: Self-pay | Admitting: Family Medicine

## 2021-03-28 NOTE — Telephone Encounter (Signed)
Requested Prescriptions  Pending Prescriptions Disp Refills   buPROPion (WELLBUTRIN XL) 150 MG 24 hr tablet [Pharmacy Med Name: BUPROPION XL 150MG  TABLETS (24 H)] 60 tablet     Sig: TAKE 1 TABLET BY MOUTH EVERY MORNING FOR 1 WEEK THEN TAKE 1 TABLET BY MOUTH TWICE DAILY     Psychiatry: Antidepressants - bupropion Passed - 03/28/2021  3:16 AM      Passed - Completed PHQ-2 or PHQ-9 in the last 360 days      Passed - Last BP in normal range    BP Readings from Last 1 Encounters:  02/10/21 117/76         Passed - Valid encounter within last 6 months    Recent Outpatient Visits          1 month ago Plantar fasciitis of right foot   [facility] [place], Megan P, DO   2 months ago Routine general medical examination at a health care facility   [facility] [place], [place], DO   3 months ago Anxiety   [facility], NP   8 months ago Nasal congestion   [facility] [place], [place], DO   10 months ago Autoimmune hypothyroidism   [facility] [place], [place], DO      Future Appointments            In 3 months Johnson, Megan P, DO [facility]            Dose inconsistent with current med list, has newer rx with correct dose.

## 2021-04-07 DIAGNOSIS — M810 Age-related osteoporosis without current pathological fracture: Secondary | ICD-10-CM

## 2021-04-07 DIAGNOSIS — F331 Major depressive disorder, recurrent, moderate: Secondary | ICD-10-CM

## 2021-05-27 ENCOUNTER — Ambulatory Visit (INDEPENDENT_AMBULATORY_CARE_PROVIDER_SITE_OTHER): Payer: Medicare Other | Admitting: Licensed Clinical Social Worker

## 2021-05-27 DIAGNOSIS — F331 Major depressive disorder, recurrent, moderate: Secondary | ICD-10-CM

## 2021-05-27 DIAGNOSIS — F419 Anxiety disorder, unspecified: Secondary | ICD-10-CM

## 2021-05-27 DIAGNOSIS — M797 Fibromyalgia: Secondary | ICD-10-CM

## 2021-06-01 NOTE — Patient Instructions (Signed)
Visit Information ? ?Thank you for taking time to visit with me today. Please don't hesitate to contact me if I can be of assistance to you before our next scheduled telephone appointment. ? ?Following are the goals we discussed today:  ?Patient Goals/Self-Care Activities: Over the next 120 days ?Attend scheduled appointments with providers ?Contact PCP office with any questions or concerns ?Utilize healthy coping skills discussed ?Continue with your self-care action plan  ? ?Our next appointment is by telephone on 07/27/21 at 2:15 PM ? ?Please call the care guide team at [phone] if you need to cancel or reschedule your appointment.  ? ?If you are experiencing a Mental Health or Behavioral Health Crisis or need someone to talk to, please call the Suicide and Crisis Lifeline: 988 ?call 911  ? ?Patient verbalizes understanding of instructions and care plan provided today and agrees to view in MyChart. Active MyChart status confirmed with patient.   ? ?Jenel Lucks, MSW, LCSW ?[facility] ?[place]  [facility] ?[email or web address] ?Phone [phone] ?6:20 AM ?  ?

## 2021-06-01 NOTE — Chronic Care Management (AMB) (Signed)
?Chronic Care Management  ? ? Clinical Social Work Note ? ?06/01/2021 ?Name: Kaylee Fox MRN: [id number] DOB: 1960/05/05 ? ?Kaylee Fox is a 61 y.o. year old female who is a primary care patient of Dorcas Carrow, DO. The CCM team was consulted to assist the patient with chronic disease management and/or care coordination needs related to: [employer]  and Mental Health Counsel[facility] and Resources.  ? ?Engaged with patient by telephone for follow up visit in response to provider referral for social work chronic care management and care coordination services.  ? ?Consent to Services:  ?The patient was given information about Chronic Care Management services, agreed to services, and gave verbal consent prior to initiation of services.  Please see initial visit note for detailed documentation.  ? ?Patient agreed to services and consent obtained.  ? ?Assessment: Review of patient past medical history, allergies, medications, and health status, including review of relevant consultants reports was performed today as part of a comprehensive evaluation and provision of chronic care management and care coordination services.    ? ?SDOH (Social Determinants of Health) assessments and interventions performed:   ? ?Advanced Directives Status: Not addressed in this encounter. ? ?CCM Care Plan ? ?Allergies  ?Allergen Reactions  ? Penicillins Rash  ? Clindamycin Nausea Only  ? Ciprofloxacin Other (See Comments)  ? Morphine Other (See Comments)  ? ? ?Outpatient Encounter Medications as of 05/27/2021  ?Medication Sig  ? acetaminophen (TYLENOL) 325 MG tablet Take by mouth as needed.   ? albuterol (VENTOLIN HFA) 108 (90 Base) MCG/ACT inhaler Inhale 2 puffs into the lungs every 6 (six) hours as needed for wheezing or shortness of breath.  ? amLODipine (NORVASC) 10 MG tablet Take 1 tablet (10 mg total) by mouth daily.  ? apixaban (ELIQUIS) 5 MG TABS tablet Take 1 tablet (5 mg total) by mouth 2 (two) times daily.  ? atorvastatin  (LIPITOR) 40 MG tablet Take 1 tablet (40 mg total) by mouth daily.  ? azaTHIOprine (IMURAN) 50 MG tablet Take 100 mg by mouth daily.  ? baclofen (LIORESAL) 10 MG tablet Take 1 tablet (10 mg total) by mouth 3 (three) times daily.  ? buPROPion (WELLBUTRIN XL) 150 MG 24 hr tablet Take 1 tablet (150 mg total) by mouth daily.  ? busPIRone (BUSPAR) 5 MG tablet Take 1 tablet (5 mg total) by mouth 2 (two) times daily.  ? calcium carbonate (OS-CAL) 600 MG TABS tablet Take by mouth.  ? cetirizine (ZYRTEC) 10 MG tablet Take 1 tablet (10 mg total) by mouth daily.  ? Cholecalciferol 25 MCG (1000 UT) capsule Take 1,000 Units by mouth daily. Takes 2 1000 mg per day  ? diclofenac Sodium (VOLTAREN) 1 % GEL Apply 4 g topically 4 (four) times daily.  ? fluticasone (FLONASE) 50 MCG/ACT nasal spray USE 2 SPRAYS INTO BOTH NOSTRILS DAILY.  ? furosemide (LASIX) 40 MG tablet Take 2 tablets (80 mg total) by mouth 2 (two) times daily.  ? gabapentin (NEURONTIN) 300 MG capsule Take 2 capsules (600 mg total) by mouth 2 (two) times daily.  ? hydroxychloroquine (PLAQUENIL) 200 MG tablet Take 400 mg by mouth 2 (two) times daily.   ? hydrOXYzine (ATARAX/VISTARIL) 25 MG tablet Take 1 tablet (25 mg total) by mouth at bedtime as needed.  ? levothyroxine (SYNTHROID) 75 MCG tablet Take 1 tablet (75 mcg total) by mouth daily before breakfast.  ? montelukast (SINGULAIR) 10 MG tablet Take 1 tablet (10 mg total) by mouth at bedtime.  ? Multiple  Vitamin (MULTI-VITAMINS) TABS Take by mouth.  ? mupirocin cream (BACTROBAN) 2 % Apply to affected area on arms/legs twice a day as needed.  ? nystatin (MYCOSTATIN) 100000 UNIT/ML suspension Take 5 mLs (500,000 Units total) by mouth 4 (four) times daily.  ? pantoprazole (PROTONIX) 40 MG tablet Take 1 tablet (40 mg total) by mouth daily.  ? potassium chloride SA (KLOR-CON) 20 MEQ tablet Take 1 tablet (20 mEq total) by mouth daily.  ? triamcinolone cream (KENALOG) 0.1 % Apply 1 application topically 2 (two) times daily.   ? venlafaxine XR (EFFEXOR-XR) 150 MG 24 hr capsule TAKE 1 CAPSULE DAILY WITH BREAKFAST with the 75mg  for 225mg  daily  ? venlafaxine XR (EFFEXOR-XR) 75 MG 24 hr capsule Take 1 capsule (75 mg total) by mouth daily with breakfast. To be taken with 150mg  for 225mg  total  ? ?No facility-administered encounter medications on file as of 05/27/2021.  ? ? ?Patient Active Problem List  ? Diagnosis Date Noted  ? Acquired thrombophilia (HCC) 01/12/2021  ? Pelvic pain 09/30/2017  ? Dyspareunia, female 09/15/2017  ? Vaginal atrophy 08/24/2017  ? Menopausal symptoms 08/24/2017  ? Varicose veins of both lower extremities with pain 11/09/2016  ? Mixed hyperlipidemia 08/20/2016  ? Advanced directives, counseling/discussion 08/20/2016  ? Depression   ? Anxiety   ? Benign hypertensive renal disease   ? Osteoporosis   ? Vitamin D deficiency   ? Fibromyalgia   ? History of pulmonary embolus (PE)   ? Lupus (systemic lupus erythematosus) (HCC)   ? Chronic hypokalemia   ? Autoimmune hypothyroidism   ? Neuromuscular disorder (HCC)   ? OSA on CPAP   ? Diverticulosis   ? Chronic pain   ? Herpes labialis 02/04/2016  ? Angina pectoris (HCC) 08/06/2015  ? Continuous opioid dependence (HCC) 08/06/2015  ? Immunosuppression (HCC) 08/06/2015  ? Long term current use of anticoagulant therapy 08/06/2015  ? Morbid obesity (HCC) 08/06/2015  ? On corticosteroid therapy 08/06/2015  ? History of pneumonia 08/06/2015  ? Pulmonary hypertensive arterial disease (HCC) 08/06/2015  ? ? ?Conditions to be addressed/monitored: Anxiety and Depression ? ?Care Plan : LCSW Plan of Care  ?Updates made by 08/08/2015, LCSW since 06/01/2021 12:00 AM  ?  ? ?Problem: Coping Skills (General Plan of Care)   ?  ? ?Long-Range Goal: Coping Skills Enhanced   ?Start Date: 02/02/2021  ?This Visit's Progress: On track  ?Recent Progress: On track  ?Priority: High  ?Note:   ?Current barriers:   ?Chronic Mental Health needs related to Anxiety and Depression ?Mental Health Concerns   and Family and relationship dysfunction ?Needs Support, Education, and Care Coordination in order to meet unmet mental health needs. ?Clinical Goal(s): demonstrate a reduction in symptoms related to :Depression: anxiety   ?Clinical Interventions:  ?Assessed patient's previous and current treatment, coping skills, support system and barriers to care  ?Patient reports increase in symptoms triggered by stress. Pt is currently providing care to minor grandson. She has not heard from adult daughter in three months and does not have paperwork to initiate medical care/follow-up 1/13: Patient reports daughter contacted her around the holidays; however, did not visit her grandchild. Daughter has not discussed resuming care for pt's grandchild 3/22: Pt reports improvement in stress management ?Patient reports improved management of symptoms with participation with medication management  ?Patient would like the ability to obtain medical care and register him for school. LCSW provided patient with info on clerk of courts where she can obtain info on obtaining  Guardianship of minor 3/22: Patient reports receiving strong support from DSS in obtaining custody of grandson. They have an upcoming court hearing in April. Pt has obtained a PCP and Dentist for grandchild ?Patient identified strong support system, including boyfriend. She receives SNAP benefits and has stable transportation ?CCM LCSW discussed self-care strategies to assist in promoting relaxation and a positive mood ?Mindfulness or Relaxation training provided ?Active listening / Reflection utilized  ?Emotional Support Provided ?Provided psychoeducation for mental health needs  ?Quality of sleep assessed & Sleep Hygiene techniques promoted  ?Caregiver stress acknowledged  ?Verbalization of feelings encouraged  ; ?Review resources, discussed options and provided patient information about  ?Options on how to obtain temporary guardianship ?1:1 collaboration with primary  care provider regarding development and update of comprehensive plan of care as evidenced by provider attestation and co-signature ?Inter-disciplinary care team collaboration (see longitudinal plan of c

## 2021-06-05 DIAGNOSIS — F32A Depression, unspecified: Secondary | ICD-10-CM

## 2021-06-18 ENCOUNTER — Other Ambulatory Visit: Payer: Self-pay | Admitting: Family Medicine

## 2021-06-18 ENCOUNTER — Telehealth: Payer: Self-pay | Admitting: Family Medicine

## 2021-06-18 MED ORDER — APIXABAN 5 MG PO TABS: 5.0000 mg | ORAL_TABLET | Freq: Two times a day (BID) | ORAL | 2 refills | Status: DC

## 2021-06-18 MED ORDER — PANTOPRAZOLE SODIUM 40 MG PO TBEC
40.0000 mg | DELAYED_RELEASE_TABLET | Freq: Every day | ORAL | 1 refills | Status: DC
Start: 2021-06-18 — End: 2021-10-07

## 2021-06-18 MED ORDER — POTASSIUM CHLORIDE CRYS ER 20 MEQ PO TBCR: 20.0000 meq | EXTENDED_RELEASE_TABLET | Freq: Every day | ORAL | 1 refills | Status: DC

## 2021-06-18 MED ORDER — ALBUTEROL SULFATE HFA 108 (90 BASE) MCG/ACT IN AERS: 2.0000 | INHALATION_SPRAY | Freq: Four times a day (QID) | RESPIRATORY_TRACT | 6 refills | Status: DC | PRN

## 2021-06-18 MED ORDER — FUROSEMIDE 40 MG PO TABS
80.0000 mg | ORAL_TABLET | Freq: Two times a day (BID) | ORAL | 1 refills | Status: DC
Start: 2021-06-18 — End: 2021-10-07

## 2021-06-18 MED ORDER — BUPROPION HCL ER (XL) 150 MG PO TB24
150.0000 mg | ORAL_TABLET | Freq: Every day | ORAL | 1 refills | Status: DC
Start: 2021-06-18 — End: 2021-10-07

## 2021-06-18 MED ORDER — LEVOTHYROXINE SODIUM 75 MCG PO TABS: 75.0000 ug | ORAL_TABLET | Freq: Every day | ORAL | 3 refills | Status: DC

## 2021-06-18 MED ORDER — ATORVASTATIN CALCIUM 40 MG PO TABS: 40.0000 mg | ORAL_TABLET | Freq: Every day | ORAL | 1 refills | Status: DC

## 2021-06-18 MED ORDER — HYDROXYZINE HCL 25 MG PO TABS: 25.0000 mg | ORAL_TABLET | Freq: Every evening | ORAL | 1 refills | Status: DC | PRN

## 2021-06-18 MED ORDER — BUSPIRONE HCL 5 MG PO TABS: 5.0000 mg | ORAL_TABLET | Freq: Two times a day (BID) | ORAL | 1 refills | Status: DC

## 2021-06-18 MED ORDER — VENLAFAXINE HCL ER 75 MG PO CP24: 75.0000 mg | ORAL_CAPSULE | Freq: Every day | ORAL | 1 refills | Status: DC

## 2021-06-18 MED ORDER — GABAPENTIN 300 MG PO CAPS
600.0000 mg | ORAL_CAPSULE | Freq: Two times a day (BID) | ORAL | 1 refills | Status: DC
Start: 2021-06-18 — End: 2022-01-15

## 2021-06-18 MED ORDER — VENLAFAXINE HCL ER 150 MG PO CP24: ORAL_CAPSULE | ORAL | 1 refills | Status: DC

## 2021-06-18 MED ORDER — MONTELUKAST SODIUM 10 MG PO TABS: 10.0000 mg | ORAL_TABLET | Freq: Every day | ORAL | 1 refills | Status: DC

## 2021-06-18 MED ORDER — CETIRIZINE HCL 10 MG PO TABS: 10.0000 mg | ORAL_TABLET | Freq: Every day | ORAL | 11 refills | Status: DC

## 2021-06-18 MED ORDER — AMLODIPINE BESYLATE 10 MG PO TABS: 10.0000 mg | ORAL_TABLET | Freq: Every day | ORAL | 1 refills | Status: DC

## 2021-06-18 NOTE — Telephone Encounter (Signed)
Requested Prescriptions  ?Pending Prescriptions Disp Refills  ?? apixaban (ELIQUIS) 5 MG TABS tablet 180 tablet 2  ?  Sig: Take 1 tablet (5 mg total) by mouth 2 (two) times daily.  ?  ? Hematology:  Anticoagulants - apixaban Passed - 06/18/2021  9:25 AM  ?  ?  Passed - PLT in normal range and within 360 days  ?  Platelets  ?Date Value Ref Range Status  ?01/12/2021 243 150 - 450 x10E3/uL Final  ?   ?  ?  Passed - HGB in normal range and within 360 days  ?  Hemoglobin  ?Date Value Ref Range Status  ?01/12/2021 13.1 11.1 - 15.9 g/dL Final  ?   ?  ?  Passed - HCT in normal range and within 360 days  ?  Hematocrit  ?Date Value Ref Range Status  ?01/12/2021 39.6 34.0 - 46.6 % Final  ?   ?  ?  Passed - Cr in normal range and within 360 days  ?  Creatinine, Ser  ?Date Value Ref Range Status  ?01/12/2021 0.83 0.57 - 1.00 mg/dL Final  ?   ?  ?  Passed - AST in normal range and within 360 days  ?  AST  ?Date Value Ref Range Status  ?01/12/2021 24 0 - 40 IU/L Final  ?   ?  ?  Passed - ALT in normal range and within 360 days  ?  ALT  ?Date Value Ref Range Status  ?01/12/2021 17 0 - 32 IU/L Final  ?   ?  ?  Passed - Valid encounter within last 12 months  ?  Recent Outpatient Visits   ?      ? 4 months ago Plantar fasciitis of right foot  ? [facility] [place], Megan P, DO  ? 5 months ago Routine general medical examination at a health care facility  ? [facility] [place], Megan P, DO  ? 6 months ago Anxiety  ? [facility] Larae [place], NP  ? 11 months ago Nasal congestion  ? [facility] [place], [place] P, DO  ? 1 year ago Autoimmune hypothyroidism  ? [facility] [place], [place] P, DO  ?  ?  ?Future Appointments   ?        ? In 3 weeks Laural Benes, Oralia Rud, DO [facility]  ?  ? ?  ?  ?  ? ?

## 2021-06-18 NOTE — Telephone Encounter (Signed)
Pt is now using [facility] and would like her med list faxed to them so they can request her meds. ? ?[facility] ([facility] ) - [place], [place] - [address] ?

## 2021-06-18 NOTE — Telephone Encounter (Signed)
Per [facility] new prescriptions must be sent in.  ?

## 2021-06-18 NOTE — Telephone Encounter (Signed)
Medication Refill - Medication: apixaban (ELIQUIS) 5 MG TABS tablet ? ?Has the patient contacted their pharmacy? No. No, more refills.  ? ?(Agent: If no, request that the patient contact the pharmacy for the refill. If patient does not wish to contact the pharmacy document the reason why and proceed with request.) ? ? ?Preferred Pharmacy (with phone number or street name):  ?Has the patient been seen for an appointment in the last year OR does the p ?[id number] - [place], [place] - [address] AT [facility] [address]  ?[address] [place] [address]  ?Phone: [phone] Fax: [phone]  ?Hours: Not open 24 hours  ?atient have an upcoming appointment? Yes.   ? ?Agent: Please be advised that RX refills may take up to 3 business days. We ask that you follow-up with your pharmacy.  ?

## 2021-06-24 ENCOUNTER — Other Ambulatory Visit: Payer: Self-pay

## 2021-06-24 MED ORDER — BACLOFEN 10 MG PO TABS: 10.0000 mg | ORAL_TABLET | Freq: Three times a day (TID) | ORAL | 1 refills | Status: DC

## 2021-07-13 ENCOUNTER — Ambulatory Visit: Payer: Medicare Other | Admitting: Family Medicine

## 2021-07-17 ENCOUNTER — Encounter: Payer: Self-pay | Admitting: Family Medicine

## 2021-07-17 ENCOUNTER — Ambulatory Visit (INDEPENDENT_AMBULATORY_CARE_PROVIDER_SITE_OTHER): Payer: Medicare Other | Admitting: Family Medicine

## 2021-07-17 VITALS — BP 105/66 | HR 71 | Temp 98.2°F | Wt 273.8 lb

## 2021-07-17 DIAGNOSIS — E876 Hypokalemia: Secondary | ICD-10-CM

## 2021-07-17 DIAGNOSIS — I83813 Varicose veins of bilateral lower extremities with pain: Secondary | ICD-10-CM | POA: Diagnosis not present

## 2021-07-17 DIAGNOSIS — E063 Autoimmune thyroiditis: Secondary | ICD-10-CM | POA: Diagnosis not present

## 2021-07-17 DIAGNOSIS — I2721 Secondary pulmonary arterial hypertension: Secondary | ICD-10-CM | POA: Diagnosis not present

## 2021-07-17 DIAGNOSIS — D6869 Other thrombophilia: Secondary | ICD-10-CM | POA: Diagnosis not present

## 2021-07-17 DIAGNOSIS — I129 Hypertensive chronic kidney disease with stage 1 through stage 4 chronic kidney disease, or unspecified chronic kidney disease: Secondary | ICD-10-CM | POA: Diagnosis not present

## 2021-07-17 DIAGNOSIS — I209 Angina pectoris, unspecified: Secondary | ICD-10-CM

## 2021-07-17 DIAGNOSIS — F419 Anxiety disorder, unspecified: Secondary | ICD-10-CM

## 2021-07-17 DIAGNOSIS — M329 Systemic lupus erythematosus, unspecified: Secondary | ICD-10-CM | POA: Diagnosis not present

## 2021-07-17 DIAGNOSIS — E559 Vitamin D deficiency, unspecified: Secondary | ICD-10-CM

## 2021-07-17 DIAGNOSIS — E782 Mixed hyperlipidemia: Secondary | ICD-10-CM | POA: Diagnosis not present

## 2021-07-17 DIAGNOSIS — F331 Major depressive disorder, recurrent, moderate: Secondary | ICD-10-CM | POA: Diagnosis not present

## 2021-07-17 NOTE — Assessment & Plan Note (Signed)
Under good control on current regimen. Continue current regimen. Continue to monitor. Call with any concerns. Refills given. Labs drawn today.   

## 2021-07-17 NOTE — Assessment & Plan Note (Signed)
Continue to follow with cardiology. Call with any concerns.  

## 2021-07-17 NOTE — Assessment & Plan Note (Signed)
Will get her back into see Dr. Wyn Quaker, referral generated today. ?

## 2021-07-17 NOTE — Assessment & Plan Note (Signed)
Rechecking labs today. Await results. Treat as needed.  °

## 2021-07-17 NOTE — Progress Notes (Signed)
? ?BP 105/66   Pulse 71   Temp 98.2 ?F (36.8 ?C)   Wt 273 lb 12.8 oz (124.2 kg)   SpO2 98%   BMI 48.50 kg/m?   ? ?Subjective:  ? ? Patient ID: Kaylee Fox, female    DOB: Dec 11, 1960, 61 y.o.   MRN: [id number] ? ?HPI: ?Kaylee Fox is a 61 y.o. female ? ?Chief Complaint  ?Patient presents with  ? Hypertension  ? Anxiety  ? Depression  ? Hyperlipidemia  ? ?HYPERTENSION / HYPERLIPIDEMIA ?Satisfied with current treatment? yes ?Duration of hypertension: chronic ?BP monitoring frequency: not checking ?BP medication side effects: no ?Past BP meds: amlodipine, lasix ?Duration of hyperlipidemia: chronic ?Cholesterol medication side effects: no ?Cholesterol supplements: none ?Past cholesterol medications: atorvastatin ?Medication compliance: good compliance ?Aspirin: no ?Recent stressors: yes ?Recurrent headaches: no ?Visual changes: no ?Palpitations: no ?Dyspnea: no ?Chest pain: no ?Lower extremity edema: yes ?Dizzy/lightheaded: no ? ?ANXIETY/DEPRESSION ?Duration: chronic ?Status:stable ?Anxious mood: yes  ?Excessive worrying: no ?Irritability: no  ?Sweating: no ?Nausea: no ?Palpitations:no ?Hyperventilation: no ?Panic attacks: no ?Agoraphobia: no  ?Obscessions/compulsions: no ?Depressed mood: yes ? ?  07/17/2021  ?  3:26 PM 02/10/2021  ?  3:42 PM 01/12/2021  ?  2:12 PM 11/29/2020  ? 12:11 PM 07/10/2020  ?  3:43 PM  ?Depression screen PHQ 2/9  ?Decreased Interest 2 0 _0 ?Down, Depressed, Hopeless 0 0 _1 ?PHQ - 2 Score 2 0 _2 ?Altered sleeping _3 ?Tired, decreased energy _4 ?Change in appetite 2 0 _5 ?Feeling bad or failure about yourself  1 0 1 0 1  ?Trouble concentrating 2 0 1 0 1  ?Moving slowly or fidgety/restless 1 0 1 0 1  ?Suicidal thoughts 0 0 0 0 1  ?PHQ-9 Score _6 ?Difficult doing work/chores Not difficult at all  Somewhat difficult Somewhat difficult Somewhat difficult  ? ?Anhedonia: no ?Weight changes: no ?Insomnia: no   ?Hypersomnia: no ?Fatigue/loss of energy:  yes ?Feelings of worthlessness: no ?Feelings of guilt: no ?Impaired concentration/indecisiveness: no ?Suicidal ideations: no  ?Crying spells: no ?Recent Stressors/Life Changes: yes ?  Relationship problems: yes ?  Family stress: yes   ?  Financial stress: no  ?  Job stress: no  ?  Recent death/loss: no ? ? ?Relevant past medical, surgical, family and social history reviewed and updated as indicated. Interim medical history since our last visit reviewed. ?Allergies and medications reviewed and updated. ? ?Review of Systems  ?Constitutional: Negative.   ?Respiratory: Negative.    ?Cardiovascular:  Positive for leg swelling. Negative for chest pain and palpitations.  ?Gastrointestinal: Negative.   ?Musculoskeletal: Negative.   ?Neurological: Negative.   ?Psychiatric/Behavioral: Negative.    ? ?Per HPI unless specifically indicated above ? ?   ?Objective:  ?  ?BP 105/66   Pulse 71   Temp 98.2 ?F (36.8 ?C)   Wt 273 lb 12.8 oz (124.2 kg)   SpO2 98%   BMI 48.50 kg/m?   ?Wt Readings from Last 3 Encounters:  ?07/17/21 273 lb 12.8 oz (124.2 kg)  ?02/10/21 273 lb 12.8 oz (124.2 kg)  ?01/12/21 273 lb (123.8 kg)  ?  ?Physical Exam ?Vitals and nursing note reviewed.  ?Constitutional:   ?   General: She is not in acute distress. ?   Appearance: Normal appearance. She is obese. She is not ill-appearing, toxic-appearing or diaphoretic.  ?  HENT:  ?   Head: Normocephalic and atraumatic.  ?   Right Ear: External ear normal.  ?   Left Ear: External ear normal.  ?   Nose: Nose normal.  ?   Mouth/Throat:  ?   Mouth: Mucous membranes are moist.  ?   Pharynx: Oropharynx is clear.  ?Eyes:  ?   General: No scleral icterus.    ?   Right eye: No discharge.     ?   Left eye: No discharge.  ?   Extraocular Movements: Extraocular movements intact.  ?   Conjunctiva/sclera: Conjunctivae normal.  ?   Pupils: Pupils are equal, round, and reactive to light.  ?Cardiovascular:  ?   Rate and Rhythm: Normal rate and regular rhythm.  ?   Pulses: Normal  pulses.  ?   Heart sounds: Normal heart sounds. No murmur heard. ?  No friction rub. No gallop.  ?Pulmonary:  ?   Effort: Pulmonary effort is normal. No respiratory distress.  ?   Breath sounds: Normal breath sounds. No stridor. No wheezing, rhonchi or rales.  ?Chest:  ?   Chest wall: No tenderness.  ?Musculoskeletal:     ?   General: Normal range of motion.  ?   Cervical back: Normal range of motion and neck supple.  ?   Right lower leg: Edema present.  ?   Left lower leg: Edema present.  ?Skin: ?   General: Skin is warm and dry.  ?   Capillary Refill: Capillary refill takes less than 2 seconds.  ?   Coloration: Skin is not jaundiced or pale.  ?   Findings: No bruising, erythema, lesion or rash.  ?Neurological:  ?   General: No focal deficit present.  ?   Mental Status: She is alert and oriented to person, place, and time. Mental status is at baseline.  ?Psychiatric:     ?   Mood and Affect: Mood normal.     ?   Behavior: Behavior normal.     ?   Thought Content: Thought content normal.     ?   Judgment: Judgment normal.  ? ? ?Results for orders placed or performed in visit on 01/12/21  ?Microscopic Examination  ? Urine  ?Result Value Ref Range  ? WBC, UA 0-5 0 - 5 /hpf  ? RBC None seen 0 - 2 /hpf  ? Epithelial Cells (non renal) None seen 0 - 10 /hpf  ? Bacteria, UA None seen None seen/Few  ?CBC with Differential/Platelet  ?Result Value Ref Range  ? WBC 4.7 3.4 - 10.8 x10E3/uL  ? RBC 4.14 3.77 - 5.28 x10E6/uL  ? Hemoglobin 13.1 11.1 - 15.9 g/dL  ? Hematocrit 39.6 34.0 - 46.6 %  ? MCV 96 79 - 97 fL  ? MCH 31.6 26.6 - 33.0 pg  ? MCHC 33.1 31.5 - 35.7 g/dL  ? RDW 11.5 (L) 11.7 - 15.4 %  ? Platelets 243 150 - 450 x10E3/uL  ? Neutrophils 54 Not Estab. %  ? Lymphs 36 Not Estab. %  ? Monocytes 10 Not Estab. %  ? Eos 0 Not Estab. %  ? Basos 0 Not Estab. %  ? Neutrophils Absolute 2.6 1.4 - 7.0 x10E3/uL  ? Lymphocytes Absolute 1.7 0.7 - 3.1 x10E3/uL  ? Monocytes Absolute 0.5 0.1 - 0.9 x10E3/uL  ? EOS (ABSOLUTE) 0.0 0.0 - 0.4  x10E3/uL  ? Basophils Absolute 0.0 0.0 - 0.2 x10E3/uL  ? Immature Granulocytes 0 Not Estab. %  ?  Immature Grans (Abs) 0.0 0.0 - 0.1 x10E3/uL  ?Comprehensive metabolic panel  ?Result Value Ref Range  ? Glucose 93 70 - 99 mg/dL  ? BUN 12 6 - 24 mg/dL  ? Creatinine, Ser 0.83 0.57 - 1.00 mg/dL  ? eGFR 81 >59 mL/min/1.73  ? BUN/Creatinine Ratio 14 9 - 23  ? Sodium 141 134 - 144 mmol/L  ? Potassium 4.0 3.5 - 5.2 mmol/L  ? Chloride 103 96 - 106 mmol/L  ? CO2 25 20 - 29 mmol/L  ? Calcium 8.7 8.7 - 10.2 mg/dL  ? Total Protein 6.9 6.0 - 8.5 g/dL  ? Albumin 4.3 3.8 - 4.9 g/dL  ? Globulin, Total 2.6 1.5 - 4.5 g/dL  ? Albumin/Globulin Ratio 1.7 1.2 - 2.2  ? Bilirubin Total 0.4 0.0 - 1.2 mg/dL  ? Alkaline Phosphatase 122 (H) 44 - 121 IU/L  ? AST 24 0 - 40 IU/L  ? ALT 17 0 - 32 IU/L  ?Lipid Panel w/o Chol/HDL Ratio  ?Result Value Ref Range  ? Cholesterol, Total 188 100 - 199 mg/dL  ? Triglycerides 80 0 - 149 mg/dL  ? HDL 52 >39 mg/dL  ? VLDL Cholesterol Cal 15 5 - 40 mg/dL  ? LDL Chol Calc (NIH) 121 (H) 0 - 99 mg/dL  ?Urinalysis, Routine w reflex microscopic  ?Result Value Ref Range  ? Specific Gravity, UA 1.025 1.005 - 1.030  ? pH, UA 5.0 5.0 - 7.5  ? Color, UA Yellow Yellow  ? Appearance Ur Clear Clear  ? Leukocytes,UA 1+ (A) Negative  ? Protein,UA Negative Negative/Trace  ? Glucose, UA Negative Negative  ? Ketones, UA Negative Negative  ? RBC, UA Negative Negative  ? Bilirubin, UA Negative Negative  ? Urobilinogen, Ur 1.0 0.2 - 1.0 mg/dL  ? Nitrite, UA Negative Negative  ? Microscopic Examination See below:   ?TSH  ?Result Value Ref Range  ? TSH 2.290 0.450 - 4.500 uIU/mL  ?Microalbumin, Urine Waived  ?Result Value Ref Range  ? Microalb, Ur Waived 30 (H) 0 - 19 mg/L  ? Creatinine, Urine Waived 100 10 - 300 mg/dL  ? Microalb/Creat Ratio <30 <30 mg/g  ?Bayer DCA Hb A1c Waived  ?Result Value Ref Range  ? HB A1C (BAYER DCA - WAIVED) 5.1 4.8 - 5.6 %  ?VITAMIN D 25 Hydroxy (Vit-D Deficiency, Fractures)  ?Result Value Ref Range  ? Vit  D, 25-Hydroxy 36.9 30.0 - 100.0 ng/mL  ? ?   ?Assessment & Plan:  ? ?Problem List Items Addressed This Visit   ? ?  ? Cardiovascular and Mediastinum  ? Angina pectoris ([place])  ?  Continue to follow with cardiology. Ca

## 2021-07-17 NOTE — Assessment & Plan Note (Signed)
Continue to follow with rheumatology. Call with any concerns. Continue to monitor.  

## 2021-07-17 NOTE — Assessment & Plan Note (Signed)
Under good control on current regimen. Continue current regimen. Continue to monitor. Call with any concerns. Refills given.   

## 2021-07-17 NOTE — Assessment & Plan Note (Signed)
Encouraged diet and exercise with goal of losing 1-2lbs per week.  

## 2021-07-18 LAB — CBC WITH DIFFERENTIAL/PLATELET
Basophils Absolute: 0 10*3/uL (ref 0.0–0.2)
Basos: 0 %
EOS (ABSOLUTE): 0 10*3/uL (ref 0.0–0.4)
Eos: 0 %
Hematocrit: 36.6 % (ref 34.0–46.6)
Hemoglobin: 12.5 g/dL (ref 11.1–15.9)
Immature Grans (Abs): 0 10*3/uL (ref 0.0–0.1)
Immature Granulocytes: 0 %
Lymphocytes Absolute: 1.7 10*3/uL (ref 0.7–3.1)
Lymphs: 35 %
MCH: 32.6 pg (ref 26.6–33.0)
MCHC: 34.2 g/dL (ref 31.5–35.7)
MCV: 95 fL (ref 79–97)
Monocytes Absolute: 0.5 10*3/uL (ref 0.1–0.9)
Monocytes: 9 %
Neutrophils Absolute: 2.7 10*3/uL (ref 1.4–7.0)
Neutrophils: 56 %
Platelets: 240 10*3/uL (ref 150–450)
RBC: 3.84 x10E6/uL (ref 3.77–5.28)
RDW: 11.8 % (ref 11.7–15.4)
WBC: 4.9 10*3/uL (ref 3.4–10.8)

## 2021-07-18 LAB — COMPREHENSIVE METABOLIC PANEL
ALT: 23 IU/L (ref 0–32)
AST: 28 IU/L (ref 0–40)
Albumin/Globulin Ratio: 1.6 (ref 1.2–2.2)
Albumin: 4 g/dL (ref 3.8–4.9)
Alkaline Phosphatase: 114 IU/L (ref 44–121)
BUN/Creatinine Ratio: 14 (ref 9–23)
BUN: 12 mg/dL (ref 6–24)
Bilirubin Total: 0.4 mg/dL (ref 0.0–1.2)
CO2: 27 mmol/L (ref 20–29)
Calcium: 8.7 mg/dL (ref 8.7–10.2)
Chloride: 105 mmol/L (ref 96–106)
Creatinine, Ser: 0.84 mg/dL (ref 0.57–1.00)
Globulin, Total: 2.5 g/dL (ref 1.5–4.5)
Glucose: 84 mg/dL (ref 70–99)
Potassium: 3.7 mmol/L (ref 3.5–5.2)
Sodium: 144 mmol/L (ref 134–144)
Total Protein: 6.5 g/dL (ref 6.0–8.5)
eGFR: 80 mL/min/{1.73_m2} (ref >59)

## 2021-07-18 LAB — LIPID PANEL W/O CHOL/HDL RATIO
Cholesterol, Total: 151 mg/dL (ref 100–199)
HDL: 52 mg/dL (ref >39)
LDL Chol Calc (NIH): 84 mg/dL (ref 0–99)
Triglycerides: 80 mg/dL (ref 0–149)
VLDL Cholesterol Cal: 15 mg/dL (ref 5–40)

## 2021-07-18 LAB — TSH: TSH: 2.06 u[IU]/mL (ref 0.450–4.500)

## 2021-07-18 LAB — VITAMIN D 25 HYDROXY (VIT D DEFICIENCY, FRACTURES): Vit D, 25-Hydroxy: 28.5 ng/mL — ABNORMAL LOW (ref 30.0–100.0)

## 2021-07-27 ENCOUNTER — Ambulatory Visit (INDEPENDENT_AMBULATORY_CARE_PROVIDER_SITE_OTHER): Payer: Medicare Other | Admitting: Licensed Clinical Social Worker

## 2021-07-27 DIAGNOSIS — F419 Anxiety disorder, unspecified: Secondary | ICD-10-CM

## 2021-07-27 DIAGNOSIS — F331 Major depressive disorder, recurrent, moderate: Secondary | ICD-10-CM

## 2021-07-29 NOTE — Chronic Care Management (AMB) (Signed)
Chronic Care Management    Clinical Social Work Note  07/29/2021 Name: Chasity Cumplido MRN: [id number] DOB: 1960/10/11  Marlyne Melody is a 61 y.o. year old female who is a primary care patient of Valerie Roys, DO. The CCM team was consulted to assist the patient with chronic disease management and/or care coordination needs related to: Mental Health Counseling and Resources.   Engaged with patient by telephone for follow up visit in response to provider referral for social work chronic care management and care coordination services.   Consent to Services:  The patient was given information about Chronic Care Management services, agreed to services, and gave verbal consent prior to initiation of services.  Please see initial visit note for detailed documentation.   Patient agreed to services and consent obtained.   Assessment: Review of patient past medical history, allergies, medications, and health status, including review of relevant consultants reports was performed today as part of a comprehensive evaluation and provision of chronic care management and care coordination services.     SDOH (Social Determinants of Health) assessments and interventions performed:    Advanced Directives Status: Not addressed in this encounter.  CCM Care Plan  Allergies  Allergen Reactions   Penicillins Rash   Clindamycin Nausea Only   Ciprofloxacin Other (See Comments)   Morphine Other (See Comments)    Outpatient Encounter Medications as of 07/27/2021  Medication Sig   acetaminophen (TYLENOL) 325 MG tablet Take by mouth as needed.    albuterol (VENTOLIN HFA) 108 (90 Base) MCG/ACT inhaler Inhale 2 puffs into the lungs every 6 (six) hours as needed for wheezing or shortness of breath.   amLODipine (NORVASC) 10 MG tablet Take 1 tablet (10 mg total) by mouth daily.   apixaban (ELIQUIS) 5 MG TABS tablet Take 1 tablet (5 mg total) by mouth 2 (two) times daily.   atorvastatin (LIPITOR) 40 MG tablet  Take 1 tablet (40 mg total) by mouth daily.   azaTHIOprine (IMURAN) 50 MG tablet Take 100 mg by mouth daily.   baclofen (LIORESAL) 10 MG tablet Take 1 tablet (10 mg total) by mouth 3 (three) times daily.   buPROPion (WELLBUTRIN XL) 150 MG 24 hr tablet Take 1 tablet (150 mg total) by mouth daily.   busPIRone (BUSPAR) 5 MG tablet Take 1 tablet (5 mg total) by mouth 2 (two) times daily.   calcium carbonate (OS-CAL) 600 MG TABS tablet Take by mouth.   cetirizine (ZYRTEC) 10 MG tablet Take 1 tablet (10 mg total) by mouth daily.   Cholecalciferol 25 MCG (1000 UT) capsule Take 1,000 Units by mouth daily. Takes 2 1000 mg per day   diclofenac Sodium (VOLTAREN) 1 % GEL Apply 4 g topically 4 (four) times daily.   fluticasone (FLONASE) 50 MCG/ACT nasal spray USE 2 SPRAYS INTO BOTH NOSTRILS DAILY.   furosemide (LASIX) 40 MG tablet Take 2 tablets (80 mg total) by mouth 2 (two) times daily.   gabapentin (NEURONTIN) 300 MG capsule Take 2 capsules (600 mg total) by mouth 2 (two) times daily.   hydroxychloroquine (PLAQUENIL) 200 MG tablet Take 400 mg by mouth 2 (two) times daily.    hydrOXYzine (ATARAX) 25 MG tablet Take 1 tablet (25 mg total) by mouth at bedtime as needed.   levothyroxine (SYNTHROID) 75 MCG tablet Take 1 tablet (75 mcg total) by mouth daily before breakfast.   montelukast (SINGULAIR) 10 MG tablet Take 1 tablet (10 mg total) by mouth at bedtime.   Multiple Vitamin (MULTI-VITAMINS) TABS Take  by mouth.   mupirocin cream (BACTROBAN) 2 % Apply to affected area on arms/legs twice a day as needed.   nystatin (MYCOSTATIN) 100000 UNIT/ML suspension Take 5 mLs (500,000 Units total) by mouth 4 (four) times daily.   pantoprazole (PROTONIX) 40 MG tablet Take 1 tablet (40 mg total) by mouth daily.   potassium chloride SA (KLOR-CON M) 20 MEQ tablet Take 1 tablet (20 mEq total) by mouth daily.   triamcinolone cream (KENALOG) 0.1 % Apply 1 application topically 2 (two) times daily.   venlafaxine XR (EFFEXOR-XR)  150 MG 24 hr capsule TAKE 1 CAPSULE DAILY WITH BREAKFAST with the 75mg  for 225mg  daily   venlafaxine XR (EFFEXOR-XR) 75 MG 24 hr capsule Take 1 capsule (75 mg total) by mouth daily with breakfast. To be taken with 150mg  for 225mg  total   No facility-administered encounter medications on file as of 07/27/2021.    Patient Active Problem List   Diagnosis Date Noted   Acquired thrombophilia ([place]) 01/12/2021   Pelvic pain 09/30/2017   Dyspareunia, female 09/15/2017   Vaginal atrophy 08/24/2017   Menopausal symptoms 08/24/2017   Varicose veins of both lower extremities with pain 11/09/2016   Mixed hyperlipidemia 08/20/2016   Advanced directives, counseling/discussion 08/20/2016   Depression    Anxiety    Benign hypertensive renal disease    Osteoporosis    Vitamin D deficiency    Fibromyalgia    History of pulmonary embolus ([facility])    Lupus (systemic lupus erythematosus) (HCC)    Chronic hypokalemia    Autoimmune hypothyroidism    Neuromuscular disorder (HCC)    OSA on CPAP    Diverticulosis    Chronic pain    Herpes labialis 02/04/2016   Angina pectoris ([place]) 08/06/2015   Continuous opioid dependence ([place]) 08/06/2015   Immunosuppression ([place]) 08/06/2015   Long term current use of anticoagulant therapy 08/06/2015   Morbid obesity ([place]) 08/06/2015   On corticosteroid therapy 08/06/2015   History of pneumonia 08/06/2015   Pulmonary hypertensive arterial disease ([place]) 08/06/2015    Conditions to be addressed/monitored: Anxiety and Depression  Care Plan : LCSW Plan of Care  Updates made by Rebekah Chesterfield, LCSW since 07/29/2021 12:00 AM     Problem: Coping Skills (General Plan of Care)      Long-Range Goal: Coping Skills Enhanced Completed 07/27/2021  Start Date: 02/02/2021  This Visit's Progress: On track  Recent Progress: On track  Priority: High  Note:   Current barriers:   Chronic Mental Health needs related to Anxiety and Depression Mental Health Concerns  and Family  and relationship dysfunction Needs Support, Education, and Care Coordination in order to meet unmet mental health needs. Clinical Goal(s): demonstrate a reduction in symptoms related to :Depression: anxiety   Clinical Interventions:  Assessed patient's previous and current treatment, coping skills, support system and barriers to care  Pt's last court hearing is scheduled for 07/29/21, where she will obtain guardianship of grandson. She has obtained an appt for screening for ADHD and Autism Patient reports improved management of MH symptoms with participation with medication management  Patient identified strong support system, including boyfriend. She receives SNAP benefits and has stable transportation CCM LCSW discussed self-care strategies to assist in promoting relaxation and a positive mood Active listening / Reflection utilized  Emotional Support Provided Caregiver stress acknowledged  Verbalization of feelings encouraged  ; Inter-disciplinary care team collaboration (see longitudinal plan of care) Patient Goals/Self-Care Activities: Over the next 120 days Attend scheduled appointments with providers Contact  PCP office with any questions or concerns Utilize healthy coping skills discussed Continue with your self-care action plan         Follow Up Plan:  No follow up required      Christa See, MSW, [email or web address] Phone [phone] 8:52 AM

## 2021-07-29 NOTE — Patient Instructions (Signed)
Visit Information  Thank you for taking time to visit with me today. Please don't hesitate to contact me if I can be of assistance to you before our next scheduled telephone appointment.  Following are the goals we discussed today:  Patient Goals/Self-Care Activities: Over the next 120 days Attend scheduled appointments with providers Contact PCP office with any questions or concerns Utilize healthy coping skills discussed Continue with your self-care action plan   If you are experiencing a Mental Health or Behavioral Health Crisis or need someone to talk to, please call the Suicide and Crisis Lifeline: 988 call 911   Patient verbalizes understanding of instructions and care plan provided today and agrees to view in MyChart. Active MyChart status and patient understanding of how to access instructions and care plan via MyChart confirmed with patient.     No follow up required. Pt completed all care plan goals  Jenel Lucks, MSW, LCSW [facility] [place]  [facility] [email or web address] Phone [phone] 8:53 AM

## 2021-08-05 DIAGNOSIS — F32A Depression, unspecified: Secondary | ICD-10-CM

## 2021-10-07 ENCOUNTER — Other Ambulatory Visit: Payer: Self-pay

## 2021-10-07 NOTE — Telephone Encounter (Signed)
Does the patient actually need refills? She needs to give the pharmacy her new insurance information when she is due for refills.

## 2021-10-07 NOTE — Telephone Encounter (Signed)
Patient now using [facility]

## 2021-10-07 NOTE — Telephone Encounter (Unsigned)
Copied from CRM [id number]. Topic: General - Other >> Oct 07, 2021  9:09 AM Macon Large wrote: Reason for CRM: Pt stated she has new insurance and [facility] sent [facility] last week requesting all new prescriptions for her medications. Cb# [phone]

## 2021-10-08 ENCOUNTER — Other Ambulatory Visit: Payer: Self-pay

## 2021-10-08 MED ORDER — POTASSIUM CHLORIDE CRYS ER 20 MEQ PO TBCR: 20.0000 meq | EXTENDED_RELEASE_TABLET | Freq: Every day | ORAL | 1 refills | Status: DC

## 2021-10-08 MED ORDER — TRIAMCINOLONE ACETONIDE 0.1 % EX CREA: 1.0000 | TOPICAL_CREAM | Freq: Two times a day (BID) | CUTANEOUS | 0 refills | Status: AC

## 2021-10-08 MED ORDER — BUPROPION HCL ER (XL) 150 MG PO TB24: 150.0000 mg | ORAL_TABLET | Freq: Every day | ORAL | 1 refills | Status: DC

## 2021-10-08 MED ORDER — LEVOTHYROXINE SODIUM 75 MCG PO TABS: 75.0000 ug | ORAL_TABLET | Freq: Every day | ORAL | 1 refills | Status: DC

## 2021-10-08 MED ORDER — CETIRIZINE HCL 10 MG PO TABS: 10.0000 mg | ORAL_TABLET | Freq: Every day | ORAL | 1 refills | Status: DC

## 2021-10-08 MED ORDER — FLUTICASONE PROPIONATE 50 MCG/ACT NA SUSP: NASAL | 6 refills | Status: DC

## 2021-10-08 MED ORDER — APIXABAN 5 MG PO TABS: 5.0000 mg | ORAL_TABLET | Freq: Two times a day (BID) | ORAL | 1 refills | Status: DC

## 2021-10-08 MED ORDER — BACLOFEN 10 MG PO TABS: 10.0000 mg | ORAL_TABLET | Freq: Three times a day (TID) | ORAL | 1 refills | Status: DC

## 2021-10-08 MED ORDER — VENLAFAXINE HCL ER 75 MG PO CP24: 75.0000 mg | ORAL_CAPSULE | Freq: Every day | ORAL | 1 refills | Status: DC

## 2021-10-08 MED ORDER — FUROSEMIDE 40 MG PO TABS: 80.0000 mg | ORAL_TABLET | Freq: Two times a day (BID) | ORAL | 1 refills | Status: DC

## 2021-10-08 MED ORDER — HYDROXYZINE HCL 25 MG PO TABS: 25.0000 mg | ORAL_TABLET | Freq: Every evening | ORAL | 1 refills | Status: DC | PRN

## 2021-10-08 MED ORDER — PANTOPRAZOLE SODIUM 40 MG PO TBEC
40.0000 mg | DELAYED_RELEASE_TABLET | Freq: Every day | ORAL | 1 refills | Status: DC
Start: 2021-10-08 — End: 2022-01-20

## 2021-10-08 MED ORDER — ALBUTEROL SULFATE HFA 108 (90 BASE) MCG/ACT IN AERS: 2.0000 | INHALATION_SPRAY | Freq: Four times a day (QID) | RESPIRATORY_TRACT | 6 refills | Status: DC | PRN

## 2021-10-08 MED ORDER — VENLAFAXINE HCL ER 150 MG PO CP24: ORAL_CAPSULE | ORAL | 1 refills | Status: DC

## 2021-10-08 NOTE — Telephone Encounter (Signed)
Called and spoke to patient. She states that she does need refills on her medications sent to [facility].

## 2021-11-03 ENCOUNTER — Other Ambulatory Visit (INDEPENDENT_AMBULATORY_CARE_PROVIDER_SITE_OTHER): Payer: Self-pay | Admitting: Nurse Practitioner

## 2021-11-03 DIAGNOSIS — I83813 Varicose veins of bilateral lower extremities with pain: Secondary | ICD-10-CM

## 2021-11-05 ENCOUNTER — Encounter (INDEPENDENT_AMBULATORY_CARE_PROVIDER_SITE_OTHER): Payer: Medicare HMO | Admitting: Nurse Practitioner

## 2021-11-05 ENCOUNTER — Encounter (INDEPENDENT_AMBULATORY_CARE_PROVIDER_SITE_OTHER): Payer: Medicare HMO

## 2021-12-01 ENCOUNTER — Ambulatory Visit: Payer: Medicare (Managed Care) | Admitting: Nurse Practitioner

## 2021-12-01 NOTE — Progress Notes (Deleted)
There were no vitals taken for this visit.   Subjective:    Patient ID: Kaylee Fox, female    DOB: [birth year], 61 y.o.   MRN: [id number]  HPI: Kaylee Fox is a 61 y.o. female  No chief complaint on file.  UPPER RESPIRATORY TRACT INFECTION Worst symptom: Fever: {Blank single:19197::"yes","no"} Cough: {Blank single:19197::"yes","no"} Shortness of breath: {Blank single:19197::"yes","no"} Wheezing: {Blank single:19197::"yes","no"} Chest pain: {Blank single:19197::"yes","no","yes, with cough"} Chest tightness: {Blank single:19197::"yes","no"} Chest congestion: {Blank single:19197::"yes","no"} Nasal congestion: {Blank single:19197::"yes","no"} Runny nose: {Blank single:19197::"yes","no"} Post nasal drip: {Blank single:19197::"yes","no"} Sneezing: {Blank single:19197::"yes","no"} Sore throat: {Blank single:19197::"yes","no"} Swollen glands: {Blank single:19197::"yes","no"} Sinus pressure: {Blank single:19197::"yes","no"} Headache: {Blank single:19197::"yes","no"} Face pain: {Blank single:19197::"yes","no"} Toothache: {Blank single:19197::"yes","no"} Ear pain: {Blank single:19197::"yes","no"} {Blank single:19197::""right","left", "bilateral"} Ear pressure: {Blank single:19197::"yes","no"} {Blank single:19197::""right","left", "bilateral"} Eyes red/itching:{Blank single:19197::"yes","no"} Eye drainage/crusting: {Blank single:19197::"yes","no"}  Vomiting: {Blank single:19197::"yes","no"} Rash: {Blank single:19197::"yes","no"} Fatigue: {Blank single:19197::"yes","no"} Sick contacts: {Blank single:19197::"yes","no"} Strep contacts: {Blank single:19197::"yes","no"}  Context: {Blank multiple:19196::"better","worse","stable","fluctuating"} Recurrent sinusitis: {Blank single:19197::"yes","no"} Relief with OTC cold/cough medications: {Blank single:19197::"yes","no"}  Treatments attempted: {Blank multiple:19196::"none","cold/sinus","mucinex","anti-histamine","pseudoephedrine","cough  syrup","antibiotics"}   Relevant past medical, surgical, family and social history reviewed and updated as indicated. Interim medical history since our last visit reviewed. Allergies and medications reviewed and updated.  Review of Systems  Per HPI unless specifically indicated above     Objective:    There were no vitals taken for this visit.  Wt Readings from Last 3 Encounters:  07/17/21 273 lb 12.8 oz (124.2 kg)  02/10/21 273 lb 12.8 oz (124.2 kg)  01/12/21 273 lb (123.8 kg)    Physical Exam  Results for orders placed or performed in visit on 07/17/21  Comprehensive metabolic panel  Result Value Ref Range   Glucose 84 70 - 99 mg/dL   BUN 12 6 - 24 mg/dL   Creatinine, Ser 0.84 0.57 - 1.00 mg/dL   eGFR 80 >59 mL/min/1.73   BUN/Creatinine Ratio 14 9 - 23   Sodium 144 134 - 144 mmol/L   Potassium 3.7 3.5 - 5.2 mmol/L   Chloride 105 96 - 106 mmol/L   CO2 27 20 - 29 mmol/L   Calcium 8.7 8.7 - 10.2 mg/dL   Total Protein 6.5 6.0 - 8.5 g/dL   Albumin 4.0 3.8 - 4.9 g/dL   Globulin, Total 2.5 1.5 - 4.5 g/dL   Albumin/Globulin Ratio 1.6 1.2 - 2.2   Bilirubin Total 0.4 0.0 - 1.2 mg/dL   Alkaline Phosphatase 114 44 - 121 IU/L   AST 28 0 - 40 IU/L   ALT 23 0 - 32 IU/L  CBC with Differential/Platelet  Result Value Ref Range   WBC 4.9 3.4 - 10.8 x10E3/uL   RBC 3.84 3.77 - 5.28 x10E6/uL   Hemoglobin 12.5 11.1 - 15.9 g/dL   Hematocrit 36.6 34.0 - 46.6 %   MCV 95 79 - 97 fL   MCH 32.6 26.6 - 33.0 pg   MCHC 34.2 31.5 - 35.7 g/dL   RDW 11.8 11.7 - 15.4 %   Platelets 240 150 - 450 x10E3/uL   Neutrophils 56 Not Estab. %   Lymphs 35 Not Estab. %   Monocytes 9 Not Estab. %   Eos 0 Not Estab. %   Basos 0 Not Estab. %   Neutrophils Absolute 2.7 1.4 - 7.0 x10E3/uL   Lymphocytes Absolute 1.7 0.7 - 3.1 x10E3/uL   Monocytes Absolute 0.5 0.1 - 0.9 x10E3/uL   EOS (ABSOLUTE) 0.0 0.0 - 0.4 x10E3/uL   Basophils Absolute 0.0 0.0 - 0.2 x10E3/uL  Immature Granulocytes 0 Not Estab. %    Immature Grans (Abs) 0.0 0.0 - 0.1 x10E3/uL  Lipid Panel w/o Chol/HDL Ratio  Result Value Ref Range   Cholesterol, Total 151 100 - 199 mg/dL   Triglycerides 80 0 - 149 mg/dL   HDL 52 >39 mg/dL   VLDL Cholesterol Cal 15 5 - 40 mg/dL   LDL Chol Calc (NIH) 84 0 - 99 mg/dL  TSH  Result Value Ref Range   TSH 2.060 0.450 - 4.500 uIU/mL  VITAMIN D 25 Hydroxy (Vit-D Deficiency, Fractures)  Result Value Ref Range   Vit D, 25-Hydroxy 28.5 (L) 30.0 - 100.0 ng/mL      Assessment & Plan:   Problem List Items Addressed This Visit   None    Follow up plan: No follow-ups on file.

## 2021-12-10 ENCOUNTER — Ambulatory Visit (INDEPENDENT_AMBULATORY_CARE_PROVIDER_SITE_OTHER): Payer: Medicare (Managed Care) | Admitting: *Deleted

## 2021-12-10 DIAGNOSIS — Z0000 Encounter for general adult medical examination without abnormal findings: Secondary | ICD-10-CM | POA: Diagnosis not present

## 2021-12-10 NOTE — Progress Notes (Signed)
Subjective:   Kaylee Fox is a 61 y.o. female who presents for Medicare Annual (Subsequent) preventive examination.  I connected with  Lavra Cerar on 12/10/21 by a v enabled telephone telemedicine application and verified that I am speaking with the correct person using two identifiers.   I discussed the limitations of evaluation and management by telemedicine. The patient expressed understanding and agreed to proceed.  Patient location: home  Provider location: Tele-health-home   Review of Systems     Cardiac Risk Factors include: advanced age (>42men, >72 women);hypertension;obesity (BMI >30kg/m2)     Objective:    Today's Vitals   There is no height or weight on file to calculate BMI.     12/10/2021   12:05 PM 11/29/2020   12:25 PM 11/26/2019    1:03 PM 11/02/2018   12:05 PM 08/22/2017    8:56 AM 11/09/2016    2:03 PM 08/20/2016    3:16 PM  Advanced Directives  Does Patient Have a Medical Advance Directive? No Yes Yes Yes No No No  Type of Corporate treasurer of [place];Living will [place];Living will Living will;Healthcare Power of Attorney     Does patient want to make changes to medical advance directive?  No - Patient declined       Copy of [place] in Chart?  No - copy requested No - copy requested No - copy requested     Would patient like information on creating a medical advance directive? No - Patient declined    Yes (MAU/Ambulatory/Procedural Areas - Information given)  Yes (MAU/Ambulatory/Procedural Areas - Information given)    Current Medications (verified) Outpatient Encounter Medications as of 12/10/2021  Medication Sig   acetaminophen (TYLENOL) 325 MG tablet Take by mouth as needed.    albuterol (VENTOLIN HFA) 108 (90 Base) MCG/ACT inhaler Inhale 2 puffs into the lungs every 6 (six) hours as needed for wheezing or shortness of breath.   amLODipine (NORVASC) 10 MG tablet Take 1 tablet (10 mg total)  by mouth daily.   apixaban (ELIQUIS) 5 MG TABS tablet Take 1 tablet (5 mg total) by mouth 2 (two) times daily.   atorvastatin (LIPITOR) 40 MG tablet Take 1 tablet (40 mg total) by mouth daily.   azaTHIOprine (IMURAN) 50 MG tablet Take 100 mg by mouth daily.   baclofen (LIORESAL) 10 MG tablet Take 1 tablet (10 mg total) by mouth 3 (three) times daily.   buPROPion (WELLBUTRIN XL) 150 MG 24 hr tablet Take 1 tablet (150 mg total) by mouth daily.   busPIRone (BUSPAR) 5 MG tablet Take 1 tablet (5 mg total) by mouth 2 (two) times daily.   calcium carbonate (OS-CAL) 600 MG TABS tablet Take by mouth.   cetirizine (ZYRTEC) 10 MG tablet Take 1 tablet (10 mg total) by mouth daily.   Cholecalciferol 25 MCG (1000 UT) capsule Take 1,000 Units by mouth daily. Takes 2 1000 mg per day   diclofenac Sodium (VOLTAREN) 1 % GEL Apply 4 g topically 4 (four) times daily.   fluticasone (FLONASE) 50 MCG/ACT nasal spray USE 2 SPRAYS INTO BOTH NOSTRILS DAILY.   furosemide (LASIX) 40 MG tablet Take 2 tablets (80 mg total) by mouth 2 (two) times daily.   gabapentin (NEURONTIN) 300 MG capsule Take 2 capsules (600 mg total) by mouth 2 (two) times daily.   hydroxychloroquine (PLAQUENIL) 200 MG tablet Take 400 mg by mouth 2 (two) times daily.    hydrOXYzine (ATARAX) 25 MG  tablet Take 1 tablet (25 mg total) by mouth at bedtime as needed.   levothyroxine (SYNTHROID) 75 MCG tablet Take 1 tablet (75 mcg total) by mouth daily before breakfast.   montelukast (SINGULAIR) 10 MG tablet Take 1 tablet (10 mg total) by mouth at bedtime.   Multiple Vitamin (MULTI-VITAMINS) TABS Take by mouth.   mupirocin cream (BACTROBAN) 2 % Apply to affected area on arms/legs twice a day as needed.   nystatin (MYCOSTATIN) 100000 UNIT/ML suspension Take 5 mLs (500,000 Units total) by mouth 4 (four) times daily.   pantoprazole (PROTONIX) 40 MG tablet Take 1 tablet (40 mg total) by mouth daily.   potassium chloride SA (KLOR-CON M) 20 MEQ tablet Take 1 tablet  (20 mEq total) by mouth daily.   triamcinolone cream (KENALOG) 0.1 % Apply 1 Application topically 2 (two) times daily.   venlafaxine XR (EFFEXOR-XR) 150 MG 24 hr capsule TAKE 1 CAPSULE DAILY WITH BREAKFAST with the 75mg  for 225mg  daily   venlafaxine XR (EFFEXOR-XR) 75 MG 24 hr capsule Take 1 capsule (75 mg total) by mouth daily with breakfast. To be taken with 150mg  for 225mg  total   No facility-administered encounter medications on file as of 12/10/2021.    Allergies (verified) Penicillins, Clindamycin, Ciprofloxacin, and Morphine   History: Past Medical History:  Diagnosis Date   Anticoagulant long-term use    Anxiety    Autoimmune hypothyroidism    Chronic hypokalemia    Chronic pain    Chronic, continuous use of opioids    Depression    Diverticulosis    Fatigue    Fibromyalgia    Herpes labialis    Herpes simplex    History of pulmonary embolus (PE)    History of pulmonary hypertension    Hypertension    Lupus (systemic lupus erythematosus) (HCC)    Neuromuscular disorder (HCC)    OSA on CPAP    Osteoporosis    Venous ulcers of both lower extremities ([place]) 06/17/2016   Vitamin D deficiency    Past Surgical History:  Procedure Laterality Date   CESAREAN SECTION     CHOLECYSTECTOMY     COLONOSCOPY WITH PROPOFOL N/A 07/16/2016   Procedure: COLONOSCOPY WITH PROPOFOL;  Surgeon: Jonathon Bellows, MD;  Location: [facility] END[facility]COPY;  Service: Endoscopy;  Laterality: N/A;   LAPAROSCOPIC GASTRIC SLEEVE RESECTION     Family History  Problem Relation Age of Onset   Arthritis Mother    Osteoarthritis Mother    Lupus Mother    Hypothyroidism Mother    Osteoarthritis Father    Emphysema Father    Coronary artery disease Father    Social History   Socioeconomic History   Marital status: Legally Separated    Spouse name: Not on file   Number of children: 2   Years of education: 12   Highest education level: High school graduate  Occupational History   Occupation: disability    Tobacco Use   Smoking status: Never   Smokeless tobacco: Never  Vaping Use   Vaping Use: Never used  Substance and Sexual Activity   Alcohol use: No   Drug use: No   Sexual activity: Yes    Birth control/protection: Post-menopausal  Other Topics Concern   Not on file  Social History Narrative   Not on file   Social Determinants of Health   Financial Resource Strain: Low Risk  (12/10/2021)   Overall Financial Resource Strain (CARDIA)    Difficulty of Paying Living Expenses: Not hard at all  Food  Insecurity: No Food Insecurity (12/10/2021)   Hunger Vital Sign    Worried About Running Out of Food in the Last Year: Never true    Ran Out of Food in the Last Year: Never true  Transportation Needs: No Transportation Needs (12/10/2021)   PRAPARE - Hydrologist (Medical): No    Lack of Transportation (Non-Medical): No  Physical Activity: Insufficiently Active (12/10/2021)   Exercise Vital Sign    Days of Exercise per Week: 3 days    Minutes of Exercise per Session: 30 min  Stress: Stress Concern Present (12/10/2021)   [place]    Feeling of Stress : Rather much  Social Connections: Socially Isolated (12/10/2021)   Social Connection and Isolation Panel [NHANES]    Frequency of Communication with Friends and Family: More than three times a week    Frequency of Social Gatherings with Friends and Family: Never    Attends Religious Services: Never    Marine scientist or Organizations: No    Attends Music therapist: Never    Marital Status: Separated    Tobacco Counseling Counseling given: Not Answered   Clinical Intake:  Pre-visit preparation completed: Yes  Pain : No/denies pain     Diabetes: No  How often do you need to have someone help you when you read instructions, pamphlets, or other written materials from your doctor or pharmacy?: 1 - Never  Diabetic?   no  Interpreter Needed?: No  Information entered by :: Leroy Kennedy LPN   Activities of Daily Living    12/10/2021   12:19 PM  In your present state of health, do you have any difficulty performing the following activities:  Hearing? 1  Vision? 0  Difficulty concentrating or making decisions? 0  Walking or climbing stairs? 1  Dressing or bathing? 0  Doing errands, shopping? 0  Preparing Food and eating ? N  Using the Toilet? N  In the past six months, have you accidently leaked urine? N  Do you have problems with loss of bowel control? N  Managing your Medications? N  Managing your Finances? N  Housekeeping or managing your Housekeeping? N    Patient Care Team: Valerie Roys, DO as PCP - General (Family Medicine) Lucky Cowboy Erskine Squibb, MD as Referring Physician (Vascular Surgery) Diona Browner, MD as Referring Physician (Surgery) Elnora Morrison, MD as Referring Physician (Rheumatology)  Indicate any recent Medical Services you may have received from other than [facility] providers in the past year (date may be approximate).     Assessment:   This is a routine wellness examination for Kaylee Fox.  Hearing/Vision screen Hearing Screening - Comments:: Yes trouble hearing No hearing aids Vision Screening - Comments:: On Plaquenil L[place] docotor  [place]  Dietary issues and exercise activities discussed: Current Exercise Habits: Home exercise routine, Type of exercise: walking, Time (Minutes): 30, Frequency (Times/Week): 3, Weekly Exercise (Minutes/Week): 90, Intensity: Mild   Goals Addressed             This Visit's Progress    DIET - INCREASE WATER INTAKE   Not on track    Recommend continue drinking at least 6-8 glasses of water a day      Weight (lb) < 200 lb (90.7 kg)         Depression Screen    12/10/2021   12:12 PM 07/17/2021    3:26 PM 02/10/2021    3:42 PM 01/12/2021  2:12 PM 11/29/2020   12:11 PM 07/10/2020    3:43 PM 05/29/2020    2:56 PM  PHQ 2/9 Scores  PHQ - 2  Score 3 2 0 3 3 4 5   PHQ- 9 Score 10 12 5 10 6 13 13     Fall Risk    12/10/2021   12:04 PM 11/29/2020   12:19 PM 01/01/2020    3:01 PM 12/04/2019    2:52 PM 11/26/2019    1:04 PM  Fall Risk   Falls in the past year? 0 0 0 0 1  Comment     lost balance trying to turn fast  Number falls in past yr: 0 0 0 1 1  Injury with Fall? 0 0 0 0 0  Risk for fall due to :  History of fall(s) No Fall Risks Impaired balance/gait Impaired balance/gait;Medication side effect  Follow up Falls evaluation completed;Education provided;Falls prevention discussed Falls evaluation completed Falls evaluation completed Falls evaluation completed;Education provided Falls evaluation completed;Education provided;Falls prevention discussed    FALL RISK PREVENTION PERTAINING TO THE HOME:  Any stairs in or around the home? No  If so, are there any without handrails? No  Home free of loose throw rugs in walkways, pet beds, electrical cords, etc? Yes  Adequate lighting in your home to reduce risk of falls? Yes   ASSISTIVE DEVICES UTILIZED TO PREVENT FALLS:  Life alert? No  Use of a cane, walker or w/c? No  Grab bars in the bathroom? No  Shower chair or bench in shower? No  Elevated toilet seat or a handicapped toilet? No   TIMED UP AND GO:  Was the test performed? No .    Cognitive Function:        12/10/2021   12:06 PM 11/29/2020   12:18 PM 11/26/2019    1:10 PM 08/22/2017    8:59 AM 08/20/2016    3:09 PM  6CIT Screen  What Year? 0 points 0 points 0 points 0 points 0 points  What month? 0 points 0 points 0 points 0 points 0 points  What time? 0 points 0 points 0 points 0 points 0 points  Count back from 20 0 points 0 points 0 points 0 points 0 points  Months in reverse 0 points 0 points 0 points 0 points 0 points  Repeat phrase 0 points 0 points 0 points 0 points 4 points  Total Score 0 points 0 points 0 points 0 points 4 points    Immunizations Immunization History  Administered Date(s)  Administered   Influenza Inj Mdck Quad Pf 12/13/2017   Influenza,inj,Quad PF,6+ Mos 12/10/2016, 11/09/2018, 12/04/2019, 12/08/2020   Influenza-Unspecified 12/18/2013, 12/10/2016   PFIZER(Purple Top)SARS-COV-2 Vaccination 06/16/2019, 07/11/2019   Pneumococcal Conjugate-13 05/04/2017   Pneumococcal Polysaccharide-23 02/24/2010   Tdap 08/22/2015    TDAP status: Up to date  Flu Vaccine status: Due, Education has been provided regarding the importance of this vaccine. Advised may receive this vaccine at local pharmacy or Health Dept. Aware to provide a copy of the vaccination record if obtained from local pharmacy or Health Dept. Verbalized acceptance and understanding.  Pneumococcal vaccine status: Up to date  Covid-19 vaccine status: Information provided on how to obtain vaccines.   Qualifies for Shingles Vaccine? Yes   Zostavax completed No   Shingrix Completed?: No.    Education has been provided regarding the importance of this vaccine. Patient has been advised to call insurance company to determine out of pocket expense if they have not  yet received this vaccine. Advised may also receive vaccine at local pharmacy or Health Dept. Verbalized acceptance and understanding.  Screening Tests Health Maintenance  Topic Date Due   MAMMOGRAM  Never done   PAP SMEAR-Modifier  08/20/2021   INFLUENZA VACCINE  10/06/2021   COVID-19 Vaccine (3 - Pfizer risk series) 12/26/2021 (Originally 08/08/2019)   Zoster Vaccines- Shingrix (1 of 2) 03/12/2022 (Originally [birth year]/1980)   COLONOSCOPY (Pts 45-72yrs Insurance coverage will need to be confirmed)  12/11/2022 (Originally 07/16/2021)   TETANUS/TDAP  08/21/2025   Hepatitis C Screening  Completed   HIV Screening  Completed   HPV VACCINES  Aged Out    Health Maintenance  Health Maintenance Due  Topic Date Due   MAMMOGRAM  Never done   PAP SMEAR-Modifier  08/20/2021   INFLUENZA VACCINE  10/06/2021    Colorectal cancer screening: Type of screening:  Colonoscopy. Completed 2018. Repeat every 5 years    Patient did not want to pur order in postponed for 1 yr  Mammogram status: Ordered  . Pt provided with contact info and advised to call to schedule appt.     Lung Cancer Screening: (Low Dose CT Chest recommended if Age 7-80 years, 30 pack-year currently smoking OR have quit w/in 15years.) does not qualify.   Lung Cancer Screening Referral:   Additional Screening:  Hepatitis C Screening: does not qualify; Completed 2029  Vision Screening: Recommended annual ophthalmology exams for early detection of glaucoma and other disorders of the eye. Is the patient up to date with their annual eye exam?  Yes  Who is the provider or what is the name of the office in which the patient attends annual eye exams? Lupus doctor in [place] If pt is not established with a provider, would they like to be referred to a provider to establish care? No .   Dental Screening: Recommended annual dental exams for proper oral hygiene  Community Resource Referral / Chronic Care Management: CRR required this visit?  No   CCM required this visit?  No      Plan:     I have personally reviewed and noted the following in the patient's chart:   Medical and social history Use of alcohol, tobacco or illicit drugs  Current medications and supplements including opioid prescriptions. Patient is not currently taking opioid prescriptions. Functional ability and status Nutritional status Physical activity Advanced directives List of other physicians Hospitalizations, surgeries, and ER visits in previous 12 months Vitals Screenings to include cognitive, depression, and falls Referrals and appointments  In addition, I have reviewed and discussed with patient certain preventive protocols, quality metrics, and best practice recommendations. A written personalized care plan for preventive services as well as general preventive health recommendations were provided to  patient.     Leroy Kennedy, LPN   [id number]   Nurse Notes:

## 2021-12-10 NOTE — Patient Instructions (Signed)
Kaylee Fox , Thank you for taking time to come for your Medicare Wellness Visit. I appreciate your ongoing commitment to your health goals. Please review the following plan we discussed and let me know if I can assist you in the future.   Screening recommendations/referrals: Colonoscopy: Education provided Mammogram: Education provided Recommended yearly ophthalmology/optometry visit for glaucoma screening and checkup Recommended yearly dental visit for hygiene and checkup  Vaccinations: Influenza vaccine: Education provided Pneumococcal vaccine:  Tdap vaccine: up to date Shingles vaccine: Education provided    Advanced directives: Education provided    Next appointment: 01-18-2022 @ 2:40  [facility] 51 Years and Older, Female Preventive care refers to lifestyle choices and visits with your health care provider that can promote health and wellness. What does preventive care include? A yearly physical exam. This is also called an annual well check. Dental exams once or twice a year. Routine eye exams. Ask your health care provider how often you should have your eyes checked. Personal lifestyle choices, including: Daily care of your teeth and gums. Regular physical activity. Eating a healthy diet. Avoiding tobacco and drug use. Limiting alcohol use. Practicing safe sex. Taking low-dose aspirin every day. Taking vitamin and mineral supplements as recommended by your health care provider. What happens during an annual well check? The services and screenings done by your health care provider during your annual well check will depend on your age, overall health, lifestyle risk factors, and family history of disease. Counseling  Your health care provider may ask you questions about your: Alcohol use. Tobacco use. Drug use. Emotional well-being. Home and relationship well-being. Sexual activity. Eating habits. History of falls. Memory and ability to understand  (cognition). Work and work Statistician. Reproductive health. Screening  You may have the following tests or measurements: Height, weight, and BMI. Blood pressure. Lipid and cholesterol levels. These may be checked every 5 years, or more frequently if you are over 80 years old. Skin check. Lung cancer screening. You may have this screening every year starting at age 3 if you have a 30-pack-year history of smoking and currently smoke or have quit within the past 15 years. Fecal occult blood test (FOBT) of the stool. You may have this test every year starting at age 2. Flexible sigmoidoscopy or colonoscopy. You may have a sigmoidoscopy every 5 years or a colonoscopy every 10 years starting at age 34. Hepatitis C blood test. Hepatitis B blood test. Sexually transmitted disease (STD) testing. Diabetes screening. This is done by checking your blood sugar (glucose) after you have not eaten for a while (fasting). You may have this done every 1-3 years. Bone density scan. This is done to screen for osteoporosis. You may have this done starting at age 14. Mammogram. This may be done every 1-2 years. Talk to your health care provider about how often you should have regular mammograms. Talk with your health care provider about your test results, treatment options, and if necessary, the need for more tests. Vaccines  Your health care provider may recommend certain vaccines, such as: Influenza vaccine. This is recommended every year. Tetanus, diphtheria, and acellular pertussis (Tdap, Td) vaccine. You may need a Td booster every 10 years. Zoster vaccine. You may need this after age 33. Pneumococcal 13-valent conjugate (PCV13) vaccine. One dose is recommended after age 58. Pneumococcal polysaccharide (PPSV23) vaccine. One dose is recommended after age 32. Talk to your health care provider about which screenings and vaccines you need and how often you  need them. This information is not intended to  replace advice given to you by your health care provider. Make sure you discuss any questions you have with your health care provider. Document Released: 03/21/2015 Document Revised: 11/12/2015 Document Reviewed: 12/24/2014 Elsevier Interactive Patient Education  2017 [place] Prevention in the Home Falls can cause injuries. They can happen to people of all ages. There are many things you can do to make your home safe and to help prevent falls. What can I do on the outside of my home? Regularly fix the edges of walkways and driveways and fix any cracks. Remove anything that might make you trip as you walk through a door, such as a raised step or threshold. Trim any bushes or trees on the path to your home. Use bright outdoor lighting. Clear any walking paths of anything that might make someone trip, such as rocks or tools. Regularly check to see if handrails are loose or broken. Make sure that both sides of any steps have handrails. Any raised decks and porches should have guardrails on the edges. Have any leaves, snow, or ice cleared regularly. Use sand or salt on walking paths during winter. Clean up any spills in your garage right away. This includes oil or grease spills. What can I do in the bathroom? Use night lights. Install grab bars by the toilet and in the tub and shower. Do not use towel bars as grab bars. Use non-skid mats or decals in the tub or shower. If you need to sit down in the shower, use a plastic, non-slip stool. Keep the floor dry. Clean up any water that spills on the floor as soon as it happens. Remove soap buildup in the tub or shower regularly. Attach bath mats securely with double-sided non-slip rug tape. Do not have throw rugs and other things on the floor that can make you trip. What can I do in the bedroom? Use night lights. Make sure that you have a light by your bed that is easy to reach. Do not use any sheets or blankets that are too big for  your bed. They should not hang down onto the floor. Have a firm chair that has side arms. You can use this for support while you get dressed. Do not have throw rugs and other things on the floor that can make you trip. What can I do in the kitchen? Clean up any spills right away. Avoid walking on wet floors. Keep items that you use a lot in easy-to-reach places. If you need to reach something above you, use a strong step stool that has a grab bar. Keep electrical cords out of the way. Do not use floor polish or wax that makes floors slippery. If you must use wax, use non-skid floor wax. Do not have throw rugs and other things on the floor that can make you trip. What can I do with my stairs? Do not leave any items on the stairs. Make sure that there are handrails on both sides of the stairs and use them. Fix handrails that are broken or loose. Make sure that handrails are as long as the stairways. Check any carpeting to make sure that it is firmly attached to the stairs. Fix any carpet that is loose or worn. Avoid having throw rugs at the top or bottom of the stairs. If you do have throw rugs, attach them to the floor with carpet tape. Make sure that you have a light switch  at the top of the stairs and the bottom of the stairs. If you do not have them, ask someone to add them for you. What else can I do to help prevent falls? Wear shoes that: Do not have high heels. Have rubber bottoms. Are comfortable and fit you well. Are closed at the toe. Do not wear sandals. If you use a stepladder: Make sure that it is fully opened. Do not climb a closed stepladder. Make sure that both sides of the stepladder are locked into place. Ask someone to hold it for you, if possible. Clearly mark and make sure that you can see: Any grab bars or handrails. First and last steps. Where the edge of each step is. Use tools that help you move around (mobility aids) if they are needed. These  include: Canes. Walkers. Scooters. Crutches. Turn on the lights when you go into a dark area. Replace any light bulbs as soon as they burn out. Set up your furniture so you have a clear path. Avoid moving your furniture around. If any of your floors are uneven, fix them. If there are any pets around you, be aware of where they are. Review your medicines with your doctor. Some medicines can make you feel dizzy. This can increase your chance of falling. Ask your doctor what other things that you can do to help prevent falls. This information is not intended to replace advice given to you by your health care provider. Make sure you discuss any questions you have with your health care provider. Document Released: 12/19/2008 Document Revised: 07/31/2015 Document Reviewed: 03/29/2014 Elsevier Interactive Patient Education  2017 [employer].

## 2022-01-04 ENCOUNTER — Encounter (INDEPENDENT_AMBULATORY_CARE_PROVIDER_SITE_OTHER): Payer: Self-pay

## 2022-01-15 ENCOUNTER — Encounter: Payer: Self-pay | Admitting: Family Medicine

## 2022-01-15 ENCOUNTER — Ambulatory Visit (INDEPENDENT_AMBULATORY_CARE_PROVIDER_SITE_OTHER): Payer: Medicare (Managed Care) | Admitting: Family Medicine

## 2022-01-15 ENCOUNTER — Other Ambulatory Visit: Payer: Self-pay | Admitting: Family Medicine

## 2022-01-15 VITALS — BP 142/70 | HR 79 | Temp 99.1°F | Wt 268.5 lb

## 2022-01-15 DIAGNOSIS — R051 Acute cough: Secondary | ICD-10-CM | POA: Diagnosis not present

## 2022-01-15 MED ORDER — BENZONATATE 200 MG PO CAPS: 200.0000 mg | ORAL_CAPSULE | Freq: Two times a day (BID) | ORAL | 0 refills | Status: DC | PRN

## 2022-01-15 MED ORDER — PREDNISONE 50 MG PO TABS: 50.0000 mg | ORAL_TABLET | Freq: Every day | ORAL | 0 refills | Status: DC

## 2022-01-15 MED ORDER — ATORVASTATIN CALCIUM 40 MG PO TABS: 40.0000 mg | ORAL_TABLET | Freq: Every day | ORAL | 0 refills | Status: DC

## 2022-01-15 MED ORDER — GABAPENTIN 300 MG PO CAPS: 600.0000 mg | ORAL_CAPSULE | Freq: Two times a day (BID) | ORAL | 0 refills | Status: DC

## 2022-01-15 MED ORDER — MONTELUKAST SODIUM 10 MG PO TABS: 10.0000 mg | ORAL_TABLET | Freq: Every day | ORAL | 0 refills | Status: DC

## 2022-01-15 MED ORDER — AMLODIPINE BESYLATE 10 MG PO TABS: 10.0000 mg | ORAL_TABLET | Freq: Every day | ORAL | 0 refills | Status: DC

## 2022-01-15 NOTE — Telephone Encounter (Signed)
Requested by interface [email or web address] signed today .  Requested Prescriptions  Refused Prescriptions Disp Refills   montelukast (SINGULAIR) 10 MG tablet [Pharmacy Med Name: MONTELUKAST 10MG  TABLETS] 90 tablet     Sig: TAKE 1 TABLET(10 MG) BY MOUTH AT BEDTIME     Pulmonology:  Leukotriene Inhibitors Passed - 01/15/2022  4:01 PM      Passed - Valid encounter within last 12 months    Recent Outpatient Visits           Today Acute cough   [facility] [place], Megan P, DO   6 months ago Varicose veins of both lower extremities with pain   [place], Megan P, DO   11 months ago Plantar fasciitis of right foot   [place], Megan P, DO   1 year ago Routine general medical examination at a health care facility   [place], [place], DO   1 year ago [place], NP       Future Appointments             In 5 days Wynetta Emery, Megan P, DO [place], [facility]             amLODipine ([place]) 10 MG tablet [Pharmacy Med Name: AMLODIPINE BESYLATE 10MG  TABLETS] 90 tablet     Sig: TAKE 1 TABLET(10 MG) BY MOUTH DAILY     Cardiovascular: Calcium Channel Blockers 2 Failed - 01/15/2022  4:01 PM      Failed - Last BP in normal range    BP Readings from Last 1 Encounters:  01/15/22 (!) 142/70         Passed - Last Heart Rate in normal range    Pulse Readings from Last 1 Encounters:  01/15/22 79         Passed - Valid encounter within last 6 months    Recent Outpatient Visits           Today Acute cough   [facility] [place], Megan P, DO   6 months ago Varicose veins of both lower extremities with pain   [place], Megan P, DO   11 months ago Plantar fasciitis of right foot   [place], Megan P, DO   1 year ago Routine general medical examination at a health care facility   [place], [place], DO   1 year ago [place], NP       Future Appointments             In 5 days Wynetta Emery, Barb Merino, DO [place], [facility]             atorvastatin (LIPITOR) 40 MG tablet [Pharmacy Med Name: ATORVASTATIN 40MG  TABLETS] 90 tablet     Sig: TAKE 1 TABLET(40 MG) BY MOUTH DAILY     Cardiovascular:  Antilipid - Statins Failed - 01/15/2022  4:01 PM      Failed - Lipid Panel in normal range within the last 12 months    Cholesterol, Total  Date Value Ref Range Status  07/17/2021 151 100 - 199 mg/dL Final   LDL Chol Calc (NIH)  Date Value Ref Range Status  07/17/2021 84 0 - 99 mg/dL Final   HDL  Date Value Ref Range Status  07/17/2021 52 >39 mg/dL Final   Triglycerides  Date Value Ref Range Status  07/17/2021 80 0 - 149 mg/dL Final         Passed - Patient is not pregnant      Passed - Valid encounter within last 12 months    Recent Outpatient Visits           Today Acute cough   [facility] [place], Megan P, DO   6 months ago Varicose veins of both lower extremities with pain   [facility], Megan P, DO   11 months ago Plantar fasciitis of right foot   [facility] [place], Megan P, DO   1 year ago Routine general medical examination at a health care facility   [facility] [place], [place], DO   1 year ago Anxiety   [facility] Larae Grooms, NP       Future Appointments             In 5 days Laural Benes, Oralia Rud, DO [facility], [facility]

## 2022-01-15 NOTE — Progress Notes (Signed)
BP (!) 142/70 (BP Location: Left Arm, Cuff Size: Normal)   Pulse 79   Temp 99.1 F (37.3 C) (Oral)   Wt 268 lb 8 oz (121.8 kg)   SpO2 99%   BMI 47.56 kg/m    Subjective:    Patient ID: Kaylee Fox, female    DOB: 04-Dec-1960, 61 y.o.   MRN: [id number]  HPI: Kaylee Fox is a 61 y.o. female  Chief Complaint  Patient presents with   Cough   Congestion   Headache   Generalized Body Aches   Fatigue    Patient says she became symptomatic Sunday and says she everyone in her household was sick last week. Patient says she has a autoimmune disorder and wanted to be seen by provider. Patient says she has tried Mucinex and NyQuil.    UPPER RESPIRATORY TRACT INFECTION Duration: 5 days Worst symptom: cough and congestion Fever: no Cough: yes Shortness of breath: yes Wheezing: no Chest pain: no Chest tightness: no Chest congestion: yes Nasal congestion: yes Runny nose: yes Post nasal drip: yes Sneezing: no Sore throat: no Swollen glands: no Sinus pressure: no Headache: yes Face pain: no Toothache: no Ear pain: no  Ear pressure: yes "right Eyes red/itching:yes Eye drainage/crusting: yes  Vomiting: no Rash: no Fatigue: yes Sick contacts: yes Strep contacts: no  Context: stable Recurrent sinusitis: no Relief with OTC cold/cough medications: no  Treatments attempted: nyquil and mucinex   Relevant past medical, surgical, family and social history reviewed and updated as indicated. Interim medical history since our last visit reviewed. Allergies and medications reviewed and updated.  Review of Systems  Constitutional: Negative.   HENT:  Positive for congestion, postnasal drip, sinus pressure, sneezing and sore throat. Negative for dental problem, drooling, ear discharge, ear pain, facial swelling, hearing loss, mouth sores, nosebleeds, rhinorrhea, sinus pain, tinnitus, trouble swallowing and voice change.   Respiratory:  Positive for cough, chest tightness, shortness  of breath and wheezing. Negative for apnea, choking and stridor.   Cardiovascular: Negative.   Gastrointestinal: Negative.   Musculoskeletal: Negative.   Psychiatric/Behavioral: Negative.      Per HPI unless specifically indicated above     Objective:    BP (!) 142/70 (BP Location: Left Arm, Cuff Size: Normal)   Pulse 79   Temp 99.1 F (37.3 C) (Oral)   Wt 268 lb 8 oz (121.8 kg)   SpO2 99%   BMI 47.56 kg/m   Wt Readings from Last 3 Encounters:  01/15/22 268 lb 8 oz (121.8 kg)  07/17/21 273 lb 12.8 oz (124.2 kg)  02/10/21 273 lb 12.8 oz (124.2 kg)    Physical Exam Vitals and nursing note reviewed.  Constitutional:      General: She is not in acute distress.    Appearance: Normal appearance. She is well-developed. She is obese.  HENT:     Head: Normocephalic and atraumatic.     Right Ear: Hearing, tympanic membrane, ear canal and external ear normal.     Left Ear: Hearing, tympanic membrane, ear canal and external ear normal.     Nose: Mucosal edema, congestion and rhinorrhea present. No nasal tenderness.     Right Turbinates: Enlarged.     Left Turbinates: Enlarged.     Mouth/Throat:     Mouth: Mucous membranes are moist.     Pharynx: Oropharynx is clear.     Tonsils: No tonsillar exudate.  Eyes:     General: Lids are normal. No scleral icterus.  Right eye: No discharge.        Left eye: No discharge.     Extraocular Movements: Extraocular movements intact.     Conjunctiva/sclera: Conjunctivae normal.     Pupils: Pupils are equal, round, and reactive to light.  Neck:     Vascular: No carotid bruit.  Cardiovascular:     Rate and Rhythm: Normal rate and regular rhythm.     Pulses: Normal pulses.     Heart sounds: Normal heart sounds. No murmur heard.    No friction rub. No gallop.  Pulmonary:     Effort: Pulmonary effort is normal. No respiratory distress.     Breath sounds: Normal breath sounds. No stridor. No wheezing, rhonchi or rales.  Chest:     Chest  wall: No tenderness.  Musculoskeletal:        General: Normal range of motion.     Cervical back: Normal range of motion and neck supple. No rigidity or tenderness.  Lymphadenopathy:     Cervical: Cervical adenopathy present.  Skin:    Coloration: Skin is not jaundiced or pale.     Findings: No bruising, erythema, lesion or rash.  Neurological:     General: No focal deficit present.     Mental Status: She is alert and oriented to person, place, and time. Mental status is at baseline.  Psychiatric:        Mood and Affect: Mood normal.        Speech: Speech normal.        Behavior: Behavior normal.        Thought Content: Thought content normal.        Judgment: Judgment normal.     Results for orders placed or performed in visit on 01/15/22  Rapid Strep Screen ([facility] ONLY)   Specimen: Other   Other  Result Value Ref Range   Strep Gp A Ag, IA W/Reflex Negative Negative  Culture, Group A Strep   Other  Result Value Ref Range   Strep A Culture WILL FOLLOW   Veritor Flu A/B Waived  Result Value Ref Range   Influenza A Negative Negative   Influenza B Negative Negative      Assessment & Plan:   Problem List Items Addressed This Visit   None Visit Diagnoses     Acute cough    -  Primary   Flu and strep negative. Await covid. Treat symptomatically with prednisone and tessalon. Follow up as scheduled for physical.   Relevant Orders   Veritor Flu A/B Waived (Completed)   Rapid Strep Screen ([facility] ONLY) (Completed)   Novel Coronavirus, NAA ([facility])        Follow up plan: Return as scheduled for physical.

## 2022-01-16 LAB — NOVEL CORONAVIRUS, NAA: SARS-CoV-2, NAA: NOT DETECTED

## 2022-01-18 ENCOUNTER — Ambulatory Visit: Payer: Medicare Other | Admitting: Family Medicine

## 2022-01-18 LAB — VERITOR FLU A/B WAIVED
Influenza A: NEGATIVE
Influenza B: NEGATIVE

## 2022-01-18 LAB — RAPID STREP SCREEN (MED CTR MEBANE ONLY): Strep Gp A Ag, IA W/Reflex: NEGATIVE

## 2022-01-18 LAB — CULTURE, GROUP A STREP: Strep A Culture: NEGATIVE

## 2022-01-20 ENCOUNTER — Encounter: Payer: Self-pay | Admitting: Family Medicine

## 2022-01-20 ENCOUNTER — Ambulatory Visit (INDEPENDENT_AMBULATORY_CARE_PROVIDER_SITE_OTHER): Payer: Medicare (Managed Care) | Admitting: Family Medicine

## 2022-01-20 VITALS — BP 121/77 | HR 70 | Temp 98.1°F | Ht 62.75 in | Wt 268.7 lb

## 2022-01-20 DIAGNOSIS — E782 Mixed hyperlipidemia: Secondary | ICD-10-CM

## 2022-01-20 DIAGNOSIS — E559 Vitamin D deficiency, unspecified: Secondary | ICD-10-CM | POA: Diagnosis not present

## 2022-01-20 DIAGNOSIS — I129 Hypertensive chronic kidney disease with stage 1 through stage 4 chronic kidney disease, or unspecified chronic kidney disease: Secondary | ICD-10-CM

## 2022-01-20 DIAGNOSIS — E876 Hypokalemia: Secondary | ICD-10-CM | POA: Diagnosis not present

## 2022-01-20 DIAGNOSIS — E063 Autoimmune thyroiditis: Secondary | ICD-10-CM | POA: Diagnosis not present

## 2022-01-20 DIAGNOSIS — Z1211 Encounter for screening for malignant neoplasm of colon: Secondary | ICD-10-CM

## 2022-01-20 DIAGNOSIS — F419 Anxiety disorder, unspecified: Secondary | ICD-10-CM

## 2022-01-20 DIAGNOSIS — Z0000 Encounter for general adult medical examination without abnormal findings: Secondary | ICD-10-CM | POA: Diagnosis not present

## 2022-01-20 DIAGNOSIS — Z23 Encounter for immunization: Secondary | ICD-10-CM | POA: Diagnosis not present

## 2022-01-20 DIAGNOSIS — F331 Major depressive disorder, recurrent, moderate: Secondary | ICD-10-CM | POA: Diagnosis not present

## 2022-01-20 DIAGNOSIS — Z1231 Encounter for screening mammogram for malignant neoplasm of breast: Secondary | ICD-10-CM | POA: Diagnosis not present

## 2022-01-20 LAB — URINALYSIS, ROUTINE W REFLEX MICROSCOPIC
Bilirubin, UA: NEGATIVE
Glucose, UA: NEGATIVE
Ketones, UA: NEGATIVE
Nitrite, UA: NEGATIVE
RBC, UA: NEGATIVE
Specific Gravity, UA: 1.02 (ref 1.005–1.030)
Urobilinogen, Ur: 1 mg/dL (ref 0.2–1.0)
pH, UA: 7 (ref 5.0–7.5)

## 2022-01-20 LAB — MICROSCOPIC EXAMINATION
Bacteria, UA: NONE SEEN
RBC, Urine: NONE SEEN /hpf (ref 0–2)

## 2022-01-20 LAB — MICROALBUMIN, URINE WAIVED
Creatinine, Urine Waived: 100 mg/dL (ref 10–300)
Microalb, Ur Waived: 30 mg/L — ABNORMAL HIGH (ref 0–19)
Microalb/Creat Ratio: <30 mg/g (ref <30)

## 2022-01-20 MED ORDER — ATORVASTATIN CALCIUM 40 MG PO TABS: 40.0000 mg | ORAL_TABLET | Freq: Every day | ORAL | 1 refills | Status: DC

## 2022-01-20 MED ORDER — BUPROPION HCL ER (XL) 150 MG PO TB24: 150.0000 mg | ORAL_TABLET | Freq: Every day | ORAL | 1 refills | Status: DC

## 2022-01-20 MED ORDER — BACLOFEN 10 MG PO TABS: 10.0000 mg | ORAL_TABLET | Freq: Three times a day (TID) | ORAL | 1 refills | Status: DC

## 2022-01-20 MED ORDER — BUSPIRONE HCL 5 MG PO TABS: 5.0000 mg | ORAL_TABLET | Freq: Two times a day (BID) | ORAL | 1 refills | Status: DC

## 2022-01-20 MED ORDER — APIXABAN 5 MG PO TABS: 5.0000 mg | ORAL_TABLET | Freq: Two times a day (BID) | ORAL | 1 refills | Status: DC

## 2022-01-20 MED ORDER — AMLODIPINE BESYLATE 10 MG PO TABS: 10.0000 mg | ORAL_TABLET | Freq: Every day | ORAL | 1 refills | Status: DC

## 2022-01-20 MED ORDER — MONTELUKAST SODIUM 10 MG PO TABS: 10.0000 mg | ORAL_TABLET | Freq: Every day | ORAL | 1 refills | Status: DC

## 2022-01-20 MED ORDER — VENLAFAXINE HCL ER 75 MG PO CP24: 75.0000 mg | ORAL_CAPSULE | Freq: Every day | ORAL | 1 refills | Status: DC

## 2022-01-20 MED ORDER — FUROSEMIDE 40 MG PO TABS: 80.0000 mg | ORAL_TABLET | Freq: Two times a day (BID) | ORAL | 1 refills | Status: DC

## 2022-01-20 MED ORDER — PANTOPRAZOLE SODIUM 40 MG PO TBEC: 40.0000 mg | DELAYED_RELEASE_TABLET | Freq: Every day | ORAL | 1 refills | Status: DC

## 2022-01-20 MED ORDER — VENLAFAXINE HCL ER 150 MG PO CP24: ORAL_CAPSULE | ORAL | 1 refills | Status: DC

## 2022-01-20 MED ORDER — GABAPENTIN 300 MG PO CAPS: 600.0000 mg | ORAL_CAPSULE | Freq: Two times a day (BID) | ORAL | 1 refills | Status: DC

## 2022-01-20 MED ORDER — CETIRIZINE HCL 10 MG PO TABS: 10.0000 mg | ORAL_TABLET | Freq: Every day | ORAL | 1 refills | Status: DC

## 2022-01-20 MED ORDER — POTASSIUM CHLORIDE CRYS ER 20 MEQ PO TBCR: 20.0000 meq | EXTENDED_RELEASE_TABLET | Freq: Every day | ORAL | 1 refills | Status: DC

## 2022-01-20 MED ORDER — FLUTICASONE PROPIONATE 50 MCG/ACT NA SUSP: NASAL | 6 refills | Status: DC

## 2022-01-20 MED ORDER — HYDROXYZINE HCL 25 MG PO TABS: 25.0000 mg | ORAL_TABLET | Freq: Every evening | ORAL | 1 refills | Status: DC | PRN

## 2022-01-20 NOTE — Progress Notes (Signed)
BP 121/77   Pulse 70   Temp 98.1 F (36.7 C) (Oral)   Ht 5' 2.75" (1.594 m)   Wt 268 lb 11.2 oz (121.9 kg)   SpO2 97%   BMI 47.98 kg/m    Subjective:    Patient ID: Kaylee Fox, female    DOB: 08/13/1960, 61 y.o.   MRN: [id number]  HPI: Kaylee Fox is a 61 y.o. female presenting on 01/20/2022 for comprehensive medical examination. Current medical complaints include:  HYPERTENSION / HYPERLIPIDEMIA Satisfied with current treatment? {Blank single:19197::"yes","no"} Duration of hypertension: {Blank single:19197::"chronic","months","years"} BP monitoring frequency: {Blank single:19197::"not checking","rarely","daily","weekly","monthly","a few times a day","a few times a week","a few times a month"} BP medication side effects: {Blank single:19197::"yes","no"} Past BP meds: {Blank multiple:19196::"none","amlodipine","amlodipine/benazepril","atenolol","benazepril","benazepril/HCTZ","bisoprolol (bystolic)","carvedilol","chlorthalidone","clonidine","diltiazem","exforge HCT","HCTZ","irbesartan (avapro)","labetalol","lisinopril","lisinopril-HCTZ","losartan (cozaar)","methyldopa","nifedipine","olmesartan (benicar)","olmesartan-HCTZ","quinapril","ramipril","spironalactone","tekturna","valsartan","valsartan-HCTZ","verapamil"} Duration of hyperlipidemia: {Blank single:19197::"chronic","months","years"} Cholesterol medication side effects: {Blank single:19197::"yes","no"} Cholesterol supplements: {Blank multiple:19196::"none","fish oil","niacin","red yeast rice"} Past cholesterol medications: {Blank multiple:19196::"none","atorvastain (lipitor)","lovastatin (mevacor)","pravastatin (pravachol)","rosuvastatin (crestor)","simvastatin (zocor)","vytorin","fenofibrate (tricor)","gemfibrozil","ezetimide (zetia)","niaspan","lovaza"} Medication compliance: {Blank single:19197::"excellent compliance","good compliance","fair compliance","poor compliance"} Aspirin: {Blank single:19197::"yes","no"} Recent  stressors: {Blank single:19197::"yes","no"} Recurrent headaches: {Blank single:19197::"yes","no"} Visual changes: {Blank single:19197::"yes","no"} Palpitations: {Blank single:19197::"yes","no"} Dyspnea: {Blank single:19197::"yes","no"} Chest pain: {Blank single:19197::"yes","no"} Lower extremity edema: {Blank single:19197::"yes","no"} Dizzy/lightheaded: {Blank single:19197::"yes","no"}  DEPRESSION Mood status: controlled Satisfied with current treatment?: yes Symptom severity: mild  Duration of current treatment : chronic Side effects: no Medication compliance: excellent compliance Psychotherapy/counseling: no  Previous psychiatric medications: effexor, wellbutrin Depressed mood: {Blank single:19197::"yes","no"} Anxious mood: {Blank single:19197::"yes","no"} Anhedonia: {Blank single:19197::"yes","no"} Significant weight loss or gain: {Blank single:19197::"yes","no"} Insomnia: {Blank single:19197::"yes","no"} {Blank single:19197::"hard to fall asleep","hard to stay asleep"} Fatigue: {Blank single:19197::"yes","no"} Feelings of worthlessness or guilt: {Blank single:19197::"yes","no"} Impaired concentration/indecisiveness: {Blank single:19197::"yes","no"} Suicidal ideations: {Blank single:19197::"yes","no"} Hopelessness: {Blank single:19197::"yes","no"} Crying spells: {Blank single:19197::"yes","no"}    01/20/2022    3:43 PM 01/15/2022    2:37 PM 12/10/2021   12:12 PM 07/17/2021    3:26 PM 02/10/2021    3:42 PM  Depression screen PHQ 2/9  Decreased Interest 0 0  Down, Depressed, Hopeless 0 0 2 0 0  PHQ - 2 Score 0 0  Altered sleeping 0 Tired, decreased energy Change in appetite 0 0  Feeling bad or failure about yourself  0 0 0 1 0  Trouble concentrating 0 0 0 2 0  Moving slowly or fidgety/restless 0 0 0 1 0  Suicidal thoughts 0 0 0 0 0  PHQ-9 Score Difficult doing work/chores  Very difficult Somewhat difficult Not difficult  at all    HYPOTHYROIDISM Thyroid control status:{Blank single:19197::"controlled","uncontrolled","better","worse","exacerbated","stable"} Satisfied with current treatment? {Blank single:19197::"yes","no"} Medication side effects: {Blank single:19197::"yes","no"} Medication compliance: {Blank single:19197::"excellent compliance","good compliance","fair compliance","poor compliance"} Etiology of hypothyroidism:  Recent dose adjustment:{Blank single:19197::"yes","no"} Fatigue: {Blank single:19197::"yes","no"} Cold intolerance: {Blank single:19197::"yes","no"} Heat intolerance: {Blank single:19197::"yes","no"} Weight gain: {Blank single:19197::"yes","no"} Weight loss: {Blank single:19197::"yes","no"} Constipation: {Blank single:19197::"yes","no"} Diarrhea/loose stools: {Blank single:19197::"yes","no"} Palpitations: {Blank single:19197::"yes","no"} Lower extremity edema: {Blank single:19197::"yes","no"} Anxiety/depressed mood: {Blank single:19197::"yes","no"}  Menopausal Symptoms: no  Depression Screen done today and results listed below:     01/20/2022    3:43 PM 01/15/2022    2:37 PM 12/10/2021   12:12 PM 07/17/2021    3:26 PM 02/10/2021    3:42 PM  Depression screen PHQ 2/9  Decreased Interest 0 0  Down, Depressed, Hopeless 0 0 2 0 0  PHQ - 2 Score 0 0  Altered sleeping 0 2  3 2 2   Tired, decreased energy 2 3 3 2 3   Change in appetite 0 2 1 2  0  Feeling bad or failure about yourself  0 0 0 1 0  Trouble concentrating 0 0 0 2 0  Moving slowly or fidgety/restless 0 0 0 1 0  Suicidal thoughts 0 0 0 0 0  PHQ-9 Score 2 9 10 12 5   Difficult doing work/chores  Very difficult Somewhat difficult Not difficult at all     Past Medical History:  Past Medical History:  Diagnosis Date  . Anticoagulant long-term use   . Anxiety   . Autoimmune hypothyroidism   . Chronic hypokalemia   . Chronic pain   . Chronic, continuous use of opioids   . Depression   . Diverticulosis    . Fatigue   . Fibromyalgia   . Herpes labialis   . Herpes simplex   . History of pulmonary embolus (PE)   . History of pulmonary hypertension   . Hypertension   . Lupus (systemic lupus erythematosus) (HCC)   . Neuromuscular disorder (HCC)   . OSA on CPAP   . Osteoporosis   . Venous ulcers of both lower extremities (HCC) 06/17/2016  . Vitamin D deficiency     Surgical History:  Past Surgical History:  Procedure Laterality Date  . CESAREAN SECTION    . CHOLECYSTECTOMY    . COLONOSCOPY WITH PROPOFOL N/A 07/16/2016   Procedure: COLONOSCOPY WITH PROPOFOL;  Surgeon: , MD;  [facility] END[facility]COPY;  Service: Endoscopy;  Laterality: N/A;  . LAPAROSCOPIC GASTRIC SLEEVE RESECTION      Medications:  Current Outpatient Medications on File Prior to Visit  Medication Sig  . acetaminophen (TYLENOL) 325 MG tablet Take by mouth as needed.   08/17/2016 albuterol (VENTOLIN HFA) 108 (90 Base) MCG/ACT inhaler Inhale 2 puffs into the lungs every 6 (six) hours as needed for wheezing or shortness of breath.  09/15/2016 amLODipine (NORVASC) 10 MG tablet Take 1 tablet (10 mg total) by mouth daily.  Wyline Mood apixaban (ELIQUIS) 5 MG TABS tablet Take 1 tablet (5 mg total) by mouth 2 (two) times daily.  [facility] atorvastatin (LIPITOR) 40 MG tablet Take 1 tablet (40 mg total) by mouth daily.  Marland Kitchen azaTHIOprine (IMURAN) 50 MG tablet Take 100 mg by mouth daily.  . baclofen (LIORESAL) 10 MG tablet Take 1 tablet (10 mg total) by mouth 3 (three) times daily.  . benzonatate (TESSALON) 200 MG capsule Take 1 capsule (200 mg total) by mouth 2 (two) times daily as needed for cough.  Marland Kitchen buPROPion (WELLBUTRIN XL) 150 MG 24 hr tablet Take 1 tablet (150 mg total) by mouth daily.  . busPIRone (BUSPAR) 5 MG tablet Take 1 tablet (5 mg total) by mouth 2 (two) times daily.  . calcium carbonate (OS-CAL) 600 MG TABS tablet Take by mouth.  . cetirizine (ZYRTEC) 10 MG tablet Take 1 tablet (10 mg total) by mouth daily.  . Cholecalciferol 25 MCG (1000 UT)  capsule Take 1,000 Units by mouth daily. Takes 2 1000 mg per day  . diclofenac Sodium (VOLTAREN) 1 % GEL Apply 4 g topically 4 (four) times daily.  . fluticasone (FLONASE) 50 MCG/ACT nasal spray USE 2 SPRAYS INTO BOTH NOSTRILS DAILY.  . furosemide (LASIX) 40 MG tablet Take 2 tablets (80 mg total) by mouth 2 (two) times daily.  Marland Kitchen gabapentin (NEURONTIN) 300 MG capsule Take 2 capsules (600 mg total) by mouth 2 (two) times daily.  . hydroxychloroquine (PLAQUENIL) 200 MG  tablet Take 400 mg by mouth 2 (two) times daily.   . hydrOXYzine (ATARAX) 25 MG tablet Take 1 tablet (25 mg total) by mouth at bedtime as needed.  Marland Kitchen levothyroxine (SYNTHROID) 75 MCG tablet Take 1 tablet (75 mcg total) by mouth daily before breakfast.  . montelukast (SINGULAIR) 10 MG tablet Take 1 tablet (10 mg total) by mouth at bedtime.  . Multiple Vitamin (MULTI-VITAMINS) TABS Take by mouth.  . mupirocin cream (BACTROBAN) 2 % Apply to affected area on arms/legs twice a day as needed.  . nystatin (MYCOSTATIN) 100000 UNIT/ML suspension Take 5 mLs (500,000 Units total) by mouth 4 (four) times daily.  . pantoprazole (PROTONIX) 40 MG tablet Take 1 tablet (40 mg total) by mouth daily.  . potassium chloride SA (KLOR-CON M) 20 MEQ tablet Take 1 tablet (20 mEq total) by mouth daily.  Marland Kitchen triamcinolone cream (KENALOG) 0.1 % Apply 1 Application topically 2 (two) times daily.  Marland Kitchen venlafaxine XR (EFFEXOR-XR) 150 MG 24 hr capsule TAKE 1 CAPSULE DAILY WITH BREAKFAST with the 75mg  for 225mg  daily  . venlafaxine XR (EFFEXOR-XR) 75 MG 24 hr capsule Take 1 capsule (75 mg total) by mouth daily with breakfast. To be taken with 150mg  for 225mg  total  . predniSONE (DELTASONE) 50 MG tablet Take 1 tablet (50 mg total) by mouth daily with breakfast. (Patient not taking: Reported on 01/20/2022)   No current facility-administered medications on file prior to visit.    Allergies:  Allergies  Allergen Reactions  . Penicillins Rash  . Clindamycin Nausea Only   . Ciprofloxacin Other (See Comments)  . Morphine Other (See Comments)    Social History:  Social History   Socioeconomic History  . Marital status: Legally Separated    Spouse name: Not on file  . Number of children: 2  . Years of education: 78  . Highest education level: High school graduate  Occupational History  . Occupation: disability   Tobacco Use  . Smoking status: Never  . Smokeless tobacco: Never  Vaping Use  . Vaping Use: Never used  Substance and Sexual Activity  . Alcohol use: No  . Drug use: No  . Sexual activity: Yes    Birth control/protection: Post-menopausal  Other Topics Concern  . Not on file  Social History Narrative  . Not on file   Social Determinants of Health   Financial Resource Strain: Low Risk  (12/10/2021)   Overall Financial Resource Strain (CARDIA)   . Difficulty of Paying Living Expenses: Not hard at all  Food Insecurity: No Food Insecurity (12/10/2021)   Hunger Vital Sign   . Worried About 01/22/2022 in the Last Year: Never true   . Ran Out of Food in the Last Year: Never true  Transportation Needs: No Transportation Needs (12/10/2021)   PRAPARE - Transportation   . Lack of Transportation (Medical): No   . Lack of Transportation (Non-Medical): No  Physical Activity: Insufficiently Active (12/10/2021)   Exercise Vital Sign   . Days of Exercise per Week: 3 days   . Minutes of Exercise per Session: 30 min  Stress: Stress Concern Present (12/10/2021)   Programme researcher, broadcasting/film/video of Occupational Health - Occupational Stress Questionnaire   . Feeling of Stress : Rather much  Social Connections: Socially Isolated (12/10/2021)   Social Connection and Isolation Panel [NHANES]   . Frequency of Communication with Friends and Family: More than three times a week   . Frequency of Social Gatherings with Friends and Family: Never   .  Attends Religious Services: Never   . Active Member of Clubs or Organizations: No   . Attends Banker  Meetings: Never   . Marital Status: Separated  Intimate Partner Violence: Not At Risk (12/10/2021)   Humiliation, Afraid, Rape, and Kick questionnaire   . Fear of Current or Ex-Partner: No   . Emotionally Abused: No   . Physically Abused: No   . Sexually Abused: No   Social History   Tobacco Use  Smoking Status Never  Smokeless Tobacco Never   Social History   Substance and Sexual Activity  Alcohol Use No    Family History:  Family History  Problem Relation Age of Onset  . Arthritis Mother   . Osteoarthritis Mother   . Lupus Mother   . Hypothyroidism Mother   . Osteoarthritis Father   . Emphysema Father   . Coronary artery disease Father     Past medical history, surgical history, medications, allergies, family history and social history reviewed with patient today and changes made to appropriate areas of the chart.   Review of Systems  Constitutional: Negative.   HENT: Negative.    Eyes: Negative.   Respiratory:  Positive for cough. Negative for hemoptysis, sputum production, shortness of breath and wheezing.   Cardiovascular:  Positive for palpitations and leg swelling. Negative for orthopnea, claudication and PND.  Gastrointestinal:  Positive for heartburn. Negative for abdominal pain, blood in stool, constipation, diarrhea, melena, nausea and vomiting.  Genitourinary: Negative.   Musculoskeletal: Negative.   Skin: Negative.   Neurological:  Positive for dizziness. Negative for tingling, tremors, sensory change, speech change, focal weakness, seizures, loss of consciousness, weakness and headaches.  Endo/Heme/Allergies:  Positive for environmental allergies and polydipsia. Does not bruise/bleed easily.  Psychiatric/Behavioral: Negative.     All other ROS negative except what is listed above and in the HPI.      Objective:    BP 121/77   Pulse 70   Temp 98.1 F (36.7 C) (Oral)   Ht 5' 2.75" (1.594 m)   Wt 268 lb 11.2 oz (121.9 kg)   SpO2 97%   BMI 47.98  kg/m   Wt Readings from Last 3 Encounters:  01/20/22 268 lb 11.2 oz (121.9 kg)  01/15/22 268 lb 8 oz (121.8 kg)  07/17/21 273 lb 12.8 oz (124.2 kg)    Physical Exam Vitals and nursing note reviewed.  Constitutional:      General: She is not in acute distress.    Appearance: Normal appearance. She is obese. She is not ill-appearing, toxic-appearing or diaphoretic.  HENT:     Head: Normocephalic and atraumatic.     Right Ear: Tympanic membrane, ear canal and external ear normal. There is no impacted cerumen.     Left Ear: Tympanic membrane, ear canal and external ear normal. There is no impacted cerumen.     Nose: Nose normal. No congestion or rhinorrhea.     Mouth/Throat:     Mouth: Mucous membranes are moist.     Pharynx: Oropharynx is clear. No oropharyngeal exudate or posterior oropharyngeal erythema.  Eyes:     General: No scleral icterus.       Right eye: No discharge.        Left eye: No discharge.     Extraocular Movements: Extraocular movements intact.     Conjunctiva/sclera: Conjunctivae normal.     Pupils: Pupils are equal, round, and reactive to light.  Neck:     Vascular: No carotid bruit.  Cardiovascular:  Rate and Rhythm: Normal rate and regular rhythm.     Pulses: Normal pulses.     Heart sounds: No murmur heard.    No friction rub. No gallop.  Pulmonary:     Effort: Pulmonary effort is normal. No respiratory distress.     Breath sounds: Normal breath sounds. No stridor. No wheezing, rhonchi or rales.  Chest:     Chest wall: No tenderness.  Abdominal:     General: Abdomen is flat. Bowel sounds are normal. There is no distension.     Palpations: Abdomen is soft. There is no mass.     Tenderness: There is no abdominal tenderness. There is no right CVA tenderness, left CVA tenderness, guarding or rebound.     Hernia: No hernia is present.  Genitourinary:    Comments: Breast and pelvic exams deferred with shared decision making Musculoskeletal:         General: No swelling, tenderness, deformity or signs of injury.     Cervical back: Normal range of motion and neck supple. No rigidity. No muscular tenderness.     Right lower leg: No edema.     Left lower leg: No edema.  Lymphadenopathy:     Cervical: No cervical adenopathy.  Skin:    General: Skin is warm and dry.     Capillary Refill: Capillary refill takes less than 2 seconds.     Coloration: Skin is not jaundiced or pale.     Findings: No bruising, erythema, lesion or rash.  Neurological:     General: No focal deficit present.     Mental Status: She is alert and oriented to person, place, and time. Mental status is at baseline.     Cranial Nerves: No cranial nerve deficit.     Sensory: No sensory deficit.     Motor: No weakness.     Coordination: Coordination normal.     Gait: Gait normal.     Deep Tendon Reflexes: Reflexes normal.  Psychiatric:        Mood and Affect: Mood normal.        Behavior: Behavior normal.        Thought Content: Thought content normal.        Judgment: Judgment normal.    Results for orders placed or performed in visit on 01/15/22  Rapid Strep Screen ([facility] ONLY)   Specimen: Other   Other  Result Value Ref Range   Strep Gp A Ag, IA W/Reflex Negative Negative  Novel Coronavirus, NAA ([facility])   Specimen: Saline  Result Value Ref Range   SARS-CoV-2, NAA Not Detected Not Detected  Culture, Group A Strep   Other  Result Value Ref Range   Strep A Culture Negative   Veritor Flu A/B Waived  Result Value Ref Range   Influenza A Negative Negative   Influenza B Negative Negative      Assessment & Plan:   Problem List Items Addressed This Visit       Endocrine   Autoimmune hypothyroidism   Relevant Orders   CBC with Differential/Platelet   TSH     Genitourinary   Benign hypertensive renal disease   Relevant Orders   CBC with Differential/Platelet   Comprehensive metabolic panel   Urinalysis, Routine w reflex microscopic    Microalbumin, Urine Waived     Other   Depression   Relevant Orders   CBC with Differential/Platelet   Anxiety   Relevant Orders   CBC with Differential/Platelet   Vitamin  D deficiency   Relevant Orders   CBC with Differential/Platelet   VITAMIN D 25 Hydroxy (Vit-D Deficiency, Fractures)   Chronic hypokalemia   Relevant Orders   CBC with Differential/Platelet   Comprehensive metabolic panel   Mixed hyperlipidemia   Relevant Orders   CBC with Differential/Platelet   Comprehensive metabolic panel   Lipid Panel w/o Chol/HDL Ratio   Other Visit Diagnoses     Flu vaccine need    -  Primary   Relevant Orders   Flu Vaccine QUAD 6+ mos PF IM (Fluarix Quad PF) (Completed)        Follow up plan: No follow-ups on file.   LABORATORY TESTING:  - Pap smear:  Refused- will come back before the end of the year  IMMUNIZATIONS:   - Tdap: Tetanus vaccination status reviewed: last tetanus booster within 10 years. - Influenza: Administered today - Pneumovax: Not applicable - Prevnar: Not applicable - COVID: Up to date - HPV: Not applicable - Shingrix vaccine: Given elsewhere  SCREENING: -Mammogram: Done elsewhere  - Colonoscopy: Ordered today  - Bone Density: Not applicable   PATIENT COUNSELING:   Advised to take 1 mg of folate supplement per day if capable of pregnancy.   Sexuality: Discussed sexually transmitted diseases, partner selection, use of condoms, avoidance of unintended pregnancy  and contraceptive alternatives.   Advised to avoid cigarette smoking.  I discussed with the patient that most people either abstain from alcohol or drink within safe limits (<=14/week and <=4 drinks/occasion for males, <=7/weeks and <= 3 drinks/occasion for females) and that the risk for alcohol disorders and other health effects rises proportionally with the number of drinks per week and how often a drinker exceeds daily limits.  Discussed cessation/primary prevention of drug use and  availability of treatment for abuse.   Diet: Encouraged to adjust caloric intake to maintain  or achieve ideal body weight, to reduce intake of dietary saturated fat and total fat, to limit sodium intake by avoiding high sodium foods and not adding table salt, and to maintain adequate dietary potassium and calcium preferably from fresh fruits, vegetables, and low-fat dairy products.    stressed the importance of regular exercise  Injury prevention: Discussed safety belts, safety helmets, smoke detector, smoking near bedding or upholstery.   Dental health: Discussed importance of regular tooth brushing, flossing, and dental visits.    NEXT PREVENTATIVE PHYSICAL DUE IN 1 YEAR. No follow-ups on file.

## 2022-01-21 ENCOUNTER — Other Ambulatory Visit: Payer: Self-pay | Admitting: Family Medicine

## 2022-01-21 LAB — CBC WITH DIFFERENTIAL/PLATELET
Basophils Absolute: 0 10*3/uL (ref 0.0–0.2)
Basos: 0 %
EOS (ABSOLUTE): 0 10*3/uL (ref 0.0–0.4)
Eos: 0 %
Hematocrit: 38.4 % (ref 34.0–46.6)
Hemoglobin: 12.5 g/dL (ref 11.1–15.9)
Immature Grans (Abs): 0.1 10*3/uL (ref 0.0–0.1)
Immature Granulocytes: 1 %
Lymphocytes Absolute: 0.9 10*3/uL (ref 0.7–3.1)
Lymphs: 9 %
MCH: 31.9 pg (ref 26.6–33.0)
MCHC: 32.6 g/dL (ref 31.5–35.7)
MCV: 98 fL — ABNORMAL HIGH (ref 79–97)
Monocytes Absolute: 0.3 10*3/uL (ref 0.1–0.9)
Monocytes: 3 %
Neutrophils Absolute: 9.2 10*3/uL — ABNORMAL HIGH (ref 1.4–7.0)
Neutrophils: 87 %
Platelets: 368 10*3/uL (ref 150–450)
RBC: 3.92 x10E6/uL (ref 3.77–5.28)
RDW: 11.9 % (ref 11.7–15.4)
WBC: 10.5 10*3/uL (ref 3.4–10.8)

## 2022-01-21 LAB — COMPREHENSIVE METABOLIC PANEL
ALT: 15 IU/L (ref 0–32)
AST: 13 IU/L (ref 0–40)
Albumin/Globulin Ratio: 1.3 (ref 1.2–2.2)
Albumin: 3.9 g/dL (ref 3.8–4.9)
Alkaline Phosphatase: 123 IU/L — ABNORMAL HIGH (ref 44–121)
BUN/Creatinine Ratio: 14 (ref 12–28)
BUN: 14 mg/dL (ref 8–27)
Bilirubin Total: 0.3 mg/dL (ref 0.0–1.2)
CO2: 23 mmol/L (ref 20–29)
Calcium: 9 mg/dL (ref 8.7–10.3)
Chloride: 104 mmol/L (ref 96–106)
Creatinine, Ser: 1.02 mg/dL — ABNORMAL HIGH (ref 0.57–1.00)
Globulin, Total: 3 g/dL (ref 1.5–4.5)
Glucose: 128 mg/dL — ABNORMAL HIGH (ref 70–99)
Potassium: 4.2 mmol/L (ref 3.5–5.2)
Sodium: 144 mmol/L (ref 134–144)
Total Protein: 6.9 g/dL (ref 6.0–8.5)
eGFR: 63 mL/min/{1.73_m2} (ref >59)

## 2022-01-21 LAB — TSH: TSH: 0.725 u[IU]/mL (ref 0.450–4.500)

## 2022-01-21 LAB — VITAMIN D 25 HYDROXY (VIT D DEFICIENCY, FRACTURES): Vit D, 25-Hydroxy: 32.9 ng/mL (ref 30.0–100.0)

## 2022-01-21 LAB — LIPID PANEL W/O CHOL/HDL RATIO
Cholesterol, Total: 160 mg/dL (ref 100–199)
HDL: 49 mg/dL (ref >39)
LDL Chol Calc (NIH): 98 mg/dL (ref 0–99)
Triglycerides: 66 mg/dL (ref 0–149)
VLDL Cholesterol Cal: 13 mg/dL (ref 5–40)

## 2022-01-21 MED ORDER — LEVOTHYROXINE SODIUM 75 MCG PO TABS: 75.0000 ug | ORAL_TABLET | Freq: Every day | ORAL | 3 refills | Status: DC

## 2022-01-21 NOTE — Assessment & Plan Note (Signed)
Under good control on current regimen. Continue current regimen. Continue to monitor. Call with any concerns. Refills given. Labs drawn today.   

## 2022-01-21 NOTE — Assessment & Plan Note (Signed)
Rechecking labs today. Await results. Treat as needed.  °

## 2022-02-22 ENCOUNTER — Ambulatory Visit: Payer: Medicare (Managed Care) | Admitting: Family Medicine

## 2022-02-23 DIAGNOSIS — Z79899 Other long term (current) drug therapy: Secondary | ICD-10-CM | POA: Diagnosis not present

## 2022-02-23 DIAGNOSIS — M329 Systemic lupus erythematosus, unspecified: Secondary | ICD-10-CM | POA: Diagnosis not present

## 2022-03-24 ENCOUNTER — Ambulatory Visit
Admission: RE | Admit: 2022-03-24 | Discharge: 2022-03-24 | Disposition: A | Discharge status: Home / Self Care | Payer: Medicare HMO | Source: Ambulatory Visit | Attending: Family Medicine | Admitting: Family Medicine

## 2022-03-24 DIAGNOSIS — Z1231 Encounter for screening mammogram for malignant neoplasm of breast: Secondary | ICD-10-CM | POA: Insufficient documentation

## 2022-03-29 ENCOUNTER — Telehealth: Payer: Self-pay | Admitting: Family Medicine

## 2022-03-29 DIAGNOSIS — H579 Unspecified disorder of eye and adnexa: Secondary | ICD-10-CM

## 2022-03-29 NOTE — Telephone Encounter (Signed)
Referral Request - Has patient seen PCP for this complaint? yes *If NO, is insurance requiring patient see PCP for this issue before PCP can refer them? Referral for which specialty: eye doctor Preferred provider/office: [facility]. Fax [phone] Reason for referral: eye pain and itchy

## 2022-03-30 NOTE — Telephone Encounter (Signed)
THis referral has already been placed

## 2022-04-02 ENCOUNTER — Telehealth: Payer: Self-pay | Admitting: Family Medicine

## 2022-04-02 NOTE — Telephone Encounter (Signed)
Results were sent via mychart- please relay results.

## 2022-04-02 NOTE — Telephone Encounter (Signed)
Copied from [place] [id number]. Topic: General - Inquiry >> Apr 02, 2022 11:21 AM Penni Bombard wrote: Reason for CRM: pt called asking what her mammogram results were.  She had it on the 3rd and has not heard anything back yet,  CB# [phone]

## 2022-04-05 DIAGNOSIS — M329 Systemic lupus erythematosus, unspecified: Secondary | ICD-10-CM | POA: Diagnosis not present

## 2022-04-05 DIAGNOSIS — H02889 Meibomian gland dysfunction of unspecified eye, unspecified eyelid: Secondary | ICD-10-CM | POA: Diagnosis not present

## 2022-04-05 DIAGNOSIS — Z79899 Other long term (current) drug therapy: Secondary | ICD-10-CM | POA: Diagnosis not present

## 2022-04-05 DIAGNOSIS — H16223 Keratoconjunctivitis sicca, not specified as Sjogren's, bilateral: Secondary | ICD-10-CM | POA: Diagnosis not present

## 2022-04-06 NOTE — Telephone Encounter (Signed)
Patient was notified and verbalized understanding.

## 2022-05-03 DIAGNOSIS — J449 Chronic obstructive pulmonary disease, unspecified: Secondary | ICD-10-CM | POA: Diagnosis not present

## 2022-05-03 DIAGNOSIS — Z8249 Family history of ischemic heart disease and other diseases of the circulatory system: Secondary | ICD-10-CM | POA: Diagnosis not present

## 2022-05-03 DIAGNOSIS — E876 Hypokalemia: Secondary | ICD-10-CM | POA: Diagnosis not present

## 2022-05-03 DIAGNOSIS — R030 Elevated blood-pressure reading, without diagnosis of hypertension: Secondary | ICD-10-CM | POA: Diagnosis not present

## 2022-05-03 DIAGNOSIS — R69 Illness, unspecified: Secondary | ICD-10-CM | POA: Diagnosis not present

## 2022-05-03 DIAGNOSIS — K219 Gastro-esophageal reflux disease without esophagitis: Secondary | ICD-10-CM | POA: Diagnosis not present

## 2022-05-03 DIAGNOSIS — I509 Heart failure, unspecified: Secondary | ICD-10-CM | POA: Diagnosis not present

## 2022-05-03 DIAGNOSIS — Z008 Encounter for other general examination: Secondary | ICD-10-CM | POA: Diagnosis not present

## 2022-05-03 DIAGNOSIS — E039 Hypothyroidism, unspecified: Secondary | ICD-10-CM | POA: Diagnosis not present

## 2022-05-03 DIAGNOSIS — M1990 Unspecified osteoarthritis, unspecified site: Secondary | ICD-10-CM | POA: Diagnosis not present

## 2022-05-03 DIAGNOSIS — J309 Allergic rhinitis, unspecified: Secondary | ICD-10-CM | POA: Diagnosis not present

## 2022-05-10 ENCOUNTER — Other Ambulatory Visit: Payer: Self-pay | Admitting: Family Medicine

## 2022-05-10 DIAGNOSIS — Z79899 Other long term (current) drug therapy: Secondary | ICD-10-CM | POA: Diagnosis not present

## 2022-05-10 NOTE — Telephone Encounter (Signed)
Medication Refill - Medication: albuterol (VENTOLIN HFA) 108 (90 Base) MCG/ACT inhaler , hydrOXYzine (ATARAX) 25 MG tablet   Has the patient contacted their pharmacy? Yes.     Preferred Pharmacy (with phone number or street name):  [id number] [address] Phone: [phone] Fax: [phone]    Has the patient been seen for an appointment in the last year OR does the patient have an upcoming appointment? Yes.     Please assist patient further. Patient also has new insurance with [employer] will provider insurance information as soon as possible

## 2022-05-11 MED ORDER — ALBUTEROL SULFATE HFA 108 (90 BASE) MCG/ACT IN AERS: 2.0000 | INHALATION_SPRAY | Freq: Four times a day (QID) | RESPIRATORY_TRACT | 2 refills | Status: DC | PRN

## 2022-05-11 MED ORDER — HYDROXYZINE HCL 25 MG PO TABS: 25.0000 mg | ORAL_TABLET | Freq: Every evening | ORAL | 0 refills | Status: DC | PRN

## 2022-05-17 DIAGNOSIS — H16223 Keratoconjunctivitis sicca, not specified as Sjogren's, bilateral: Secondary | ICD-10-CM | POA: Diagnosis not present

## 2022-07-30 ENCOUNTER — Other Ambulatory Visit: Payer: Self-pay | Admitting: Family Medicine

## 2022-07-30 NOTE — Telephone Encounter (Signed)
Requested Prescriptions  Pending Prescriptions Disp Refills   atorvastatin (LIPITOR) 40 MG tablet [Pharmacy Med Name: ATORVASTATIN 40 MG TABLET] 90 tablet 1    Sig: TAKE 1 TABLET BY MOUTH EVERY DAY     Cardiovascular:  Antilipid - Statins Failed - 07/30/2022  2:28 AM      Failed - Lipid Panel in normal range within the last 12 months    Cholesterol, Total  Date Value Ref Range Status  01/20/2022 160 100 - 199 mg/dL Final   LDL Chol Calc (NIH)  Date Value Ref Range Status  01/20/2022 98 0 - 99 mg/dL Final   HDL  Date Value Ref Range Status  01/20/2022 49 >39 mg/dL Final   Triglycerides  Date Value Ref Range Status  01/20/2022 66 0 - 149 mg/dL Final         Passed - Patient is not pregnant      Passed - Valid encounter within last 12 months    Recent Outpatient Visits           6 months ago Routine general medical examination at a health care facility   [facility], Megan P, DO   6 months ago Acute cough   [place] [facility] [place], [place] P, DO   1 year ago Varicose veins of both lower extremities with pain   [place] [facility] [place], Megan P, DO   1 year ago Plantar fasciitis of right foot    [facility] [place], Megan P, DO   1 year ago Routine general medical examination at a health care facility   [facility], Megan P, DO

## 2022-08-01 ENCOUNTER — Other Ambulatory Visit: Payer: Self-pay | Admitting: Family Medicine

## 2022-08-02 ENCOUNTER — Other Ambulatory Visit: Payer: Self-pay | Admitting: Family Medicine

## 2022-08-03 NOTE — Telephone Encounter (Signed)
Called pt - left message on machine to return call and schedule and office visit.

## 2022-08-03 NOTE — Telephone Encounter (Signed)
Courtesy refill. Patient will need an office visit for further refills. Requested Prescriptions  Pending Prescriptions Disp Refills   montelukast (SINGULAIR) 10 MG tablet [Pharmacy Med Name: MONTELUKAST SOD 10 MG TABLET] 90 tablet 1    Sig: TAKE 1 TABLET BY MOUTH AT BEDTIME     Pulmonology:  Leukotriene Inhibitors Passed - 08/01/2022  8:47 AM      Passed - Valid encounter within last 12 months    Recent Outpatient Visits           6 months ago Routine general medical examination at a health care facility   [facility], [facility] P, DO   6 months ago Acute cough   [place] [facility] [place], [place] P, DO   1 year ago Varicose veins of both lower extremities with pain   [facility] [place], Megan P, DO   1 year ago Plantar fasciitis of right foot   [place] [facility] [place], Megan P, DO   1 year ago Routine general medical examination at a health care facility   [facility], [facility], DO               venlafaxine XR (EFFEXOR-XR) 75 MG 24 hr capsule [Pharmacy Med Name: VENLAFAXINE HCL ER 75 MG CAP] 90 capsule 1    Sig: TAKE 1 CAPSULE BY MOUTH EVERY DAY WITH BREAKFAST WITH 150MG  FOR TOTAL DAILY DOSE OF 225MG      Psychiatry: Antidepressants - SNRI - desvenlafaxine & venlafaxine Failed - 08/01/2022  8:47 AM      Failed - Cr in normal range and within 360 days    Creatinine, Ser  Date Value Ref Range Status  01/20/2022 1.02 (H) 0.57 - 1.00 mg/dL Final         Failed - Valid encounter within last 6 months    Recent Outpatient Visits           6 months ago Routine general medical examination at a health care facility   [facility] [place], [place] P, DO   6 months ago Acute cough   [facility] [place], [place] P, DO   1 year ago Varicose veins of both lower extremities with pain   [place] [facility] [place], Megan P, DO   1 year ago Plantar fasciitis of right foot   [place] [facility] [place], Megan P, DO   1 year ago Routine general medical examination at a health care facility   [facility] [place], [place] P, DO              Failed - Lipid Panel in normal range within the last 12 months    Cholesterol, Total  Date Value Ref Range Status  01/20/2022 160 100 - 199 mg/dL Final   LDL Chol Calc (NIH)  Date Value Ref Range Status  01/20/2022 98 0 - 99 mg/dL Final   HDL  Date Value Ref Range Status  01/20/2022 49 >39 mg/dL Final   Triglycerides  Date Value Ref Range Status  01/20/2022 66 0 - 149 mg/dL Final         Passed - Completed PHQ-2 or PHQ-9 in the last 360 days      Passed - Last BP in normal range    BP Readings from Last 1 Encounters:  01/20/22 121/77  baclofen (LIORESAL) 10 MG tablet [Pharmacy Med Name: BACLOFEN 10 MG TABLET] 90 tablet 0    Sig: TAKE 1 TABLET BY MOUTH 3 TIMES A DAY     Analgesics:  Muscle Relaxants - baclofen Failed - 08/01/2022  8:47 AM      Failed - Cr in normal range and within 180 days    Creatinine, Ser  Date Value Ref Range Status  01/20/2022 1.02 (H) 0.57 - 1.00 mg/dL Final         Failed - eGFR is 30 or above and within 180 days    GFR calc Af Amer  Date Value Ref Range Status  12/04/2019 97 >59 mL/min/1.73 Final    Comment:    **[facility] currently reports eGFR in compliance with the current**   recommendations of the [employer]. [facility] will   update reporting as new guidelines are published from the NKF-ASN   Task force.    GFR calc non Af Amer  Date Value Ref Range Status  12/04/2019 84 >59 mL/min/1.73 Final   eGFR  Date Value Ref Range Status  01/20/2022 63 >59 mL/min/1.73 Final         Failed - Valid encounter within last 6 months    Recent Outpatient Visits           6 months ago Routine general medical examination at a health care  facility   [facility], [place] P, DO   6 months ago Acute cough   [place] [facility] [place], [place] P, DO   1 year ago Varicose veins of both lower extremities with pain   [place] [facility] [place], [place] P, DO   1 year ago Plantar fasciitis of right foot   [place] [facility] [place], [place] P, DO   1 year ago Routine general medical examination at a health care facility   [facility], [facility] P, DO               busPIRone (BUSPAR) 5 MG tablet [Pharmacy Med Name: BUSPIRONE HCL 5 MG TABLET] 180 tablet 1    Sig: TAKE 1 TABLET BY MOUTH TWICE A DAY     Psychiatry: Anxiolytics/Hypnotics - Non-controlled Passed - 08/01/2022  8:47 AM      Passed - Valid encounter within last 12 months    Recent Outpatient Visits           6 months ago Routine general medical examination at a health care facility   [facility] [place] P, DO   6 months ago Acute cough   [place] [place], [place] P, DO   1 year ago Varicose veins of both lower extremities with pain   [place] [facility] [place], Megan P, DO   1 year ago Plantar fasciitis of right foot    [facility], [facility] P, DO   1 year ago Routine general medical examination at a health care facility   [facility], [facility] P, DO               buPROPion (WELLBUTRIN XL) 150 MG 24 hr tablet [Pharmacy Med Name: BUPROPION HCL XL 150 MG TABLET] 30 tablet 0    Sig: TAKE 1 TABLET BY MOUTH EVERY DAY     Psychiatry: Antidepressants - bupropion Failed - 08/01/2022  8:47 AM      Failed -  Cr in normal range and within 360 days    Creatinine, Ser  Date Value Ref Range Status  01/20/2022 1.02 (H) 0.57 - 1.00 mg/dL Final         Failed - Valid encounter within last 6 months    Recent Outpatient Visits            6 months ago Routine general medical examination at a health care facility   [facility] [place] [facility], DO   6 months ago Acute cough   [place] [facility] [place], [place] P, DO   1 year ago Varicose veins of both lower extremities with pain   [place] [facility] [place], Megan P, DO   1 year ago Plantar fasciitis of right foot   [place] [place], Megan P, DO   1 year ago Routine general medical examination at a health care facility   [facility], [facility] P, DO              Passed - AST in normal range and within 360 days    AST  Date Value Ref Range Status  01/20/2022 13 0 - 40 IU/L Final         Passed - ALT in normal range and within 360 days    ALT  Date Value Ref Range Status  01/20/2022 15 0 - 32 IU/L Final         Passed - Completed PHQ-2 or PHQ-9 in the last 360 days      Passed - Last BP in normal range    BP Readings from Last 1 Encounters:  01/20/22 121/77          pantoprazole (PROTONIX) 40 MG tablet [Pharmacy Med Name: PANTOPRAZOLE SOD DR 40 MG TAB] 90 tablet 1    Sig: TAKE 1 TABLET BY MOUTH EVERY DAY     Gastroenterology: Proton Pump Inhibitors Passed - 08/01/2022  8:47 AM      Passed - Valid encounter within last 12 months    Recent Outpatient Visits           6 months ago Routine general medical examination at a health care facility   [facility], [facility] P, DO   6 months ago Acute cough   [place] [facility] [place], [place] P, DO   1 year ago Varicose veins of both lower extremities with pain   [place] [facility] [place], Megan P, DO   1 year ago Plantar fasciitis of right foot   [place] [facility] [place], Megan P, DO   1 year ago Routine general medical examination at a health care facility   [facility], [facility] P, DO               cetirizine (ZYRTEC) 10 MG tablet [Pharmacy Med Name: CETIRIZINE HCL 10 MG TABLET] 90 tablet 1    Sig: TAKE 1 TABLET BY MOUTH EVERY DAY     Ear, Nose, and Throat:  Antihistamines 2 Failed - 08/01/2022  8:47 AM      Failed - Cr in normal range and within 360 days    Creatinine, Ser  Date Value Ref Range Status  01/20/2022 1.02 (H) 0.57 - 1.00 mg/dL Final         Passed - Valid encounter within last 12 months    Recent Outpatient Visits  6 months ago Routine general medical examination at a health care facility   [facility] [place], [place] P, DO   6 months ago Acute cough   [place] [place], [place] P, DO   1 year ago Varicose veins of both lower extremities with pain   [place] [facility] [place], Megan P, DO   1 year ago Plantar fasciitis of right foot   [place] [facility] [place], Megan P, DO   1 year ago Routine general medical examination at a health care facility   [facility], [facility] P, DO               venlafaxine XR (EFFEXOR-XR) 150 MG 24 hr capsule [Pharmacy Med Name: VENLAFAXINE HCL ER 150 MG CAP] 90 capsule 1    Sig: TAKE 1 CAPSULE BY MOUTH EVERY DAY WITH BREAKFAST WITH 75MG  FOR 225MG  DAILY     Psychiatry: Antidepressants - SNRI - desvenlafaxine & venlafaxine Failed - 08/01/2022  8:47 AM      Failed - Cr in normal range and within 360 days    Creatinine, Ser  Date Value Ref Range Status  01/20/2022 1.02 (H) 0.57 - 1.00 mg/dL Final         Failed - Valid encounter within last 6 months    Recent Outpatient Visits           6 months ago Routine general medical examination at a health care facility   [facility], [place] P, DO   6 months ago Acute cough   [place] [facility] [place], [place] P, DO   1 year ago Varicose veins of both lower extremities  with pain   [place] [facility] [place], Megan P, DO   1 year ago Plantar fasciitis of right foot   [place] [facility] [place], Megan P, DO   1 year ago Routine general medical examination at a health care facility   [facility] [place], [place] P, DO              Failed - Lipid Panel in normal range within the last 12 months    Cholesterol, Total  Date Value Ref Range Status  01/20/2022 160 100 - 199 mg/dL Final   LDL Chol Calc (NIH)  Date Value Ref Range Status  01/20/2022 98 0 - 99 mg/dL Final   HDL  Date Value Ref Range Status  01/20/2022 49 >39 mg/dL Final   Triglycerides  Date Value Ref Range Status  01/20/2022 66 0 - 149 mg/dL Final         Passed - Completed PHQ-2 or PHQ-9 in the last 360 days      Passed - Last BP in normal range    BP Readings from Last 1 Encounters:  01/20/22 121/77          furosemide (LASIX) 40 MG tablet [Pharmacy Med Name: FUROSEMIDE 40 MG TABLET] 360 tablet 1    Sig: TAKE 2 TABLETS BY MOUTH TWICE A DAY     Cardiovascular:  Diuretics - Loop Failed - 08/01/2022  8:47 AM      Failed - K in normal range and within 180 days    Potassium  Date Value Ref Range Status  01/20/2022 4.2 3.5 - 5.2 mmol/L Final         Failed - Ca in normal range and within 180  days    Calcium  Date Value Ref Range Status  01/20/2022 9.0 8.7 - 10.3 mg/dL Final         Failed - Na in normal range and within 180 days    Sodium  Date Value Ref Range Status  01/20/2022 144 134 - 144 mmol/L Final         Failed - Cr in normal range and within 180 days    Creatinine, Ser  Date Value Ref Range Status  01/20/2022 1.02 (H) 0.57 - 1.00 mg/dL Final         Failed - Cl in normal range and within 180 days    Chloride  Date Value Ref Range Status  01/20/2022 104 96 - 106 mmol/L Final         Failed - Mg Level in normal range and within 180 days    No results found for: "MG"       Failed - Valid  encounter within last 6 months    Recent Outpatient Visits           6 months ago Routine general medical examination at a health care facility   [facility], DO   6 months ago Acute cough   [place] [facility] [place], [place] P, DO   1 year ago Varicose veins of both lower extremities with pain   [place] [facility] [place] [place], Megan P, DO   1 year ago Plantar fasciitis of right foot   [place] [facility] [place], Megan P, DO   1 year ago Routine general medical examination at a health care facility   [facility], [facility] P, DO              Passed - Last BP in normal range    BP Readings from Last 1 Encounters:  01/20/22 121/77          KLOR-CON M20 20 MEQ tablet [Pharmacy Med Name: KLOR-CON M20 TABLET] 90 tablet 1    Sig: TAKE 1 TABLET BY MOUTH EVERY DAY     Endocrinology:  Minerals - Potassium Supplementation Failed - 08/01/2022  8:47 AM      Failed - Cr in normal range and within 360 days    Creatinine, Ser  Date Value Ref Range Status  01/20/2022 1.02 (H) 0.57 - 1.00 mg/dL Final         Passed - K in normal range and within 360 days    Potassium  Date Value Ref Range Status  01/20/2022 4.2 3.5 - 5.2 mmol/L Final         Passed - Valid encounter within last 12 months    Recent Outpatient Visits           6 months ago Routine general medical examination at a health care facility   [facility] [place], [place] P, DO   6 months ago Acute cough   [place] [facility] [place], [place] P, DO   1 year ago Varicose veins of both lower extremities with pain   [place] [facility] [place], Megan P, DO   1 year ago Plantar fasciitis of right foot   [place] [place], Megan P, DO   1 year ago Routine general medical examination at a health care facility   [facility], [facility] P, DO

## 2022-08-04 NOTE — Telephone Encounter (Signed)
Requested Prescriptions  Pending Prescriptions Disp Refills   hydrOXYzine (ATARAX) 25 MG tablet [Pharmacy Med Name: HYDROXYZINE HCL 25 MG TABLET] 90 tablet 0    Sig: TAKE 1 TABLET BY MOUTH AT BEDTIME AS NEEDED.     Ear, Nose, and Throat:  Antihistamines 2 Failed - 08/02/2022  4:06 PM      Failed - Cr in normal range and within 360 days    Creatinine, Ser  Date Value Ref Range Status  01/20/2022 1.02 (H) 0.57 - 1.00 mg/dL Final         Passed - Valid encounter within last 12 months    Recent Outpatient Visits           6 months ago Routine general medical examination at a health care facility   [facility], [place] P, DO   6 months ago Acute cough   [facility] [place], [place] P, DO   1 year ago Varicose veins of both lower extremities with pain   [place] [facility] [place], Megan P, DO   1 year ago Plantar fasciitis of right foot   [place] [facility] [place], [place] P, DO   1 year ago Routine general medical examination at a health care facility   [facility], Megan P, DO               gabapentin (NEURONTIN) 300 MG capsule [Pharmacy Med Name: GABAPENTIN 300 MG CAPSULE] 360 capsule 1    Sig: TAKE 2 CAPSULES BY MOUTH TWICE A DAY     Neurology: Anticonvulsants - gabapentin Failed - 08/02/2022  4:06 PM      Failed - Cr in normal range and within 360 days    Creatinine, Ser  Date Value Ref Range Status  01/20/2022 1.02 (H) 0.57 - 1.00 mg/dL Final         Passed - Completed PHQ-2 or PHQ-9 in the last 360 days      Passed - Valid encounter within last 12 months    Recent Outpatient Visits           6 months ago Routine general medical examination at a health care facility   [facility], [place] P, DO   6 months ago Acute cough   [place] [facility] [place], [place] P, DO   1 year ago Varicose veins of  both lower extremities with pain   [place] [facility] [place], Megan P, DO   1 year ago Plantar fasciitis of right foot   [place] [facility] [place], Megan P, DO   1 year ago Routine general medical examination at a health care facility   [facility], Megan P, DO               ELIQUIS 5 MG TABS tablet [Pharmacy Med Name: ELIQUIS 5 MG TABLET] 180 tablet 1    Sig: TAKE 1 TABLET BY MOUTH TWICE A DAY     Hematology:  Anticoagulants - apixaban Failed - 08/02/2022  4:06 PM      Failed - Cr in normal range and within 360 days    Creatinine, Ser  Date Value Ref Range Status  01/20/2022 1.02 (H) 0.57 - 1.00 mg/dL Final         Passed - PLT in normal range and within 360 days    Platelets  Date Value Ref  Range Status  01/20/2022 368 150 - 450 x10E3/uL Final         Passed - HGB in normal range and within 360 days    Hemoglobin  Date Value Ref Range Status  01/20/2022 12.5 11.1 - 15.9 g/dL Final         Passed - HCT in normal range and within 360 days    Hematocrit  Date Value Ref Range Status  01/20/2022 38.4 34.0 - 46.6 % Final         Passed - AST in normal range and within 360 days    AST  Date Value Ref Range Status  01/20/2022 13 0 - 40 IU/L Final         Passed - ALT in normal range and within 360 days    ALT  Date Value Ref Range Status  01/20/2022 15 0 - 32 IU/L Final         Passed - Valid encounter within last 12 months    Recent Outpatient Visits           6 months ago Routine general medical examination at a health care facility   [facility] [place] P, DO   6 months ago Acute cough   [facility] [place], [place] P, DO   1 year ago Varicose veins of both lower extremities with pain   [place] [facility] [place], Megan P, DO   1 year ago Plantar fasciitis of right foot   [place] [facility]  [place], Megan P, DO   1 year ago Routine general medical examination at a health care facility   [facility], Megan P, DO

## 2022-08-26 ENCOUNTER — Other Ambulatory Visit: Payer: Self-pay | Admitting: Family Medicine

## 2022-08-26 NOTE — Telephone Encounter (Signed)
Requested medications are due for refill today.  yes  Requested medications are on the active medications list.  yes  Last refill. 08/03/2022 #30 0 rf  Future visit scheduled.   no  Notes to clinic.  Courtesy refill already given.    Requested Prescriptions  Pending Prescriptions Disp Refills   venlafaxine XR (EFFEXOR-XR) 150 MG 24 hr capsule [Pharmacy Med Name: VENLAFAXINE HCL ER 150 MG CAP] 90 capsule 1    Sig: TAKE 1 CAPSULE BY MOUTH EVERY DAY WITH BREAKFAST WITH 75MG  FOR 225MG  DAILY     Psychiatry: Antidepressants - SNRI - desvenlafaxine & venlafaxine Failed - 08/26/2022  2:32 PM      Failed - Cr in normal range and within 360 days    Creatinine, Ser  Date Value Ref Range Status  01/20/2022 1.02 (H) 0.57 - 1.00 mg/dL Final         Failed - Valid encounter within last 6 months    Recent Outpatient Visits           7 months ago Routine general medical examination at a health care facility   [facility], [facility], DO   7 months ago Acute cough   [place] [facility] [place], [place] P, DO   1 year ago Varicose veins of both lower extremities with pain   [place] [facility] [place], Megan P, DO   1 year ago Plantar fasciitis of right foot   [place] [place], Megan P, DO   1 year ago Routine general medical examination at a health care facility   [facility] [place] [place], [place] P, DO              Failed - Lipid Panel in normal range within the last 12 months    Cholesterol, Total  Date Value Ref Range Status  01/20/2022 160 100 - 199 mg/dL Final   LDL Chol Calc (NIH)  Date Value Ref Range Status  01/20/2022 98 0 - 99 mg/dL Final   HDL  Date Value Ref Range Status  01/20/2022 49 >39 mg/dL Final   Triglycerides  Date Value Ref Range Status  01/20/2022 66 0 - 149 mg/dL Final         Passed - Completed PHQ-2 or PHQ-9 in the last 360 days      Passed  - Last BP in normal range    BP Readings from Last 1 Encounters:  01/20/22 121/77

## 2022-08-26 NOTE — Telephone Encounter (Signed)
Requested medication (s) are due for refill today: Due 09/03/22  Requested medication (s) are on the active medication list: yes    Last refill: 08/03/22  #90  0 refills  Future visit scheduled no  Notes to clinic:Pt needs appt. Attempted to reach pt, VM full. Please review. Thank you.  Requested Prescriptions  Pending Prescriptions Disp Refills   baclofen (LIORESAL) 10 MG tablet [Pharmacy Med Name: BACLOFEN 10 MG TABLET] 270 tablet 1    Sig: TAKE 1 TABLET BY MOUTH THREE TIMES A DAY     Analgesics:  Muscle Relaxants - baclofen Failed - 08/26/2022  8:30 AM      Failed - Cr in normal range and within 180 days    Creatinine, Ser  Date Value Ref Range Status  01/20/2022 1.02 (H) 0.57 - 1.00 mg/dL Final         Failed - eGFR is 30 or above and within 180 days    GFR calc Af Amer  Date Value Ref Range Status  12/04/2019 97 >59 mL/min/1.73 Final    Comment:    **[facility] currently reports eGFR in compliance with the current**   recommendations of the [employer]. [facility] will   update reporting as new guidelines are published from the NKF-ASN   Task force.    GFR calc non Af Amer  Date Value Ref Range Status  12/04/2019 84 >59 mL/min/1.73 Final   eGFR  Date Value Ref Range Status  01/20/2022 63 >59 mL/min/1.73 Final         Failed - Valid encounter within last 6 months    Recent Outpatient Visits           7 months ago Routine general medical examination at a health care facility   [facility], [place] P, DO   7 months ago Acute cough   [place] [facility] [place], [place] P, DO   1 year ago Varicose veins of both lower extremities with pain   [place] [facility] [place], Megan P, DO   1 year ago Plantar fasciitis of right foot    [place] [facility] [place], Megan P, DO   1 year ago Routine general medical examination at a health care facility   [facility], [facility] P, DO

## 2022-08-26 NOTE — Telephone Encounter (Signed)
Requested medication (s) are due for refill today: Due 09/03/22  Requested medication (s) are on the active medication list: yes  Last refill: all meds 08/03/23  #30 (Courtesy refill)  Future visit scheduled no  Notes to clinic: Already received courtesy refills. Attempted to reach pt t secure appt, unable to reach.  Requested Prescriptions  Pending Prescriptions Disp Refills   venlafaxine XR (EFFEXOR-XR) 75 MG 24 hr capsule [Pharmacy Med Name: VENLAFAXINE HCL ER 75 MG CAP] 90 capsule 1    Sig: TAKE 1 CAPSULE BY MOUTH EVERY DAY WITH BREAKFAST WITH 150MG  FOR TOTAL DAILY DOSE OF 225MG      Psychiatry: Antidepressants - SNRI - desvenlafaxine & venlafaxine Failed - 08/26/2022  1:33 PM      Failed - Cr in normal range and within 360 days    Creatinine, Ser  Date Value Ref Range Status  01/20/2022 1.02 (H) 0.57 - 1.00 mg/dL Final         Failed - Valid encounter within last 6 months    Recent Outpatient Visits           7 months ago Routine general medical examination at a health care facility   [facility], DO   7 months ago Acute cough   [place] [facility] [place], [place] P, DO   1 year ago Varicose veins of both lower extremities with pain   [place] [facility] [place], Megan P, DO   1 year ago Plantar fasciitis of right foot   [place] [facility] [place], Megan P, DO   1 year ago Routine general medical examination at a health care facility   [facility] [place], [place] P, DO              Failed - Lipid Panel in normal range within the last 12 months    Cholesterol, Total  Date Value Ref Range Status  01/20/2022 160 100 - 199 mg/dL Final   LDL Chol Calc (NIH)  Date Value Ref Range Status  01/20/2022 98 0 - 99 mg/dL Final   HDL  Date Value Ref Range Status  01/20/2022 49 >39 mg/dL Final   Triglycerides  Date Value Ref Range Status  01/20/2022 66 0 -  149 mg/dL Final         Passed - Completed PHQ-2 or PHQ-9 in the last 360 days      Passed - Last BP in normal range    BP Readings from Last 1 Encounters:  01/20/22 121/77          buPROPion (WELLBUTRIN XL) 150 MG 24 hr tablet [Pharmacy Med Name: BUPROPION HCL XL 150 MG TABLET] 90 tablet 1    Sig: TAKE 1 TABLET BY MOUTH EVERY DAY     Psychiatry: Antidepressants - bupropion Failed - 08/26/2022  1:33 PM      Failed - Cr in normal range and within 360 days    Creatinine, Ser  Date Value Ref Range Status  01/20/2022 1.02 (H) 0.57 - 1.00 mg/dL Final         Failed - Valid encounter within last 6 months    Recent Outpatient Visits           7 months ago Routine general medical examination at a health care facility   [facility] [employer], [place] P, DO   7 months ago Acute cough   [place] [facility] [place],  Megan P, DO   1 year ago Varicose veins of both lower extremities with pain   [place] [facility] [place], [facility] P, DO   1 year ago Plantar fasciitis of right foot   [place] [facility] [place], Megan P, DO   1 year ago Routine general medical examination at a health care facility   [facility], DO              Passed - AST in normal range and within 360 days    AST  Date Value Ref Range Status  01/20/2022 13 0 - 40 IU/L Final         Passed - ALT in normal range and within 360 days    ALT  Date Value Ref Range Status  01/20/2022 15 0 - 32 IU/L Final         Passed - Completed PHQ-2 or PHQ-9 in the last 360 days      Passed - Last BP in normal range    BP Readings from Last 1 Encounters:  01/20/22 121/77          furosemide (LASIX) 40 MG tablet [Pharmacy Med Name: FUROSEMIDE 40 MG TABLET] 360 tablet 1    Sig: TAKE 2 TABLETS BY MOUTH TWICE A DAY     Cardiovascular:  Diuretics - Loop Failed - 08/26/2022  1:33 PM      Failed - K in normal  range and within 180 days    Potassium  Date Value Ref Range Status  01/20/2022 4.2 3.5 - 5.2 mmol/L Final         Failed - Ca in normal range and within 180 days    Calcium  Date Value Ref Range Status  01/20/2022 9.0 8.7 - 10.3 mg/dL Final         Failed - Na in normal range and within 180 days    Sodium  Date Value Ref Range Status  01/20/2022 144 134 - 144 mmol/L Final         Failed - Cr in normal range and within 180 days    Creatinine, Ser  Date Value Ref Range Status  01/20/2022 1.02 (H) 0.57 - 1.00 mg/dL Final         Failed - Cl in normal range and within 180 days    Chloride  Date Value Ref Range Status  01/20/2022 104 96 - 106 mmol/L Final         Failed - Mg Level in normal range and within 180 days    No results found for: "MG"       Failed - Valid encounter within last 6 months    Recent Outpatient Visits           7 months ago Routine general medical examination at a health care facility   [facility] [place], [facility] P, DO   7 months ago Acute cough   [place] [facility] [place], [place] P, DO   1 year ago Varicose veins of both lower extremities with pain   [place] [facility] [place], Megan P, DO   1 year ago Plantar fasciitis of right foot   [place] [facility] [place], Megan P, DO   1 year ago Routine general medical examination at a health care facility   [facility] [place], DO  Passed - Last BP in normal range    BP Readings from Last 1 Encounters:  01/20/22 121/77

## 2022-10-05 ENCOUNTER — Telehealth: Payer: Self-pay

## 2022-10-05 NOTE — Telephone Encounter (Signed)
LVM for patient to call back [phone], or to call PCP office to schedule follow up apt. AS, CMA  

## 2022-10-11 ENCOUNTER — Other Ambulatory Visit: Payer: Self-pay | Admitting: Family Medicine

## 2022-10-12 NOTE — Telephone Encounter (Signed)
Furosamide, Bupropion, venlafaxine(both doses)- all have had courtsey RF- 08/03/22- needs appointment Hydroxyzine- 08/04/22 #90- too soon Requested Prescriptions  Refused Prescriptions Disp Refills   furosemide (LASIX) 40 MG tablet [Pharmacy Med Name: FUROSEMIDE 40 MG TABLET] 120 tablet 0    Sig: TAKE 2 TABLETS BY MOUTH TWICE A DAY     Cardiovascular:  Diuretics - Loop Failed - 10/11/2022  7:35 PM      Failed - K in normal range and within 180 days    Potassium  Date Value Ref Range Status  01/20/2022 4.2 3.5 - 5.2 mmol/L Final         Failed - Ca in normal range and within 180 days    Calcium  Date Value Ref Range Status  01/20/2022 9.0 8.7 - 10.3 mg/dL Final         Failed - Na in normal range and within 180 days    Sodium  Date Value Ref Range Status  01/20/2022 144 134 - 144 mmol/L Final         Failed - Cr in normal range and within 180 days    Creatinine, Ser  Date Value Ref Range Status  01/20/2022 1.02 (H) 0.57 - 1.00 mg/dL Final         Failed - Cl in normal range and within 180 days    Chloride  Date Value Ref Range Status  01/20/2022 104 96 - 106 mmol/L Final         Failed - Mg Level in normal range and within 180 days    No results found for: "MG"       Failed - Valid encounter within last 6 months    Recent Outpatient Visits           8 months ago Routine general medical examination at a health care facility   [facility], DO   9 months ago Acute cough   [place] [facility] [place], [place] P, DO   1 year ago Varicose veins of both lower extremities with pain   [place] [facility] [place], Megan P, DO   1 year ago Plantar fasciitis of right foot   [place] [facility] [place], Megan P, DO   1 year ago Routine general medical examination at a health care facility   [facility], DO              Passed - Last BP in  normal range    BP Readings from Last 1 Encounters:  01/20/22 121/77          buPROPion (WELLBUTRIN XL) 150 MG 24 hr tablet [Pharmacy Med Name: BUPROPION HCL XL 150 MG TABLET] 30 tablet 0    Sig: TAKE 1 TABLET BY MOUTH EVERY DAY     Psychiatry: Antidepressants - bupropion Failed - 10/11/2022  7:35 PM      Failed - Cr in normal range and within 360 days    Creatinine, Ser  Date Value Ref Range Status  01/20/2022 1.02 (H) 0.57 - 1.00 mg/dL Final         Failed - Valid encounter within last 6 months    Recent Outpatient Visits           8 months ago Routine general medical examination at a health care facility   [facility], [place] P, DO   9 months ago Acute cough  [place] [facility] [place], [place] P, DO   1 year ago Varicose veins of both lower extremities with pain   [place] [facility] [place], [place] P, DO   1 year ago Plantar fasciitis of right foot   [place] [facility] [place], [place] P, DO   1 year ago Routine general medical examination at a health care facility   [facility], [facility] P, DO              Passed - AST in normal range and within 360 days    AST  Date Value Ref Range Status  01/20/2022 13 0 - 40 IU/L Final         Passed - ALT in normal range and within 360 days    ALT  Date Value Ref Range Status  01/20/2022 15 0 - 32 IU/L Final         Passed - Completed PHQ-2 or PHQ-9 in the last 360 days      Passed - Last BP in normal range    BP Readings from Last 1 Encounters:  01/20/22 121/77          venlafaxine XR (EFFEXOR-XR) 150 MG 24 hr capsule [Pharmacy Med Name: VENLAFAXINE HCL ER 150 MG CAP] 30 capsule 0    Sig: TAKE 1 CAPSULE BY MOUTH EVERY DAY WITH BREAKFAST WITH 75MG  FOR 225MG  DAILY     Psychiatry: Antidepressants - SNRI - desvenlafaxine & venlafaxine Failed - 10/11/2022  7:35 PM      Failed - Cr in normal range and within 360  days    Creatinine, Ser  Date Value Ref Range Status  01/20/2022 1.02 (H) 0.57 - 1.00 mg/dL Final         Failed - Valid encounter within last 6 months    Recent Outpatient Visits           8 months ago Routine general medical examination at a health care facility   [facility], [facility], DO   9 months ago Acute cough   [place] [facility] [place], [place] P, DO   1 year ago Varicose veins of both lower extremities with pain   [place] [facility] [place], Megan P, DO   1 year ago Plantar fasciitis of right foot   [place] [facility] [place], Megan P, DO   1 year ago Routine general medical examination at a health care facility   [facility] [place], [place] P, DO              Failed - Lipid Panel in normal range within the last 12 months    Cholesterol, Total  Date Value Ref Range Status  01/20/2022 160 100 - 199 mg/dL Final   LDL Chol Calc (NIH)  Date Value Ref Range Status  01/20/2022 98 0 - 99 mg/dL Final   HDL  Date Value Ref Range Status  01/20/2022 49 >39 mg/dL Final   Triglycerides  Date Value Ref Range Status  01/20/2022 66 0 - 149 mg/dL Final         Passed - Completed PHQ-2 or PHQ-9 in the last 360 days      Passed - Last BP in normal range    BP Readings from Last 1 Encounters:  01/20/22 121/77          venlafaxine XR (EFFEXOR-XR) 75 MG 24 hr capsule [Pharmacy  Med Name: VENLAFAXINE HCL ER 75 MG CAP] 30 capsule 0    Sig: TAKE 1 CAPSULE BY MOUTH EVERY DAY WITH BREAKFAST WITH 150MG  FOR TOTAL DAILY DOSE OF 225MG      Psychiatry: Antidepressants - SNRI - desvenlafaxine & venlafaxine Failed - 10/11/2022  7:35 PM      Failed - Cr in normal range and within 360 days    Creatinine, Ser  Date Value Ref Range Status  01/20/2022 1.02 (H) 0.57 - 1.00 mg/dL Final         Failed - Valid encounter within last 6 months    Recent Outpatient Visits            8 months ago Routine general medical examination at a health care facility   [facility] P, DO   9 months ago Acute cough   [place] [facility], [place] P, DO   1 year ago Varicose veins of both lower extremities with pain   [place] [facility] [place], Megan P, DO   1 year ago Plantar fasciitis of right foot   [place] [facility] [place], Megan P, DO   1 year ago Routine general medical examination at a health care facility   [facility] [place], [place] P, DO              Failed - Lipid Panel in normal range within the last 12 months    Cholesterol, Total  Date Value Ref Range Status  01/20/2022 160 100 - 199 mg/dL Final   LDL Chol Calc (NIH)  Date Value Ref Range Status  01/20/2022 98 0 - 99 mg/dL Final   HDL  Date Value Ref Range Status  01/20/2022 49 >39 mg/dL Final   Triglycerides  Date Value Ref Range Status  01/20/2022 66 0 - 149 mg/dL Final         Passed - Completed PHQ-2 or PHQ-9 in the last 360 days      Passed - Last BP in normal range    BP Readings from Last 1 Encounters:  01/20/22 121/77          hydrOXYzine (ATARAX) 25 MG tablet [Pharmacy Med Name: HYDROXYZINE HCL 25 MG TABLET] 90 tablet 0    Sig: TAKE 1 TABLET BY MOUTH EVERY DAY AT BEDTIME AS NEEDED     Ear, Nose, and Throat:  Antihistamines 2 Failed - 10/11/2022  7:35 PM      Failed - Cr in normal range and within 360 days    Creatinine, Ser  Date Value Ref Range Status  01/20/2022 1.02 (H) 0.57 - 1.00 mg/dL Final         Passed - Valid encounter within last 12 months    Recent Outpatient Visits           8 months ago Routine general medical examination at a health care facility   [facility], [facility], DO   9 months ago Acute cough   [place] [facility] [place], [place] P, DO   1 year ago Varicose veins of both lower  extremities with pain   [place] [facility] [place], Megan P, DO   1 year ago Plantar fasciitis of right foot   [place] [facility] [place], Megan P, DO   1 year ago Routine general medical examination at a health care facility   [place]  [place], Megan P, DO

## 2022-10-16 ENCOUNTER — Other Ambulatory Visit: Payer: Self-pay | Admitting: Family Medicine

## 2022-10-19 NOTE — Telephone Encounter (Signed)
Patient is overdue for an appointment. Please call to scheduled and then route to provider for refill.

## 2022-10-19 NOTE — Telephone Encounter (Signed)
Requested medications are due for refill today.  yes  Requested medications are on the active medications list.  yes  Last refill. varied  Future visit scheduled.   no  Notes to clinic.  Pt is over due for OV.     Requested Prescriptions  Pending Prescriptions Disp Refills   buPROPion (WELLBUTRIN XL) 150 MG 24 hr tablet [Pharmacy Med Name: BUPROPION HCL XL 150 MG TABLET] 30 tablet 0    Sig: TAKE 1 TABLET BY MOUTH EVERY DAY     Psychiatry: Antidepressants - bupropion Failed - 10/16/2022  8:20 PM      Failed - Cr in normal range and within 360 days    Creatinine, Ser  Date Value Ref Range Status  01/20/2022 1.02 (H) 0.57 - 1.00 mg/dL Final         Failed - Valid encounter within last 6 months    Recent Outpatient Visits           9 months ago Routine general medical examination at a health care facility   [facility], [facility], DO   9 months ago Acute cough   [place] [facility] [place], [place] P, DO   1 year ago Varicose veins of both lower extremities with pain   [place] [facility], Megan P, DO   1 year ago Plantar fasciitis of right foot   [place] [facility] [place], Megan P, DO   1 year ago Routine general medical examination at a health care facility   [facility], [facility] P, DO              Passed - AST in normal range and within 360 days    AST  Date Value Ref Range Status  01/20/2022 13 0 - 40 IU/L Final         Passed - ALT in normal range and within 360 days    ALT  Date Value Ref Range Status  01/20/2022 15 0 - 32 IU/L Final         Passed - Completed PHQ-2 or PHQ-9 in the last 360 days      Passed - Last BP in normal range    BP Readings from Last 1 Encounters:  01/20/22 121/77          furosemide (LASIX) 40 MG tablet [Pharmacy Med Name: FUROSEMIDE 40 MG TABLET] 120 tablet 0    Sig: TAKE 2 TABLETS BY MOUTH TWICE A DAY      Cardiovascular:  Diuretics - Loop Failed - 10/16/2022  8:20 PM      Failed - K in normal range and within 180 days    Potassium  Date Value Ref Range Status  01/20/2022 4.2 3.5 - 5.2 mmol/L Final         Failed - Ca in normal range and within 180 days    Calcium  Date Value Ref Range Status  01/20/2022 9.0 8.7 - 10.3 mg/dL Final         Failed - Na in normal range and within 180 days    Sodium  Date Value Ref Range Status  01/20/2022 144 134 - 144 mmol/L Final         Failed - Cr in normal range and within 180 days    Creatinine, Ser  Date Value Ref Range Status  01/20/2022 1.02 (H) 0.57 - 1.00 mg/dL Final  Failed - Cl in normal range and within 180 days    Chloride  Date Value Ref Range Status  01/20/2022 104 96 - 106 mmol/L Final         Failed - Mg Level in normal range and within 180 days    No results found for: "MG"       Failed - Valid encounter within last 6 months    Recent Outpatient Visits           9 months ago Routine general medical examination at a health care facility   [facility], [facility] P, DO   9 months ago Acute cough   [place] [facility] [place], [place] P, DO   1 year ago Varicose veins of both lower extremities with pain   [place] [facility] [place], Megan P, DO   1 year ago Plantar fasciitis of right foot   [place] [facility] [place], Megan P, DO   1 year ago Routine general medical examination at a health care facility   [facility], [facility] P, DO              Passed - Last BP in normal range    BP Readings from Last 1 Encounters:  01/20/22 121/77          venlafaxine XR (EFFEXOR-XR) 150 MG 24 hr capsule [Pharmacy Med Name: VENLAFAXINE HCL ER 150 MG CAP] 30 capsule 0    Sig: TAKE 1 CAPSULE BY MOUTH EVERY DAY WITH BREAKFAST WITH 75MG  FOR 225MG  DAILY     Psychiatry: Antidepressants - SNRI - desvenlafaxine &  venlafaxine Failed - 10/16/2022  8:20 PM      Failed - Cr in normal range and within 360 days    Creatinine, Ser  Date Value Ref Range Status  01/20/2022 1.02 (H) 0.57 - 1.00 mg/dL Final         Failed - Valid encounter within last 6 months    Recent Outpatient Visits           9 months ago Routine general medical examination at a health care facility   [facility], [facility] P, DO   9 months ago Acute cough   [place] [facility] [place], [place] P, DO   1 year ago Varicose veins of both lower extremities with pain   [place] [place], [facility] P, DO   1 year ago Plantar fasciitis of right foot   [place] [facility] [place], Megan P, DO   1 year ago Routine general medical examination at a health care facility   [facility] [place], [place] P, DO              Failed - Lipid Panel in normal range within the last 12 months    Cholesterol, Total  Date Value Ref Range Status  01/20/2022 160 100 - 199 mg/dL Final   LDL Chol Calc (NIH)  Date Value Ref Range Status  01/20/2022 98 0 - 99 mg/dL Final   HDL  Date Value Ref Range Status  01/20/2022 49 >39 mg/dL Final   Triglycerides  Date Value Ref Range Status  01/20/2022 66 0 - 149 mg/dL Final         Passed - Completed PHQ-2 or PHQ-9 in the last 360 days      Passed - Last BP in normal range  BP Readings from Last 1 Encounters:  01/20/22 121/77          hydrOXYzine (ATARAX) 25 MG tablet [Pharmacy Med Name: HYDROXYZINE HCL 25 MG TABLET] 90 tablet 0    Sig: TAKE 1 TABLET BY MOUTH EVERY DAY AT BEDTIME AS NEEDED     Ear, Nose, and Throat:  Antihistamines 2 Failed - 10/16/2022  8:20 PM      Failed - Cr in normal range and within 360 days    Creatinine, Ser  Date Value Ref Range Status  01/20/2022 1.02 (H) 0.57 - 1.00 mg/dL Final         Passed - Valid encounter within last 12 months    Recent Outpatient  Visits           9 months ago Routine general medical examination at a health care facility   [facility], DO   9 months ago Acute cough   [place] [facility] [place], [place] P, DO   1 year ago Varicose veins of both lower extremities with pain   [place] [facility] [place], Megan P, DO   1 year ago Plantar fasciitis of right foot   [place] [facility] [place], Megan P, DO   1 year ago Routine general medical examination at a health care facility   [facility], [facility] P, DO               venlafaxine XR (EFFEXOR-XR) 75 MG 24 hr capsule [Pharmacy Med Name: VENLAFAXINE HCL ER 75 MG CAP] 30 capsule 0    Sig: TAKE 1 CAPSULE BY MOUTH EVERY DAY WITH BREAKFAST WITH 150MG  FOR TOTAL DAILY DOSE OF 225MG      Psychiatry: Antidepressants - SNRI - desvenlafaxine & venlafaxine Failed - 10/16/2022  8:20 PM      Failed - Cr in normal range and within 360 days    Creatinine, Ser  Date Value Ref Range Status  01/20/2022 1.02 (H) 0.57 - 1.00 mg/dL Final         Failed - Valid encounter within last 6 months    Recent Outpatient Visits           9 months ago Routine general medical examination at a health care facility   [facility] P, DO   9 months ago Acute cough   [place] [facility] [place], [place] P, DO   1 year ago Varicose veins of both lower extremities with pain   [place] [facility] [place], Megan P, DO   1 year ago Plantar fasciitis of right foot   [place] [facility] [place], Megan P, DO   1 year ago Routine general medical examination at a health care facility   [facility] [place], [place] P, DO              Failed - Lipid Panel in normal range within the last 12 months    Cholesterol, Total  Date Value Ref Range Status  01/20/2022 160 100  - 199 mg/dL Final   LDL Chol Calc (NIH)  Date Value Ref Range Status  01/20/2022 98 0 - 99 mg/dL Final   HDL  Date Value Ref Range Status  01/20/2022 49 >39 mg/dL Final   Triglycerides  Date Value Ref Range Status  01/20/2022 66 0 - 149 mg/dL Final  Passed - Completed PHQ-2 or PHQ-9 in the last 360 days      Passed - Last BP in normal range    BP Readings from Last 1 Encounters:  01/20/22 121/77

## 2022-10-21 ENCOUNTER — Other Ambulatory Visit: Payer: Self-pay | Admitting: Family Medicine

## 2022-10-21 NOTE — Telephone Encounter (Signed)
Pt scheduled Dr Henriette Combs 1st , Oct 03.

## 2022-10-21 NOTE — Telephone Encounter (Signed)
Medication Refill - Medication: buPROPion HCl , Furosemide 80 mg, Venlafaxine HCl 150 MG , Venlafaxine HCl 75 MG , hydrOXYzine HCl 25 MG    Has the patient contacted their pharmacy? Yes.   (Pt needed an appt.  She has scheduled first available which is 10/03.  Can we send in enough to get her through? Preferred Pharmacy (with phone number or street name):  [id number] - [place], [place] - [address]   Has the patient been seen for an appointment in the last year OR does the patient have an upcoming appointment? Yes.    Agent: Please be advised that RX refills may take up to 3 business days. We ask that you follow-up with your pharmacy.

## 2022-10-22 ENCOUNTER — Other Ambulatory Visit: Payer: Self-pay | Admitting: Family Medicine

## 2022-10-22 MED ORDER — FUROSEMIDE 40 MG PO TABS: 80.0000 mg | ORAL_TABLET | Freq: Two times a day (BID) | ORAL | 1 refills | Status: DC

## 2022-10-22 MED ORDER — VENLAFAXINE HCL ER 150 MG PO CP24: ORAL_CAPSULE | ORAL | 1 refills | Status: DC

## 2022-10-22 MED ORDER — VENLAFAXINE HCL ER 75 MG PO CP24: 75.0000 mg | ORAL_CAPSULE | Freq: Every day | ORAL | 1 refills | Status: DC

## 2022-10-22 MED ORDER — BUPROPION HCL ER (XL) 150 MG PO TB24: 150.0000 mg | ORAL_TABLET | Freq: Every day | ORAL | 1 refills | Status: DC

## 2022-10-22 MED ORDER — HYDROXYZINE HCL 25 MG PO TABS: 25.0000 mg | ORAL_TABLET | Freq: Every evening | ORAL | 1 refills | Status: DC | PRN

## 2022-10-22 NOTE — Telephone Encounter (Signed)
Requested Prescriptions  Pending Prescriptions Disp Refills   buPROPion (WELLBUTRIN XL) 150 MG 24 hr tablet 30 tablet 1    Sig: Take 1 tablet (150 mg total) by mouth daily.     Psychiatry: Antidepressants - bupropion Failed - 10/21/2022 11:39 AM      Failed - Cr in normal range and within 360 days    Creatinine, Ser  Date Value Ref Range Status  01/20/2022 1.02 (H) 0.57 - 1.00 mg/dL Final         Failed - Valid encounter within last 6 months    Recent Outpatient Visits           9 months ago Routine general medical examination at a health care facility   [facility] P, DO   9 months ago Acute cough   [place] [facility] [place], [place] P, DO   1 year ago Varicose veins of both lower extremities with pain   [facility] [place], Megan P, DO   1 year ago Plantar fasciitis of right foot   [facility] [place], Megan P, DO   1 year ago Routine general medical examination at a health care facility   [facility] [place], [place] P, DO       Future Appointments             In 1 month Laural Benes, Oralia Rud, DO [place] [facility]            Passed - AST in normal range and within 360 days    AST  Date Value Ref Range Status  01/20/2022 13 0 - 40 IU/L Final         Passed - ALT in normal range and within 360 days    ALT  Date Value Ref Range Status  01/20/2022 15 0 - 32 IU/L Final         Passed - Completed PHQ-2 or PHQ-9 in the last 360 days      Passed - Last BP in normal range    BP Readings from Last 1 Encounters:  01/20/22 121/77          furosemide (LASIX) 40 MG tablet 60 tablet 1    Sig: Take 2 tablets (80 mg total) by mouth 2 (two) times daily.     Cardiovascular:  Diuretics - Loop Failed - 10/21/2022 11:39 AM      Failed - K in normal range and within 180 days    Potassium  Date Value Ref Range Status   01/20/2022 4.2 3.5 - 5.2 mmol/L Final         Failed - Ca in normal range and within 180 days    Calcium  Date Value Ref Range Status  01/20/2022 9.0 8.7 - 10.3 mg/dL Final         Failed - Na in normal range and within 180 days    Sodium  Date Value Ref Range Status  01/20/2022 144 134 - 144 mmol/L Final         Failed - Cr in normal range and within 180 days    Creatinine, Ser  Date Value Ref Range Status  01/20/2022 1.02 (H) 0.57 - 1.00 mg/dL Final         Failed - Cl in normal range and within 180 days    Chloride  Date Value Ref Range Status  01/20/2022 104 96 - 106  mmol/L Final         Failed - Mg Level in normal range and within 180 days    No results found for: "MG"       Failed - Valid encounter within last 6 months    Recent Outpatient Visits           9 months ago Routine general medical examination at a health care facility   [facility], DO   9 months ago Acute cough   [place] [facility] [place], [place] P, DO   1 year ago Varicose veins of both lower extremities with pain   [facility] [place], [facility] P, DO   1 year ago Plantar fasciitis of right foot   [facility] [place], Megan P, DO   1 year ago Routine general medical examination at a health care facility   [facility] [place], [place] P, DO       Future Appointments             In 1 month Johnson, Megan P, DO [place] [facility]            Passed - Last BP in normal range    BP Readings from Last 1 Encounters:  01/20/22 121/77          venlafaxine XR (EFFEXOR-XR) 150 MG 24 hr capsule 30 capsule 1    Sig: TAKE 1 CAPSULE BY MOUTH EVERY DAY WITH BREAKFAST WITH 75MG  FOR 225MG  DAILY     Psychiatry: Antidepressants - SNRI - desvenlafaxine & venlafaxine Failed - 10/21/2022 11:39 AM      Failed - Cr in normal range and within 360 days     Creatinine, Ser  Date Value Ref Range Status  01/20/2022 1.02 (H) 0.57 - 1.00 mg/dL Final         Failed - Valid encounter within last 6 months    Recent Outpatient Visits           9 months ago Routine general medical examination at a health care facility   [facility] Megan P, DO   9 months ago Acute cough   [place] [place], [place] P, DO   1 year ago Varicose veins of both lower extremities with pain   [place] [facility] [place], Megan P, DO   1 year ago Plantar fasciitis of right foot   [facility] [place], Megan P, DO   1 year ago Routine general medical examination at a health care facility   [facility] [place], [place] P, DO       Future Appointments             In 1 month Laural Benes, Megan P, DO [place] [facility]            Failed - Lipid Panel in normal range within the last 12 months    Cholesterol, Total  Date Value Ref Range Status  01/20/2022 160 100 - 199 mg/dL Final   LDL Chol Calc (NIH)  Date Value Ref Range Status  01/20/2022 98 0 - 99 mg/dL Final   HDL  Date Value Ref Range Status  01/20/2022 49 >39 mg/dL Final   Triglycerides  Date Value Ref Range Status  01/20/2022 66 0 - 149 mg/dL Final  Passed - Completed PHQ-2 or PHQ-9 in the last 360 days      Passed - Last BP in normal range    BP Readings from Last 1 Encounters:  01/20/22 121/77          venlafaxine XR (EFFEXOR-XR) 75 MG 24 hr capsule 30 capsule 1    Sig: 1 capsule (75 mg total) daily.     Psychiatry: Antidepressants - SNRI - desvenlafaxine & venlafaxine Failed - 10/21/2022 11:39 AM      Failed - Cr in normal range and within 360 days    Creatinine, Ser  Date Value Ref Range Status  01/20/2022 1.02 (H) 0.57 - 1.00 mg/dL Final         Failed - Valid encounter within last 6 months    Recent Outpatient Visits            9 months ago Routine general medical examination at a health care facility   [facility], DO   9 months ago Acute cough   [facility] [place], [place] P, DO   1 year ago Varicose veins of both lower extremities with pain   [place] [facility] [place], Megan P, DO   1 year ago Plantar fasciitis of right foot   [place] [facility] [place], Megan P, DO   1 year ago Routine general medical examination at a health care facility   [facility] [place], [place] P, DO       Future Appointments             In 1 month Laural Benes, Oralia Rud, DO [place] [facility]            Failed - Lipid Panel in normal range within the last 12 months    Cholesterol, Total  Date Value Ref Range Status  01/20/2022 160 100 - 199 mg/dL Final   LDL Chol Calc (NIH)  Date Value Ref Range Status  01/20/2022 98 0 - 99 mg/dL Final   HDL  Date Value Ref Range Status  01/20/2022 49 >39 mg/dL Final   Triglycerides  Date Value Ref Range Status  01/20/2022 66 0 - 149 mg/dL Final         Passed - Completed PHQ-2 or PHQ-9 in the last 360 days      Passed - Last BP in normal range    BP Readings from Last 1 Encounters:  01/20/22 121/77          hydrOXYzine (ATARAX) 25 MG tablet 30 tablet 1    Sig: Take 1 tablet (25 mg total) by mouth at bedtime as needed.     Ear, Nose, and Throat:  Antihistamines 2 Failed - 10/21/2022 11:39 AM      Failed - Cr in normal range and within 360 days    Creatinine, Ser  Date Value Ref Range Status  01/20/2022 1.02 (H) 0.57 - 1.00 mg/dL Final         Passed - Valid encounter within last 12 months    Recent Outpatient Visits           9 months ago Routine general medical examination at a health care facility   [facility], [place] P, DO   9 months ago Acute cough   [place] [facility] [place], [place] P, DO   1 year ago Varicose veins of both lower extremities  with pain   [place] [facility] [place], [place] P, DO   1 year ago Plantar fasciitis of right foot   [place] [place], [place] P, DO   1 year ago Routine general medical examination at a health care facility   [facility] Dorcas Carrow, DO       Future Appointments             In 1 month Laural Benes, Oralia Rud, DO [place] [facility]

## 2022-10-25 NOTE — Telephone Encounter (Signed)
Requested Prescriptions  Pending Prescriptions Disp Refills   albuterol (VENTOLIN HFA) 108 (90 Base) MCG/ACT inhaler [Pharmacy Med Name: ALBUTEROL HFA (VENTOLIN) INH] 18 each 0    Sig: TAKE 2 PUFFS BY MOUTH EVERY 6 HOURS AS NEEDED FOR WHEEZE OR SHORTNESS OF BREATH     Pulmonology:  Beta Agonists 2 Passed - 10/22/2022 11:49 AM      Passed - Last BP in normal range    BP Readings from Last 1 Encounters:  01/20/22 121/77         Passed - Last Heart Rate in normal range    Pulse Readings from Last 1 Encounters:  01/20/22 70         Passed - Valid encounter within last 12 months    Recent Outpatient Visits           9 months ago Routine general medical examination at a health care facility   [facility], [facility], DO   9 months ago Acute cough   [place] [facility] [place], [place] P, DO   1 year ago Varicose veins of both lower extremities with pain   [place] [facility] [place], Megan P, DO   1 year ago Plantar fasciitis of right foot   [place] [facility] [place], Megan P, DO   1 year ago Routine general medical examination at a health care facility   [facility] [place], [place] P, DO       Future Appointments             In 1 month Johnson, Megan P, DO [place] [facility]             venlafaxine XR (EFFEXOR-XR) 150 MG 24 hr capsule [Pharmacy Med Name: VENLAFAXINE HCL ER 150 MG CAP] 30 capsule 0    Sig: TAKE 1 CAPSULE BY MOUTH EVERY DAY WITH BREAKFAST WITH 75MG  FOR 225MG  DAILY     Psychiatry: Antidepressants - SNRI - desvenlafaxine & venlafaxine Failed - 10/22/2022 11:49 AM      Failed - Cr in normal range and within 360 days    Creatinine, Ser  Date Value Ref Range Status  01/20/2022 1.02 (H) 0.57 - 1.00 mg/dL Final         Failed - Valid encounter within last 6 months    Recent Outpatient Visits           9 months ago Routine general  medical examination at a health care facility   [facility], DO   9 months ago Acute cough   [place] [facility] [place], [place] P, DO   1 year ago Varicose veins of both lower extremities with pain   [place] [facility] [place], Megan P, DO   1 year ago Plantar fasciitis of right foot   [place] [facility] [place], Megan P, DO   1 year ago Routine general medical examination at a health care facility   [facility] [place], [place] P, DO       Future Appointments             In 1 month Johnson, Megan P, DO [place] [facility]            Failed - Lipid Panel in normal range within the last 12 months    Cholesterol, Total  Date Value Ref  Range Status  01/20/2022 160 100 - 199 mg/dL Final   LDL Chol Calc (NIH)  Date Value Ref Range Status  01/20/2022 98 0 - 99 mg/dL Final   HDL  Date Value Ref Range Status  01/20/2022 49 >39 mg/dL Final   Triglycerides  Date Value Ref Range Status  01/20/2022 66 0 - 149 mg/dL Final         Passed - Completed PHQ-2 or PHQ-9 in the last 360 days      Passed - Last BP in normal range    BP Readings from Last 1 Encounters:  01/20/22 121/77

## 2022-11-02 ENCOUNTER — Other Ambulatory Visit: Payer: Self-pay | Admitting: Family Medicine

## 2022-11-03 NOTE — Telephone Encounter (Signed)
Requested medication (s) are due for refill today:   Yes  Requested medication (s) are on the active medication list:   Yes  Future visit scheduled:   Yes 12/09/2022 with Dr. Laural Benes   Last ordered: 10/22/2022 #60, 1 refill as a courtesy until seen.  Returned because labs and an OV are due so unable to refill.  Provider to review for refill prior to upcoming appt.   Requested Prescriptions  Pending Prescriptions Disp Refills   furosemide (LASIX) 40 MG tablet [Pharmacy Med Name: FUROSEMIDE 40 MG TABLET] 360 tablet 1    Sig: TAKE 2 TABLETS BY MOUTH 2 TIMES DAILY.     Cardiovascular:  Diuretics - Loop Failed - 11/02/2022  8:31 AM      Failed - K in normal range and within 180 days    Potassium  Date Value Ref Range Status  01/20/2022 4.2 3.5 - 5.2 mmol/L Final         Failed - Ca in normal range and within 180 days    Calcium  Date Value Ref Range Status  01/20/2022 9.0 8.7 - 10.3 mg/dL Final         Failed - Na in normal range and within 180 days    Sodium  Date Value Ref Range Status  01/20/2022 144 134 - 144 mmol/L Final         Failed - Cr in normal range and within 180 days    Creatinine, Ser  Date Value Ref Range Status  01/20/2022 1.02 (H) 0.57 - 1.00 mg/dL Final         Failed - Cl in normal range and within 180 days    Chloride  Date Value Ref Range Status  01/20/2022 104 96 - 106 mmol/L Final         Failed - Mg Level in normal range and within 180 days    No results found for: "MG"       Failed - Valid encounter within last 6 months    Recent Outpatient Visits           9 months ago Routine general medical examination at a health care facility   [facility] P, DO   9 months ago Acute cough   [facility] [place], [place] P, DO   1 year ago Varicose veins of both lower extremities with pain   [place] [place], Megan P, DO   1 year ago Plantar fasciitis of right  foot   [place] [facility] [place], Megan P, DO   1 year ago Routine general medical examination at a health care facility   [facility] [place], [place] P, DO       Future Appointments             In 1 month Johnson, Megan P, DO [place] [facility]            Passed - Last BP in normal range    BP Readings from Last 1 Encounters:  01/20/22 121/77

## 2022-11-04 ENCOUNTER — Other Ambulatory Visit: Payer: Self-pay | Admitting: Family Medicine

## 2022-11-05 NOTE — Telephone Encounter (Signed)
Requested Prescriptions  Pending Prescriptions Disp Refills   atorvastatin (LIPITOR) 40 MG tablet [Pharmacy Med Name: ATORVASTATIN 40 MG TABLET] 90 tablet 0    Sig: TAKE 1 TABLET BY MOUTH EVERY DAY     Cardiovascular:  Antilipid - Statins Failed - 11/04/2022 12:49 PM      Failed - Lipid Panel in normal range within the last 12 months    Cholesterol, Total  Date Value Ref Range Status  01/20/2022 160 100 - 199 mg/dL Final   LDL Chol Calc (NIH)  Date Value Ref Range Status  01/20/2022 98 0 - 99 mg/dL Final   HDL  Date Value Ref Range Status  01/20/2022 49 >39 mg/dL Final   Triglycerides  Date Value Ref Range Status  01/20/2022 66 0 - 149 mg/dL Final         Passed - Patient is not pregnant      Passed - Valid encounter within last 12 months    Recent Outpatient Visits           9 months ago Routine general medical examination at a health care facility   [facility], Megan P, DO   9 months ago Acute cough   [facility] [place], [place] P, DO   1 year ago Varicose veins of both lower extremities with pain   [place] [facility] [place], Megan P, DO   1 year ago Plantar fasciitis of right foot   [place] [facility] [place], Megan P, DO   1 year ago Routine general medical examination at a health care facility   [facility] [place], [place] P, DO       Future Appointments             In 1 month Johnson, Megan P, DO [place] [facility]             cetirizine (ZYRTEC) 10 MG tablet [Pharmacy Med Name: CETIRIZINE HCL 10 MG TABLET] 90 tablet 0    Sig: TAKE 1 TABLET BY MOUTH EVERY DAY     Ear, Nose, and Throat:  Antihistamines 2 Failed - 11/04/2022 12:49 PM      Failed - Cr in normal range and within 360 days    Creatinine, Ser  Date Value Ref Range Status  01/20/2022 1.02 (H) 0.57 - 1.00 mg/dL Final         Passed - Valid encounter  within last 12 months    Recent Outpatient Visits           9 months ago Routine general medical examination at a health care facility   [facility], [facility], DO   9 months ago Acute cough   [place] [facility] [place], [place] P, DO   1 year ago Varicose veins of both lower extremities with pain   [place] [facility] [place], Megan P, DO   1 year ago Plantar fasciitis of right foot   [facility] [place], Megan P, DO   1 year ago Routine general medical examination at a health care facility   [facility] Dorcas Carrow, DO       Future Appointments             In 1 month Laural Benes, Oralia Rud, DO [place] [facility]  montelukast (SINGULAIR) 10 MG tablet [Pharmacy Med Name: MONTELUKAST SOD 10 MG TABLET] 90 tablet 0    Sig: TAKE 1 TABLET BY MOUTH EVERYDAY AT BEDTIME     Pulmonology:  Leukotriene Inhibitors Passed - 11/04/2022 12:49 PM      Passed - Valid encounter within last 12 months    Recent Outpatient Visits           9 months ago Routine general medical examination at a health care facility   [facility], Megan P, DO   9 months ago Acute cough   [place] [facility] [place], [place] P, DO   1 year ago Varicose veins of both lower extremities with pain   [place] [facility] [place], Megan P, DO   1 year ago Plantar fasciitis of right foot   [place] [facility] [place], Megan P, DO   1 year ago Routine general medical examination at a health care facility   [facility] [place], [place] P, DO       Future Appointments             In 1 month Johnson, Megan P, DO [place] [facility]             pantoprazole (PROTONIX) 40 MG tablet [Pharmacy Med Name: PANTOPRAZOLE SOD DR 40 MG TAB] 90 tablet 0     Sig: TAKE 1 TABLET BY MOUTH EVERY DAY     Gastroenterology: Proton Pump Inhibitors Passed - 11/04/2022 12:49 PM      Passed - Valid encounter within last 12 months    Recent Outpatient Visits           9 months ago Routine general medical examination at a health care facility   [facility], Megan P, DO   9 months ago Acute cough   [place] [facility] [place], [place] P, DO   1 year ago Varicose veins of both lower extremities with pain   [place] [facility] [place], Megan P, DO   1 year ago Plantar fasciitis of right foot   [facility] [place], Megan P, DO   1 year ago Routine general medical examination at a health care facility   [facility] Carrow, DO       Future Appointments             In 1 month Laural Benes, Oralia Rud, DO [place] [facility]             levothyroxine (SYNTHROID) 75 MCG tablet [Pharmacy Med Name: LEVOTHYROXINE 75 MCG TABLET] 90 tablet 0    Sig: TAKE 1 TABLET BY MOUTH EVERY DAY BEFORE BREAKFAST     Endocrinology:  Hypothyroid Agents Passed - 11/04/2022 12:49 PM      Passed - TSH in normal range and within 360 days    TSH  Date Value Ref Range Status  01/20/2022 0.725 0.450 - 4.500 uIU/mL Final         Passed - Valid encounter within last 12 months    Recent Outpatient Visits           9 months ago Routine general medical examination at a health care facility   [facility], [place] P, DO   9 months ago Acute cough   [place] [facility] [place], [place], DO  1 year ago Varicose veins of both lower extremities with pain   [place] [facility] [place], [place] P, DO   1 year ago Plantar fasciitis of right foot   [place] [facility] [place], Megan P, DO   1 year ago Routine general medical examination at a health care facility    [facility] Dorcas Carrow, DO       Future Appointments             In 1 month Laural Benes, Oralia Rud, DO [place] [facility]             busPIRone (BUSPAR) 5 MG tablet [Pharmacy Med Name: BUSPIRONE HCL 5 MG TABLET] 180 tablet 0    Sig: TAKE 1 TABLET BY MOUTH TWICE A DAY     Psychiatry: Anxiolytics/Hypnotics - Non-controlled Passed - 11/04/2022 12:49 PM      Passed - Valid encounter within last 12 months    Recent Outpatient Visits           9 months ago Routine general medical examination at a health care facility   [facility], Megan P, DO   9 months ago Acute cough   [place] [facility] [place], [place] P, DO   1 year ago Varicose veins of both lower extremities with pain   [place] [facility] [place], Megan P, DO   1 year ago Plantar fasciitis of right foot   [place] [facility] [place], Megan P, DO   1 year ago Routine general medical examination at a health care facility   [facility] Dor[facility] Carrow, DO       Future Appointments             In 1 month Laural Benes, Oralia Rud, DO  [facility]

## 2022-11-17 ENCOUNTER — Other Ambulatory Visit: Payer: Self-pay | Admitting: Family Medicine

## 2022-11-18 NOTE — Telephone Encounter (Signed)
Requested medication (s) are due for refill today:   Yes for all 4  Requested medication (s) are on the active medication list:   Yes for all 4  Future visit scheduled:   Yes 10/3 with Dr. Laural Benes   Last ordered: All ordered as courtesy refills on 10/22/2022 for #30, 1 refill with a note that an OV is needed.  Returned for provider review prior to her upcoming appt since courtesy refill has been given.     Requested Prescriptions  Pending Prescriptions Disp Refills   venlafaxine XR (EFFEXOR-XR) 75 MG 24 hr capsule [Pharmacy Med Name: VENLAFAXINE HCL ER 75 MG CAP] 90 capsule 1    Sig: TAKE 1 CAPSULE BY MOUTH EVERY DAY     Psychiatry: Antidepressants - SNRI - desvenlafaxine & venlafaxine Failed - 11/17/2022  8:31 AM      Failed - Cr in normal range and within 360 days    Creatinine, Ser  Date Value Ref Range Status  01/20/2022 1.02 (H) 0.57 - 1.00 mg/dL Final         Failed - Valid encounter within last 6 months    Recent Outpatient Visits           10 months ago Routine general medical examination at a health care facility   [facility], Megan P, DO   10 months ago Acute cough   [facility] [place], [place] P, DO   1 year ago Varicose veins of both lower extremities with pain   [place] [facility] [place], Megan P, DO   1 year ago Plantar fasciitis of right foot   [place] [facility] [place], Megan P, DO   1 year ago Routine general medical examination at a health care facility   [facility], [place] P, DO       Future Appointments             In 3 weeks Dorcas Carrow, DO [place] [facility]            Failed - Lipid Panel in normal range within the last 12 months    Cholesterol, Total  Date Value Ref Range Status  01/20/2022 160 100 - 199 mg/dL Final   LDL Chol Calc (NIH)  Date Value Ref Range Status  01/20/2022 98 0 -  99 mg/dL Final   HDL  Date Value Ref Range Status  01/20/2022 49 >39 mg/dL Final   Triglycerides  Date Value Ref Range Status  01/20/2022 66 0 - 149 mg/dL Final         Passed - Completed PHQ-2 or PHQ-9 in the last 360 days      Passed - Last BP in normal range    BP Readings from Last 1 Encounters:  01/20/22 121/77          buPROPion (WELLBUTRIN XL) 150 MG 24 hr tablet [Pharmacy Med Name: BUPROPION HCL XL 150 MG TABLET] 90 tablet 1    Sig: TAKE 1 TABLET BY MOUTH EVERY DAY     Psychiatry: Antidepressants - bupropion Failed - 11/17/2022  8:31 AM      Failed - Cr in normal range and within 360 days    Creatinine, Ser  Date Value Ref Range Status  01/20/2022 1.02 (H) 0.57 - 1.00 mg/dL Final         Failed - Valid encounter within last 6 months    Recent  Outpatient Visits           10 months ago Routine general medical examination at a health care facility   [facility] [place], [place] P, DO   10 months ago Acute cough   [place] [facility] [place], [place] P, DO   1 year ago Varicose veins of both lower extremities with pain   [facility] [facility] [place], [place] P, DO   1 year ago Plantar fasciitis of right foot   [place] [facility] [place], Megan P, DO   1 year ago Routine general medical examination at a health care facility   [facility], Oralia Rud, DO       Future Appointments             In 3 weeks Laural Benes, Oralia Rud, DO [place] [facility]            Passed - AST in normal range and within 360 days    AST  Date Value Ref Range Status  01/20/2022 13 0 - 40 IU/L Final         Passed - ALT in normal range and within 360 days    ALT  Date Value Ref Range Status  01/20/2022 15 0 - 32 IU/L Final         Passed - Completed PHQ-2 or PHQ-9 in the last 360 days      Passed - Last BP in normal range    BP Readings from Last 1  Encounters:  01/20/22 121/77          hydrOXYzine (ATARAX) 25 MG tablet [Pharmacy Med Name: HYDROXYZINE HCL 25 MG TABLET] 90 tablet 1    Sig: TAKE 1 TABLET BY MOUTH AT BEDTIME AS NEEDED.     Ear, Nose, and Throat:  Antihistamines 2 Failed - 11/17/2022  8:31 AM      Failed - Cr in normal range and within 360 days    Creatinine, Ser  Date Value Ref Range Status  01/20/2022 1.02 (H) 0.57 - 1.00 mg/dL Final         Passed - Valid encounter within last 12 months    Recent Outpatient Visits           10 months ago Routine general medical examination at a health care facility   [facility], Megan P, DO   10 months ago Acute cough   [place] [facility] [place], [place] P, DO   1 year ago Varicose veins of both lower extremities with pain   [place] [facility] [place], Megan P, DO   1 year ago Plantar fasciitis of right foot   [place] [facility] [place], Megan P, DO   1 year ago Routine general medical examination at a health care facility   [facility] [place], [place] P, DO       Future Appointments             In 3 weeks Laural Benes, Oralia Rud, DO [place] [facility], [facility]             venlafaxine XR (EFFEXOR-XR) 150 MG 24 hr capsule [Pharmacy Med Name: VENLAFAXINE HCL ER 150 MG CAP] 90 capsule 1    Sig: TAKE 1 CAPSULE BY MOUTH EVERY DAY WITH BREAKFAST WITH 75MG  FOR 225MG  DAILY     Psychiatry: Antidepressants - SNRI -  desvenlafaxine & venlafaxine Failed - 11/17/2022  8:31 AM      Failed - Cr in normal range and within 360 days    Creatinine, Ser  Date Value Ref Range Status  01/20/2022 1.02 (H) 0.57 - 1.00 mg/dL Final         Failed - Valid encounter within last 6 months    Recent Outpatient Visits           10 months ago Routine general medical examination at a health care facility   [facility], Megan P, DO   10  months ago Acute cough   [place] [facility] [place], [place] P, DO   1 year ago Varicose veins of both lower extremities with pain   [place] [place], Megan P, DO   1 year ago Plantar fasciitis of right foot   [place] [facility] [place], Megan P, DO   1 year ago Routine general medical examination at a health care facility   [facility] [place], Oralia Rud, DO       Future Appointments             In 3 weeks Dorcas Carrow, DO [place] [facility], [facility]            Failed - Lipid Panel in normal range within the last 12 months    Cholesterol, Total  Date Value Ref Range Status  01/20/2022 160 100 - 199 mg/dL Final   LDL Chol Calc (NIH)  Date Value Ref Range Status  01/20/2022 98 0 - 99 mg/dL Final   HDL  Date Value Ref Range Status  01/20/2022 49 >39 mg/dL Final   Triglycerides  Date Value Ref Range Status  01/20/2022 66 0 - 149 mg/dL Final         Passed - Completed PHQ-2 or PHQ-9 in the last 360 days      Passed - Last BP in normal range    BP Readings from Last 1 Encounters:  01/20/22 121/77

## 2022-12-09 ENCOUNTER — Encounter: Payer: Self-pay | Admitting: Family Medicine

## 2022-12-09 ENCOUNTER — Ambulatory Visit (INDEPENDENT_AMBULATORY_CARE_PROVIDER_SITE_OTHER): Payer: No Typology Code available for payment source | Admitting: Family Medicine

## 2022-12-09 VITALS — BP 121/79 | HR 68 | Ht 62.75 in | Wt 263.0 lb

## 2022-12-09 DIAGNOSIS — K529 Noninfective gastroenteritis and colitis, unspecified: Secondary | ICD-10-CM | POA: Diagnosis not present

## 2022-12-09 DIAGNOSIS — M329 Systemic lupus erythematosus, unspecified: Secondary | ICD-10-CM

## 2022-12-09 DIAGNOSIS — Z1211 Encounter for screening for malignant neoplasm of colon: Secondary | ICD-10-CM

## 2022-12-09 DIAGNOSIS — Z23 Encounter for immunization: Secondary | ICD-10-CM

## 2022-12-09 DIAGNOSIS — E782 Mixed hyperlipidemia: Secondary | ICD-10-CM | POA: Diagnosis not present

## 2022-12-09 DIAGNOSIS — G629 Polyneuropathy, unspecified: Secondary | ICD-10-CM | POA: Diagnosis not present

## 2022-12-09 DIAGNOSIS — I129 Hypertensive chronic kidney disease with stage 1 through stage 4 chronic kidney disease, or unspecified chronic kidney disease: Secondary | ICD-10-CM

## 2022-12-09 DIAGNOSIS — F419 Anxiety disorder, unspecified: Secondary | ICD-10-CM | POA: Diagnosis not present

## 2022-12-09 DIAGNOSIS — F331 Major depressive disorder, recurrent, moderate: Secondary | ICD-10-CM

## 2022-12-09 DIAGNOSIS — D6869 Other thrombophilia: Secondary | ICD-10-CM

## 2022-12-09 DIAGNOSIS — E559 Vitamin D deficiency, unspecified: Secondary | ICD-10-CM | POA: Diagnosis not present

## 2022-12-09 DIAGNOSIS — E063 Autoimmune thyroiditis: Secondary | ICD-10-CM | POA: Diagnosis not present

## 2022-12-09 MED ORDER — GABAPENTIN 300 MG PO CAPS: 600.0000 mg | ORAL_CAPSULE | Freq: Two times a day (BID) | ORAL | 1 refills | Status: DC

## 2022-12-09 MED ORDER — BUSPIRONE HCL 5 MG PO TABS: 5.0000 mg | ORAL_TABLET | Freq: Two times a day (BID) | ORAL | 1 refills | Status: DC

## 2022-12-09 MED ORDER — CETIRIZINE HCL 10 MG PO TABS: 10.0000 mg | ORAL_TABLET | Freq: Every day | ORAL | 1 refills | Status: DC

## 2022-12-09 MED ORDER — HYDROXYZINE HCL 25 MG PO TABS: 25.0000 mg | ORAL_TABLET | Freq: Every evening | ORAL | 1 refills | Status: DC | PRN

## 2022-12-09 MED ORDER — POTASSIUM CHLORIDE CRYS ER 20 MEQ PO TBCR: 20.0000 meq | EXTENDED_RELEASE_TABLET | Freq: Every day | ORAL | 1 refills | Status: DC

## 2022-12-09 MED ORDER — CHOLESTYRAMINE 4 G PO PACK: 4.0000 g | PACK | Freq: Three times a day (TID) | ORAL | 3 refills | Status: DC

## 2022-12-09 MED ORDER — FLUTICASONE PROPIONATE 50 MCG/ACT NA SUSP: NASAL | 6 refills | Status: DC

## 2022-12-09 MED ORDER — AMLODIPINE BESYLATE 10 MG PO TABS: 10.0000 mg | ORAL_TABLET | Freq: Every day | ORAL | 1 refills | Status: DC

## 2022-12-09 MED ORDER — ATORVASTATIN CALCIUM 40 MG PO TABS: 40.0000 mg | ORAL_TABLET | Freq: Every day | ORAL | 1 refills | Status: DC

## 2022-12-09 MED ORDER — BACLOFEN 10 MG PO TABS: 10.0000 mg | ORAL_TABLET | Freq: Three times a day (TID) | ORAL | 1 refills | Status: DC

## 2022-12-09 MED ORDER — APIXABAN 5 MG PO TABS: 5.0000 mg | ORAL_TABLET | Freq: Two times a day (BID) | ORAL | 1 refills | Status: DC

## 2022-12-09 MED ORDER — PANTOPRAZOLE SODIUM 40 MG PO TBEC: 40.0000 mg | DELAYED_RELEASE_TABLET | Freq: Every day | ORAL | 1 refills | Status: DC

## 2022-12-09 MED ORDER — BUPROPION HCL ER (XL) 150 MG PO TB24: 150.0000 mg | ORAL_TABLET | Freq: Every day | ORAL | 1 refills | Status: DC

## 2022-12-09 MED ORDER — ALBUTEROL SULFATE HFA 108 (90 BASE) MCG/ACT IN AERS: 1.0000 | INHALATION_SPRAY | RESPIRATORY_TRACT | 6 refills | Status: DC | PRN

## 2022-12-09 MED ORDER — FUROSEMIDE 40 MG PO TABS: 80.0000 mg | ORAL_TABLET | Freq: Two times a day (BID) | ORAL | 1 refills | Status: DC

## 2022-12-09 MED ORDER — VENLAFAXINE HCL ER 75 MG PO CP24: 75.0000 mg | ORAL_CAPSULE | Freq: Every day | ORAL | 1 refills | Status: DC

## 2022-12-09 MED ORDER — MONTELUKAST SODIUM 10 MG PO TABS: 10.0000 mg | ORAL_TABLET | Freq: Every day | ORAL | 1 refills | Status: DC

## 2022-12-09 MED ORDER — VENLAFAXINE HCL ER 150 MG PO CP24: ORAL_CAPSULE | ORAL | 1 refills | Status: DC

## 2022-12-09 NOTE — Progress Notes (Signed)
BP 121/79   Pulse 68   Ht 5' 2.75" (1.594 m)   Wt 263 lb (119.3 kg)   SpO2 99%   BMI 46.96 kg/m    Subjective:    Patient ID: Kaylee Fox, female    DOB: 09-16-1960, 62 y.o.   MRN: [id number]  HPI: Kaylee Fox is a 62 y.o. female  Chief Complaint  Patient presents with   Abdominal Pain    Started about 2 months ago, occurs about 30 minutes after eating   Depression   Hypertension   Hyperlipidemia   Hypothyroidism   HYPERTENSION / HYPERLIPIDEMIA Satisfied with current treatment? yes Duration of hypertension: chronic BP monitoring frequency: not checking BP medication side effects: no Past BP meds: amlodipine, lasix Duration of hyperlipidemia: chronic Cholesterol medication side effects: no Cholesterol supplements: none Past cholesterol medications: atorvastatin Medication compliance: excellent compliance Aspirin: no Recent stressors: no Recurrent headaches: no Visual changes: no Palpitations: no Dyspnea: no Chest pain: no Lower extremity edema: no Dizzy/lightheaded: no  HYPOTHYROIDISM Thyroid control status:controlled Satisfied with current treatment? yes Medication side effects: no Medication compliance: excellent compliance Recent dose adjustment:no Fatigue: no Cold intolerance: no Heat intolerance: no Weight gain: no Weight loss: no Constipation: no Diarrhea/loose stools: no Palpitations: no Lower extremity edema: no Anxiety/depressed mood: no  ABDOMINAL ISSUES Duration: chronic Nature: bloating and cramping Location: diffuse  Severity: moderate  Radiation: no Episode duration: Frequency: intermittent Alleviating factors: nothing Aggravating factors: eating Treatments attempted: antacids, PPI, H2 Blocker, and laxatives Constipation: no Diarrhea: yes Episodes of diarrhea/day: with eating Mucous in the stool: no Heartburn: no Bloating:no Flatulence: no Nausea: no Vomiting: no Melena or hematochezia: no Rash: no Jaundice:  no Fever: no Weight loss: no  DEPRESSION Mood status: controlled Satisfied with current treatment?: yes Symptom severity: mild  Duration of current treatment : chronic Side effects: no Medication compliance: excellent compliance Psychotherapy/counseling: no  Previous psychiatric medications: effexor Depressed mood: no Anxious mood: no Anhedonia: no Significant weight loss or gain: no Insomnia: no  Fatigue: no Feelings of worthlessness or guilt: no Impaired concentration/indecisiveness: no Suicidal ideations: no Hopelessness: no Crying spells: no    12/09/2022    9:49 AM 01/20/2022    3:43 PM 01/15/2022    2:37 PM 12/10/2021   12:12 PM 07/17/2021    3:26 PM  Depression screen PHQ 2/9  Decreased Interest 1 0 2 1 2   Down, Depressed, Hopeless 0 0 0 2 0  PHQ - 2 Score 1 0 2 3 2   Altered sleeping 3 0 2 3 2   Tired, decreased energy 3 2 3 3 2   Change in appetite 0 0 2 1 2   Feeling bad or failure about yourself  0 0 0 0 1  Trouble concentrating 0 0 0 0 2  Moving slowly or fidgety/restless 1 0 0 0 1  Suicidal thoughts 0 0 0 0 0  PHQ-9 Score 8 2 9 10 12   Difficult doing work/chores Not difficult at all  Very difficult Somewhat difficult Not difficult at all    Relevant past medical, surgical, family and social history reviewed and updated as indicated. Interim medical history since our last visit reviewed. Allergies and medications reviewed and updated.  Review of Systems  Constitutional:  Positive for fatigue. Negative for activity change, appetite change, chills, diaphoresis, fever and unexpected weight change.  Respiratory: Negative.    Cardiovascular: Negative.   Gastrointestinal:  Positive for abdominal pain and diarrhea. Negative for abdominal distention, anal bleeding, blood in stool, constipation, nausea, rectal  pain and vomiting.  Musculoskeletal: Negative.   Skin: Negative.   Neurological:  Positive for numbness. Negative for dizziness, tremors, seizures, syncope,  facial asymmetry, speech difficulty, weakness, light-headedness and headaches.  Psychiatric/Behavioral: Negative.      Per HPI unless specifically indicated above     Objective:    BP 121/79   Pulse 68   Ht 5' 2.75" (1.594 m)   Wt 263 lb (119.3 kg)   SpO2 99%   BMI 46.96 kg/m   Wt Readings from Last 3 Encounters:  12/09/22 263 lb (119.3 kg)  01/20/22 268 lb 11.2 oz (121.9 kg)  01/15/22 268 lb 8 oz (121.8 kg)    Physical Exam Vitals and nursing note reviewed.  Constitutional:      General: She is not in acute distress.    Appearance: Normal appearance. She is well-developed. She is obese. She is not ill-appearing, toxic-appearing or diaphoretic.  HENT:     Head: Normocephalic and atraumatic.     Right Ear: External ear normal.     Left Ear: External ear normal.     Nose: Nose normal.     Mouth/Throat:     Mouth: Mucous membranes are moist.     Pharynx: Oropharynx is clear.  Eyes:     General: No scleral icterus.       Right eye: No discharge.        Left eye: No discharge.     Extraocular Movements: Extraocular movements intact.     Conjunctiva/sclera: Conjunctivae normal.     Pupils: Pupils are equal, round, and reactive to light.  Cardiovascular:     Rate and Rhythm: Normal rate and regular rhythm.     Pulses: Normal pulses.     Heart sounds: Normal heart sounds. No murmur heard.    No friction rub. No gallop.  Pulmonary:     Effort: Pulmonary effort is normal. No respiratory distress.     Breath sounds: Normal breath sounds. No stridor. No wheezing, rhonchi or rales.  Chest:     Chest wall: No tenderness.  Abdominal:     General: Abdomen is protuberant. Bowel sounds are normal. There is no distension or abdominal bruit. There are no signs of injury.     Palpations: Abdomen is soft.     Tenderness: There is no abdominal tenderness.  Musculoskeletal:        General: Normal range of motion.     Cervical back: Normal range of motion and neck supple.  Skin:     General: Skin is warm and dry.     Capillary Refill: Capillary refill takes less than 2 seconds.     Coloration: Skin is not jaundiced or pale.     Findings: No bruising, erythema, lesion or rash.  Neurological:     General: No focal deficit present.     Mental Status: She is alert and oriented to person, place, and time. Mental status is at baseline.  Psychiatric:        Mood and Affect: Mood normal.        Behavior: Behavior normal.        Thought Content: Thought content normal.        Judgment: Judgment normal.     Results for orders placed or performed in visit on 12/09/22  CBC with Differential/Platelet  Result Value Ref Range   WBC 4.3 3.4 - 10.8 x10E3/uL   RBC 4.04 3.77 - 5.28 x10E6/uL   Hemoglobin 12.7 11.1 - 15.9 g/dL  Hematocrit 40.2 34.0 - 46.6 %   MCV 100 (H) 79 - 97 fL   MCH 31.4 26.6 - 33.0 pg   MCHC 31.6 31.5 - 35.7 g/dL   RDW 16.1 09.6 - 04.5 %   Platelets 234 150 - 450 x10E3/uL   Neutrophils 60 Not Estab. %   Lymphs 30 Not Estab. %   Monocytes 8 Not Estab. %   Eos 0 Not Estab. %   Basos 1 Not Estab. %   Neutrophils Absolute 2.6 1.4 - 7.0 x10E3/uL   Lymphocytes Absolute 1.3 0.7 - 3.1 x10E3/uL   Monocytes Absolute 0.3 0.1 - 0.9 x10E3/uL   EOS (ABSOLUTE) 0.0 0.0 - 0.4 x10E3/uL   Basophils Absolute 0.0 0.0 - 0.2 x10E3/uL   Immature Granulocytes 1 Not Estab. %   Immature Grans (Abs) 0.0 0.0 - 0.1 x10E3/uL  Comprehensive metabolic panel  Result Value Ref Range   Glucose 95 70 - 99 mg/dL   BUN 12 8 - 27 mg/dL   Creatinine, Ser 4.09 0.57 - 1.00 mg/dL   eGFR 83 >81 XB/JYN/8.29   BUN/Creatinine Ratio 15 12 - 28   Sodium 144 134 - 144 mmol/L   Potassium 4.3 3.5 - 5.2 mmol/L   Chloride 106 96 - 106 mmol/L   CO2 27 20 - 29 mmol/L   Calcium 8.9 8.7 - 10.3 mg/dL   Total Protein 6.7 6.0 - 8.5 g/dL   Albumin 4.0 3.9 - 4.9 g/dL   Globulin, Total 2.7 1.5 - 4.5 g/dL   Bilirubin Total 0.4 0.0 - 1.2 mg/dL   Alkaline Phosphatase 121 44 - 121 IU/L   AST 22 0 - 40  IU/L   ALT 15 0 - 32 IU/L  Hgb A1c w/o eAG  Result Value Ref Range   Hgb A1c MFr Bld 5.3 4.8 - 5.6 %  VITAMIN D 25 Hydroxy (Vit-D Deficiency, Fractures)  Result Value Ref Range   Vit D, 25-Hydroxy 23.7 (L) 30.0 - 100.0 ng/mL  TSH  Result Value Ref Range   TSH 2.230 0.450 - 4.500 uIU/mL  Lipid Panel w/o Chol/HDL Ratio  Result Value Ref Range   Cholesterol, Total 174 100 - 199 mg/dL   Triglycerides 562 0 - 149 mg/dL   HDL 49 >13 mg/dL   VLDL Cholesterol Cal 21 5 - 40 mg/dL   LDL Chol Calc (NIH) 086 (H) 0 - 99 mg/dL  V78  Result Value Ref Range   Vitamin B-12 346 232 - 1,245 pg/mL      Assessment & Plan:   Problem List Items Addressed This Visit       Endocrine   Autoimmune hypothyroidism    Rechecking labs today. Await results. Treat as needed.       Relevant Orders   CBC with Differential/Platelet (Completed)   Comprehensive metabolic panel (Completed)   TSH (Completed)     Genitourinary   Benign hypertensive renal disease    Under good control on current regimen. Continue current regimen. Continue to monitor. Call with any concerns. Refills given. Labs drawn today.        Relevant Orders   CBC with Differential/Platelet (Completed)   Comprehensive metabolic panel (Completed)     Hematopoietic and Hemostatic   Acquired thrombophilia (HCC)    Stable. Continue to monitor. Labs drawn today.         Other   Depression    Under good control on current regimen. Continue current regimen. Continue to monitor. Call with any concerns. Refills given.  Relevant Medications   buPROPion (WELLBUTRIN XL) 150 MG 24 hr tablet   busPIRone (BUSPAR) 5 MG tablet   hydrOXYzine (ATARAX) 25 MG tablet   venlafaxine XR (EFFEXOR-XR) 150 MG 24 hr capsule   venlafaxine XR (EFFEXOR-XR) 75 MG 24 hr capsule   Other Relevant Orders   CBC with Differential/Platelet (Completed)   Comprehensive metabolic panel (Completed)   Anxiety    Under good control on current regimen.  Continue current regimen. Continue to monitor. Call with any concerns. Refills given.       Relevant Medications   buPROPion (WELLBUTRIN XL) 150 MG 24 hr tablet   busPIRone (BUSPAR) 5 MG tablet   hydrOXYzine (ATARAX) 25 MG tablet   venlafaxine XR (EFFEXOR-XR) 150 MG 24 hr capsule   venlafaxine XR (EFFEXOR-XR) 75 MG 24 hr capsule   Other Relevant Orders   CBC with Differential/Platelet (Completed)   Comprehensive metabolic panel (Completed)   Vitamin D deficiency    Rechecking labs today. Await results. Treat as needed.       Relevant Orders   CBC with Differential/Platelet (Completed)   Comprehensive metabolic panel (Completed)   Lupus (systemic lupus erythematosus) (HCC)    Stable. Continue to follow with rheumatology. Call with any concerns.       Relevant Orders   CBC with Differential/Platelet (Completed)   Comprehensive metabolic panel (Completed)   Morbid obesity (HCC)    Encouraged diet and exercise with goal of losing 1-2lbs per week. Call with any concerns.       Mixed hyperlipidemia    Under good control on current regimen. Continue current regimen. Continue to monitor. Call with any concerns. Refills given. Labs drawn today.        Relevant Medications   cholestyramine (QUESTRAN) 4 g packet   amLODipine (NORVASC) 10 MG tablet   atorvastatin (LIPITOR) 40 MG tablet   apixaban (ELIQUIS) 5 MG TABS tablet   furosemide (LASIX) 40 MG tablet   Other Relevant Orders   CBC with Differential/Platelet (Completed)   Comprehensive metabolic panel (Completed)   Lipid Panel w/o Chol/HDL Ratio (Completed)   Other Visit Diagnoses     Chronic diarrhea    -  Primary   Due for colonoscopy- ordered. Will check labs and start her on cholestyramine. Recheck in about 3 months. Call with any concerns.   Neuropathy       Will check labs today. Await results. Treat as needed.   Relevant Orders   CBC with Differential/Platelet (Completed)   Comprehensive metabolic panel  (Completed)   Hgb A1c w/o eAG (Completed)   VITAMIN D 25 Hydroxy (Vit-D Deficiency, Fractures) (Completed)   B12 (Completed)   Screening for colon cancer       Referral to GI placed today.   Relevant Orders   Ambulatory referral to Gastroenterology   Needs flu shot       Flu shot given today.   Relevant Orders   Flu vaccine trivalent PF, 6mos and older(Flulaval,Afluria,Fluarix,Fluzone) (Completed)        Follow up plan: Return in about 3 months (around 03/11/2023) for physical- before end of year please.

## 2022-12-10 ENCOUNTER — Other Ambulatory Visit: Payer: Self-pay

## 2022-12-10 ENCOUNTER — Telehealth: Payer: Self-pay

## 2022-12-10 DIAGNOSIS — Z860100 Personal history of colon polyps, unspecified: Secondary | ICD-10-CM

## 2022-12-10 LAB — CBC WITH DIFFERENTIAL/PLATELET
Basophils Absolute: 0 10*3/uL (ref 0.0–0.2)
Basos: 1 %
EOS (ABSOLUTE): 0 10*3/uL (ref 0.0–0.4)
Eos: 0 %
Hematocrit: 40.2 % (ref 34.0–46.6)
Hemoglobin: 12.7 g/dL (ref 11.1–15.9)
Immature Grans (Abs): 0 10*3/uL (ref 0.0–0.1)
Immature Granulocytes: 1 %
Lymphocytes Absolute: 1.3 10*3/uL (ref 0.7–3.1)
Lymphs: 30 %
MCH: 31.4 pg (ref 26.6–33.0)
MCHC: 31.6 g/dL (ref 31.5–35.7)
MCV: 100 fL — ABNORMAL HIGH (ref 79–97)
Monocytes Absolute: 0.3 10*3/uL (ref 0.1–0.9)
Monocytes: 8 %
Neutrophils Absolute: 2.6 10*3/uL (ref 1.4–7.0)
Neutrophils: 60 %
Platelets: 234 10*3/uL (ref 150–450)
RBC: 4.04 x10E6/uL (ref 3.77–5.28)
RDW: 12 % (ref 11.7–15.4)
WBC: 4.3 10*3/uL (ref 3.4–10.8)

## 2022-12-10 LAB — COMPREHENSIVE METABOLIC PANEL
ALT: 15 [IU]/L (ref 0–32)
AST: 22 [IU]/L (ref 0–40)
Albumin: 4 g/dL (ref 3.9–4.9)
Alkaline Phosphatase: 121 [IU]/L (ref 44–121)
BUN/Creatinine Ratio: 15 (ref 12–28)
BUN: 12 mg/dL (ref 8–27)
Bilirubin Total: 0.4 mg/dL (ref 0.0–1.2)
CO2: 27 mmol/L (ref 20–29)
Calcium: 8.9 mg/dL (ref 8.7–10.3)
Chloride: 106 mmol/L (ref 96–106)
Creatinine, Ser: 0.81 mg/dL (ref 0.57–1.00)
Globulin, Total: 2.7 g/dL (ref 1.5–4.5)
Glucose: 95 mg/dL (ref 70–99)
Potassium: 4.3 mmol/L (ref 3.5–5.2)
Sodium: 144 mmol/L (ref 134–144)
Total Protein: 6.7 g/dL (ref 6.0–8.5)
eGFR: 83 mL/min/{1.73_m2} (ref >59)

## 2022-12-10 LAB — VITAMIN D 25 HYDROXY (VIT D DEFICIENCY, FRACTURES): Vit D, 25-Hydroxy: 23.7 ng/mL — ABNORMAL LOW (ref 30.0–100.0)

## 2022-12-10 LAB — HGB A1C W/O EAG: Hgb A1c MFr Bld: 5.3 % (ref 4.8–5.6)

## 2022-12-10 LAB — LIPID PANEL W/O CHOL/HDL RATIO
Cholesterol, Total: 174 mg/dL (ref 100–199)
HDL: 49 mg/dL (ref >39)
LDL Chol Calc (NIH): 104 mg/dL — ABNORMAL HIGH (ref 0–99)
Triglycerides: 118 mg/dL (ref 0–149)
VLDL Cholesterol Cal: 21 mg/dL (ref 5–40)

## 2022-12-10 LAB — VITAMIN B12: Vitamin B-12: 346 pg/mL (ref 232–1245)

## 2022-12-10 LAB — TSH: TSH: 2.23 u[IU]/mL (ref 0.450–4.500)

## 2022-12-10 MED ORDER — NA SULFATE-K SULFATE-MG SULF 17.5-3.13-1.6 GM/177ML PO SOLN
1.0000 | Freq: Once | ORAL | 0 refills | Status: AC
Start: 2022-12-10 — End: 2022-12-10

## 2022-12-10 NOTE — Telephone Encounter (Signed)
Gastroenterology Pre-Procedure Review  Request Date: 01/12/23 Requesting Physician: Dr. Tobi Bastos  PATIENT REVIEW QUESTIONS: The patient responded to the following health history questions as indicated:    1. Are you having any GI issues? no 2. Do you have a personal history of Polyps? yes (last colonsocopy was with Dr. Tobi Bastos 07/16/16) 3. Do you have a family history of Colon Cancer or Polyps? no 4. Diabetes Mellitus? no 5. Joint replacements in the past 12 months?no 6. Major health problems in the past 3 months?no 7. Any artificial heart valves, MVP, or defibrillator?no    MEDICATIONS & ALLERGIES:    Patient reports the following regarding taking any anticoagulation/antiplatelet therapy:   Plavix, Coumadin, Eliquis, Xarelto, Lovenox, Pradaxa, Brilinta, or Effient? yes (Eliquis blood thinner advice sent to PCP) Aspirin? no  Patient confirms/reports the following medications:  Current Outpatient Medications  Medication Sig Dispense Refill   acetaminophen (TYLENOL) 325 MG tablet Take by mouth as needed.      albuterol (VENTOLIN HFA) 108 (90 Base) MCG/ACT inhaler Inhale 1-2 puffs into the lungs every 4 (four) hours as needed for wheezing or shortness of breath. 18 each 6   amLODipine (NORVASC) 10 MG tablet Take 1 tablet (10 mg total) by mouth daily. 90 tablet 1   apixaban (ELIQUIS) 5 MG TABS tablet Take 1 tablet (5 mg total) by mouth 2 (two) times daily. 180 tablet 1   atorvastatin (LIPITOR) 40 MG tablet Take 1 tablet (40 mg total) by mouth daily. 90 tablet 1   azaTHIOprine (IMURAN) 50 MG tablet Take 100 mg by mouth daily.     baclofen (LIORESAL) 10 MG tablet Take 1 tablet (10 mg total) by mouth 3 (three) times daily. 270 tablet 1   buPROPion (WELLBUTRIN XL) 150 MG 24 hr tablet Take 1 tablet (150 mg total) by mouth daily. 90 tablet 1   busPIRone (BUSPAR) 5 MG tablet Take 1 tablet (5 mg total) by mouth 2 (two) times daily. 180 tablet 1   calcium carbonate (OS-CAL) 600 MG TABS tablet Take by  mouth.     cetirizine (ZYRTEC) 10 MG tablet Take 1 tablet (10 mg total) by mouth daily. 90 tablet 1   Cholecalciferol 25 MCG (1000 UT) capsule Take 1,000 Units by mouth daily. Takes 2 1000 mg per day     cholestyramine (QUESTRAN) 4 g packet Take 1 packet (4 g total) by mouth 3 (three) times daily with meals. 60 each 3   diclofenac Sodium (VOLTAREN) 1 % GEL Apply 4 g topically 4 (four) times daily. 100 g 3   fluticasone (FLONASE) 50 MCG/ACT nasal spray USE 2 SPRAYS INTO BOTH NOSTRILS DAILY. 48 g 6   furosemide (LASIX) 40 MG tablet Take 2 tablets (80 mg total) by mouth 2 (two) times daily. 180 tablet 1   gabapentin (NEURONTIN) 300 MG capsule Take 2 capsules (600 mg total) by mouth 2 (two) times daily. 360 capsule 1   hydroxychloroquine (PLAQUENIL) 200 MG tablet Take 400 mg by mouth 2 (two) times daily.      hydrOXYzine (ATARAX) 25 MG tablet Take 1 tablet (25 mg total) by mouth at bedtime as needed. 90 tablet 1   levothyroxine (SYNTHROID) 75 MCG tablet TAKE 1 TABLET BY MOUTH EVERY DAY BEFORE BREAKFAST 90 tablet 0   montelukast (SINGULAIR) 10 MG tablet Take 1 tablet (10 mg total) by mouth at bedtime. 90 tablet 1   Multiple Vitamin (MULTI-VITAMINS) TABS Take by mouth.     mupirocin cream (BACTROBAN) 2 % Apply to affected  area on arms/legs twice a day as needed.     nystatin (MYCOSTATIN) 100000 UNIT/ML suspension Take 5 mLs (500,000 Units total) by mouth 4 (four) times daily. 473 mL 0   pantoprazole (PROTONIX) 40 MG tablet Take 1 tablet (40 mg total) by mouth daily. 90 tablet 1   potassium chloride SA (KLOR-CON M20) 20 MEQ tablet Take 1 tablet (20 mEq total) by mouth daily. 90 tablet 1   triamcinolone cream (KENALOG) 0.1 % Apply 1 Application topically 2 (two) times daily. 30 g 0   venlafaxine XR (EFFEXOR-XR) 150 MG 24 hr capsule TAKE 1 CAPSULE BY MOUTH EVERY DAY WITH BREAKFAST WITH 75MG  FOR 225MG  DAILY 90 capsule 1   venlafaxine XR (EFFEXOR-XR) 75 MG 24 hr capsule Take 1 capsule (75 mg total) by mouth  daily. 90 capsule 1   No current facility-administered medications for this visit.    Patient confirms/reports the following allergies:  Allergies  Allergen Reactions   Penicillins Rash   Clindamycin Nausea Only   Ciprofloxacin Other (See Comments)   Morphine Other (See Comments)    No orders of the defined types were placed in this encounter.   AUTHORIZATION INFORMATION Primary Insurance: 1D#: Group #:  Secondary Insurance: 1D#: Group #:  SCHEDULE INFORMATION: Date: 01/12/23 Time: Location: [facility]

## 2022-12-13 MED ORDER — LEVOTHYROXINE SODIUM 75 MCG PO TABS: 75.0000 ug | ORAL_TABLET | Freq: Every day | ORAL | 3 refills | Status: DC

## 2022-12-13 NOTE — Assessment & Plan Note (Signed)
Under good control on current regimen. Continue current regimen. Continue to monitor. Call with any concerns. Refills given.   

## 2022-12-13 NOTE — Assessment & Plan Note (Signed)
Rechecking labs today. Await results. Treat as needed.  °

## 2022-12-13 NOTE — Assessment & Plan Note (Signed)
Stable. Continue to follow with rheumatology. Call with any concerns.  

## 2022-12-13 NOTE — Assessment & Plan Note (Signed)
Under good control on current regimen. Continue current regimen. Continue to monitor. Call with any concerns. Refills given. Labs drawn today.   

## 2022-12-13 NOTE — Assessment & Plan Note (Signed)
Stable. Continue to monitor. Labs drawn today. ?

## 2022-12-13 NOTE — Assessment & Plan Note (Signed)
Encouraged diet and exercise with goal of losing 1-2lbs per week. Call with any concerns.  

## 2022-12-17 ENCOUNTER — Telehealth: Payer: Self-pay

## 2022-12-17 NOTE — Telephone Encounter (Signed)
Blood thinner advice was received from [facility] on 12/13/22.  Voice message has been left for patient advising her to stop Eliquis (3) days prior to her colonoscopy and resume (3) days after her colonoscopy.  Colonoscopy scheduled with Dr. Tobi Bastos 01/12/23.  Asked patient to call office back to confirm that this message has been received.    Thanks,  [place], [place]

## 2023-01-04 ENCOUNTER — Ambulatory Visit (INDEPENDENT_AMBULATORY_CARE_PROVIDER_SITE_OTHER): Payer: No Typology Code available for payment source | Admitting: Family Medicine

## 2023-01-04 VITALS — BP 140/76 | HR 63 | Temp 97.6°F | Ht 62.6 in | Wt 265.8 lb

## 2023-01-04 DIAGNOSIS — J069 Acute upper respiratory infection, unspecified: Secondary | ICD-10-CM | POA: Diagnosis not present

## 2023-01-04 DIAGNOSIS — J988 Other specified respiratory disorders: Secondary | ICD-10-CM

## 2023-01-04 DIAGNOSIS — J029 Acute pharyngitis, unspecified: Secondary | ICD-10-CM | POA: Insufficient documentation

## 2023-01-04 DIAGNOSIS — J0140 Acute pansinusitis, unspecified: Secondary | ICD-10-CM | POA: Insufficient documentation

## 2023-01-04 MED ORDER — DOXYCYCLINE HYCLATE 100 MG PO TABS: 100.0000 mg | ORAL_TABLET | Freq: Two times a day (BID) | ORAL | 0 refills | Status: AC

## 2023-01-04 MED ORDER — PREDNISONE 10 MG PO TABS: 10.0000 mg | ORAL_TABLET | Freq: Every day | ORAL | 0 refills | Status: DC

## 2023-01-04 NOTE — Assessment & Plan Note (Deleted)
Strep and flu negative.

## 2023-01-04 NOTE — Patient Instructions (Signed)

## 2023-01-04 NOTE — Progress Notes (Signed)
BP (!) 140/76   Pulse 63   Temp 97.6 F (36.4 C) (Oral)   Ht 5' 2.6" (1.59 m)   Wt 265 lb 12.8 oz (120.6 kg)   SpO2 96%   BMI 47.69 kg/m    Subjective:    Patient ID: Kaylee Fox, female    DOB: [birth year], 61 y.o.   MRN: [id number]  HPI: Kaylee Fox is a 62 y.o. female  Chief Complaint  Patient presents with   Sore Throat   UPPER RESPIRATORY TRACT INFECTION 2 weeks ago started feeling bad, symptoms started to improve and then worsening.  Worst symptom: Headache Fever: no Cough: yes dry  Shortness of breath: no Wheezing: no Chest pain: no Chest tightness: yes Chest congestion: yes Nasal congestion: no Runny nose: no Post nasal drip: yes Sneezing: no Sore throat: yes Swollen glands: no Sinus pressure: yes Headache: yes Face pain: yes Toothache: no Ear pain: no  Ear pressure: yes left Eyes red/itching:no Eye drainage/crusting: no  Vomiting: no Rash: no Fatigue: yes Sick contacts: yes grandson Strep contacts: no  Context: worse Recurrent sinusitis: no Relief with OTC cold/cough medications: yes  Treatments attempted:  Day quil     Relevant past medical, surgical, family and social history reviewed and updated as indicated. Interim medical history since our last visit reviewed. Allergies and medications reviewed and updated.  Review of Systems  Constitutional:  Negative for chills and fever.  HENT:  Positive for congestion, postnasal drip, sinus pressure, sinus pain and sore throat. Negative for ear discharge, ear pain, rhinorrhea and sneezing.   Eyes:  Negative for pain, discharge, redness and itching.  Respiratory: Negative.    Cardiovascular: Negative.   Gastrointestinal:  Negative for nausea.  Skin:  Negative for rash.  Neurological:  Positive for headaches.    Per HPI unless specifically indicated above     Objective:    BP (!) 140/76   Pulse 63   Temp 97.6 F (36.4 C) (Oral)   Ht 5' 2.6" (1.59 m)   Wt 265 lb 12.8 oz  (120.6 kg)   SpO2 96%   BMI 47.69 kg/m   Wt Readings from Last 3 Encounters:  01/04/23 265 lb 12.8 oz (120.6 kg)  12/09/22 263 lb (119.3 kg)  01/20/22 268 lb 11.2 oz (121.9 kg)    Physical Exam Vitals and nursing note reviewed.  Constitutional:      General: She is awake. She is not in acute distress.    Appearance: Normal appearance. She is well-developed and well-groomed. She is morbidly obese. She is ill-appearing.  HENT:     Head: Normocephalic and atraumatic.     Right Ear: Hearing and external ear normal. No drainage. Tympanic membrane is not erythematous.     Left Ear: Hearing and external ear normal. No drainage. Tympanic membrane is not erythematous.     Nose: Congestion present. No rhinorrhea.     Right Turbinates: Pale. Not swollen.     Left Turbinates: Pale. Not swollen.     Right Sinus: Maxillary sinus tenderness and frontal sinus tenderness present.     Left Sinus: Maxillary sinus tenderness and frontal sinus tenderness present.     Mouth/Throat:     Pharynx: Posterior oropharyngeal erythema and postnasal drip present. No pharyngeal swelling.     Tonsils: No tonsillar exudate.  Eyes:     General: Lids are normal.        Right eye: No discharge.        Left eye:  No discharge.     Conjunctiva/sclera: Conjunctivae normal.  Cardiovascular:     Rate and Rhythm: Normal rate and regular rhythm.     Pulses:          Radial pulses are 2+ on the right side and 2+ on the left side.       Posterior tibial pulses are 2+ on the right side and 2+ on the left side.     Heart sounds: Normal heart sounds, S1 normal and S2 normal. No murmur heard.    No gallop.  Pulmonary:     Effort: Pulmonary effort is normal. No accessory muscle usage or respiratory distress.     Breath sounds: Normal breath sounds. No wheezing, rhonchi or rales.  Musculoskeletal:        General: Normal range of motion.     Cervical back: Full passive range of motion without pain and normal range of motion.      Right lower leg: No edema.     Left lower leg: No edema.  Skin:    General: Skin is warm and dry.     Capillary Refill: Capillary refill takes less than 2 seconds.  Neurological:     Mental Status: She is alert and oriented to person, place, and time.  Psychiatric:        Attention and Perception: Attention normal.        Mood and Affect: Mood normal.        Speech: Speech normal.        Behavior: Behavior normal. Behavior is cooperative.        Thought Content: Thought content normal.     Results for orders placed or performed in visit on 12/09/22  CBC with Differential/Platelet  Result Value Ref Range   WBC 4.3 3.4 - 10.8 x10E3/uL   RBC 4.04 3.77 - 5.28 x10E6/uL   Hemoglobin 12.7 11.1 - 15.9 g/dL   Hematocrit 95.2 84.1 - 46.6 %   MCV 100 (H) 79 - 97 fL   MCH 31.4 26.6 - 33.0 pg   MCHC 31.6 31.5 - 35.7 g/dL   RDW 32.4 40.1 - 02.7 %   Platelets 234 150 - 450 x10E3/uL   Neutrophils 60 Not Estab. %   Lymphs 30 Not Estab. %   Monocytes 8 Not Estab. %   Eos 0 Not Estab. %   Basos 1 Not Estab. %   Neutrophils Absolute 2.6 1.4 - 7.0 x10E3/uL   Lymphocytes Absolute 1.3 0.7 - 3.1 x10E3/uL   Monocytes Absolute 0.3 0.1 - 0.9 x10E3/uL   EOS (ABSOLUTE) 0.0 0.0 - 0.4 x10E3/uL   Basophils Absolute 0.0 0.0 - 0.2 x10E3/uL   Immature Granulocytes 1 Not Estab. %   Immature Grans (Abs) 0.0 0.0 - 0.1 x10E3/uL  Comprehensive metabolic panel  Result Value Ref Range   Glucose 95 70 - 99 mg/dL   BUN 12 8 - 27 mg/dL   Creatinine, Ser 2.53 0.57 - 1.00 mg/dL   eGFR 83 >66 YQ/IHK/7.42   BUN/Creatinine Ratio 15 12 - 28   Sodium 144 134 - 144 mmol/L   Potassium 4.3 3.5 - 5.2 mmol/L   Chloride 106 96 - 106 mmol/L   CO2 27 20 - 29 mmol/L   Calcium 8.9 8.7 - 10.3 mg/dL   Total Protein 6.7 6.0 - 8.5 g/dL   Albumin 4.0 3.9 - 4.9 g/dL   Globulin, Total 2.7 1.5 - 4.5 g/dL   Bilirubin Total 0.4 0.0 - 1.2 mg/dL   Alkaline  Phosphatase 121 44 - 121 IU/L   AST 22 0 - 40 IU/L   ALT 15 0 - 32 IU/L   Hgb A1c w/o eAG  Result Value Ref Range   Hgb A1c MFr Bld 5.3 4.8 - 5.6 %  VITAMIN D 25 Hydroxy (Vit-D Deficiency, Fractures)  Result Value Ref Range   Vit D, 25-Hydroxy 23.7 (L) 30.0 - 100.0 ng/mL  TSH  Result Value Ref Range   TSH 2.230 0.450 - 4.500 uIU/mL  Lipid Panel w/o Chol/HDL Ratio  Result Value Ref Range   Cholesterol, Total 174 100 - 199 mg/dL   Triglycerides 161 0 - 149 mg/dL   HDL 49 >09 mg/dL   VLDL Cholesterol Cal 21 5 - 40 mg/dL   LDL Chol Calc (NIH) 604 (H) 0 - 99 mg/dL  V40  Result Value Ref Range   Vitamin B-12 346 232 - 1,245 pg/mL      Assessment & Plan:   Problem List Items Addressed This Visit     Acute non-recurrent pansinusitis - Primary    Acute. Ongoing. Strep and Flu negative. Will treat with Doxycycline 100 MG BID for 7 days and 6 day Prednisone taper. Return if symptoms fail to improve.      Relevant Medications   doxycycline (VIBRA-TABS) 100 MG tablet   predniSONE (DELTASONE) 10 MG tablet   Other Visit Diagnoses     Congestion of upper airway       Strep and flu negative   Relevant Orders   Veritor Flu A/B Waived   Sore throat       Strep and flu negative   Relevant Orders   Rapid Strep screen([facility])        Follow up plan: Return if symptoms worsen or fail to improve.

## 2023-01-04 NOTE — Assessment & Plan Note (Addendum)
Acute. Ongoing. Strep and Flu negative. Will treat with Doxycycline 100 MG BID for 7 days and 6 day Prednisone taper. Return if symptoms fail to improve.

## 2023-01-07 LAB — VERITOR FLU A/B WAIVED
Influenza A: NEGATIVE
Influenza B: NEGATIVE

## 2023-01-07 LAB — RAPID STREP SCREEN (MED CTR MEBANE ONLY): Strep Gp A Ag, IA W/Reflex: NEGATIVE

## 2023-01-07 LAB — CULTURE, GROUP A STREP: Strep A Culture: NEGATIVE

## 2023-01-07 NOTE — Progress Notes (Signed)
Hi Vallorie, your strep culture has returned as negative. Thank you for allowing me to participate in your care.

## 2023-01-10 ENCOUNTER — Telehealth: Payer: Self-pay

## 2023-01-10 NOTE — Telephone Encounter (Signed)
Pt left a vm that she forgot to stop her blood thinner as advised. Pt would like a return call to reschedule.

## 2023-01-10 NOTE — Telephone Encounter (Signed)
Patient's colonoscopy has been rescheduled from 11/06 to 02/02/23 at [facility] with Dr. Tobi Bastos due to she forgot to stop her Eliquis as advised.  Patient has been reminded to stop Eliquis 3 days prior to her colonoscopy on 01/30/23.    Stephanie in Endo notified.  Thanks,  [place], [place]

## 2023-01-31 ENCOUNTER — Telehealth: Payer: Self-pay | Admitting: Family Medicine

## 2023-01-31 NOTE — Telephone Encounter (Unsigned)
Copied from CRM [id number]. Topic: Medicare AWV >> Jan 31, 2023  2:15 PM Payton Doughty wrote: Reason for CRM: Called number on file -will not connect- 01/31/2023 Called to sched AWV for this year  Verlee Rossetti; Care Guide Ambulatory Clinical Support [place] [facility] Direct Dial: [phone]

## 2023-02-01 ENCOUNTER — Encounter: Payer: Self-pay | Admitting: Gastroenterology

## 2023-02-02 ENCOUNTER — Encounter: Admission: RE | Payer: Self-pay | Source: Home / Self Care

## 2023-02-02 ENCOUNTER — Ambulatory Visit
Admission: RE | Admit: 2023-02-02 | Payer: No Typology Code available for payment source | Source: Home / Self Care | Admitting: Gastroenterology

## 2023-02-02 SURGERY — COLONOSCOPY WITH PROPOFOL
Anesthesia: General

## 2023-02-09 ENCOUNTER — Ambulatory Visit: Payer: Self-pay

## 2023-02-09 NOTE — Telephone Encounter (Signed)
Message from [place] L sent at 02/09/2023 11:38 AM EST  Summary: Possible thrush   Pt states whenever she eats or drinks something her tongue burns. Pt states pain is bearable. Pt believes she may have possible thrush. Pt states symptoms started about 2 weeks ago. Pt unsure whether to come in or if prescription can be called in for her.  Pt seeking clinical advice.         Chief Complaint: "mouth ulcers all over mouth" Symptoms: burning pain Frequency: 2 weeks ago Pertinent Negatives: Patient denies white patches, sore throat Disposition: [] ED /[] Urgent Care (no appt availability in office) / [x] Appointment(In office/virtual)/ []  [facility]/ [] Home Care/ [] Refused Recommended Disposition /[] [place] [facility]/ []  Follow-up with PCP Additional Notes: No appts at [facility], Appt made with Jacquelin Hawking, PA at [facility]. Address given to practice. After call ended, realized forgot to check with provider. Called [facility] and spoke with Iris. Pt wanted appt asap and advised that Pt was scheduled with Erin at [facility]. Iris advised to send chart over to check with provider if can keep appt or come to [facility].   Reason for Disposition  Mouth ulcer lasts > 2 weeks  Answer Assessment - Initial Assessment Questions 1. SYMPTOM: "What's the main symptom you're concerned about?" (e.g., chapped lips, dry mouth, lump, sores)     Sores on tongue "all over" 2. ONSET: "When did the  sores  start?"     2 weeks 3. PAIN: "Is there any pain?" If Yes, ask: "How bad is it?" (Scale: 1-10; mild, moderate, severe)   - MILD (1-3):  doesn't interfere with eating or normal activities   - MODERATE (4-7): interferes with eating some solids and normal activities   - SEVERE (8-10):  excruciating pain, interferes with most normal activities   - SEVERE DYSPHAGIA: can't swallow liquids, drooling     Mild to mod 4. CAUSE: "What do you think is causing the symptoms?"     Thrush  5. OTHER SYMPTOMS: "Do you have any other  symptoms?" (e.g., fever, sore throat, toothache, swelling)     Ulcers all over mouth  6. PREGNANCY: "Is there any chance you are pregnant?" "When was your last menstrual period?"     N/a  Answer Assessment - Initial Assessment Questions 1. LOCATION: "Where is the ulcer located?"      tongue 2. NUMBER: "How many ulcers are there?"      Several did not ask amount 3. SEVERITY: "Are they painful?" If Yes, ask: "How bad is it?"  (Scale 1-10; or mild, moderate, severe)  - MILD - eating  and drinking normally   - MODERATE - decreased liquid intake   - SEVERE - drinking very little      Mild- burning pain 4. ONSET: "When did you first notice the ulcer?"      2 weeks ago 5. CAUSE: "What do you think is causing the mouth ulcer?"     thrush 6. OTHER SYMPTOMS: "Do you have any other symptoms?" (e.g., fever)     no  Protocols used: Mouth Symptoms-A-AH, Mouth Ulcers-A-AH

## 2023-02-10 ENCOUNTER — Encounter: Payer: Self-pay | Admitting: Physician Assistant

## 2023-02-10 ENCOUNTER — Ambulatory Visit (INDEPENDENT_AMBULATORY_CARE_PROVIDER_SITE_OTHER): Payer: No Typology Code available for payment source | Admitting: Physician Assistant

## 2023-02-10 VITALS — BP 128/68 | HR 87 | Resp 16 | Ht 62.0 in | Wt 269.0 lb

## 2023-02-10 DIAGNOSIS — B370 Candidal stomatitis: Secondary | ICD-10-CM | POA: Diagnosis not present

## 2023-02-10 MED ORDER — NYSTATIN 100000 UNIT/ML MT SUSP
5.0000 mL | Freq: Four times a day (QID) | OROMUCOSAL | 0 refills | Status: AC
Start: 2023-02-10 — End: ?

## 2023-02-10 NOTE — Telephone Encounter (Signed)
If patient has not gone to [facility]- Clydie Braun can see her at 1:40 today

## 2023-02-10 NOTE — Telephone Encounter (Signed)
Pt had an appointment this morning at [facility] at 9:20 am with [facility].

## 2023-02-10 NOTE — Progress Notes (Signed)
Acute Office Visit   Patient: Kaylee Fox   DOB: [birth year]   61 y.o. Female  MRN: [id number] Visit Date: 02/10/2023  Today's healthcare provider: Oswaldo Conroy Loris Winrow, PA-C  Introduced myself to the patient as a Secondary school teacher and provided education on APPs in clinical practice.    Chief Complaint  Patient presents with   Mouth Lesions    x2 weeks. Feels "burnt", has white coating   Subjective    HPI HPI     Mouth Lesions    Additional comments: x2 weeks. Feels "burnt", has white coating      Last edited by Dollene Primrose, CMA on 02/10/2023  9:18 AM.       She reports she sometimes has episodes of thrush due to her lupus She reports she has developed discomfort and burning sensation on her tongue along with some blister-like lesions She states her tongue burns and is especially bothered when she eats She states this started about 2 weeks ago She has been trying to use regular mouth washes to help but these have not provided relief    Medications: Outpatient Medications Prior to Visit  Medication Sig   acetaminophen (TYLENOL) 325 MG tablet Take by mouth as needed.    albuterol (VENTOLIN HFA) 108 (90 Base) MCG/ACT inhaler Inhale 1-2 puffs into the lungs every 4 (four) hours as needed for wheezing or shortness of breath.   amLODipine (NORVASC) 10 MG tablet Take 1 tablet (10 mg total) by mouth daily.   apixaban (ELIQUIS) 5 MG TABS tablet Take 1 tablet (5 mg total) by mouth 2 (two) times daily.   atorvastatin (LIPITOR) 40 MG tablet Take 1 tablet (40 mg total) by mouth daily.   azaTHIOprine (IMURAN) 50 MG tablet Take 100 mg by mouth daily.   baclofen (LIORESAL) 10 MG tablet Take 1 tablet (10 mg total) by mouth 3 (three) times daily.   buPROPion (WELLBUTRIN XL) 150 MG 24 hr tablet Take 1 tablet (150 mg total) by mouth daily.   busPIRone (BUSPAR) 5 MG tablet Take 1 tablet (5 mg total) by mouth 2 (two) times daily.   calcium carbonate (OS-CAL) 600 MG TABS tablet Take by mouth.    cetirizine (ZYRTEC) 10 MG tablet Take 1 tablet (10 mg total) by mouth daily.   Cholecalciferol 25 MCG (1000 UT) capsule Take 1,000 Units by mouth daily. Takes 2 1000 mg per day   cholestyramine (QUESTRAN) 4 g packet Take 1 packet (4 g total) by mouth 3 (three) times daily with meals.   diclofenac Sodium (VOLTAREN) 1 % GEL Apply 4 g topically 4 (four) times daily.   fluticasone (FLONASE) 50 MCG/ACT nasal spray USE 2 SPRAYS INTO BOTH NOSTRILS DAILY.   furosemide (LASIX) 40 MG tablet Take 2 tablets (80 mg total) by mouth 2 (two) times daily.   gabapentin (NEURONTIN) 300 MG capsule Take 2 capsules (600 mg total) by mouth 2 (two) times daily.   hydroxychloroquine (PLAQUENIL) 200 MG tablet Take 400 mg by mouth 2 (two) times daily.    hydrOXYzine (ATARAX) 25 MG tablet Take 1 tablet (25 mg total) by mouth at bedtime as needed.   levothyroxine (SYNTHROID) 75 MCG tablet Take 1 tablet (75 mcg total) by mouth daily before breakfast.   montelukast (SINGULAIR) 10 MG tablet Take 1 tablet (10 mg total) by mouth at bedtime.   Multiple Vitamin (MULTI-VITAMINS) TABS Take by mouth.   mupirocin cream (BACTROBAN) 2 % Apply to affected area on  arms/legs twice a day as needed.   pantoprazole (PROTONIX) 40 MG tablet Take 1 tablet (40 mg total) by mouth daily.   potassium chloride SA (KLOR-CON M20) 20 MEQ tablet Take 1 tablet (20 mEq total) by mouth daily.   triamcinolone cream (KENALOG) 0.1 % Apply 1 Application topically 2 (two) times daily.   venlafaxine XR (EFFEXOR-XR) 150 MG 24 hr capsule TAKE 1 CAPSULE BY MOUTH EVERY DAY WITH BREAKFAST WITH 75MG  FOR 225MG  DAILY   venlafaxine XR (EFFEXOR-XR) 75 MG 24 hr capsule Take 1 capsule (75 mg total) by mouth daily.   [DISCONTINUED] nystatin (MYCOSTATIN) 100000 UNIT/ML suspension Take 5 mLs (500,000 Units total) by mouth 4 (four) times daily.   predniSONE (DELTASONE) 10 MG tablet Take 1 tablet (10 mg total) by mouth daily with breakfast. Take 6 tablets on day 1, 5 tablets on  day 2, 4 tablets on day 3, 3 tablets on day 4, 2 tablets on day 5, and 1 tablet on day 6 . (Patient not taking: Reported on 02/10/2023)   No facility-administered medications prior to visit.    Review of Systems  HENT:  Positive for mouth sores.        Concern for thrush Tongue pain and burning          Objective    BP 128/68   Pulse 87   Resp 16   Ht 5\' 2"  (1.575 m)   Wt 269 lb (122 kg)   SpO2 97%   BMI 49.20 kg/m     Physical Exam Vitals reviewed.  Constitutional:      General: She is awake.     Appearance: Normal appearance. She is well-developed and well-groomed.  HENT:     Head: Normocephalic and atraumatic.     Mouth/Throat:     Lips: Pink.     Mouth: Mucous membranes are moist.     Tongue: Lesions present.     Comments: Small red and white lesions present on tongue along with white plaques/film and cracking  Pulmonary:     Effort: Pulmonary effort is normal.  Musculoskeletal:     Cervical back: Normal range of motion.  Neurological:     General: No focal deficit present.     Mental Status: She is alert and oriented to person, place, and time.  Psychiatric:        Mood and Affect: Mood normal.        Behavior: Behavior normal. Behavior is cooperative.        Thought Content: Thought content normal.        Judgment: Judgment normal.       No results found for any visits on 02/10/23.  Assessment & Plan      No follow-ups on file.      Problem List Items Addressed This Visit   None Visit Diagnoses     Oral thrush    -  Primary   Relevant Medications   nystatin (MYCOSTATIN) 100000 UNIT/ML suspension     Acute, recurrent concern Symptoms and physical exam appear consistent with likely oral thrush Will start nystatin oral suspension to assist with management.  Reviewed administration and dosing with patient during appointment If not improving in the next 3 to 4 days patient is to reach out to office for further management Follow-up as needed  for progressing or persistent symptoms   No follow-ups on file.   I, Latina Frank E Jaye Polidori, PA-C, have reviewed all documentation for this visit. The documentation on 02/10/23 for the  exam, diagnosis, procedures, and orders are all accurate and complete.   Jacquelin Hawking, MHS, PA-C [facility]

## 2023-02-25 ENCOUNTER — Ambulatory Visit: Payer: Self-pay | Admitting: *Deleted

## 2023-02-25 DIAGNOSIS — J029 Acute pharyngitis, unspecified: Secondary | ICD-10-CM | POA: Diagnosis not present

## 2023-02-25 DIAGNOSIS — R059 Cough, unspecified: Secondary | ICD-10-CM | POA: Diagnosis not present

## 2023-02-25 DIAGNOSIS — R0981 Nasal congestion: Secondary | ICD-10-CM | POA: Diagnosis not present

## 2023-02-25 NOTE — Telephone Encounter (Signed)
Reason for Disposition  Wheezing is present  [1] MILD difficulty breathing (e.g., minimal/no SOB at rest, SOB with walking, pulse <100) AND [2] still present when not coughing  Answer Assessment - Initial Assessment Questions 1. ONSET: "When did the cough begin?"      2 days 2. SEVERITY: "How bad is the cough today?"      Coughing wakes her, deep chest cough, wheezing- when started 3. SPUTUM: "Describe the color of your sputum" (none, dry cough; clear, white, yellow, green)     no 4. HEMOPTYSIS: "Are you coughing up any blood?" If so ask: "How much?" (flecks, streaks, tablespoons, etc.)     no 5. DIFFICULTY BREATHING: "Are you having difficulty breathing?" If Yes, ask: "How bad is it?" (e.g., mild, moderate, severe)    - MILD: No SOB at rest, mild SOB with walking, speaks normally in sentences, can lie down, no retractions, pulse < 100.    - MODERATE: SOB at rest, SOB with minimal exertion and prefers to sit, cannot lie down flat, speaks in phrases, mild retractions, audible wheezing, pulse 100-120.    - SEVERE: Very SOB at rest, speaks in single words, struggling to breathe, sitting hunched forward, retractions, pulse > 120      no 6. FEVER: "Do you have a fever?" If Yes, ask: "What is your temperature, how was it measured, and when did it start?"     no  9. PE RISK FACTORS: "Do you have a history of blood clots?" (or: recent major surgery, recent prolonged travel, bedridden)     Hx pulmonary embolism  10. OTHER SYMPTOMS: "Do you have any other symptoms?" (e.g., runny nose, wheezing, chest pain)       wheezing  Protocols used: Cough - Acute Non-Productive-A-AH

## 2023-02-25 NOTE — Telephone Encounter (Signed)
  Chief Complaint: cough-chest tightness Symptoms: dry cough- chest tightness, wheezing Frequency: 2 days Pertinent Negatives: Patient denies fever, SOB Disposition: [] ED /[x] Urgent Care (no appt availability in office) / [] Appointment(In office/virtual)/ []  [facility]/ [] Home Care/ [] Refused Recommended Disposition /[] [facility]/ []  Follow-up with PCP Additional Notes: No open appointment- no open appointment with float provider- UC advised

## 2023-03-08 ENCOUNTER — Encounter: Payer: Self-pay | Admitting: Family Medicine

## 2023-03-08 ENCOUNTER — Ambulatory Visit: Payer: No Typology Code available for payment source | Admitting: Family Medicine

## 2023-03-08 ENCOUNTER — Other Ambulatory Visit (HOSPITAL_COMMUNITY)
Admission: RE | Admit: 2023-03-08 | Discharge: 2023-03-08 | Disposition: A | Discharge status: Home / Self Care | Payer: No Typology Code available for payment source | Source: Ambulatory Visit | Attending: Family Medicine | Admitting: Family Medicine

## 2023-03-08 VITALS — BP 113/73 | HR 74 | Ht 62.5 in | Wt 265.8 lb

## 2023-03-08 DIAGNOSIS — D6869 Other thrombophilia: Secondary | ICD-10-CM

## 2023-03-08 DIAGNOSIS — E063 Autoimmune thyroiditis: Secondary | ICD-10-CM

## 2023-03-08 DIAGNOSIS — Z86711 Personal history of pulmonary embolism: Secondary | ICD-10-CM

## 2023-03-08 DIAGNOSIS — H9191 Unspecified hearing loss, right ear: Secondary | ICD-10-CM

## 2023-03-08 DIAGNOSIS — I129 Hypertensive chronic kidney disease with stage 1 through stage 4 chronic kidney disease, or unspecified chronic kidney disease: Secondary | ICD-10-CM

## 2023-03-08 DIAGNOSIS — F331 Major depressive disorder, recurrent, moderate: Secondary | ICD-10-CM

## 2023-03-08 DIAGNOSIS — Z0000 Encounter for general adult medical examination without abnormal findings: Secondary | ICD-10-CM

## 2023-03-08 DIAGNOSIS — Z1151 Encounter for screening for human papillomavirus (HPV): Secondary | ICD-10-CM | POA: Insufficient documentation

## 2023-03-08 DIAGNOSIS — E559 Vitamin D deficiency, unspecified: Secondary | ICD-10-CM

## 2023-03-08 DIAGNOSIS — F32A Depression, unspecified: Secondary | ICD-10-CM | POA: Diagnosis not present

## 2023-03-08 DIAGNOSIS — E782 Mixed hyperlipidemia: Secondary | ICD-10-CM

## 2023-03-08 DIAGNOSIS — Z01419 Encounter for gynecological examination (general) (routine) without abnormal findings: Secondary | ICD-10-CM | POA: Insufficient documentation

## 2023-03-08 DIAGNOSIS — Z6841 Body Mass Index (BMI) 40.0 and over, adult: Secondary | ICD-10-CM

## 2023-03-08 DIAGNOSIS — Z1211 Encounter for screening for malignant neoplasm of colon: Secondary | ICD-10-CM

## 2023-03-08 DIAGNOSIS — G709 Myoneural disorder, unspecified: Secondary | ICD-10-CM | POA: Diagnosis not present

## 2023-03-08 DIAGNOSIS — M329 Systemic lupus erythematosus, unspecified: Secondary | ICD-10-CM | POA: Diagnosis not present

## 2023-03-08 DIAGNOSIS — Z124 Encounter for screening for malignant neoplasm of cervix: Secondary | ICD-10-CM

## 2023-03-08 DIAGNOSIS — Z1231 Encounter for screening mammogram for malignant neoplasm of breast: Secondary | ICD-10-CM

## 2023-03-08 LAB — MICROALBUMIN, URINE WAIVED
Creatinine, Urine Waived: 100 mg/dL (ref 10–300)
Microalb, Ur Waived: 150 mg/L — ABNORMAL HIGH (ref 0–19)

## 2023-03-08 NOTE — Patient Instructions (Signed)
 Preventative Services:  Health Risk Assessment and Personalized Prevention Plan: Done today Bone Mass Measurements: N/A Breast Cancer Screening: ordered today CVD Screening: done today Cervical Cancer Screening: done today Colon Cancer Screening: ordered today Depression Screening: done today Diabetes Screening: done today Glaucoma Screening: see your eye doctor Hepatitis B vaccine: N/A Hepatitis C screening: up to date HIV Screening: up to date Flu Vaccine: up to date Lung cancer Screening:N/A Obesity Screening: Done today Pneumonia Vaccines (2): N/A STI Screening: N/A

## 2023-03-08 NOTE — Progress Notes (Signed)
 BP 113/73   Pulse 74   Ht 5' 2.5 (1.588 m)   Wt 265 lb 12.8 oz (120.6 kg)   SpO2 99%   BMI 47.84 kg/m    Subjective:    Patient ID: Kaylee Fox, female    DOB: 02/25/1961, 62 y.o.   MRN: [id number]  HPI: Cedricka Sackrider is a 62 y.o. female presenting on 03/08/2023 for comprehensive medical examination. Current medical complaints include:  FATIGUE Duration:  chronic Severity: moderate  Onset: gradual Context when symptoms started:  unknown Symptoms improve with rest: no  Depressive symptoms: yes Stress/anxiety: no Insomnia: no  Snoring: no Observed apnea by bed partner: no Daytime hypersomnolence:no Wakes feeling refreshed: no History of sleep study: no Dysnea on exertion:  no Orthopnea/PND: no Chest pain: no Chronic cough: no Lower extremity edema: no Arthralgias:no Myalgias: no Weakness: no Rash: no  HYPERTENSION / HYPERLIPIDEMIA Satisfied with current treatment? yes Duration of hypertension: chronic BP monitoring frequency: not checking BP medication side effects: no Past BP meds: lasix , amlodipine  Duration of hyperlipidemia: chronic Cholesterol medication side effects: no Cholesterol supplements: none Past cholesterol medications: atorvastatin  Medication compliance: excellent compliance Aspirin: no Recent stressors: no Recurrent headaches: no Visual changes: no Palpitations: no Dyspnea: no Chest pain: no Lower extremity edema: no Dizzy/lightheaded: no  HYPOTHYROIDISM Thyroid  control status:controlled Satisfied with current treatment? no Medication side effects: no Medication compliance: excellent compliance Recent dose adjustment:no Fatigue: no Cold intolerance: no Heat intolerance: no Weight gain: no Weight loss: no Constipation: no Diarrhea/loose stools: no Palpitations: no Lower extremity edema: no Anxiety/depressed mood: no  DEPRESSION Mood status: controlled Satisfied with current treatment?: yes Symptom severity:  moderate  Duration of current treatment : chronic Side effects: no Medication compliance: excellent compliance Psychotherapy/counseling: no  Previous psychiatric medications: wellbutrin , effexor  Depressed mood: no Anxious mood: no Anhedonia: no Significant weight loss or gain: no Insomnia: no  Fatigue: yes Feelings of worthlessness or guilt: no Impaired concentration/indecisiveness: no Suicidal ideations: no Hopelessness: no Crying spells: no    03/08/2023   10:21 AM 02/10/2023    9:08 AM 01/04/2023   10:36 AM 12/09/2022    9:49 AM 01/20/2022    3:43 PM  Depression screen PHQ 2/9  Decreased Interest 2 0 2 1 0  Down, Depressed, Hopeless 0 0 2 0 0  PHQ - 2 Score 2 0 4 1 0  Altered sleeping 1 0 0 3 0  Tired, decreased energy 1 0 1 3 2   Change in appetite 0 0 0 0 0  Feeling bad or failure about yourself  0 0 0 0 0  Trouble concentrating 0 0 2 0 0  Moving slowly or fidgety/restless 1 0 2 1 0  Suicidal thoughts 0 0 0 0 0  PHQ-9 Score 5 0 9 8 2   Difficult doing work/chores Not difficult at all  Not difficult at all Not difficult at all    Menopausal Symptoms: no  Functional Status Survey: Is the patient deaf or have difficulty hearing?: Yes Does the patient have difficulty seeing, even when wearing glasses/contacts?: No Does the patient have difficulty concentrating, remembering, or making decisions?: Yes Does the patient have difficulty walking or climbing stairs?: Yes Does the patient have difficulty dressing or bathing?: No Does the patient have difficulty doing errands alone such as visiting a doctor's office or shopping?: No     03/08/2023   10:20 AM 02/10/2023    9:07 AM 01/20/2022    3:43 PM 01/15/2022    2:36 PM  12/10/2021   12:04 PM  Fall Risk   Falls in the past year? 1 0 1 1 0  Number falls in past yr: 0 0 1 1 0  Injury with Fall? 0 0 0 0 0  Risk for fall due to : History of fall(s) No Fall Risks History of fall(s) No Fall Risks   Follow up Falls evaluation  completed Falls prevention discussed Falls evaluation completed Falls evaluation completed Falls evaluation completed;Education provided;Falls prevention discussed    Depression Screen    03/08/2023   10:21 AM 02/10/2023    9:08 AM 01/04/2023   10:36 AM 12/09/2022    9:49 AM 01/20/2022    3:43 PM  Depression screen PHQ 2/9  Decreased Interest 2 0 2 1 0  Down, Depressed, Hopeless 0 0 2 0 0  PHQ - 2 Score 2 0 4 1 0  Altered sleeping 1 0 0 3 0  Tired, decreased energy 1 0 1 3 2   Change in appetite 0 0 0 0 0  Feeling bad or failure about yourself  0 0 0 0 0  Trouble concentrating 0 0 2 0 0  Moving slowly or fidgety/restless 1 0 2 1 0  Suicidal thoughts 0 0 0 0 0  PHQ-9 Score 5 0 9 8 2   Difficult doing work/chores Not difficult at all  Not difficult at all Not difficult at all     Advanced Directives Does patient have a HCPOA?    no If yes, name and contact information:  Does patient have a living will or MOST form?  no  Past Medical History:  Past Medical History:  Diagnosis Date   Anticoagulant long-term use    Anxiety    Autoimmune hypothyroidism    Chronic hypokalemia    Chronic pain    Chronic, continuous use of opioids    Depression    Diverticulosis    Fatigue    Fibromyalgia    Herpes labialis    Herpes simplex    History of pulmonary embolus (PE)    History of pulmonary hypertension    Hypertension    Lupus (systemic lupus erythematosus) (HCC)    Neuromuscular disorder (HCC)    OSA on CPAP    Osteoporosis    Venous ulcers of both lower extremities (HCC) 06/17/2016   Vitamin D  deficiency     Surgical History:  Past Surgical History:  Procedure Laterality Date   CESAREAN SECTION     CHOLECYSTECTOMY     COLONOSCOPY WITH PROPOFOL  N/A 07/16/2016   Procedure: COLONOSCOPY WITH PROPOFOL ;  Surgeon: Therisa Bi, MD;  Location[facility] ENDOSCOPY;  Service: Endoscopy;  Laterality: N/A;   LAPAROSCOPIC GASTRIC SLEEVE RESECTION      Medications:  Current Outpatient  Medications on File Prior to Visit  Medication Sig   acetaminophen  (TYLENOL ) 325 MG tablet Take by mouth as needed.    albuterol  (VENTOLIN  HFA) 108 (90 Base) MCG/ACT inhaler Inhale 1-2 puffs into the lungs every 4 (four) hours as needed for wheezing or shortness of breath.   amLODipine  (NORVASC ) 10 MG tablet Take 1 tablet (10 mg total) by mouth daily.   apixaban  (ELIQUIS ) 5 MG TABS tablet Take 1 tablet (5 mg total) by mouth 2 (two) times daily.   atorvastatin  (LIPITOR) 40 MG tablet Take 1 tablet (40 mg total) by mouth daily.   azaTHIOprine (IMURAN) 50 MG tablet Take 100 mg by mouth daily.   baclofen  (LIORESAL ) 10 MG tablet Take 1 tablet (10 mg total) by mouth  3 (three) times daily.   buPROPion  (WELLBUTRIN  XL) 150 MG 24 hr tablet Take 1 tablet (150 mg total) by mouth daily.   busPIRone  (BUSPAR ) 5 MG tablet Take 1 tablet (5 mg total) by mouth 2 (two) times daily.   calcium  carbonate (OS-CAL) 600 MG TABS tablet Take by mouth.   cetirizine  (ZYRTEC ) 10 MG tablet Take 1 tablet (10 mg total) by mouth daily.   Cholecalciferol  25 MCG (1000 UT) capsule Take 1,000 Units by mouth daily. Takes 2 1000 mg per day   cholestyramine  (QUESTRAN ) 4 g packet Take 1 packet (4 g total) by mouth 3 (three) times daily with meals.   diclofenac  Sodium (VOLTAREN ) 1 % GEL Apply 4 g topically 4 (four) times daily.   fluticasone  (FLONASE ) 50 MCG/ACT nasal spray USE 2 SPRAYS INTO BOTH NOSTRILS DAILY.   furosemide  (LASIX ) 40 MG tablet Take 2 tablets (80 mg total) by mouth 2 (two) times daily.   gabapentin  (NEURONTIN ) 300 MG capsule Take 2 capsules (600 mg total) by mouth 2 (two) times daily.   hydroxychloroquine  (PLAQUENIL ) 200 MG tablet Take 400 mg by mouth 2 (two) times daily.    hydrOXYzine  (ATARAX ) 25 MG tablet Take 1 tablet (25 mg total) by mouth at bedtime as needed.   levothyroxine  (SYNTHROID ) 75 MCG tablet Take 1 tablet (75 mcg total) by mouth daily before breakfast.   montelukast  (SINGULAIR ) 10 MG tablet Take 1 tablet  (10 mg total) by mouth at bedtime.   Multiple Vitamin (MULTI-VITAMINS) TABS Take by mouth.   mupirocin cream (BACTROBAN) 2 % Apply to affected area on arms/legs twice a day as needed.   nystatin  (MYCOSTATIN ) 100000 UNIT/ML suspension Take 5 mLs (500,000 Units total) by mouth 4 (four) times daily.   pantoprazole  (PROTONIX ) 40 MG tablet Take 1 tablet (40 mg total) by mouth daily.   potassium chloride  SA (KLOR-CON  M20) 20 MEQ tablet Take 1 tablet (20 mEq total) by mouth daily.   triamcinolone  cream (KENALOG ) 0.1 % Apply 1 Application topically 2 (two) times daily.   venlafaxine  XR (EFFEXOR -XR) 150 MG 24 hr capsule TAKE 1 CAPSULE BY MOUTH EVERY DAY WITH BREAKFAST WITH 75MG  FOR 225MG  DAILY   venlafaxine  XR (EFFEXOR -XR) 75 MG 24 hr capsule Take 1 capsule (75 mg total) by mouth daily.   No current facility-administered medications on file prior to visit.    Allergies:  Allergies  Allergen Reactions   Penicillins Rash   Clindamycin Nausea Only   Ciprofloxacin Other (See Comments)   Morphine Other (See Comments)    Social History:  Social History   Socioeconomic History   Marital status: Legally Separated    Spouse name: Not on file   Number of children: 2   Years of education: 12   Highest education level: High school graduate  Occupational History   Occupation: disability   Tobacco Use   Smoking status: Never   Smokeless tobacco: Never  Vaping Use   Vaping status: Never Used  Substance and Sexual Activity   Alcohol use: No   Drug use: No   Sexual activity: Yes    Birth control/protection: Post-menopausal  Other Topics Concern   Not on file  Social History Narrative   Not on file   Social Drivers of Health   Financial Resource Strain: Low Risk  (12/10/2021)   Overall Financial Resource Strain (CARDIA)    Difficulty of Paying Living Expenses: Not hard at all  Food Insecurity: No Food Insecurity (03/08/2023)   Hunger Vital Sign  Worried About Programme Researcher, Broadcasting/film/video in the  Last Year: Never true    Ran Out of Food in the Last Year: Never true  Transportation Needs: No Transportation Needs (03/08/2023)   PRAPARE - Administrator, Civil Service (Medical): No    Lack of Transportation (Non-Medical): No  Physical Activity: Insufficiently Active (12/10/2021)   Exercise Vital Sign    Days of Exercise per Week: 3 days    Minutes of Exercise per Session: 30 min  Stress: Stress Concern Present (12/10/2021)   [employer] of Occupational Health - Occupational Stress Questionnaire    Feeling of Stress : Rather much  Social Connections: Socially Isolated (12/10/2021)   Social Connection and Isolation Panel [NHANES]    Frequency of Communication with Friends and Family: More than three times a week    Frequency of Social Gatherings with Friends and Family: Never    Attends Religious Services: Never    Database Administrator or Organizations: No    Attends Banker Meetings: Never    Marital Status: Separated  Intimate Partner Violence: Not At Risk (03/08/2023)   Humiliation, Afraid, Rape, and Kick questionnaire    Fear of Current or Ex-Partner: No    Emotionally Abused: No    Physically Abused: No    Sexually Abused: No   Social History   Tobacco Use  Smoking Status Never  Smokeless Tobacco Never   Social History   Substance and Sexual Activity  Alcohol Use No    Family History:  Family History  Problem Relation Age of Onset   Arthritis Mother    Osteoarthritis Mother    Lupus Mother    Hypothyroidism Mother    Osteoarthritis Father    Emphysema Father    Coronary artery disease Father    Breast cancer Paternal Aunt     Past medical history, surgical history, medications, allergies, family history and social history reviewed with patient today and changes made to appropriate areas of the chart.   Review of Systems  Constitutional:  Positive for chills and malaise/fatigue. Negative for diaphoresis, fever and weight  loss.  HENT: Negative.    Eyes: Negative.   Respiratory:  Positive for cough. Negative for hemoptysis, sputum production, shortness of breath and wheezing.   Cardiovascular: Negative.   Gastrointestinal:  Positive for heartburn. Negative for abdominal pain, blood in stool, constipation, diarrhea, melena, nausea and vomiting.  Genitourinary: Negative.   Musculoskeletal: Negative.   Skin: Negative.   Neurological:  Positive for dizziness. Negative for tingling, tremors, sensory change, speech change, focal weakness, seizures, loss of consciousness, weakness and headaches.  Endo/Heme/Allergies:  Positive for environmental allergies and polydipsia. Does not bruise/bleed easily.  Psychiatric/Behavioral: Negative.      All other ROS negative except what is listed above and in the HPI.      Objective:    BP 113/73   Pulse 74   Ht 5' 2.5 (1.588 m)   Wt 265 lb 12.8 oz (120.6 kg)   SpO2 99%   BMI 47.84 kg/m   Wt Readings from Last 3 Encounters:  03/08/23 265 lb 12.8 oz (120.6 kg)  02/10/23 269 lb (122 kg)  01/04/23 265 lb 12.8 oz (120.6 kg)     Physical Exam Vitals and nursing note reviewed. Exam conducted with a chaperone present.  Constitutional:      General: She is not in acute distress.    Appearance: Normal appearance. She is obese. She is not ill-appearing, toxic-appearing or diaphoretic.  HENT:     Head: Normocephalic and atraumatic.     Right Ear: Tympanic membrane, ear canal and external ear normal. There is no impacted cerumen.     Left Ear: Tympanic membrane, ear canal and external ear normal. There is no impacted cerumen.     Nose: Nose normal. No congestion or rhinorrhea.     Mouth/Throat:     Mouth: Mucous membranes are moist.     Pharynx: Oropharynx is clear. No oropharyngeal exudate or posterior oropharyngeal erythema.  Eyes:     General: No scleral icterus.       Right eye: No discharge.        Left eye: No discharge.     Extraocular Movements: Extraocular  movements intact.     Conjunctiva/sclera: Conjunctivae normal.     Pupils: Pupils are equal, round, and reactive to light.  Neck:     Vascular: No carotid bruit.  Cardiovascular:     Rate and Rhythm: Normal rate and regular rhythm.     Pulses: Normal pulses.     Heart sounds: No murmur heard.    No friction rub. No gallop.  Pulmonary:     Effort: Pulmonary effort is normal. No respiratory distress.     Breath sounds: Normal breath sounds. No stridor. No wheezing, rhonchi or rales.  Chest:     Chest wall: No tenderness.  Breasts:    Right: Normal.     Left: Normal.  Abdominal:     General: Abdomen is flat. Bowel sounds are normal. There is no distension.     Palpations: Abdomen is soft. There is no mass.     Tenderness: There is no abdominal tenderness. There is no right CVA tenderness, left CVA tenderness, guarding or rebound.     Hernia: No hernia is present.  Genitourinary:    Labia:        Right: No rash, tenderness, lesion or injury.        Left: No rash, tenderness, lesion or injury.      Vagina: Normal.     Cervix: Normal.     Uterus: Normal.      Adnexa: Right adnexa normal and left adnexa normal.  Musculoskeletal:        General: No swelling, tenderness, deformity or signs of injury.     Cervical back: Normal range of motion and neck supple. No rigidity. No muscular tenderness.     Right lower leg: No edema.     Left lower leg: No edema.  Lymphadenopathy:     Cervical: No cervical adenopathy.  Skin:    General: Skin is warm and dry.     Capillary Refill: Capillary refill takes less than 2 seconds.     Coloration: Skin is not jaundiced or pale.     Findings: No bruising, erythema, lesion or rash.  Neurological:     General: No focal deficit present.     Mental Status: She is alert and oriented to person, place, and time. Mental status is at baseline.     Cranial Nerves: No cranial nerve deficit.     Sensory: No sensory deficit.     Motor: No weakness.      Coordination: Coordination normal.     Gait: Gait normal.     Deep Tendon Reflexes: Reflexes normal.  Psychiatric:        Mood and Affect: Mood normal.        Behavior: Behavior normal.        Thought Content:  Thought content normal.        Judgment: Judgment normal.        03/08/2023   10:56 AM 12/10/2021   12:06 PM 11/29/2020   12:18 PM 11/26/2019    1:10 PM 08/22/2017    8:59 AM  6CIT Screen  What Year? 0 points 0 points 0 points 0 points 0 points  What month? 0 points 0 points 0 points 0 points 0 points  What time? 0 points 0 points 0 points 0 points 0 points  Count back from 20 0 points 0 points 0 points 0 points 0 points  Months in reverse 0 points 0 points 0 points 0 points 0 points  Repeat phrase 4 points 0 points 0 points 0 points 0 points  Total Score 4 points 0 points 0 points 0 points 0 points    Results for orders placed or performed in visit on 03/08/23  Microalbumin, Urine Waived   Collection Time: 03/08/23 10:20 AM  Result Value Ref Range   Microalb, Ur Waived 150 (H) 0 - 19 mg/L   Creatinine, Urine Waived 100 10 - 300 mg/dL   Microalb/Creat Ratio 30-300 (H) <30 mg/g  CBC with Differential/Platelet   Collection Time: 03/08/23 10:22 AM  Result Value Ref Range   WBC 7.0 3.4 - 10.8 x10E3/uL   RBC 3.94 3.77 - 5.28 x10E6/uL   Hemoglobin 12.4 11.1 - 15.9 g/dL   Hematocrit 61.8 65.9 - 46.6 %   MCV 97 79 - 97 fL   MCH 31.5 26.6 - 33.0 pg   MCHC 32.5 31.5 - 35.7 g/dL   RDW 87.9 88.2 - 84.5 %   Platelets 339 150 - 450 x10E3/uL   Neutrophils 77 Not Estab. %   Lymphs 17 Not Estab. %   Monocytes 5 Not Estab. %   Eos 0 Not Estab. %   Basos 1 Not Estab. %   Neutrophils Absolute 5.4 1.4 - 7.0 x10E3/uL   Lymphocytes Absolute 1.2 0.7 - 3.1 x10E3/uL   Monocytes Absolute 0.4 0.1 - 0.9 x10E3/uL   EOS (ABSOLUTE) 0.0 0.0 - 0.4 x10E3/uL   Basophils Absolute 0.0 0.0 - 0.2 x10E3/uL   Immature Granulocytes 0 Not Estab. %   Immature Grans (Abs) 0.0 0.0 - 0.1 x10E3/uL   Comprehensive metabolic panel   Collection Time: 03/08/23 10:22 AM  Result Value Ref Range   Glucose 76 70 - 99 mg/dL   BUN 11 8 - 27 mg/dL   Creatinine, Ser 9.20 0.57 - 1.00 mg/dL   eGFR 85 >40 fO/fpw/8.26   BUN/Creatinine Ratio 14 12 - 28   Sodium 142 134 - 144 mmol/L   Potassium 4.0 3.5 - 5.2 mmol/L   Chloride 104 96 - 106 mmol/L   CO2 25 20 - 29 mmol/L   Calcium  8.5 (L) 8.7 - 10.3 mg/dL   Total Protein 6.0 6.0 - 8.5 g/dL   Albumin 3.8 (L) 3.9 - 4.9 g/dL   Globulin, Total 2.2 1.5 - 4.5 g/dL   Bilirubin Total 0.5 0.0 - 1.2 mg/dL   Alkaline Phosphatase 115 44 - 121 IU/L   AST 16 0 - 40 IU/L   ALT 10 0 - 32 IU/L  Lipid Panel w/o Chol/HDL Ratio   Collection Time: 03/08/23 10:22 AM  Result Value Ref Range   Cholesterol, Total 149 100 - 199 mg/dL   Triglycerides 92 0 - 149 mg/dL   HDL 38 (L) >60 mg/dL   VLDL Cholesterol Cal 17 5 - 40 mg/dL  LDL Chol Calc (NIH) 94 0 - 99 mg/dL  TSH   Collection Time: 03/08/23 10:22 AM  Result Value Ref Range   TSH 2.770 0.450 - 4.500 uIU/mL  VITAMIN D  25 Hydroxy (Vit-D Deficiency, Fractures)   Collection Time: 03/08/23 10:22 AM  Result Value Ref Range   Vit D, 25-Hydroxy 27.6 (L) 30.0 - 100.0 ng/mL      Assessment & Plan:   Problem List Items Addressed This Visit       Endocrine   Autoimmune hypothyroidism   Rechecking labs today. Await results. Treat as needed.       Relevant Orders   Comprehensive metabolic panel (Completed)   TSH (Completed)     Nervous and Auditory   Neuromuscular disorder (HCC)   Under good control on current regimen. Continue current regimen. Continue to monitor. Call with any concerns. Refills given.  Labs drawn today.         Genitourinary   Benign hypertensive renal disease   Under good control on current regimen. Continue current regimen. Continue to monitor. Call with any concerns. Refills given.  Labs drawn today.       Relevant Orders   Comprehensive metabolic panel (Completed)    Microalbumin, Urine Waived (Completed)     Hematopoietic and Hemostatic   Acquired thrombophilia (HCC)   Rechecking labs today. Await results. Treat as needed.       Relevant Orders   CBC with Differential/Platelet (Completed)   Comprehensive metabolic panel (Completed)     Other   Morbid obesity with BMI of 40.0-44.9, adult (HCC) (Chronic)   Encouraged diet and exercise. Call with any concerns. Continue to monitor.       Depression   Under good control on current regimen. Continue current regimen. Continue to monitor. Call with any concerns. Refills given.  Labs drawn today.       Relevant Orders   Comprehensive metabolic panel (Completed)   Vitamin D  deficiency   Rechecking labs today. Await results. Treat as needed.       Relevant Orders   Comprehensive metabolic panel (Completed)   VITAMIN D  25 Hydroxy (Vit-D Deficiency, Fractures) (Completed)   History of pulmonary embolus (PE)   Stable. Continue anti-coagulattion. Call with any concerns.       Relevant Orders   Comprehensive metabolic panel (Completed)   Lupus (systemic lupus erythematosus) (HCC)   Under good control on current regimen. Continue current regimen. Continue to monitor. Call with any concerns. Refills given.  Labs drawn today.       Mixed hyperlipidemia   Under good control on current regimen. Continue current regimen. Continue to monitor. Call with any concerns. Refills given.  Labs drawn today.       Relevant Orders   Comprehensive metabolic panel (Completed)   Lipid Panel w/o Chol/HDL Ratio (Completed)   Other Visit Diagnoses       Encounter for Medicare annual wellness exam    -  Primary   Preventative care discussed today as below.     Routine general medical examination at a health care facility       Vaccines up to date. Screening labs checked today. Pap done. Mammo and colonoscopy ordered today. Continue diet and exercise. Call with any concerns.     Hearing loss of right ear,  unspecified hearing loss type       Relevant Orders   Ambulatory referral to Audiology     Screening for cervical cancer       Pap done  today.   Relevant Orders   Cytology - PAP     Screening for colon cancer       Cologuard ordered today.   Relevant Orders   Cologuard     Encounter for screening mammogram for malignant neoplasm of breast       Mammo ordered today.   Relevant Orders   MM 3D SCREENING MAMMOGRAM BILATERAL BREAST        Preventative Services:  Health Risk Assessment and Personalized Prevention Plan: Done today Bone Mass Measurements: N/A Breast Cancer Screening: ordered today CVD Screening: done today Cervical Cancer Screening: done today Colon Cancer Screening: ordered today Depression Screening: done today Diabetes Screening: done today Glaucoma Screening: see your eye doctor Hepatitis B vaccine: N/A Hepatitis C screening: up to date HIV Screening: up to date Flu Vaccine: up to date Lung cancer Screening:N/A Obesity Screening: Done today Pneumonia Vaccines (2): N/A STI Screening: N/A  Follow up plan: Return in about 6 months (around 09/05/2023).   LABORATORY TESTING:  - Pap smear: not applicable  IMMUNIZATIONS:   - Tdap: Tetanus vaccination status reviewed: last tetanus booster within 10 years. - Influenza: Up to date - Pneumovax: Not applicable - Prevnar: Not applicable - Zostavax vaccine: Refused  SCREENING: -Mammogram: Ordered today  - Colonoscopy: Ordered today   PATIENT COUNSELING:   Advised to take 1 mg of folate supplement per day if capable of pregnancy.   Sexuality: Discussed sexually transmitted diseases, partner selection, use of condoms, avoidance of unintended pregnancy  and contraceptive alternatives.   Advised to avoid cigarette smoking.  I discussed with the patient that most people either abstain from alcohol or drink within safe limits (<=14/week and <=4 drinks/occasion for males, <=7/weeks and <= 3 drinks/occasion for  females) and that the risk for alcohol disorders and other health effects rises proportionally with the number of drinks per week and how often a drinker exceeds daily limits.  Discussed cessation/primary prevention of drug use and availability of treatment for abuse.   Diet: Encouraged to adjust caloric intake to maintain  or achieve ideal body weight, to reduce intake of dietary saturated fat and total fat, to limit sodium intake by avoiding high sodium foods and not adding table salt, and to maintain adequate dietary potassium and calcium  preferably from fresh fruits, vegetables, and low-fat dairy products.    stressed the importance of regular exercise  Injury prevention: Discussed safety belts, safety helmets, smoke detector, smoking near bedding or upholstery.   Dental health: Discussed importance of regular tooth brushing, flossing, and dental visits.    NEXT PREVENTATIVE PHYSICAL DUE IN 1 YEAR. Return in about 6 months (around 09/05/2023).

## 2023-03-09 LAB — CBC WITH DIFFERENTIAL/PLATELET
Basophils Absolute: 0 10*3/uL (ref 0.0–0.2)
Basos: 1 %
EOS (ABSOLUTE): 0 10*3/uL (ref 0.0–0.4)
Eos: 0 %
Hematocrit: 38.1 % (ref 34.0–46.6)
Hemoglobin: 12.4 g/dL (ref 11.1–15.9)
Immature Grans (Abs): 0 10*3/uL (ref 0.0–0.1)
Immature Granulocytes: 0 %
Lymphocytes Absolute: 1.2 10*3/uL (ref 0.7–3.1)
Lymphs: 17 %
MCH: 31.5 pg (ref 26.6–33.0)
MCHC: 32.5 g/dL (ref 31.5–35.7)
MCV: 97 fL (ref 79–97)
Monocytes Absolute: 0.4 10*3/uL (ref 0.1–0.9)
Monocytes: 5 %
Neutrophils Absolute: 5.4 10*3/uL (ref 1.4–7.0)
Neutrophils: 77 %
Platelets: 339 10*3/uL (ref 150–450)
RBC: 3.94 x10E6/uL (ref 3.77–5.28)
RDW: 12 % (ref 11.7–15.4)
WBC: 7 10*3/uL (ref 3.4–10.8)

## 2023-03-09 LAB — COMPREHENSIVE METABOLIC PANEL
ALT: 10 [IU]/L (ref 0–32)
AST: 16 [IU]/L (ref 0–40)
Albumin: 3.8 g/dL — ABNORMAL LOW (ref 3.9–4.9)
Alkaline Phosphatase: 115 [IU]/L (ref 44–121)
BUN/Creatinine Ratio: 14 (ref 12–28)
BUN: 11 mg/dL (ref 8–27)
Bilirubin Total: 0.5 mg/dL (ref 0.0–1.2)
CO2: 25 mmol/L (ref 20–29)
Calcium: 8.5 mg/dL — ABNORMAL LOW (ref 8.7–10.3)
Chloride: 104 mmol/L (ref 96–106)
Creatinine, Ser: 0.79 mg/dL (ref 0.57–1.00)
Globulin, Total: 2.2 g/dL (ref 1.5–4.5)
Glucose: 76 mg/dL (ref 70–99)
Potassium: 4 mmol/L (ref 3.5–5.2)
Sodium: 142 mmol/L (ref 134–144)
Total Protein: 6 g/dL (ref 6.0–8.5)
eGFR: 85 mL/min/{1.73_m2} (ref >59)

## 2023-03-09 LAB — LIPID PANEL W/O CHOL/HDL RATIO
Cholesterol, Total: 149 mg/dL (ref 100–199)
HDL: 38 mg/dL — ABNORMAL LOW (ref >39)
LDL Chol Calc (NIH): 94 mg/dL (ref 0–99)
Triglycerides: 92 mg/dL (ref 0–149)
VLDL Cholesterol Cal: 17 mg/dL (ref 5–40)

## 2023-03-09 LAB — TSH: TSH: 2.77 u[IU]/mL (ref 0.450–4.500)

## 2023-03-09 LAB — VITAMIN D 25 HYDROXY (VIT D DEFICIENCY, FRACTURES): Vit D, 25-Hydroxy: 27.6 ng/mL — ABNORMAL LOW (ref 30.0–100.0)

## 2023-03-13 NOTE — Assessment & Plan Note (Signed)
 Under good control on current regimen. Continue current regimen. Continue to monitor. Call with any concerns. Refills given. Labs drawn today.

## 2023-03-13 NOTE — Assessment & Plan Note (Signed)
 Rechecking labs today. Await results. Treat as needed.

## 2023-03-13 NOTE — Assessment & Plan Note (Signed)
 Stable. Continue anti-coagulattion. Call with any concerns.

## 2023-03-13 NOTE — Assessment & Plan Note (Signed)
Encouraged diet and exercise. Call with any concerns. Continue to monitor.  

## 2023-03-14 ENCOUNTER — Encounter (INDEPENDENT_AMBULATORY_CARE_PROVIDER_SITE_OTHER): Payer: Self-pay | Admitting: Otolaryngology

## 2023-03-15 DIAGNOSIS — Z79899 Other long term (current) drug therapy: Secondary | ICD-10-CM | POA: Diagnosis not present

## 2023-03-15 DIAGNOSIS — M329 Systemic lupus erythematosus, unspecified: Secondary | ICD-10-CM | POA: Diagnosis not present

## 2023-03-15 LAB — CYTOLOGY - PAP
Comment: NEGATIVE
Diagnosis: NEGATIVE
High risk HPV: NEGATIVE

## 2023-04-03 DIAGNOSIS — J0110 Acute frontal sinusitis, unspecified: Secondary | ICD-10-CM | POA: Diagnosis not present

## 2023-04-03 DIAGNOSIS — R051 Acute cough: Secondary | ICD-10-CM | POA: Diagnosis not present

## 2023-04-05 ENCOUNTER — Other Ambulatory Visit: Payer: Self-pay | Admitting: Family Medicine

## 2023-04-07 NOTE — Telephone Encounter (Signed)
Requested Prescriptions  Pending Prescriptions Disp Refills   furosemide (LASIX) 40 MG tablet [Pharmacy Med Name: FUROSEMIDE 40 MG TABLET] 180 tablet 1    Sig: TAKE 2 TABLETS BY MOUTH 2 TIMES DAILY.     Cardiovascular:  Diuretics - Loop Failed - 04/07/2023  8:51 AM      Failed - Ca in normal range and within 180 days    Calcium  Date Value Ref Range Status  03/08/2023 8.5 (L) 8.7 - 10.3 mg/dL Final         Failed - Mg Level in normal range and within 180 days    No results found for: "MG"       Passed - K in normal range and within 180 days    Potassium  Date Value Ref Range Status  03/08/2023 4.0 3.5 - 5.2 mmol/L Final         Passed - Na in normal range and within 180 days    Sodium  Date Value Ref Range Status  03/08/2023 142 134 - 144 mmol/L Final         Passed - Cr in normal range and within 180 days    Creatinine, Ser  Date Value Ref Range Status  03/08/2023 0.79 0.57 - 1.00 mg/dL Final         Passed - Cl in normal range and within 180 days    Chloride  Date Value Ref Range Status  03/08/2023 104 96 - 106 mmol/L Final         Passed - Last BP in normal range    BP Readings from Last 1 Encounters:  03/08/23 113/73         Passed - Valid encounter within last 6 months    Recent Outpatient Visits           1 month ago Encounter for [employer] annual wellness exam   [place] [facility] [place], Megan P, DO   1 month ago Oral thrush   [facility] Mecum, Oswaldo Conroy, PA-C   3 months ago Acute non-recurrent pansinusitis   [place] [facility] [place], Sherran Needs, NP   3 months ago Chronic diarrhea   [place] [facility] [place], Megan P, DO   1 year ago Routine general medical examination at a health care facility   [facility] Dorcas Carrow, DO       Future Appointments             In 5 months Laural Benes, Oralia Rud, DO [place] [facility],  PEC

## 2023-04-19 ENCOUNTER — Ambulatory Visit (INDEPENDENT_AMBULATORY_CARE_PROVIDER_SITE_OTHER): Payer: No Typology Code available for payment source | Admitting: Audiology

## 2023-04-19 ENCOUNTER — Institutional Professional Consult (permissible substitution) (INDEPENDENT_AMBULATORY_CARE_PROVIDER_SITE_OTHER): Payer: No Typology Code available for payment source

## 2023-04-29 ENCOUNTER — Other Ambulatory Visit: Payer: Self-pay | Admitting: Family Medicine

## 2023-05-02 NOTE — Telephone Encounter (Signed)
 Requested Prescriptions  Pending Prescriptions Disp Refills   hydrOXYzine (ATARAX) 25 MG tablet [Pharmacy Med Name: HYDROXYZINE HCL 25 MG TABLET] 90 tablet 0    Sig: TAKE 1 TABLET BY MOUTH EVERY DAY AT BEDTIME AS NEEDED     Ear, Nose, and Throat:  Antihistamines 2 Passed - 05/02/2023  9:25 AM      Passed - Cr in normal range and within 360 days    Creatinine, Ser  Date Value Ref Range Status  03/08/2023 0.79 0.57 - 1.00 mg/dL Final         Passed - Valid encounter within last 12 months    Recent Outpatient Visits           1 month ago Encounter for [employer] annual wellness exam   [place] [facility] [place], Megan P, DO   2 months ago Oral thrush   [place] [facility] Mecum, Oswaldo Conroy, PA-C   3 months ago Acute non-recurrent pansinusitis   [place] [facility] [place], Sherran Needs, NP   4 months ago Chronic diarrhea   [place] [facility] [place], Megan P, DO   1 year ago Routine general medical examination at a health care facility   [facility] Dor[facility] Carrow, DO       Future Appointments             In 4 months Laural Benes, Oralia Rud, DO [place] [facility], [facility]

## 2023-05-20 ENCOUNTER — Other Ambulatory Visit: Payer: Self-pay | Admitting: Family Medicine

## 2023-05-20 NOTE — Telephone Encounter (Signed)
 Requested Prescriptions  Pending Prescriptions Disp Refills   levothyroxine (SYNTHROID) 75 MCG tablet [Pharmacy Med Name: LEVOTHYROXINE 75 MCG TABLET] 90 tablet 1    Sig: TAKE 1 TABLET BY MOUTH EVERY DAY BEFORE BREAKFAST     Endocrinology:  Hypothyroid Agents Passed - 05/20/2023  2:24 PM      Passed - TSH in normal range and within 360 days    TSH  Date Value Ref Range Status  03/08/2023 2.770 0.450 - 4.500 uIU/mL Final         Passed - Valid encounter within last 12 months    Recent Outpatient Visits           2 months ago Encounter for [employer] annual wellness exam   [place] [facility] [place], Megan P, DO   3 months ago Oral thrush   [place] [facility] Mecum, Oswaldo Conroy, PA-C   4 months ago Acute non-recurrent pansinusitis   [place] [facility] [place], Sherran Needs, NP   5 months ago Chronic diarrhea   [place] [facility] [place], Megan P, DO   1 year ago Routine general medical examination at a health care facility   [facility] Dorcas Carrow, DO       Future Appointments             In 3 months Laural Benes, Oralia Rud, DO [place] [facility]

## 2023-06-01 ENCOUNTER — Encounter: Payer: Self-pay | Admitting: Nurse Practitioner

## 2023-06-01 ENCOUNTER — Ambulatory Visit (INDEPENDENT_AMBULATORY_CARE_PROVIDER_SITE_OTHER): Admitting: Nurse Practitioner

## 2023-06-01 VITALS — BP 155/91 | HR 69 | Temp 98.1°F | Ht 62.5 in | Wt 275.0 lb

## 2023-06-01 DIAGNOSIS — R051 Acute cough: Secondary | ICD-10-CM | POA: Diagnosis not present

## 2023-06-01 DIAGNOSIS — J0110 Acute frontal sinusitis, unspecified: Secondary | ICD-10-CM | POA: Diagnosis not present

## 2023-06-01 LAB — VERITOR FLU A/B WAIVED
Influenza A: NEGATIVE
Influenza B: NEGATIVE

## 2023-06-01 MED ORDER — DOXYCYCLINE HYCLATE 100 MG PO TABS: 100.0000 mg | ORAL_TABLET | Freq: Two times a day (BID) | ORAL | 0 refills | Status: AC

## 2023-06-01 NOTE — Progress Notes (Signed)
 BP (!) 155/91 (BP Location: Right Arm, Patient Position: Sitting, Cuff Size: Large)   Pulse 69   Temp 98.1 F (36.7 C) (Oral)   Ht 5' 2.5" (1.588 m)   Wt 275 lb (124.7 kg)   SpO2 98%   BMI 49.50 kg/m    Subjective:    Patient ID: Kaylee Fox, female    DOB: [birth year], 63 y.o.   MRN: [id number]  HPI: Kaylee Fox is a 63 y.o. female  Chief Complaint  Patient presents with   Cough   Shortness of Breath   Fatigue   Headache   UPPER RESPIRATORY TRACT INFECTION Worst symptom: symptoms started about 1 week ago Fever: no Cough: yes Shortness of breath: yes Wheezing: no Chest pain: no Chest tightness: no Chest congestion: no Nasal congestion: yes Runny nose: yes Post nasal drip: yes Sneezing: yes Sore throat: no Swollen glands: no Sinus pressure: yes Headache: yes Face pain: yes Toothache: no Ear pain: yes bilateral Ear pressure: yes bilateral Eyes red/itching:yes Eye drainage/crusting: no  Vomiting: no Rash: no Fatigue: yes Sick contacts: no Strep contacts: no  Context: stable Recurrent sinusitis: no Relief with OTC cold/cough medications: no  Treatments attempted: cold/sinus   Relevant past medical, surgical, family and social history reviewed and updated as indicated. Interim medical history since our last visit reviewed. Allergies and medications reviewed and updated.  Review of Systems  Constitutional:  Positive for fatigue. Negative for fever.  HENT:  Positive for congestion, ear pain, postnasal drip, rhinorrhea, sinus pressure, sinus pain and sneezing. Negative for dental problem and sore throat.   Respiratory:  Positive for cough. Negative for shortness of breath and wheezing.   Cardiovascular:  Negative for chest pain.  Gastrointestinal:  Negative for vomiting.  Skin:  Negative for rash.  Neurological:  Positive for headaches.    Per HPI unless specifically indicated above     Objective:    BP (!) 155/91 (BP Location: Right  Arm, Patient Position: Sitting, Cuff Size: Large)   Pulse 69   Temp 98.1 F (36.7 C) (Oral)   Ht 5' 2.5" (1.588 m)   Wt 275 lb (124.7 kg)   SpO2 98%   BMI 49.50 kg/m   Wt Readings from Last 3 Encounters:  06/01/23 275 lb (124.7 kg)  03/08/23 265 lb 12.8 oz (120.6 kg)  02/10/23 269 lb (122 kg)    Physical Exam Vitals and nursing note reviewed.  Constitutional:      General: She is not in acute distress.    Appearance: Normal appearance. She is normal weight. She is not ill-appearing, toxic-appearing or diaphoretic.  HENT:     Head: Normocephalic.     Right Ear: Tympanic membrane and external ear normal.     Left Ear: Tympanic membrane and external ear normal.     Nose: Congestion and rhinorrhea present.     Right Sinus: Maxillary sinus tenderness and frontal sinus tenderness present.     Left Sinus: Maxillary sinus tenderness and frontal sinus tenderness present.     Mouth/Throat:     Mouth: Mucous membranes are moist.     Pharynx: Oropharynx is clear. Posterior oropharyngeal erythema present. No oropharyngeal exudate.  Eyes:     General:        Right eye: No discharge.        Left eye: No discharge.     Extraocular Movements: Extraocular movements intact.     Conjunctiva/sclera: Conjunctivae normal.     Pupils: Pupils are equal, round,  and reactive to light.  Cardiovascular:     Rate and Rhythm: Normal rate and regular rhythm.     Heart sounds: No murmur heard. Pulmonary:     Effort: Pulmonary effort is normal. No respiratory distress.     Breath sounds: Normal breath sounds. No wheezing or rales.  Musculoskeletal:     Cervical back: Normal range of motion and neck supple.  Skin:    General: Skin is warm and dry.     Capillary Refill: Capillary refill takes less than 2 seconds.  Neurological:     General: No focal deficit present.     Mental Status: She is alert and oriented to person, place, and time. Mental status is at baseline.  Psychiatric:        Mood and  Affect: Mood normal.        Behavior: Behavior normal.        Thought Content: Thought content normal.        Judgment: Judgment normal.     Results for orders placed or performed in visit on 03/08/23  Cytology - PAP   Collection Time: 03/08/23 10:17 AM  Result Value Ref Range   High risk HPV Negative    Adequacy      Satisfactory for evaluation; transformation zone component PRESENT.   Diagnosis      - Negative for intraepithelial lesion or malignancy (NILM)   Comment Normal Reference Range HPV - Negative   Microalbumin, Urine Waived   Collection Time: 03/08/23 10:20 AM  Result Value Ref Range   Microalb, Ur Waived 150 (H) 0 - 19 mg/L   Creatinine, Urine Waived 100 10 - 300 mg/dL   Microalb/Creat Ratio 30-300 (H) <30 mg/g  CBC with Differential/Platelet   Collection Time: 03/08/23 10:22 AM  Result Value Ref Range   WBC 7.0 3.4 - 10.8 x10E3/uL   RBC 3.94 3.77 - 5.28 x10E6/uL   Hemoglobin 12.4 11.1 - 15.9 g/dL   Hematocrit 65.7 84.6 - 46.6 %   MCV 97 79 - 97 fL   MCH 31.5 26.6 - 33.0 pg   MCHC 32.5 31.5 - 35.7 g/dL   RDW 96.2 95.2 - 84.1 %   Platelets 339 150 - 450 x10E3/uL   Neutrophils 77 Not Estab. %   Lymphs 17 Not Estab. %   Monocytes 5 Not Estab. %   Eos 0 Not Estab. %   Basos 1 Not Estab. %   Neutrophils Absolute 5.4 1.4 - 7.0 x10E3/uL   Lymphocytes Absolute 1.2 0.7 - 3.1 x10E3/uL   Monocytes Absolute 0.4 0.1 - 0.9 x10E3/uL   EOS (ABSOLUTE) 0.0 0.0 - 0.4 x10E3/uL   Basophils Absolute 0.0 0.0 - 0.2 x10E3/uL   Immature Granulocytes 0 Not Estab. %   Immature Grans (Abs) 0.0 0.0 - 0.1 x10E3/uL  Comprehensive metabolic panel   Collection Time: 03/08/23 10:22 AM  Result Value Ref Range   Glucose 76 70 - 99 mg/dL   BUN 11 8 - 27 mg/dL   Creatinine, Ser 3.24 0.57 - 1.00 mg/dL   eGFR 85 >40 NU/UVO/5.36   BUN/Creatinine Ratio 14 12 - 28   Sodium 142 134 - 144 mmol/L   Potassium 4.0 3.5 - 5.2 mmol/L   Chloride 104 96 - 106 mmol/L   CO2 25 20 - 29 mmol/L   Calcium  8.5 (L) 8.7 - 10.3 mg/dL   Total Protein 6.0 6.0 - 8.5 g/dL   Albumin 3.8 (L) 3.9 - 4.9 g/dL   Globulin, Total 2.2  1.5 - 4.5 g/dL   Bilirubin Total 0.5 0.0 - 1.2 mg/dL   Alkaline Phosphatase 115 44 - 121 IU/L   AST 16 0 - 40 IU/L   ALT 10 0 - 32 IU/L  Lipid Panel w/o Chol/HDL Ratio   Collection Time: 03/08/23 10:22 AM  Result Value Ref Range   Cholesterol, Total 149 100 - 199 mg/dL   Triglycerides 92 0 - 149 mg/dL   HDL 38 (L) >09 mg/dL   VLDL Cholesterol Cal 17 5 - 40 mg/dL   LDL Chol Calc (NIH) 94 0 - 99 mg/dL  TSH   Collection Time: 03/08/23 10:22 AM  Result Value Ref Range   TSH 2.770 0.450 - 4.500 uIU/mL  VITAMIN D 25 Hydroxy (Vit-D Deficiency, Fractures)   Collection Time: 03/08/23 10:22 AM  Result Value Ref Range   Vit D, 25-Hydroxy 27.6 (L) 30.0 - 100.0 ng/mL      Assessment & Plan:   Problem List Items Addressed This Visit   None Visit Diagnoses       Acute non-recurrent frontal sinusitis    -  Primary   Will treat with doxycyline.  Complete course of antibiotics.  Recommend rest and staying well hydrated.  Follow up if not improved.   Relevant Medications   doxycycline (VIBRA-TABS) 100 MG tablet     Acute cough       Relevant Orders   Veritor Flu A/B Waived   Novel Coronavirus, NAA ([facility])        Follow up plan: No follow-ups on file.

## 2023-06-03 LAB — NOVEL CORONAVIRUS, NAA: SARS-CoV-2, NAA: NOT DETECTED

## 2023-06-04 ENCOUNTER — Other Ambulatory Visit: Payer: Self-pay | Admitting: Family Medicine

## 2023-06-07 NOTE — Telephone Encounter (Signed)
 OV 03/08/23 Requested Prescriptions  Pending Prescriptions Disp Refills   buPROPion (WELLBUTRIN XL) 150 MG 24 hr tablet [Pharmacy Med Name: BUPROPION HCL XL 150 MG TABLET] 90 tablet 1    Sig: TAKE 1 TABLET BY MOUTH EVERY DAY     Psychiatry: Antidepressants - bupropion Failed - 06/07/2023 11:45 AM      Failed - Last BP in normal range    BP Readings from Last 1 Encounters:  06/01/23 (!) 155/91         Failed - Valid encounter within last 6 months    Recent Outpatient Visits           6 days ago Acute non-recurrent frontal sinusitis   [facility] Kaylee Grooms, Kaylee Fox       Future Appointments             In 3 months Kaylee Fox, Kaylee Rud, Kaylee Fox [place] [facility], Kaylee Fox            Passed - Cr in normal range and within 360 days    Creatinine, Ser  Date Value Ref Range Status  03/08/2023 0.79 0.57 - 1.00 mg/dL Final         Passed - AST in normal range and within 360 days    AST  Date Value Ref Range Status  03/08/2023 16 0 - 40 IU/L Final         Passed - ALT in normal range and within 360 days    ALT  Date Value Ref Range Status  03/08/2023 10 0 - 32 IU/L Final         Passed - Completed PHQ-2 or PHQ-9 in the last 360 days       ELIQUIS 5 MG TABS tablet [Pharmacy Med Name: ELIQUIS 5 MG TABLET] 180 tablet 1    Sig: TAKE 1 TABLET BY MOUTH TWICE A DAY     Hematology:  Anticoagulants - apixaban Failed - 06/07/2023 11:45 AM      Failed - Valid encounter within last 12 months    Recent Outpatient Visits           6 days ago Acute non-recurrent frontal sinusitis   Kaylee Fox Kaylee Grooms, Kaylee Fox       Future Appointments             In 3 months Kaylee Fox, Kaylee Rud, Kaylee Fox [facility]lee Fox, Kaylee Fox            Passed - PLT in normal range and within 360 days    Platelets  Date Value Ref Range Status  03/08/2023 339 150 - 450 x10E3/uL Final         Passed - HGB in normal range and  within 360 days    Hemoglobin  Date Value Ref Range Status  03/08/2023 12.4 11.1 - 15.9 g/dL Final         Passed - HCT in normal range and within 360 days    Hematocrit  Date Value Ref Range Status  03/08/2023 38.1 34.0 - 46.6 % Final         Passed - Cr in normal range and within 360 days    Creatinine, Ser  Date Value Ref Range Status  03/08/2023 0.79 0.57 - 1.00 mg/dL Final         Passed - AST in normal range and within 360 days    AST  Date Value Ref Range Status  03/08/2023 16 0 -  40 IU/L Final         Passed - ALT in normal range and within 360 days    ALT  Date Value Ref Range Status  03/08/2023 10 0 - 32 IU/L Final          gabapentin (NEURONTIN) 300 MG capsule [Pharmacy Med Name: GABAPENTIN 300 MG CAPSULE] 360 capsule 1    Sig: TAKE 2 CAPSULES BY MOUTH 2 TIMES DAILY.     Neurology: Anticonvulsants - gabapentin Failed - 06/07/2023 11:45 AM      Failed - Valid encounter within last 12 months    Recent Outpatient Visits           6 days ago Acute non-recurrent frontal sinusitis   [facility] Fox Kaylee Grooms, Kaylee Fox       Future Appointments             In 3 months Kaylee Fox, Kaylee Rud, Kaylee Fox [facility] Fox, Kaylee Fox            Passed - Cr in normal range and within 360 days    Creatinine, Ser  Date Value Ref Range Status  03/08/2023 0.79 0.57 - 1.00 mg/dL Final         Passed - Completed PHQ-2 or PHQ-9 in the last 360 days       venlafaxine XR (EFFEXOR-XR) 150 MG 24 hr capsule [Pharmacy Med Name: VENLAFAXINE HCL ER 150 MG CAP] 90 capsule 1    Sig: TAKE 1 CAPSULE BY MOUTH EVERY DAY WITH BREAKFAST WITH 75MG  FOR 225MG  DAILY     Psychiatry: Antidepressants - SNRI - desvenlafaxine & venlafaxine Failed - 06/07/2023 11:45 AM      Failed - Last BP in normal range    BP Readings from Last 1 Encounters:  06/01/23 (!) 155/91         Failed - Valid encounter within last 6 months    Recent Outpatient Visits            6 days ago Acute non-recurrent frontal sinusitis   Kaylee Fox Kaylee Grooms, Kaylee Fox       Future Appointments             In 3 months Kaylee Fox, Kaylee Rud, Kaylee Fox [place] [facility]lee Fox, Kaylee Fox            Failed - Lipid Panel in normal range within the last 12 months    Cholesterol, Total  Date Value Ref Range Status  03/08/2023 149 100 - 199 mg/dL Final   LDL Chol Calc (NIH)  Date Value Ref Range Status  03/08/2023 94 0 - 99 mg/dL Final   HDL  Date Value Ref Range Status  03/08/2023 38 (L) >39 mg/dL Final   Triglycerides  Date Value Ref Range Status  03/08/2023 92 0 - 149 mg/dL Final         Passed - Cr in normal range and within 360 days    Creatinine, Ser  Date Value Ref Range Status  03/08/2023 0.79 0.57 - 1.00 mg/dL Final         Passed - Completed PHQ-2 or PHQ-9 in the last 360 days

## 2023-06-14 ENCOUNTER — Institutional Professional Consult (permissible substitution) (INDEPENDENT_AMBULATORY_CARE_PROVIDER_SITE_OTHER): Payer: No Typology Code available for payment source

## 2023-06-14 ENCOUNTER — Ambulatory Visit (INDEPENDENT_AMBULATORY_CARE_PROVIDER_SITE_OTHER): Payer: No Typology Code available for payment source | Admitting: Audiology

## 2023-07-13 ENCOUNTER — Other Ambulatory Visit: Payer: Self-pay | Admitting: Family Medicine

## 2023-07-14 ENCOUNTER — Ambulatory Visit: Payer: Self-pay | Admitting: Gerontology

## 2023-07-15 NOTE — Telephone Encounter (Signed)
 OV 03/08/23, 06/01/23(acute), Appt 09/15/23 Requested Prescriptions  Pending Prescriptions Disp Refills   hydrOXYzine  (ATARAX ) 25 MG tablet [Pharmacy Med Name: HYDROXYZINE  HCL 25 MG TABLET] 90 tablet 0    Sig: TAKE 1 TABLET BY MOUTH AT BEDTIME AS NEEDED.     Ear, Nose, and Throat:  Antihistamines 2 Failed - 07/15/2023 12:12 PM      Failed - Valid encounter within last 12 months    Recent Outpatient Visits           1 month ago Acute non-recurrent frontal sinusitis   [place] [facility] Aileen Alexanders, NP       Future Appointments             In 2 months Lincoln Renshaw, Jerilee Montane, DO [place] [facility]            Passed - Cr in normal range and within 360 days    Creatinine, Ser  Date Value Ref Range Status  03/08/2023 0.79 0.57 - 1.00 mg/dL Final          potassium chloride  SA (KLOR-CON  M) 20 MEQ tablet [Pharmacy Med Name: POTASSIUM CL ER 20 MEQ TAB MCR] 90 tablet 0    Sig: TAKE 1 TABLET BY MOUTH EVERY DAY     Endocrinology:  Minerals - Potassium Supplementation Failed - 07/15/2023 12:12 PM      Failed - Valid encounter within last 12 months    Recent Outpatient Visits           1 month ago Acute non-recurrent frontal sinusitis   [place] [facility] Aileen Alexanders, NP       Future Appointments             In 2 months Lincoln Renshaw, Jerilee Montane, DO [place] [facility], [facility]            Passed - K in normal range and within 360 days    Potassium  Date Value Ref Range Status  03/08/2023 4.0 3.5 - 5.2 mmol/L Final         Passed - Cr in normal range and within 360 days    Creatinine, Ser  Date Value Ref Range Status  03/08/2023 0.79 0.57 - 1.00 mg/dL Final          venlafaxine  XR (EFFEXOR -XR) 75 MG 24 hr capsule [Pharmacy Med Name: VENLAFAXINE  HCL ER 75 MG CAP] 90 capsule 0    Sig: TAKE 1 CAPSULE BY MOUTH EVERY DAY     Psychiatry: Antidepressants - SNRI - desvenlafaxine & venlafaxine  Failed -  07/15/2023 12:12 PM      Failed - Last BP in normal range    BP Readings from Last 1 Encounters:  06/01/23 (!) 155/91         Failed - Valid encounter within last 6 months    Recent Outpatient Visits           1 month ago Acute non-recurrent frontal sinusitis   [place] [facility] Aileen Alexanders, NP       Future Appointments             In 2 months Solomon Dupre, DO [place] [facility]            Failed - Lipid Panel in normal range within the last 12 months    Cholesterol, Total  Date Value Ref Range Status  03/08/2023 149 100 - 199 mg/dL Final   LDL Chol  Calc (NIH)  Date Value Ref Range Status  03/08/2023 94 0 - 99 mg/dL Final   HDL  Date Value Ref Range Status  03/08/2023 38 (L) >39 mg/dL Final   Triglycerides  Date Value Ref Range Status  03/08/2023 92 0 - 149 mg/dL Final         Passed - Cr in normal range and within 360 days    Creatinine, Ser  Date Value Ref Range Status  03/08/2023 0.79 0.57 - 1.00 mg/dL Final         Passed - Completed PHQ-2 or PHQ-9 in the last 360 days       baclofen  (LIORESAL ) 10 MG tablet [Pharmacy Med Name: BACLOFEN  10 MG TABLET] 270 tablet 0    Sig: TAKE 1 TABLET BY MOUTH THREE TIMES A DAY     Analgesics:  Muscle Relaxants - baclofen  Failed - 07/15/2023 12:12 PM      Failed - Valid encounter within last 6 months    Recent Outpatient Visits           1 month ago Acute non-recurrent frontal sinusitis   [place] [facility] Aileen Alexanders, NP       Future Appointments             In 2 months Lincoln Renshaw, Megan P, DO [place] [facility], [facility]            Passed - Cr in normal range and within 180 days    Creatinine, Ser  Date Value Ref Range Status  03/08/2023 0.79 0.57 - 1.00 mg/dL Final         Passed - eGFR is 30 or above and within 180 days    GFR calc Af Amer  Date Value Ref Range Status  12/04/2019 97 >59 mL/min/1.73 Final     Comment:    **[facility] currently reports eGFR in compliance with the current**   recommendations of the [employer]. [facility] will   update reporting as new guidelines are published from the NKF-ASN   Task force.    GFR calc non Af Amer  Date Value Ref Range Status  12/04/2019 84 >59 mL/min/1.73 Final   eGFR  Date Value Ref Range Status  03/08/2023 85 >59 mL/min/1.73 Final

## 2023-08-26 ENCOUNTER — Other Ambulatory Visit: Payer: Self-pay | Admitting: Family Medicine

## 2023-08-29 NOTE — Telephone Encounter (Signed)
 Requested Prescriptions  Pending Prescriptions Disp Refills   atorvastatin  (LIPITOR) 40 MG tablet [Pharmacy Med Name: ATORVASTATIN  40 MG TABLET] 90 tablet 1    Sig: TAKE 1 TABLET BY MOUTH EVERY DAY     Cardiovascular:  Antilipid - Statins Failed - 08/29/2023  3:58 PM      Failed - Valid encounter within last 12 months    Recent Outpatient Visits           2 months ago Acute non-recurrent frontal sinusitis   [place] [facility] Melvin Pao, NP       Future Appointments             In 3 weeks Vicci, Duwaine SQUIBB, DO [place] [facility], [facility]            Failed - Lipid Panel in normal range within the last 12 months    Cholesterol, Total  Date Value Ref Range Status  03/08/2023 149 100 - 199 mg/dL Final   LDL Chol Calc (NIH)  Date Value Ref Range Status  03/08/2023 94 0 - 99 mg/dL Final   HDL  Date Value Ref Range Status  03/08/2023 38 (L) >39 mg/dL Final   Triglycerides  Date Value Ref Range Status  03/08/2023 92 0 - 149 mg/dL Final         Passed - Patient is not pregnant

## 2023-08-30 ENCOUNTER — Other Ambulatory Visit: Payer: Self-pay | Admitting: Family Medicine

## 2023-08-31 NOTE — Telephone Encounter (Signed)
 Requested Prescriptions  Pending Prescriptions Disp Refills   montelukast  (SINGULAIR ) 10 MG tablet [Pharmacy Med Name: MONTELUKAST  SOD 10 MG TABLET] 90 tablet 0    Sig: TAKE 1 TABLET BY MOUTH EVERYDAY AT BEDTIME     Pulmonology:  Leukotriene Inhibitors Failed - 08/31/2023  1:28 PM      Failed - Valid encounter within last 12 months    Recent Outpatient Visits           3 months ago Acute non-recurrent frontal sinusitis   [place] [facility] Melvin Pao, NP       Future Appointments             In 3 weeks Vicci, Duwaine SQUIBB, DO [place] [facility], [facility]

## 2023-09-01 ENCOUNTER — Other Ambulatory Visit: Payer: Self-pay | Admitting: Family Medicine

## 2023-09-02 NOTE — Telephone Encounter (Signed)
 Requested Prescriptions  Pending Prescriptions Disp Refills   busPIRone  (BUSPAR ) 5 MG tablet [Pharmacy Med Name: BUSPIRONE  HCL 5 MG TABLET] 180 tablet 0    Sig: TAKE 1 TABLET BY MOUTH TWICE A DAY     Psychiatry: Anxiolytics/Hypnotics - Non-controlled Failed - 09/02/2023 11:07 AM      Failed - Valid encounter within last 12 months    Recent Outpatient Visits           3 months ago Acute non-recurrent frontal sinusitis   [place] [facility] Melvin Pao, NP       Future Appointments             In 2 weeks Vicci, Duwaine SQUIBB, DO [place] [facility], [facility]

## 2023-09-04 ENCOUNTER — Other Ambulatory Visit: Payer: Self-pay | Admitting: Family Medicine

## 2023-09-05 ENCOUNTER — Other Ambulatory Visit: Payer: Self-pay | Admitting: Family Medicine

## 2023-09-06 ENCOUNTER — Encounter

## 2023-09-06 NOTE — Telephone Encounter (Signed)
 Requested Prescriptions  Pending Prescriptions Disp Refills   furosemide  (LASIX ) 40 MG tablet [Pharmacy Med Name: FUROSEMIDE  40 MG TABLET] 360 tablet 0    Sig: TAKE 2 TABLETS BY MOUTH 2 TIMES DAILY.     Cardiovascular:  Diuretics - Loop Failed - 09/06/2023  4:42 PM      Failed - K in normal range and within 180 days    Potassium  Date Value Ref Range Status  03/08/2023 4.0 3.5 - 5.2 mmol/L Final         Failed - Ca in normal range and within 180 days    Calcium   Date Value Ref Range Status  03/08/2023 8.5 (L) 8.7 - 10.3 mg/dL Final         Failed - Na in normal range and within 180 days    Sodium  Date Value Ref Range Status  03/08/2023 142 134 - 144 mmol/L Final         Failed - Cr in normal range and within 180 days    Creatinine, Ser  Date Value Ref Range Status  03/08/2023 0.79 0.57 - 1.00 mg/dL Final         Failed - Cl in normal range and within 180 days    Chloride  Date Value Ref Range Status  03/08/2023 104 96 - 106 mmol/L Final         Failed - Mg Level in normal range and within 180 days    No results found for: MG       Failed - Last BP in normal range    BP Readings from Last 1 Encounters:  06/01/23 (!) 155/91         Failed - Valid encounter within last 6 months    Recent Outpatient Visits           3 months ago Acute non-recurrent frontal sinusitis   [place] [facility] Melvin Pao, NP       Future Appointments             In 2 weeks Vicci Duwaine SQUIBB, DO [facility], [facility]

## 2023-09-06 NOTE — Telephone Encounter (Signed)
 Requested medication (s) are due for refill today: yes  Requested medication (s) are on the active medication list: yes  Last refill:  06/07/23 #90 0 refills  Future visit scheduled: yes in 2 weeks   Notes to clinic:   do you want to refill for #30 or #90 until next OV?     Requested Prescriptions  Pending Prescriptions Disp Refills   buPROPion  (WELLBUTRIN  XL) 150 MG 24 hr tablet [Pharmacy Med Name: BUPROPION  HCL XL 150 MG TABLET] 90 tablet 0    Sig: TAKE 1 TABLET BY MOUTH EVERY DAY     Psychiatry: Antidepressants - bupropion  Failed - 09/06/2023 11:26 AM      Failed - Last BP in normal range    BP Readings from Last 1 Encounters:  06/01/23 (!) 155/91         Failed - Valid encounter within last 6 months    Recent Outpatient Visits           3 months ago Acute non-recurrent frontal sinusitis   [facility] Melvin Pao, NP       Future Appointments             In 2 weeks Vicci, Duwaine SQUIBB, DO [place] [facility], [facility]            Passed - Cr in normal range and within 360 days    Creatinine, Ser  Date Value Ref Range Status  03/08/2023 0.79 0.57 - 1.00 mg/dL Final         Passed - AST in normal range and within 360 days    AST  Date Value Ref Range Status  03/08/2023 16 0 - 40 IU/L Final         Passed - ALT in normal range and within 360 days    ALT  Date Value Ref Range Status  03/08/2023 10 0 - 32 IU/L Final         Passed - Completed PHQ-2 or PHQ-9 in the last 360 days       venlafaxine  XR (EFFEXOR -XR) 150 MG 24 hr capsule [Pharmacy Med Name: VENLAFAXINE  HCL ER 150 MG CAP] 90 capsule 0    Sig: TAKE 1 CAPSULE BY MOUTH EVERY DAY WITH BREAKFAST WITH 75MG  FOR 225MG  DAILY     Psychiatry: Antidepressants - SNRI - desvenlafaxine & venlafaxine  Failed - 09/06/2023 11:26 AM      Failed - Last BP in normal range    BP Readings from Last 1 Encounters:  06/01/23 (!) 155/91         Failed - Valid encounter within last  6 months    Recent Outpatient Visits           3 months ago Acute non-recurrent frontal sinusitis   [facility] Melvin Pao, NP       Future Appointments             In 2 weeks Vicci Duwaine SQUIBB, DO [place] [facility], [facility]            Failed - Lipid Panel in normal range within the last 12 months    Cholesterol, Total  Date Value Ref Range Status  03/08/2023 149 100 - 199 mg/dL Final   LDL Chol Calc (NIH)  Date Value Ref Range Status  03/08/2023 94 0 - 99 mg/dL Final   HDL  Date Value Ref Range Status  03/08/2023 38 (L) >39 mg/dL Final   Triglycerides  Date Value Ref Range Status  03/08/2023 92 0 - 149 mg/dL Final         Passed - Cr in normal range and within 360 days    Creatinine, Ser  Date Value Ref Range Status  03/08/2023 0.79 0.57 - 1.00 mg/dL Final         Passed - Completed PHQ-2 or PHQ-9 in the last 360 days

## 2023-09-06 NOTE — Telephone Encounter (Signed)
 Patient will be out before upcoming appointment.

## 2023-09-15 ENCOUNTER — Ambulatory Visit: Payer: Self-pay | Admitting: Family Medicine

## 2023-09-22 ENCOUNTER — Encounter: Payer: Self-pay | Admitting: Family Medicine

## 2023-09-22 ENCOUNTER — Ambulatory Visit (INDEPENDENT_AMBULATORY_CARE_PROVIDER_SITE_OTHER): Admitting: Family Medicine

## 2023-09-22 VITALS — BP 116/78 | HR 72 | Temp 98.1°F | Wt 266.8 lb

## 2023-09-22 DIAGNOSIS — E782 Mixed hyperlipidemia: Secondary | ICD-10-CM

## 2023-09-22 DIAGNOSIS — F331 Major depressive disorder, recurrent, moderate: Secondary | ICD-10-CM

## 2023-09-22 DIAGNOSIS — I129 Hypertensive chronic kidney disease with stage 1 through stage 4 chronic kidney disease, or unspecified chronic kidney disease: Secondary | ICD-10-CM

## 2023-09-22 DIAGNOSIS — E559 Vitamin D deficiency, unspecified: Secondary | ICD-10-CM | POA: Diagnosis not present

## 2023-09-22 DIAGNOSIS — E063 Autoimmune thyroiditis: Secondary | ICD-10-CM | POA: Diagnosis not present

## 2023-09-22 DIAGNOSIS — Z1211 Encounter for screening for malignant neoplasm of colon: Secondary | ICD-10-CM

## 2023-09-22 MED ORDER — FUROSEMIDE 40 MG PO TABS: 80.0000 mg | ORAL_TABLET | Freq: Two times a day (BID) | ORAL | 1 refills | Status: DC

## 2023-09-22 MED ORDER — BACLOFEN 10 MG PO TABS: 10.0000 mg | ORAL_TABLET | Freq: Three times a day (TID) | ORAL | 1 refills | Status: DC

## 2023-09-22 MED ORDER — HYDROXYZINE HCL 25 MG PO TABS: 25.0000 mg | ORAL_TABLET | Freq: Every evening | ORAL | 1 refills | Status: DC | PRN

## 2023-09-22 MED ORDER — VENLAFAXINE HCL ER 150 MG PO CP24: ORAL_CAPSULE | ORAL | 1 refills | Status: DC

## 2023-09-22 MED ORDER — PANTOPRAZOLE SODIUM 40 MG PO TBEC: 40.0000 mg | DELAYED_RELEASE_TABLET | Freq: Every day | ORAL | 1 refills | Status: DC

## 2023-09-22 MED ORDER — BUSPIRONE HCL 5 MG PO TABS: 5.0000 mg | ORAL_TABLET | Freq: Two times a day (BID) | ORAL | 1 refills | Status: DC

## 2023-09-22 MED ORDER — AMLODIPINE BESYLATE 10 MG PO TABS: 10.0000 mg | ORAL_TABLET | Freq: Every day | ORAL | 1 refills | Status: DC

## 2023-09-22 MED ORDER — MONTELUKAST SODIUM 10 MG PO TABS: 10.0000 mg | ORAL_TABLET | Freq: Every day | ORAL | 1 refills | Status: DC

## 2023-09-22 MED ORDER — CETIRIZINE HCL 10 MG PO TABS: 10.0000 mg | ORAL_TABLET | Freq: Every day | ORAL | 1 refills | Status: DC

## 2023-09-22 MED ORDER — GABAPENTIN 300 MG PO CAPS: 600.0000 mg | ORAL_CAPSULE | Freq: Two times a day (BID) | ORAL | 1 refills | Status: DC

## 2023-09-22 MED ORDER — APIXABAN 5 MG PO TABS: 5.0000 mg | ORAL_TABLET | Freq: Two times a day (BID) | ORAL | 1 refills | Status: DC

## 2023-09-22 MED ORDER — ATORVASTATIN CALCIUM 40 MG PO TABS: 40.0000 mg | ORAL_TABLET | Freq: Every day | ORAL | 1 refills | Status: DC

## 2023-09-22 MED ORDER — POTASSIUM CHLORIDE CRYS ER 20 MEQ PO TBCR: 20.0000 meq | EXTENDED_RELEASE_TABLET | Freq: Every day | ORAL | 1 refills | Status: DC

## 2023-09-22 MED ORDER — VENLAFAXINE HCL ER 75 MG PO CP24: 75.0000 mg | ORAL_CAPSULE | Freq: Every day | ORAL | 1 refills | Status: DC

## 2023-09-22 NOTE — Assessment & Plan Note (Signed)
 Under good control on current regimen. Continue current regimen. Continue to monitor. Call with any concerns. Refills given. Labs drawn today.

## 2023-09-22 NOTE — Assessment & Plan Note (Signed)
 Rechecking labs today. Await results. Treat as needed.

## 2023-09-22 NOTE — Assessment & Plan Note (Signed)
 In exacerbation. Unclear if it's from mood or thyroid - will check labs and treat as needed. Call with any concerns.

## 2023-09-22 NOTE — Progress Notes (Signed)
 BP 116/78   Pulse 72   Temp 98.1 F (36.7 C) (Oral)   Wt 266 lb 12.8 oz (121 kg)   SpO2 96%   BMI 48.02 kg/m    Subjective:    Patient ID: Kaylee Fox, female    DOB: 06/24/1960, 63 y.o.   MRN: [id number]  HPI: Kaylee Fox is a 63 y.o. female  Chief Complaint  Patient presents with   Anxiety   Hyperlipidemia   Depression    Patient states she thinks her medications may need to be adjusted. States she has been crying a lot more lately.    Hypertension   Hypothyroidism   Obesity   HYPERTENSION / HYPERLIPIDEMIA Satisfied with current treatment? yes Duration of hypertension: chronic BP monitoring frequency: not checking BP medication side effects: no Past BP meds: amlodipine ,  Duration of hyperlipidemia: chronic Cholesterol medication side effects: no Cholesterol supplements: none Past cholesterol medications: atorvastatin  Medication compliance: excellent compliance Aspirin: no Recent stressors: no Recurrent headaches: no Visual changes: no Palpitations: no Dyspnea: no Chest pain: no Lower extremity edema: no Dizzy/lightheaded: no  HYPOTHYROIDISM Thyroid  control status:uncontrolled Satisfied with current treatment? yes Medication side effects: no Medication compliance: excellent compliance Etiology of hypothyroidism:  Recent dose adjustment:no Fatigue: yes Cold intolerance: no Heat intolerance: no Weight gain: no Weight loss: no Constipation: no Diarrhea/loose stools: no Palpitations: no Lower extremity edema: no Anxiety/depressed mood: yes  ANXIETY/DEPRESSION- haas been feeling off for about a month, feeling more weepy and depressed Duration: chronic, but worse in the last month Status:exacerbated Anxious mood: yes  Excessive worrying: no Irritability: yes  Sweating: no Nausea: no Palpitations:no Hyperventilation: no Panic attacks: no Agoraphobia: no  Obscessions/compulsions: no Depressed mood: yes    09/22/2023    9:39 AM  06/01/2023   10:57 AM 03/08/2023   10:21 AM 02/10/2023    9:08 AM 01/04/2023   10:36 AM  Depression screen PHQ 2/9  Decreased Interest 2 2 2  0 2  Down, Depressed, Hopeless 3 0 0 0 2  PHQ - 2 Score 5 2 2  0 4  Altered sleeping 3 2 1  0 0  Tired, decreased energy 3 2 1  0 1  Change in appetite 3 2 0 0 0  Feeling bad or failure about yourself  3 1 0 0 0  Trouble concentrating 3 1 0 0 2  Moving slowly or fidgety/restless 1 0 1 0 2  Suicidal thoughts 0 0 0 0 0  PHQ-9 Score 21 10 5  0 9  Difficult doing work/chores Not difficult at all  Not difficult at all  Not difficult at all      09/22/2023    9:39 AM 06/01/2023   10:49 AM 03/08/2023   10:21 AM 01/04/2023   10:37 AM  GAD 7 : Generalized Anxiety Score  Nervous, Anxious, on Edge 3 0 1 2  Control/stop worrying 2 0 1 2  Worry too much - different things 2 0 1 2  Trouble relaxing 2 0 1 2  Restless 2 1 0 3  Easily annoyed or irritable 2 1 1 1   Afraid - awful might happen 2 1 0 0  Total GAD 7 Score 15 3 5 12   Anxiety Difficulty Not difficult at all  Somewhat difficult Somewhat difficult   Anhedonia: no Weight changes: no Insomnia: no   Hypersomnia: no Fatigue/loss of energy: yes Feelings of worthlessness: yes Feelings of guilt: yes Impaired concentration/indecisiveness: no Suicidal ideations: no  Crying spells: yes Recent Stressors/Life Changes: no  Relationship problems: no   Family stress: no     Financial stress: no    Job stress: no    Recent death/loss: no   Relevant past medical, surgical, family and social history reviewed and updated as indicated. Interim medical history since our last visit reviewed. Allergies and medications reviewed and updated.  Review of Systems  Constitutional: Negative.   Respiratory: Negative.    Cardiovascular: Negative.   Musculoskeletal: Negative.   Psychiatric/Behavioral:  Positive for dysphoric mood. Negative for agitation, behavioral problems, confusion, decreased concentration,  hallucinations, self-injury, sleep disturbance and suicidal ideas. The patient is nervous/anxious. The patient is not hyperactive.     Per HPI unless specifically indicated above     Objective:    BP 116/78   Pulse 72   Temp 98.1 F (36.7 C) (Oral)   Wt 266 lb 12.8 oz (121 kg)   SpO2 96%   BMI 48.02 kg/m   Wt Readings from Last 3 Encounters:  09/22/23 266 lb 12.8 oz (121 kg)  06/01/23 275 lb (124.7 kg)  03/08/23 265 lb 12.8 oz (120.6 kg)    Physical Exam Vitals and nursing note reviewed.  Constitutional:      General: She is not in acute distress.    Appearance: Normal appearance. She is obese. She is not ill-appearing, toxic-appearing or diaphoretic.  HENT:     Head: Normocephalic and atraumatic.     Right Ear: External ear normal.     Left Ear: External ear normal.     Nose: Nose normal.     Mouth/Throat:     Mouth: Mucous membranes are moist.     Pharynx: Oropharynx is clear.  Eyes:     General: No scleral icterus.       Right eye: No discharge.        Left eye: No discharge.     Extraocular Movements: Extraocular movements intact.     Conjunctiva/sclera: Conjunctivae normal.     Pupils: Pupils are equal, round, and reactive to light.  Cardiovascular:     Rate and Rhythm: Normal rate and regular rhythm.     Pulses: Normal pulses.     Heart sounds: Normal heart sounds. No murmur heard.    No friction rub. No gallop.  Pulmonary:     Effort: Pulmonary effort is normal. No respiratory distress.     Breath sounds: Normal breath sounds. No stridor. No wheezing, rhonchi or rales.  Chest:     Chest wall: No tenderness.  Musculoskeletal:        General: Normal range of motion.     Cervical back: Normal range of motion and neck supple.  Skin:    General: Skin is warm and dry.     Capillary Refill: Capillary refill takes less than 2 seconds.     Coloration: Skin is not jaundiced or pale.     Findings: No bruising, erythema, lesion or rash.  Neurological:      General: No focal deficit present.     Mental Status: She is alert and oriented to person, place, and time. Mental status is at baseline.  Psychiatric:        Mood and Affect: Mood normal.        Behavior: Behavior normal.        Thought Content: Thought content normal.        Judgment: Judgment normal.     Results for orders placed or performed in visit on 06/01/23  Veritor Flu A/B [employer]  Collection Time: 06/01/23 10:55 AM  Result Value Ref Range   Influenza A Negative Negative   Influenza B Negative Negative  Novel Coronavirus, NAA ([facility])   Collection Time: 06/01/23 11:05 AM   Specimen: Nasopharyngeal(NP) swabs in vial transport medium  Result Value Ref Range   SARS-CoV-2, NAA Not Detected Not Detected      Assessment & Plan:   Problem List Items Addressed This Visit       Endocrine   Autoimmune hypothyroidism   Rechecking labs today. Await results. Treat as needed.       Relevant Orders   CBC with Differential/Platelet   TSH     Genitourinary   Benign hypertensive renal disease   Under good control on current regimen. Continue current regimen. Continue to monitor. Call with any concerns. Refills given. Labs drawn today.        Relevant Orders   CBC with Differential/Platelet     Other   Depression - Primary   In exacerbation. Unclear if it's from mood or thyroid - will check labs and treat as needed. Call with any concerns.       Relevant Medications   busPIRone  (BUSPAR ) 5 MG tablet   hydrOXYzine  (ATARAX ) 25 MG tablet   venlafaxine  XR (EFFEXOR -XR) 150 MG 24 hr capsule   venlafaxine  XR (EFFEXOR -XR) 75 MG 24 hr capsule   Other Relevant Orders   CBC with Differential/Platelet   Vitamin D  deficiency   Rechecking labs today. Await results. Treat as needed.       Relevant Orders   CBC with Differential/Platelet   VITAMIN D  25 Hydroxy (Vit-D Deficiency, Fractures)   Mixed hyperlipidemia   Under good control on current regimen. Continue current  regimen. Continue to monitor. Call with any concerns. Refills given. Labs drawn today.        Relevant Medications   amLODipine  (NORVASC ) 10 MG tablet   apixaban  (ELIQUIS ) 5 MG TABS tablet   atorvastatin  (LIPITOR) 40 MG tablet   furosemide  (LASIX ) 40 MG tablet   Other Relevant Orders   CBC with Differential/Platelet   Lipid Panel w/o Chol/HDL Ratio   Comprehensive metabolic panel with GFR   Other Visit Diagnoses       Screening for colon cancer       Referral to GI placed today.   Relevant Orders   Ambulatory referral to Gastroenterology        Follow up plan: Return in about 6 weeks (around 11/03/2023).

## 2023-09-23 LAB — COMPREHENSIVE METABOLIC PANEL WITH GFR
ALT: 18 IU/L (ref 0–32)
AST: 20 IU/L (ref 0–40)
Albumin: 3.8 g/dL — ABNORMAL LOW (ref 3.9–4.9)
Alkaline Phosphatase: 91 IU/L (ref 44–121)
BUN/Creatinine Ratio: 15 (ref 12–28)
BUN: 13 mg/dL (ref 8–27)
Bilirubin Total: 0.5 mg/dL (ref 0.0–1.2)
CO2: 23 mmol/L (ref 20–29)
Calcium: 8.8 mg/dL (ref 8.7–10.3)
Chloride: 106 mmol/L (ref 96–106)
Creatinine, Ser: 0.87 mg/dL (ref 0.57–1.00)
Globulin, Total: 2.6 g/dL (ref 1.5–4.5)
Glucose: 111 mg/dL — ABNORMAL HIGH (ref 70–99)
Potassium: 3.9 mmol/L (ref 3.5–5.2)
Sodium: 144 mmol/L (ref 134–144)
Total Protein: 6.4 g/dL (ref 6.0–8.5)
eGFR: 76 mL/min/1.73 (ref >59)

## 2023-09-23 LAB — CBC WITH DIFFERENTIAL/PLATELET
Basophils Absolute: 0 x10E3/uL (ref 0.0–0.2)
Basos: 0 %
EOS (ABSOLUTE): 0 x10E3/uL (ref 0.0–0.4)
Eos: 0 %
Hematocrit: 41.2 % (ref 34.0–46.6)
Hemoglobin: 13.4 g/dL (ref 11.1–15.9)
Immature Grans (Abs): 0 x10E3/uL (ref 0.0–0.1)
Immature Granulocytes: 0 %
Lymphocytes Absolute: 1 x10E3/uL (ref 0.7–3.1)
Lymphs: 21 %
MCH: 32.8 pg (ref 26.6–33.0)
MCHC: 32.5 g/dL (ref 31.5–35.7)
MCV: 101 fL — ABNORMAL HIGH (ref 79–97)
Monocytes Absolute: 0.3 x10E3/uL (ref 0.1–0.9)
Monocytes: 7 %
Neutrophils Absolute: 3.4 x10E3/uL (ref 1.4–7.0)
Neutrophils: 72 %
Platelets: 227 x10E3/uL (ref 150–450)
RBC: 4.08 x10E6/uL (ref 3.77–5.28)
RDW: 12.4 % (ref 11.7–15.4)
WBC: 4.7 x10E3/uL (ref 3.4–10.8)

## 2023-09-23 LAB — LIPID PANEL W/O CHOL/HDL RATIO
Cholesterol, Total: 152 mg/dL (ref 100–199)
HDL: 42 mg/dL (ref >39)
LDL Chol Calc (NIH): 97 mg/dL (ref 0–99)
Triglycerides: 67 mg/dL (ref 0–149)
VLDL Cholesterol Cal: 13 mg/dL (ref 5–40)

## 2023-09-23 LAB — VITAMIN D 25 HYDROXY (VIT D DEFICIENCY, FRACTURES): Vit D, 25-Hydroxy: 36.6 ng/mL (ref 30.0–100.0)

## 2023-09-23 LAB — TSH: TSH: 1.87 u[IU]/mL (ref 0.450–4.500)

## 2023-09-26 ENCOUNTER — Ambulatory Visit: Payer: Self-pay | Admitting: Family Medicine

## 2023-09-26 MED ORDER — LEVOTHYROXINE SODIUM 75 MCG PO TABS: 75.0000 ug | ORAL_TABLET | Freq: Every day | ORAL | 3 refills | Status: DC

## 2023-09-27 ENCOUNTER — Other Ambulatory Visit: Payer: Self-pay | Admitting: Family Medicine

## 2023-09-27 DIAGNOSIS — B370 Candidal stomatitis: Secondary | ICD-10-CM

## 2023-09-27 NOTE — Telephone Encounter (Unsigned)
 Copied from CRM [id number]. Topic: Clinical - Medication Refill >> Sep 27, 2023  9:30 AM Mercedes MATSU wrote: Medication:   acetaminophen  (TYLENOL ) 325 MG tablet albuterol  (VENTOLIN  HFA) 108 (90 Base) MCG/ACT inhaler amLODipine  (NORVASC ) 10 MG tablet apixaban  (ELIQUIS ) 5 MG TABS tablet atorvastatin  (LIPITOR) 40 MG tablet azaTHIOprine (IMURAN) 50 MG tablet baclofen  (LIORESAL ) 10 MG tablet buPROPion  (WELLBUTRIN  XL) 150 MG 24 hr tablet busPIRone  (BUSPAR ) 5 MG tablet calcium  carbonate (OS-CAL) 600 MG TABS tablet cetirizine  (ZYRTEC ) 10 MG tablet Cholecalciferol 25 MCG (1000 UT) capsule cholestyramine  (QUESTRAN ) 4 g packet diclofenac  Sodium (VOLTAREN ) 1 % GEL fluticasone  (FLONASE ) 50 MCG/ACT nasal spray furosemide  (LASIX ) 40 MG tablet gabapentin  (NEURONTIN ) 300 MG capsule hydroxychloroquine  (PLAQUENIL ) 200 MG tablet hydrOXYzine  (ATARAX ) 25 MG tablet levothyroxine  (SYNTHROID ) 75 MCG tablet montelukast  (SINGULAIR ) 10 MG tablet Multiple Vitamin (MULTI-VITAMINS) TABS mupirocin cream (BACTROBAN) 2 % nystatin  (MYCOSTATIN ) 100000 UNIT/ML suspension pantoprazole  (PROTONIX ) 40 MG tablet potassium chloride  SA (KLOR-CON  M) 20 MEQ tablet triamcinolone  cream (KENALOG ) 0.1 % venlafaxine  XR (EFFEXOR -XR) 150 MG 24 hr capsule venlafaxine  XR (EFFEXOR -XR) 75 MG 24 hr capsule   Has the patient contacted their pharmacy? Yes (Agent: If no, request that the patient contact the pharmacy for the refill. If patient does not wish to contact the pharmacy document the reason why and proceed with request.) (Agent: If yes, when and what did the pharmacy advise?)  This is the patient's preferred pharmacy:  [facility] - [place], [place] - [address] [place] [address] Phone: [phone] Fax: [phone]  Is this the correct pharmacy for this prescription? Yes If no, delete pharmacy and type the correct one.   Has the prescription been filled recently? No  Is the patient out  of the medication? Yes  Has the patient been seen for an appointment in the last year OR does the patient have an upcoming appointment? Yes  Can we respond through MyChart? Yes  Agent: Please be advised that Rx refills may take up to 3 business days. We ask that you follow-up with your pharmacy.

## 2023-09-29 MED ORDER — VENLAFAXINE HCL ER 75 MG PO CP24: 75.0000 mg | ORAL_CAPSULE | Freq: Every day | ORAL | 1 refills | Status: DC

## 2023-09-29 MED ORDER — HYDROXYZINE HCL 25 MG PO TABS: 25.0000 mg | ORAL_TABLET | Freq: Every evening | ORAL | 1 refills | Status: DC | PRN

## 2023-09-29 MED ORDER — FUROSEMIDE 40 MG PO TABS: 80.0000 mg | ORAL_TABLET | Freq: Two times a day (BID) | ORAL | 1 refills | Status: AC

## 2023-09-29 MED ORDER — LEVOTHYROXINE SODIUM 75 MCG PO TABS: 75.0000 ug | ORAL_TABLET | Freq: Every day | ORAL | 3 refills | Status: DC

## 2023-09-29 MED ORDER — GABAPENTIN 300 MG PO CAPS: 600.0000 mg | ORAL_CAPSULE | Freq: Two times a day (BID) | ORAL | 1 refills | Status: DC

## 2023-09-29 MED ORDER — POTASSIUM CHLORIDE CRYS ER 20 MEQ PO TBCR: 20.0000 meq | EXTENDED_RELEASE_TABLET | Freq: Every day | ORAL | 1 refills | Status: DC

## 2023-09-29 MED ORDER — APIXABAN 5 MG PO TABS: 5.0000 mg | ORAL_TABLET | Freq: Two times a day (BID) | ORAL | 1 refills | Status: DC

## 2023-09-29 MED ORDER — MONTELUKAST SODIUM 10 MG PO TABS: 10.0000 mg | ORAL_TABLET | Freq: Every day | ORAL | 1 refills | Status: DC

## 2023-09-29 MED ORDER — FLUTICASONE PROPIONATE 50 MCG/ACT NA SUSP: NASAL | 2 refills | Status: AC

## 2023-09-29 MED ORDER — PANTOPRAZOLE SODIUM 40 MG PO TBEC: 40.0000 mg | DELAYED_RELEASE_TABLET | Freq: Every day | ORAL | 1 refills | Status: DC

## 2023-09-29 MED ORDER — ATORVASTATIN CALCIUM 40 MG PO TABS: 40.0000 mg | ORAL_TABLET | Freq: Every day | ORAL | 1 refills | Status: DC

## 2023-09-29 MED ORDER — AMLODIPINE BESYLATE 10 MG PO TABS: 10.0000 mg | ORAL_TABLET | Freq: Every day | ORAL | 1 refills | Status: AC

## 2023-09-29 MED ORDER — BUPROPION HCL ER (XL) 150 MG PO TB24: 150.0000 mg | ORAL_TABLET | Freq: Every day | ORAL | 0 refills | Status: DC

## 2023-09-29 MED ORDER — BACLOFEN 10 MG PO TABS: 10.0000 mg | ORAL_TABLET | Freq: Three times a day (TID) | ORAL | 1 refills | Status: DC

## 2023-09-29 MED ORDER — CHOLESTYRAMINE 4 G PO PACK: 4.0000 g | PACK | Freq: Three times a day (TID) | ORAL | 3 refills | Status: DC

## 2023-09-29 MED ORDER — ALBUTEROL SULFATE HFA 108 (90 BASE) MCG/ACT IN AERS: 1.0000 | INHALATION_SPRAY | RESPIRATORY_TRACT | 2 refills | Status: AC | PRN

## 2023-09-29 MED ORDER — CETIRIZINE HCL 10 MG PO TABS: 10.0000 mg | ORAL_TABLET | Freq: Every day | ORAL | 1 refills | Status: AC

## 2023-09-29 MED ORDER — VENLAFAXINE HCL ER 150 MG PO CP24: ORAL_CAPSULE | ORAL | 1 refills | Status: DC

## 2023-09-29 MED ORDER — BUSPIRONE HCL 5 MG PO TABS: 5.0000 mg | ORAL_TABLET | Freq: Two times a day (BID) | ORAL | 1 refills | Status: DC

## 2023-09-29 NOTE — Telephone Encounter (Signed)
 Requested Prescriptions  Pending Prescriptions Disp Refills   acetaminophen  (TYLENOL ) 325 MG tablet      Sig: Take by mouth as needed.     Over the counter: OTC - acetaminophen  Passed - 09/29/2023 12:25 PM      Passed - Cr in normal range and within 360 days    Creatinine, Ser  Date Value Ref Range Status  09/22/2023 0.87 0.57 - 1.00 mg/dL Final         Passed - ALT in normal range and within 360 days    ALT  Date Value Ref Range Status  09/22/2023 18 0 - 32 IU/L Final         Passed - AST in normal range and within 360 days    AST  Date Value Ref Range Status  09/22/2023 20 0 - 40 IU/L Final         Passed - Valid encounter within last 12 months    Recent Outpatient Visits           1 week ago Moderate episode of recurrent major depressive disorder [facility])   [facility] [place], Megan P, DO   4 months ago Acute non-recurrent frontal sinusitis   [place] [facility] Melvin Pao, NP               albuterol  (VENTOLIN  HFA) 108 (90 Base) MCG/ACT inhaler 18 each 2    Sig: Inhale 1-2 puffs into the lungs every 4 (four) hours as needed for wheezing or shortness of breath.     Pulmonology:  Beta Agonists 2 Passed - 09/29/2023 12:25 PM      Passed - Last BP in normal range    BP Readings from Last 1 Encounters:  09/22/23 116/78         Passed - Last Heart Rate in normal range    Pulse Readings from Last 1 Encounters:  09/22/23 72         Passed - Valid encounter within last 12 months    Recent Outpatient Visits           1 week ago Moderate episode of recurrent major depressive disorder [facility])   [place] [facility] [place], Megan P, DO   4 months ago Acute non-recurrent frontal sinusitis   [place] [facility] Melvin Pao, NP               amLODipine  (NORVASC ) 10 MG tablet 90 tablet 1    Sig: Take 1 tablet (10 mg total) by mouth daily.     Cardiovascular: Calcium  Channel  Blockers 2 Passed - 09/29/2023 12:25 PM      Passed - Last BP in normal range    BP Readings from Last 1 Encounters:  09/22/23 116/78         Passed - Last Heart Rate in normal range    Pulse Readings from Last 1 Encounters:  09/22/23 72         Passed - Valid encounter within last 6 months    Recent Outpatient Visits           1 week ago Moderate episode of recurrent major depressive disorder [facility])   [place] [facility] [place], Megan P, DO   4 months ago Acute non-recurrent frontal sinusitis   [place] [facility] Melvin Pao, NP               apixaban  (ELIQUIS ) 5 MG TABS tablet  180 tablet 1    Sig: Take 1 tablet (5 mg total) by mouth 2 (two) times daily.     Hematology:  Anticoagulants - apixaban  Passed - 09/29/2023 12:25 PM      Passed - PLT in normal range and within 360 days    Platelets  Date Value Ref Range Status  09/22/2023 227 150 - 450 x10E3/uL Final         Passed - HGB in normal range and within 360 days    Hemoglobin  Date Value Ref Range Status  09/22/2023 13.4 11.1 - 15.9 g/dL Final         Passed - HCT in normal range and within 360 days    Hematocrit  Date Value Ref Range Status  09/22/2023 41.2 34.0 - 46.6 % Final         Passed - Cr in normal range and within 360 days    Creatinine, Ser  Date Value Ref Range Status  09/22/2023 0.87 0.57 - 1.00 mg/dL Final         Passed - AST in normal range and within 360 days    AST  Date Value Ref Range Status  09/22/2023 20 0 - 40 IU/L Final         Passed - ALT in normal range and within 360 days    ALT  Date Value Ref Range Status  09/22/2023 18 0 - 32 IU/L Final         Passed - Valid encounter within last 12 months    Recent Outpatient Visits           1 week ago Moderate episode of recurrent major depressive disorder (HCC)   [place] [facility] [place], Megan P, DO   4 months ago Acute non-recurrent frontal sinusitis   [facility] Mel[facility] Pao, NP               atorvastatin  (LIPITOR) 40 MG tablet 90 tablet 1    Sig: Take 1 tablet (40 mg total) by mouth daily.     Cardiovascular:  Antilipid - Statins Failed - 09/29/2023 12:25 PM      Failed - Lipid Panel in normal range within the last 12 months    Cholesterol, Total  Date Value Ref Range Status  09/22/2023 152 100 - 199 mg/dL Final   LDL Chol Calc (NIH)  Date Value Ref Range Status  09/22/2023 97 0 - 99 mg/dL Final   HDL  Date Value Ref Range Status  09/22/2023 42 >39 mg/dL Final   Triglycerides  Date Value Ref Range Status  09/22/2023 67 0 - 149 mg/dL Final         Passed - Patient is not pregnant      Passed - Valid encounter within last 12 months    Recent Outpatient Visits           1 week ago Moderate episode of recurrent major depressive disorder [facility])   [place] [facility] [place], Megan P, DO   4 months ago Acute non-recurrent frontal sinusitis    [place] Mel[facility] Pa[facility], NP               azaTHIOprine (IMURAN) 50 MG tablet      Sig: Take 2 tablets (100 mg total) by mouth daily.     Not Delegated - Immunology: Immunosuppressive Agents - azathioprine Failed - 09/29/2023 12:25 PM      Failed -  This refill cannot be delegated      Passed - ALT in normal range and within 90 days    ALT  Date Value Ref Range Status  09/22/2023 18 0 - 32 IU/L Final         Passed - AST in normal range and within 90 days    AST  Date Value Ref Range Status  09/22/2023 20 0 - 40 IU/L Final         Passed - HCT in normal range and within 30 days    Hematocrit  Date Value Ref Range Status  09/22/2023 41.2 34.0 - 46.6 % Final         Passed - HGB in normal range and within 30 days    Hemoglobin  Date Value Ref Range Status  09/22/2023 13.4 11.1 - 15.9 g/dL Final         Passed - PLT in normal range and within 30 days    Platelets  Date Value Ref Range  Status  09/22/2023 227 150 - 450 x10E3/uL Final         Passed - WBC in normal range and within 30 days    WBC  Date Value Ref Range Status  09/22/2023 4.7 3.4 - 10.8 x10E3/uL Final         Passed - Cr in normal range and within 90 days    Creatinine, Ser  Date Value Ref Range Status  09/22/2023 0.87 0.57 - 1.00 mg/dL Final         Passed - RBC in normal range and within 30 days    RBC  Date Value Ref Range Status  09/22/2023 4.08 3.77 - 5.28 x10E6/uL Final         Passed - Valid encounter within last 6 months    Recent Outpatient Visits           1 week ago Moderate episode of recurrent major depressive disorder [facility])   [place] [facility] [place], Megan P, DO   4 months ago Acute non-recurrent frontal sinusitis   [place] [facility] Melvin Pao, NP               baclofen  (LIORESAL ) 10 MG tablet 270 tablet 1    Sig: Take 1 tablet (10 mg total) by mouth 3 (three) times daily.     Analgesics:  Muscle Relaxants - baclofen  Passed - 09/29/2023 12:25 PM      Passed - Cr in normal range and within 180 days    Creatinine, Ser  Date Value Ref Range Status  09/22/2023 0.87 0.57 - 1.00 mg/dL Final         Passed - eGFR is 30 or above and within 180 days    GFR calc Af Amer  Date Value Ref Range Status  12/04/2019 97 >59 mL/min/1.73 Final    Comment:    **[facility] currently reports eGFR in compliance with the current**   recommendations of the [employer]. [facility] will   update reporting as new guidelines are published from the NKF-ASN   Task force.    GFR calc non Af Amer  Date Value Ref Range Status  12/04/2019 84 >59 mL/min/1.73 Final   eGFR  Date Value Ref Range Status  09/22/2023 76 >59 mL/min/1.73 Final         Passed - Valid encounter within last 6 months    Recent Outpatient Visits           1  week ago Moderate episode of recurrent major depressive disorder [facility])   [place] [facility] [place], Megan P, DO   4 months ago Acute non-recurrent frontal sinusitis   [place] [facility] Melvin Pao, NP               buPROPion  (WELLBUTRIN  XL) 150 MG 24 hr tablet 30 tablet 0    Sig: Take 1 tablet (150 mg total) by mouth daily.     Psychiatry: Antidepressants - bupropion  Passed - 09/29/2023 12:25 PM      Passed - Cr in normal range and within 360 days    Creatinine, Ser  Date Value Ref Range Status  09/22/2023 0.87 0.57 - 1.00 mg/dL Final         Passed - AST in normal range and within 360 days    AST  Date Value Ref Range Status  09/22/2023 20 0 - 40 IU/L Final         Passed - ALT in normal range and within 360 days    ALT  Date Value Ref Range Status  09/22/2023 18 0 - 32 IU/L Final         Passed - Completed PHQ-2 or PHQ-9 in the last 360 days      Passed - Last BP in normal range    BP Readings from Last 1 Encounters:  09/22/23 116/78         Passed - Valid encounter within last 6 months    Recent Outpatient Visits           1 week ago Moderate episode of recurrent major depressive disorder [facility])   [place] [facility] [place], Megan P, DO   4 months ago Acute non-recurrent frontal sinusitis   [place] [facility] Mel[facility] Pa[facility], NP               busPIRone  (BUSPAR ) 5 MG tablet 180 tablet 1    Sig: Take 1 tablet (5 mg total) by mouth 2 (two) times daily.     Psychiatry: Anxiolytics/Hypnotics - Non-controlled Passed - 09/29/2023 12:25 PM      Passed - Valid encounter within last 12 months    Recent Outpatient Visits           1 week ago Moderate episode of recurrent major depressive disorder [facility])   [place] [facility] [place], Megan P, DO   4 months ago Acute non-recurrent frontal sinusitis   [place] [facility] Mel[facility] Pao, NP               calcium  carbonate (OS-CAL) 600 MG TABS tablet 30 tablet     Sig: Take by mouth.      Endocrinology: Minerals - Calcium  Supplementation - calcium  carbonate Passed - 09/29/2023 12:25 PM      Passed - Ca in normal range and within 360 days    Calcium   Date Value Ref Range Status  09/22/2023 8.8 8.7 - 10.3 mg/dL Final         Passed - Valid encounter within last 12 months    Recent Outpatient Visits           1 week ago Moderate episode of recurrent major depressive disorder [facility])   [place] [facility] [place], Megan P, DO   4 months ago Acute non-recurrent frontal sinusitis   [place] [facility] Melvin Pao, NP  cetirizine  (ZYRTEC ) 10 MG tablet 90 tablet 1    Sig: Take 1 tablet (10 mg total) by mouth daily.     Ear, Nose, and Throat:  Antihistamines 2 Passed - 09/29/2023 12:25 PM      Passed - Cr in normal range and within 360 days    Creatinine, Ser  Date Value Ref Range Status  09/22/2023 0.87 0.57 - 1.00 mg/dL Final         Passed - Valid encounter within last 12 months    Recent Outpatient Visits           1 week ago Moderate episode of recurrent major depressive disorder [facility])   [place] [facility] [place], Megan P, DO   4 months ago Acute non-recurrent frontal sinusitis   [place] [facility], NP               Cholecalciferol 25 MCG (1000 UT) capsule      Sig: Take 1 capsule (1,000 Units total) by mouth daily. Takes 2 1000 mg per day     Endocrinology:  Vitamins - Vitamin D  Supplementation 2 Failed - 09/29/2023 12:25 PM      Failed - Manual Review: Route requests for 50,000 IU strength to the provider      Passed - Ca in normal range and within 360 days    Calcium   Date Value Ref Range Status  09/22/2023 8.8 8.7 - 10.3 mg/dL Final         Passed - Vitamin D  in normal range and within 360 days    Vit D, 25-Hydroxy  Date Value Ref Range Status  09/22/2023 36.6 30.0 - 100.0 ng/mL Final    Comment:    Vitamin D  deficiency has been  defined by the Institute of Medicine and an Endocrine Society practice guideline as a level of serum 25-OH vitamin D  less than 20 ng/mL (1,2). The Endocrine Society went on to further define vitamin D  insufficiency as a level between 21 and 29 ng/mL (2). 1. IOM (Institute of Medicine). 2010. Dietary reference    intakes for calcium  and D. [place]: The    [employer]. 2. Holick MF, [place], Bischoff-Ferrari HA, et al.    Evaluation, treatment, and prevention of vitamin D     deficiency: an Endocrine Society clinical practice    guideline. JCEM. 2011 Jul; 96(7):1911-30.          Passed - Valid encounter within last 12 months    Recent Outpatient Visits           1 week ago Moderate episode of recurrent major depressive disorder [facility])   [place] [facility] [place], Megan P, DO   4 months ago Acute non-recurrent frontal sinusitis   [place] [facility] Mel[facility] Pao, NP               cholestyramine  (QUESTRAN ) 4 g packet 60 each 3    Sig: Take 1 packet (4 g total) by mouth 3 (three) times daily with meals.     Cardiovascular:  Antilipid - Bile Acid Sequestrants Failed - 09/29/2023 12:25 PM      Failed - Lipid Panel in normal range within the last 12 months    Cholesterol, Total  Date Value Ref Range Status  09/22/2023 152 100 - 199 mg/dL Final   LDL Chol Calc (NIH)  Date Value Ref Range Status  09/22/2023 97 0 - 99 mg/dL Final   HDL  Date Value Ref Range Status  09/22/2023 42 >39 mg/dL Final   Triglycerides  Date Value Ref Range Status  09/22/2023 67 0 - 149 mg/dL Final         Passed - Valid encounter within last 12 months    Recent Outpatient Visits           1 week ago Moderate episode of recurrent major depressive disorder [facility])   [place] [facility] [place], Megan P, DO   4 months ago Acute non-recurrent frontal sinusitis   [place] [facility] Mel[facility] Pao, NP                diclofenac  Sodium (VOLTAREN ) 1 % GEL 100 g     Sig: Apply 4 g topically 4 (four) times daily.     Analgesics:  Topicals Failed - 09/29/2023 12:25 PM      Failed - Manual Review: Labs are only required if the patient has taken medication for more than 8 weeks.      Passed - PLT in normal range and within 360 days    Platelets  Date Value Ref Range Status  09/22/2023 227 150 - 450 x10E3/uL Final         Passed - HGB in normal range and within 360 days    Hemoglobin  Date Value Ref Range Status  09/22/2023 13.4 11.1 - 15.9 g/dL Final         Passed - HCT in normal range and within 360 days    Hematocrit  Date Value Ref Range Status  09/22/2023 41.2 34.0 - 46.6 % Final         Passed - Cr in normal range and within 360 days    Creatinine, Ser  Date Value Ref Range Status  09/22/2023 0.87 0.57 - 1.00 mg/dL Final         Passed - eGFR is 30 or above and within 360 days    GFR calc Af Amer  Date Value Ref Range Status  12/04/2019 97 >59 mL/min/1.73 Final    Comment:    **[facility] currently reports eGFR in compliance with the current**   recommendations of the [employer]. [facility] will   update reporting as new guidelines are published from the NKF-ASN   Task force.    GFR calc non Af Amer  Date Value Ref Range Status  12/04/2019 84 >59 mL/min/1.73 Final   eGFR  Date Value Ref Range Status  09/22/2023 76 >59 mL/min/1.73 Final         Passed - Patient is not pregnant      Passed - Valid encounter within last 12 months    Recent Outpatient Visits           1 week ago Moderate episode of recurrent major depressive disorder [facility])   [place] [facility] [employer], Megan P, DO   4 months ago Acute non-recurrent frontal sinusitis   [place] [facility] Mel[facility] Pao, NP               fluticasone  (FLONASE ) 50 MCG/ACT nasal spray 48 g 2    Sig: USE 2 SPRAYS INTO BOTH NOSTRILS DAILY.     Ear,  Nose, and Throat: Nasal Preparations - Corticosteroids Passed - 09/29/2023 12:25 PM      Passed - Valid encounter within last 12 months    Recent Outpatient Visits           1 week ago Moderate episode  of recurrent major depressive disorder [facility])   [place] [facility] [place], Megan P, DO   4 months ago Acute non-recurrent frontal sinusitis   [place] Mel[facility] Pa[facility], NP               furosemide  (LASIX ) 40 MG tablet 360 tablet 1    Sig: Take 2 tablets (80 mg total) by mouth 2 (two) times daily.     Cardiovascular:  Diuretics - Loop Failed - 09/29/2023 12:25 PM      Failed - Mg Level in normal range and within 180 days    No results found for: MG       Passed - K in normal range and within 180 days    Potassium  Date Value Ref Range Status  09/22/2023 3.9 3.5 - 5.2 mmol/L Final         Passed - Ca in normal range and within 180 days    Calcium   Date Value Ref Range Status  09/22/2023 8.8 8.7 - 10.3 mg/dL Final         Passed - Na in normal range and within 180 days    Sodium  Date Value Ref Range Status  09/22/2023 144 134 - 144 mmol/L Final         Passed - Cr in normal range and within 180 days    Creatinine, Ser  Date Value Ref Range Status  09/22/2023 0.87 0.57 - 1.00 mg/dL Final         Passed - Cl in normal range and within 180 days    Chloride  Date Value Ref Range Status  09/22/2023 106 96 - 106 mmol/L Final         Passed - Last BP in normal range    BP Readings from Last 1 Encounters:  09/22/23 116/78         Passed - Valid encounter within last 6 months    Recent Outpatient Visits           1 week ago Moderate episode of recurrent major depressive disorder (HCC)   [place] [facility] [place], Megan P, DO   4 months ago Acute non-recurrent frontal sinusitis   [place] [facility] Melvin Pao, NP               gabapentin  (NEURONTIN ) 300 MG capsule  360 capsule 1    Sig: Take 2 capsules (600 mg total) by mouth 2 (two) times daily.     Neurology: Anticonvulsants - gabapentin  Passed - 09/29/2023 12:25 PM      Passed - Cr in normal range and within 360 days    Creatinine, Ser  Date Value Ref Range Status  09/22/2023 0.87 0.57 - 1.00 mg/dL Final         Passed - Completed PHQ-2 or PHQ-9 in the last 360 days      Passed - Valid encounter within last 12 months    Recent Outpatient Visits           1 week ago Moderate episode of recurrent major depressive disorder [facility])   [facility] [place], [facility] P, DO   4 months ago Acute non-recurrent frontal sinusitis   [place] [facility] Melvin Pao, NP               hydroxychloroquine  (PLAQUENIL ) 200 MG tablet      Sig: Take 2 tablets (400 mg total) by mouth 2 (  two) times daily.     Not Delegated - Analgesics:  Antirheumatic Agents - abatacept & hydroxychloroquine  Failed - 09/29/2023 12:25 PM      Failed - This refill cannot be delegated      Passed - Valid encounter within last 6 months    Recent Outpatient Visits           1 week ago Moderate episode of recurrent major depressive disorder [facility])   [place] [facility] [place], Megan P, DO   4 months ago Acute non-recurrent frontal sinusitis   [place] [facility] Melvin Pao, NP               hydrOXYzine  (ATARAX ) 25 MG tablet 90 tablet 1    Sig: Take 1 tablet (25 mg total) by mouth at bedtime as needed.     Ear, Nose, and Throat:  Antihistamines 2 Passed - 09/29/2023 12:25 PM      Passed - Cr in normal range and within 360 days    Creatinine, Ser  Date Value Ref Range Status  09/22/2023 0.87 0.57 - 1.00 mg/dL Final         Passed - Valid encounter within last 12 months    Recent Outpatient Visits           1 week ago Moderate episode of recurrent major depressive disorder [facility])   [place] [facility] [place],  Megan P, DO   4 months ago Acute non-recurrent frontal sinusitis   [place] [facility] Mel[facility] Pa[facility], NP               levothyroxine  (SYNTHROID ) 75 MCG tablet 90 tablet 3    Sig: Take 1 tablet (75 mcg total) by mouth daily before breakfast.     Endocrinology:  Hypothyroid Agents Passed - 09/29/2023 12:25 PM      Passed - TSH in normal range and within 360 days    TSH  Date Value Ref Range Status  09/22/2023 1.870 0.450 - 4.500 uIU/mL Final         Passed - Valid encounter within last 12 months    Recent Outpatient Visits           1 week ago Moderate episode of recurrent major depressive disorder [facility])   [place] [facility] [place], Megan P, DO   4 months ago Acute non-recurrent frontal sinusitis   [place] [facility] Mel[facility], NP               montelukast  (SINGULAIR ) 10 MG tablet 90 tablet 1    Sig: Take 1 tablet (10 mg total) by mouth at bedtime.     Pulmonology:  Leukotriene Inhibitors Passed - 09/29/2023 12:25 PM      Passed - Valid encounter within last 12 months    Recent Outpatient Visits           1 week ago Moderate episode of recurrent major depressive disorder [facility])   [place] [facility] [place], Megan P, DO   4 months ago Acute non-recurrent frontal sinusitis   [place] [facility] Melvin Pao, NP               Multiple Vitamin (MULTI-VITAMINS) TABS 30 tablet     Sig: Take by mouth.     There is no refill protocol information for this order     mupirocin cream (BACTROBAN) 2 % 15 g      Off-Protocol  Failed - 09/29/2023 12:25 PM      Failed - Medication not assigned to a protocol, review manually.      Passed - Valid encounter within last 12 months    Recent Outpatient Visits           1 week ago Moderate episode of recurrent major depressive disorder [facility])   [place] [facility] [place], Megan P, DO   4 months ago Acute  non-recurrent frontal sinusitis   [place] [facility] Melvin Pao, NP               nystatin  (MYCOSTATIN ) 100000 UNIT/ML suspension 473 mL     Sig: Take 5 mLs (500,000 Units total) by mouth 4 (four) times daily.     Off-Protocol Failed - 09/29/2023 12:25 PM      Failed - Medication not assigned to a protocol, review manually.      Passed - Valid encounter within last 12 months    Recent Outpatient Visits           1 week ago Moderate episode of recurrent major depressive disorder [facility])   [place] [facility] [place], Megan P, DO   4 months ago Acute non-recurrent frontal sinusitis   [place] [facility] Melvin Pao, NP               pantoprazole  (PROTONIX ) 40 MG tablet 90 tablet 1    Sig: Take 1 tablet (40 mg total) by mouth daily.     Gastroenterology: Proton Pump Inhibitors Passed - 09/29/2023 12:25 PM      Passed - Valid encounter within last 12 months    Recent Outpatient Visits           1 week ago Moderate episode of recurrent major depressive disorder [facility])   [place] [facility] [place], Megan P, DO   4 months ago Acute non-recurrent frontal sinusitis   [place] [facility] Melvin Pao, NP               potassium chloride  SA (KLOR-CON  M) 20 MEQ tablet 90 tablet 1    Sig: Take 1 tablet (20 mEq total) by mouth daily.     Endocrinology:  Minerals - Potassium Supplementation Passed - 09/29/2023 12:25 PM      Passed - K in normal range and within 360 days    Potassium  Date Value Ref Range Status  09/22/2023 3.9 3.5 - 5.2 mmol/L Final         Passed - Cr in normal range and within 360 days    Creatinine, Ser  Date Value Ref Range Status  09/22/2023 0.87 0.57 - 1.00 mg/dL Final         Passed - Valid encounter within last 12 months    Recent Outpatient Visits           1 week ago Moderate episode of recurrent major depressive disorder [facility])   [facility] [place], Megan P, DO   4 months ago Acute non-recurrent frontal sinusitis   [place] [facility] Melvin Pao, NP               triamcinolone  cream (KENALOG ) 0.1 % 30 g     Sig: Apply 1 Application topically 2 (two) times daily.     Not Delegated - Dermatology:  Corticosteroids Failed - 09/29/2023 12:25 PM      Failed - This refill cannot be delegated  Passed - Valid encounter within last 12 months    Recent Outpatient Visits           1 week ago Moderate episode of recurrent major depressive disorder (HCC)   [place] [facility] [facility] [place] P, DO   4 months ago Acute non-recurrent frontal sinusitis   [place] [facility] Melvin Pao, NP               venlafaxine  XR (EFFEXOR -XR) 150 MG 24 hr capsule 90 capsule 1    Sig: TAKE 1 CAPSULE BY MOUTH EVERY DAY WITH BREAKFAST WITH 75MG  FOR 225MG  DAILY     Psychiatry: Antidepressants - SNRI - desvenlafaxine & venlafaxine  Failed - 09/29/2023 12:25 PM      Failed - Lipid Panel in normal range within the last 12 months    Cholesterol, Total  Date Value Ref Range Status  09/22/2023 152 100 - 199 mg/dL Final   LDL Chol Calc (NIH)  Date Value Ref Range Status  09/22/2023 97 0 - 99 mg/dL Final   HDL  Date Value Ref Range Status  09/22/2023 42 >39 mg/dL Final   Triglycerides  Date Value Ref Range Status  09/22/2023 67 0 - 149 mg/dL Final         Passed - Cr in normal range and within 360 days    Creatinine, Ser  Date Value Ref Range Status  09/22/2023 0.87 0.57 - 1.00 mg/dL Final         Passed - Completed PHQ-2 or PHQ-9 in the last 360 days      Passed - Last BP in normal range    BP Readings from Last 1 Encounters:  09/22/23 116/78         Passed - Valid encounter within last 6 months    Recent Outpatient Visits           1 week ago Moderate episode of recurrent major depressive disorder (HCC)   [place]  [facility] [place], Megan P, DO   4 months ago Acute non-recurrent frontal sinusitis   [place] [facility] Melvin Pao, NP               venlafaxine  XR (EFFEXOR -XR) 75 MG 24 hr capsule 90 capsule 1    Sig: Take 1 capsule (75 mg total) by mouth daily. WITH 150MG  FOR 225MG  DAILY     Psychiatry: Antidepressants - SNRI - desvenlafaxine & venlafaxine  Failed - 09/29/2023 12:25 PM      Failed - Lipid Panel in normal range within the last 12 months    Cholesterol, Total  Date Value Ref Range Status  09/22/2023 152 100 - 199 mg/dL Final   LDL Chol Calc (NIH)  Date Value Ref Range Status  09/22/2023 97 0 - 99 mg/dL Final   HDL  Date Value Ref Range Status  09/22/2023 42 >39 mg/dL Final   Triglycerides  Date Value Ref Range Status  09/22/2023 67 0 - 149 mg/dL Final         Passed - Cr in normal range and within 360 days    Creatinine, Ser  Date Value Ref Range Status  09/22/2023 0.87 0.57 - 1.00 mg/dL Final         Passed - Completed PHQ-2 or PHQ-9 in the last 360 days      Passed - Last BP in normal range    BP Readings from Last 1 Encounters:  09/22/23 116/78  Passed - Valid encounter within last 6 months    Recent Outpatient Visits           1 week ago Moderate episode of recurrent major depressive disorder [facility])   [place] [facility] [place], Megan P, DO   4 months ago Acute non-recurrent frontal sinusitis   [place] [facility] Melvin Pao, NP

## 2023-09-29 NOTE — Telephone Encounter (Signed)
 Requested medication (s) are due for refill today: yes  Requested medication (s) are on the active medication list: Only the diclofenac , Nystatin , triamcinolone  cream are AML  Last refill:  (Hx Meds: Acetaminophen : 04/19/16  Azathioprine 05/18/2016 Calcium : 04/19/16 Cholecalciferol: 04/19/16 Hydroxychloroquine : 04/19/16 Multiple vitamins: 04/19/16 Nystatin  powder: 02/10/23 (off protocol) Triamcinolone : 10/08/21 (not delegated) Diclofenac : 02/10/21 100 g 3 RF (last prescribed in 22) Future visit scheduled: yes  Notes to clinic:  wanting meds sent to [facility]   Requested Prescriptions  Pending Prescriptions Disp Refills   acetaminophen  (TYLENOL ) 325 MG tablet      Sig: Take by mouth as needed.     Over the counter: OTC - acetaminophen  Passed - 09/29/2023 12:27 PM      Passed - Cr in normal range and within 360 days    Creatinine, Ser  Date Value Ref Range Status  09/22/2023 0.87 0.57 - 1.00 mg/dL Final         Passed - ALT in normal range and within 360 days    ALT  Date Value Ref Range Status  09/22/2023 18 0 - 32 IU/L Final         Passed - AST in normal range and within 360 days    AST  Date Value Ref Range Status  09/22/2023 20 0 - 40 IU/L Final         Passed - Valid encounter within last 12 months    Recent Outpatient Visits           1 week ago Moderate episode of recurrent major depressive disorder [facility])   [place] [facility] [place], Megan P, DO   4 months ago Acute non-recurrent frontal sinusitis   [place] [facility] Melvin Pao, NP               azaTHIOprine (IMURAN) 50 MG tablet      Sig: Take 2 tablets (100 mg total) by mouth daily.     Not Delegated - Immunology: Immunosuppressive Agents - azathioprine Failed - 09/29/2023 12:27 PM      Failed - This refill cannot be delegated      Passed - ALT in normal range and within 90 days    ALT  Date Value Ref Range Status  09/22/2023 18 0 - 32 IU/L Final          Passed - AST in normal range and within 90 days    AST  Date Value Ref Range Status  09/22/2023 20 0 - 40 IU/L Final         Passed - HCT in normal range and within 30 days    Hematocrit  Date Value Ref Range Status  09/22/2023 41.2 34.0 - 46.6 % Final         Passed - HGB in normal range and within 30 days    Hemoglobin  Date Value Ref Range Status  09/22/2023 13.4 11.1 - 15.9 g/dL Final         Passed - PLT in normal range and within 30 days    Platelets  Date Value Ref Range Status  09/22/2023 227 150 - 450 x10E3/uL Final         Passed - WBC in normal range and within 30 days    WBC  Date Value Ref Range Status  09/22/2023 4.7 3.4 - 10.8 x10E3/uL Final         Passed - Cr in normal range and within 90 days  Creatinine, Ser  Date Value Ref Range Status  09/22/2023 0.87 0.57 - 1.00 mg/dL Final         Passed - RBC in normal range and within 30 days    RBC  Date Value Ref Range Status  09/22/2023 4.08 3.77 - 5.28 x10E6/uL Final         Passed - Valid encounter within last 6 months    Recent Outpatient Visits           1 week ago Moderate episode of recurrent major depressive disorder [facility])   [place] [facility] [place], Megan P, DO   4 months ago Acute non-recurrent frontal sinusitis   [place] [facility] Melvin Pao, NP               calcium  carbonate (OS-CAL) 600 MG TABS tablet 30 tablet     Sig: Take by mouth.     Endocrinology: Minerals - Calcium  Supplementation - calcium  carbonate Passed - 09/29/2023 12:27 PM      Passed - Ca in normal range and within 360 days    Calcium   Date Value Ref Range Status  09/22/2023 8.8 8.7 - 10.3 mg/dL Final         Passed - Valid encounter within last 12 months    Recent Outpatient Visits           1 week ago Moderate episode of recurrent major depressive disorder [facility])   [place] [facility] [place], Megan P, DO   4 months ago Acute non-recurrent  frontal sinusitis   [place] [facility] Melvin Pao, NP               Cholecalciferol 25 MCG (1000 UT) capsule      Sig: Take 1 capsule (1,000 Units total) by mouth daily. Takes 2 1000 mg per day     Endocrinology:  Vitamins - Vitamin D  Supplementation 2 Failed - 09/29/2023 12:27 PM      Failed - Manual Review: Route requests for 50,000 IU strength to the provider      Passed - Ca in normal range and within 360 days    Calcium   Date Value Ref Range Status  09/22/2023 8.8 8.7 - 10.3 mg/dL Final         Passed - Vitamin D  in normal range and within 360 days    Vit D, 25-Hydroxy  Date Value Ref Range Status  09/22/2023 36.6 30.0 - 100.0 ng/mL Final    Comment:    Vitamin D  deficiency has been defined by the Institute of Medicine and an Endocrine Society practice guideline as a level of serum 25-OH vitamin D  less than 20 ng/mL (1,2). The Endocrine Society went on to further define vitamin D  insufficiency as a level between 21 and 29 ng/mL (2). 1. IOM (Institute of Medicine). 2010. Dietary reference    intakes for calcium  and D. [place]: The    [employer]. 2. Holick MF, Binkley [place], Bischoff-Ferrari HA, et al.    Evaluation, treatment, and prevention of vitamin D     deficiency: an Endocrine Society clinical practice    guideline. JCEM. 2011 Jul; 96(7):1911-30.          Passed - Valid encounter within last 12 months    Recent Outpatient Visits           1 week ago Moderate episode of recurrent major depressive disorder [facility])   [place] [facility] [place], [place],  DO   4 months ago Acute non-recurrent frontal sinusitis   [place] [facility] Melvin Pao, NP               diclofenac  Sodium (VOLTAREN ) 1 % GEL 100 g     Sig: Apply 4 g topically 4 (four) times daily.     Analgesics:  Topicals Failed - 09/29/2023 12:27 PM      Failed - Manual Review: Labs are only required if the patient  has taken medication for more than 8 weeks.      Passed - PLT in normal range and within 360 days    Platelets  Date Value Ref Range Status  09/22/2023 227 150 - 450 x10E3/uL Final         Passed - HGB in normal range and within 360 days    Hemoglobin  Date Value Ref Range Status  09/22/2023 13.4 11.1 - 15.9 g/dL Final         Passed - HCT in normal range and within 360 days    Hematocrit  Date Value Ref Range Status  09/22/2023 41.2 34.0 - 46.6 % Final         Passed - Cr in normal range and within 360 days    Creatinine, Ser  Date Value Ref Range Status  09/22/2023 0.87 0.57 - 1.00 mg/dL Final         Passed - eGFR is 30 or above and within 360 days    GFR calc Af Amer  Date Value Ref Range Status  12/04/2019 97 >59 mL/min/1.73 Final    Comment:    **[facility] currently reports eGFR in compliance with the current**   recommendations of the [employer]. [facility] will   update reporting as new guidelines are published from the NKF-ASN   Task force.    GFR calc non Af Amer  Date Value Ref Range Status  12/04/2019 84 >59 mL/min/1.73 Final   eGFR  Date Value Ref Range Status  09/22/2023 76 >59 mL/min/1.73 Final         Passed - Patient is not pregnant      Passed - Valid encounter within last 12 months    Recent Outpatient Visits           1 week ago Moderate episode of recurrent major depressive disorder [facility])   [place] [facility] [place], Megan P, DO   4 months ago Acute non-recurrent frontal sinusitis   [place] [facility] Mel[facility] Pao, NP               hydroxychloroquine  (PLAQUENIL ) 200 MG tablet      Sig: Take 2 tablets (400 mg total) by mouth 2 (two) times daily.     Not Delegated - Analgesics:  Antirheumatic Agents - abatacept & hydroxychloroquine  Failed - 09/29/2023 12:27 PM      Failed - This refill cannot be delegated      Passed - Valid encounter within last 6 months    Recent Outpatient  Visits           1 week ago Moderate episode of recurrent major depressive disorder [facility])    [facility], DO   4 months ago Acute non-recurrent frontal sinusitis    [facility], NP               Multiple Vitamin (MULTI-VITAMINS) TABS 30 tablet     Sig: Take by  mouth.     There is no refill protocol information for this order     mupirocin cream (BACTROBAN) 2 % 15 g      Off-Protocol Failed - 09/29/2023 12:27 PM      Failed - Medication not assigned to a protocol, review manually.      Passed - Valid encounter within last 12 months    Recent Outpatient Visits           1 week ago Moderate episode of recurrent major depressive disorder [facility])   [place] [facility], [facility] P, DO   4 months ago Acute non-recurrent frontal sinusitis   [place] [facility] Pa[facility], NP               nystatin  (MYCOSTATIN ) 100000 UNIT/ML suspension 473 mL     Sig: Take 5 mLs (500,000 Units total) by mouth 4 (four) times daily.     Off-Protocol Failed - 09/29/2023 12:27 PM      Failed - Medication not assigned to a protocol, review manually.      Passed - Valid encounter within last 12 months    Recent Outpatient Visits           1 week ago Moderate episode of recurrent major depressive disorder [facility])   [place] [facility] [place], Megan P, DO   4 months ago Acute non-recurrent frontal sinusitis   [place] [facility] Pao, NP               triamcinolone  cream (KENALOG ) 0.1 % 30 g     Sig: Apply 1 Application topically 2 (two) times daily.     Not Delegated - Dermatology:  Corticosteroids Failed - 09/29/2023 12:27 PM      Failed - This refill cannot be delegated      Passed - Valid encounter within last 12 months    Recent Outpatient Visits           1 week ago Moderate episode of recurrent major  depressive disorder [facility])   [place] [facility] [place], Megan P, DO   4 months ago Acute non-recurrent frontal sinusitis   [place] [facility], NP              Signed Prescriptions Disp Refills   albuterol  (VENTOLIN  HFA) 108 (90 Base) MCG/ACT inhaler 18 each 2    Sig: Inhale 1-2 puffs into the lungs every 4 (four) hours as needed for wheezing or shortness of breath.     Pulmonology:  Beta Agonists 2 Passed - 09/29/2023 12:27 PM      Passed - Last BP in normal range    BP Readings from Last 1 Encounters:  09/22/23 116/78         Passed - Last Heart Rate in normal range    Pulse Readings from Last 1 Encounters:  09/22/23 72         Passed - Valid encounter within last 12 months    Recent Outpatient Visits           1 week ago Moderate episode of recurrent major depressive disorder [facility])   [place] [facility] [place], Megan P, DO   4 months ago Acute non-recurrent frontal sinusitis   [place] [facility] Mel[facility] Pao, NP               amLODipine  (NORVASC ) 10 MG tablet 90 tablet 1  Sig: Take 1 tablet (10 mg total) by mouth daily.     Cardiovascular: Calcium  Channel Blockers 2 Passed - 09/29/2023 12:27 PM      Passed - Last BP in normal range    BP Readings from Last 1 Encounters:  09/22/23 116/78         Passed - Last Heart Rate in normal range    Pulse Readings from Last 1 Encounters:  09/22/23 72         Passed - Valid encounter within last 6 months    Recent Outpatient Visits           1 week ago Moderate episode of recurrent major depressive disorder [facility])   [place] [facility] [place], Megan P, DO   4 months ago Acute non-recurrent frontal sinusitis   [place] [facility] Mel[facility] Pao, NP               apixaban  (ELIQUIS ) 5 MG TABS tablet 180 tablet 1    Sig: Take 1 tablet (5 mg total) by mouth 2 (two) times daily.      Hematology:  Anticoagulants - apixaban  Passed - 09/29/2023 12:27 PM      Passed - PLT in normal range and within 360 days    Platelets  Date Value Ref Range Status  09/22/2023 227 150 - 450 x10E3/uL Final         Passed - HGB in normal range and within 360 days    Hemoglobin  Date Value Ref Range Status  09/22/2023 13.4 11.1 - 15.9 g/dL Final         Passed - HCT in normal range and within 360 days    Hematocrit  Date Value Ref Range Status  09/22/2023 41.2 34.0 - 46.6 % Final         Passed - Cr in normal range and within 360 days    Creatinine, Ser  Date Value Ref Range Status  09/22/2023 0.87 0.57 - 1.00 mg/dL Final         Passed - AST in normal range and within 360 days    AST  Date Value Ref Range Status  09/22/2023 20 0 - 40 IU/L Final         Passed - ALT in normal range and within 360 days    ALT  Date Value Ref Range Status  09/22/2023 18 0 - 32 IU/L Final         Passed - Valid encounter within last 12 months    Recent Outpatient Visits           1 week ago Moderate episode of recurrent major depressive disorder (HCC)   [place] [facility] [place], Megan P, DO   4 months ago Acute non-recurrent frontal sinusitis   [place] [facility], NP               atorvastatin  (LIPITOR) 40 MG tablet 90 tablet 1    Sig: Take 1 tablet (40 mg total) by mouth daily.     Cardiovascular:  Antilipid - Statins Failed - 09/29/2023 12:27 PM      Failed - Lipid Panel in normal range within the last 12 months    Cholesterol, Total  Date Value Ref Range Status  09/22/2023 152 100 - 199 mg/dL Final   LDL Chol Calc (NIH)  Date Value Ref Range Status  09/22/2023 97 0 - 99 mg/dL Final   HDL  Date  Value Ref Range Status  09/22/2023 42 >39 mg/dL Final   Triglycerides  Date Value Ref Range Status  09/22/2023 67 0 - 149 mg/dL Final         Passed - Patient is not pregnant      Passed - Valid encounter within  last 12 months    Recent Outpatient Visits           1 week ago Moderate episode of recurrent major depressive disorder [facility])   [place] [facility] [place], Megan P, DO   4 months ago Acute non-recurrent frontal sinusitis   [place] [facility] Melvin Pao, NP               baclofen  (LIORESAL ) 10 MG tablet 270 tablet 1    Sig: Take 1 tablet (10 mg total) by mouth 3 (three) times daily.     Analgesics:  Muscle Relaxants - baclofen  Passed - 09/29/2023 12:27 PM      Passed - Cr in normal range and within 180 days    Creatinine, Ser  Date Value Ref Range Status  09/22/2023 0.87 0.57 - 1.00 mg/dL Final         Passed - eGFR is 30 or above and within 180 days    GFR calc Af Amer  Date Value Ref Range Status  12/04/2019 97 >59 mL/min/1.73 Final    Comment:    **[facility] currently reports eGFR in compliance with the current**   recommendations of the [employer]. [facility] will   update reporting as new guidelines are published from the NKF-ASN   Task force.    GFR calc non Af Amer  Date Value Ref Range Status  12/04/2019 84 >59 mL/min/1.73 Final   eGFR  Date Value Ref Range Status  09/22/2023 76 >59 mL/min/1.73 Final         Passed - Valid encounter within last 6 months    Recent Outpatient Visits           1 week ago Moderate episode of recurrent major depressive disorder (HCC)   [facility] [place], Megan P, DO   4 months ago Acute non-recurrent frontal sinusitis   [place] [facility] Mel[facility] Pao, NP               buPROPion  (WELLBUTRIN  XL) 150 MG 24 hr tablet 30 tablet 0    Sig: Take 1 tablet (150 mg total) by mouth daily.     Psychiatry: Antidepressants - bupropion  Passed - 09/29/2023 12:27 PM      Passed - Cr in normal range and within 360 days    Creatinine, Ser  Date Value Ref Range Status  09/22/2023 0.87 0.57 - 1.00 mg/dL Final         Passed - AST  in normal range and within 360 days    AST  Date Value Ref Range Status  09/22/2023 20 0 - 40 IU/L Final         Passed - ALT in normal range and within 360 days    ALT  Date Value Ref Range Status  09/22/2023 18 0 - 32 IU/L Final         Passed - Completed PHQ-2 or PHQ-9 in the last 360 days      Passed - Last BP in normal range    BP Readings from Last 1 Encounters:  09/22/23 116/78         Passed - Valid encounter  within last 6 months    Recent Outpatient Visits           1 week ago Moderate episode of recurrent major depressive disorder [facility])   [place] [facility] [place], Megan P, DO   4 months ago Acute non-recurrent frontal sinusitis   [place] [facility] Melvin Pao, NP               busPIRone  (BUSPAR ) 5 MG tablet 180 tablet 1    Sig: Take 1 tablet (5 mg total) by mouth 2 (two) times daily.     Psychiatry: Anxiolytics/Hypnotics - Non-controlled Passed - 09/29/2023 12:27 PM      Passed - Valid encounter within last 12 months    Recent Outpatient Visits           1 week ago Moderate episode of recurrent major depressive disorder [facility])   [place] [facility] [place], Megan P, DO   4 months ago Acute non-recurrent frontal sinusitis   [place] [facility] Melvin Pao, NP               cetirizine  (ZYRTEC ) 10 MG tablet 90 tablet 1    Sig: Take 1 tablet (10 mg total) by mouth daily.     Ear, Nose, and Throat:  Antihistamines 2 Passed - 09/29/2023 12:27 PM      Passed - Cr in normal range and within 360 days    Creatinine, Ser  Date Value Ref Range Status  09/22/2023 0.87 0.57 - 1.00 mg/dL Final         Passed - Valid encounter within last 12 months    Recent Outpatient Visits           1 week ago Moderate episode of recurrent major depressive disorder [facility])   [place] [facility] [place], Megan P, DO   4 months ago Acute non-recurrent frontal sinusitis    [place] [facility] Mel[facility] Pa[facility], NP               cholestyramine  (QUESTRAN ) 4 g packet 60 each 3    Sig: Take 1 packet (4 g total) by mouth 3 (three) times daily with meals.     Cardiovascular:  Antilipid - Bile Acid Sequestrants Failed - 09/29/2023 12:27 PM      Failed - Lipid Panel in normal range within the last 12 months    Cholesterol, Total  Date Value Ref Range Status  09/22/2023 152 100 - 199 mg/dL Final   LDL Chol Calc (NIH)  Date Value Ref Range Status  09/22/2023 97 0 - 99 mg/dL Final   HDL  Date Value Ref Range Status  09/22/2023 42 >39 mg/dL Final   Triglycerides  Date Value Ref Range Status  09/22/2023 67 0 - 149 mg/dL Final         Passed - Valid encounter within last 12 months    Recent Outpatient Visits           1 week ago Moderate episode of recurrent major depressive disorder [facility])   [place] [facility] [place], Megan P, DO   4 months ago Acute non-recurrent frontal sinusitis    [facility] Mel[facility] Pao, NP               fluticasone  (FLONASE ) 50 MCG/ACT nasal spray 48 g 2    Sig: USE 2 SPRAYS INTO BOTH NOSTRILS DAILY.     Ear, Nose, and Throat:  Nasal Preparations - Corticosteroids Passed - 09/29/2023 12:27 PM      Passed - Valid encounter within last 12 months    Recent Outpatient Visits           1 week ago Moderate episode of recurrent major depressive disorder [facility])   [facility] [place], Megan P, DO   4 months ago Acute non-recurrent frontal sinusitis   [place] [facility] Mel[facility] Pa[facility], NP               furosemide  (LASIX ) 40 MG tablet 360 tablet 1    Sig: Take 2 tablets (80 mg total) by mouth 2 (two) times daily.     Cardiovascular:  Diuretics - Loop Failed - 09/29/2023 12:27 PM      Failed - Mg Level in normal range and within 180 days    No results found for: MG       Passed - K in normal range and  within 180 days    Potassium  Date Value Ref Range Status  09/22/2023 3.9 3.5 - 5.2 mmol/L Final         Passed - Ca in normal range and within 180 days    Calcium   Date Value Ref Range Status  09/22/2023 8.8 8.7 - 10.3 mg/dL Final         Passed - Na in normal range and within 180 days    Sodium  Date Value Ref Range Status  09/22/2023 144 134 - 144 mmol/L Final         Passed - Cr in normal range and within 180 days    Creatinine, Ser  Date Value Ref Range Status  09/22/2023 0.87 0.57 - 1.00 mg/dL Final         Passed - Cl in normal range and within 180 days    Chloride  Date Value Ref Range Status  09/22/2023 106 96 - 106 mmol/L Final         Passed - Last BP in normal range    BP Readings from Last 1 Encounters:  09/22/23 116/78         Passed - Valid encounter within last 6 months    Recent Outpatient Visits           1 week ago Moderate episode of recurrent major depressive disorder (HCC)   [facility] [place] P, DO   4 months ago Acute non-recurrent frontal sinusitis   [place] [facility] Melvin Pao, NP               gabapentin  (NEURONTIN ) 300 MG capsule 360 capsule 1    Sig: Take 2 capsules (600 mg total) by mouth 2 (two) times daily.     Neurology: Anticonvulsants - gabapentin  Passed - 09/29/2023 12:27 PM      Passed - Cr in normal range and within 360 days    Creatinine, Ser  Date Value Ref Range Status  09/22/2023 0.87 0.57 - 1.00 mg/dL Final         Passed - Completed PHQ-2 or PHQ-9 in the last 360 days      Passed - Valid encounter within last 12 months    Recent Outpatient Visits           1 week ago Moderate episode of recurrent major depressive disorder [facility])   [place] [facility] [place], Megan P, DO   4 months ago Acute non-recurrent frontal sinusitis  [place] [facility] Melvin Pao, NP               hydrOXYzine  (ATARAX ) 25 MG  tablet 90 tablet 1    Sig: Take 1 tablet (25 mg total) by mouth at bedtime as needed.     Ear, Nose, and Throat:  Antihistamines 2 Passed - 09/29/2023 12:27 PM      Passed - Cr in normal range and within 360 days    Creatinine, Ser  Date Value Ref Range Status  09/22/2023 0.87 0.57 - 1.00 mg/dL Final         Passed - Valid encounter within last 12 months    Recent Outpatient Visits           1 week ago Moderate episode of recurrent major depressive disorder [facility])   [place] [facility] [place], Megan P, DO   4 months ago Acute non-recurrent frontal sinusitis   [place] [facility] Melvin Pao, NP               levothyroxine  (SYNTHROID ) 75 MCG tablet 90 tablet 3    Sig: Take 1 tablet (75 mcg total) by mouth daily before breakfast.     Endocrinology:  Hypothyroid Agents Passed - 09/29/2023 12:27 PM      Passed - TSH in normal range and within 360 days    TSH  Date Value Ref Range Status  09/22/2023 1.870 0.450 - 4.500 uIU/mL Final         Passed - Valid encounter within last 12 months    Recent Outpatient Visits           1 week ago Moderate episode of recurrent major depressive disorder [facility])   [place] [facility] [place], Megan P, DO   4 months ago Acute non-recurrent frontal sinusitis   [place] [facility] Melvin Pao, NP               montelukast  (SINGULAIR ) 10 MG tablet 90 tablet 1    Sig: Take 1 tablet (10 mg total) by mouth at bedtime.     Pulmonology:  Leukotriene Inhibitors Passed - 09/29/2023 12:27 PM      Passed - Valid encounter within last 12 months    Recent Outpatient Visits           1 week ago Moderate episode of recurrent major depressive disorder [facility])   [place] [facility] [place], Megan P, DO   4 months ago Acute non-recurrent frontal sinusitis   [place] [facility] Melvin Pao, NP               pantoprazole   (PROTONIX ) 40 MG tablet 90 tablet 1    Sig: Take 1 tablet (40 mg total) by mouth daily.     Gastroenterology: Proton Pump Inhibitors Passed - 09/29/2023 12:27 PM      Passed - Valid encounter within last 12 months    Recent Outpatient Visits           1 week ago Moderate episode of recurrent major depressive disorder [facility])   [place] [facility] [place], Megan P, DO   4 months ago Acute non-recurrent frontal sinusitis    [facility] Mel[facility] Pa[facility], NP               potassium chloride  SA (KLOR-CON  M) 20 MEQ tablet 90 tablet 1    Sig: Take 1 tablet (20 mEq total) by  mouth daily.     Endocrinology:  Minerals - Potassium Supplementation Passed - 09/29/2023 12:27 PM      Passed - K in normal range and within 360 days    Potassium  Date Value Ref Range Status  09/22/2023 3.9 3.5 - 5.2 mmol/L Final         Passed - Cr in normal range and within 360 days    Creatinine, Ser  Date Value Ref Range Status  09/22/2023 0.87 0.57 - 1.00 mg/dL Final         Passed - Valid encounter within last 12 months    Recent Outpatient Visits           1 week ago Moderate episode of recurrent major depressive disorder (HCC)   [place] [facility] [place], Megan P, DO   4 months ago Acute non-recurrent frontal sinusitis   [place] [facility] Melvin Pao, NP               venlafaxine  XR (EFFEXOR -XR) 150 MG 24 hr capsule 90 capsule 1    Sig: TAKE 1 CAPSULE BY MOUTH EVERY DAY WITH BREAKFAST WITH 75MG  FOR 225MG  DAILY     Psychiatry: Antidepressants - SNRI - desvenlafaxine & venlafaxine  Failed - 09/29/2023 12:27 PM      Failed - Lipid Panel in normal range within the last 12 months    Cholesterol, Total  Date Value Ref Range Status  09/22/2023 152 100 - 199 mg/dL Final   LDL Chol Calc (NIH)  Date Value Ref Range Status  09/22/2023 97 0 - 99 mg/dL Final   HDL  Date Value Ref Range Status  09/22/2023 42 >39  mg/dL Final   Triglycerides  Date Value Ref Range Status  09/22/2023 67 0 - 149 mg/dL Final         Passed - Cr in normal range and within 360 days    Creatinine, Ser  Date Value Ref Range Status  09/22/2023 0.87 0.57 - 1.00 mg/dL Final         Passed - Completed PHQ-2 or PHQ-9 in the last 360 days      Passed - Last BP in normal range    BP Readings from Last 1 Encounters:  09/22/23 116/78         Passed - Valid encounter within last 6 months    Recent Outpatient Visits           1 week ago Moderate episode of recurrent major depressive disorder (HCC)   [place] [facility] [place], Megan P, DO   4 months ago Acute non-recurrent frontal sinusitis   [place] [facility] Melvin Pao, NP               venlafaxine  XR (EFFEXOR -XR) 75 MG 24 hr capsule 90 capsule 1    Sig: Take 1 capsule (75 mg total) by mouth daily. WITH 150MG  FOR 225MG  DAILY     Psychiatry: Antidepressants - SNRI - desvenlafaxine & venlafaxine  Failed - 09/29/2023 12:27 PM      Failed - Lipid Panel in normal range within the last 12 months    Cholesterol, Total  Date Value Ref Range Status  09/22/2023 152 100 - 199 mg/dL Final   LDL Chol Calc (NIH)  Date Value Ref Range Status  09/22/2023 97 0 - 99 mg/dL Final   HDL  Date Value Ref Range Status  09/22/2023 42 >39 mg/dL Final   Triglycerides  Date Value Ref  Range Status  09/22/2023 67 0 - 149 mg/dL Final         Passed - Cr in normal range and within 360 days    Creatinine, Ser  Date Value Ref Range Status  09/22/2023 0.87 0.57 - 1.00 mg/dL Final         Passed - Completed PHQ-2 or PHQ-9 in the last 360 days      Passed - Last BP in normal range    BP Readings from Last 1 Encounters:  09/22/23 116/78         Passed - Valid encounter within last 6 months    Recent Outpatient Visits           1 week ago Moderate episode of recurrent major depressive disorder [facility])   [place] [facility], Megan P, DO   4 months ago Acute non-recurrent frontal sinusitis   [place] [facility] Melvin Pao, NP

## 2023-09-30 MED ORDER — CALCIUM CARBONATE 600 MG PO TABS: 600.0000 mg | ORAL_TABLET | Freq: Every day | ORAL | 0 refills | Status: AC

## 2023-09-30 MED ORDER — ACETAMINOPHEN 325 MG PO TABS: 650.0000 mg | ORAL_TABLET | Freq: Four times a day (QID) | ORAL | 0 refills | Status: AC | PRN

## 2023-09-30 MED ORDER — DICLOFENAC SODIUM 1 % EX GEL: 4.0000 g | Freq: Four times a day (QID) | CUTANEOUS | 1 refills | Status: AC

## 2023-09-30 MED ORDER — MULTI-VITAMINS PO TABS: ORAL_TABLET | ORAL | 0 refills | Status: AC

## 2023-09-30 MED ORDER — CHOLECALCIFEROL 25 MCG (1000 UT) PO CAPS: 1000.0000 [IU] | ORAL_CAPSULE | Freq: Every day | ORAL | 0 refills | Status: AC

## 2023-10-02 ENCOUNTER — Other Ambulatory Visit: Payer: Self-pay | Admitting: Family Medicine

## 2023-10-03 ENCOUNTER — Ambulatory Visit: Payer: Self-pay

## 2023-10-03 ENCOUNTER — Telehealth: Payer: Self-pay | Admitting: Family Medicine

## 2023-10-03 DIAGNOSIS — J849 Interstitial pulmonary disease, unspecified: Secondary | ICD-10-CM | POA: Diagnosis not present

## 2023-10-03 DIAGNOSIS — Z13820 Encounter for screening for osteoporosis: Secondary | ICD-10-CM | POA: Diagnosis not present

## 2023-10-03 DIAGNOSIS — Z79899 Other long term (current) drug therapy: Secondary | ICD-10-CM | POA: Diagnosis not present

## 2023-10-03 DIAGNOSIS — M329 Systemic lupus erythematosus, unspecified: Secondary | ICD-10-CM | POA: Diagnosis not present

## 2023-10-03 DIAGNOSIS — M810 Age-related osteoporosis without current pathological fracture: Secondary | ICD-10-CM | POA: Diagnosis not present

## 2023-10-03 DIAGNOSIS — M3213 Lung involvement in systemic lupus erythematosus: Secondary | ICD-10-CM | POA: Diagnosis not present

## 2023-10-03 DIAGNOSIS — E559 Vitamin D deficiency, unspecified: Secondary | ICD-10-CM | POA: Diagnosis not present

## 2023-10-03 NOTE — Telephone Encounter (Signed)
 Requested Prescriptions  Refused Prescriptions Disp Refills   buPROPion  (WELLBUTRIN  XL) 150 MG 24 hr tablet [Pharmacy Med Name: BUPROPION  HCL XL 150 MG TABLET] 30 tablet 0    Sig: TAKE 1 TABLET BY MOUTH EVERY DAY     Psychiatry: Antidepressants - bupropion  Passed - 10/03/2023  5:23 PM      Passed - Cr in normal range and within 360 days    Creatinine, Ser  Date Value Ref Range Status  09/22/2023 0.87 0.57 - 1.00 mg/dL Final         Passed - AST in normal range and within 360 days    AST  Date Value Ref Range Status  09/22/2023 20 0 - 40 IU/L Final         Passed - ALT in normal range and within 360 days    ALT  Date Value Ref Range Status  09/22/2023 18 0 - 32 IU/L Final         Passed - Completed PHQ-2 or PHQ-9 in the last 360 days      Passed - Last BP in normal range    BP Readings from Last 1 Encounters:  09/22/23 116/78         Passed - Valid encounter within last 6 months    Recent Outpatient Visits           1 week ago Moderate episode of recurrent major depressive disorder [facility])   [place] [facility] [place], Megan P, DO   4 months ago Acute non-recurrent frontal sinusitis   [place] [facility] Melvin Pao, NP

## 2023-10-03 NOTE — Telephone Encounter (Signed)
 FYI Only or Action Required?: FYI only for provider.  Patient was last seen in primary care on 09/22/2023 by Kaylee Duwaine SQUIBB, DO.  Called Nurse Triage reporting Facial Pain, Medication Refill, Nasal Congestion, and dry cough.  Symptoms began a week ago.  Interventions attempted: OTC medications: tylenol  and Rest, hydration, or home remedies.  Symptoms are: gradually worsening.  Triage Disposition: See HCP Within 4 Hours (Or PCP Triage)  Patient/caregiver understands and will follow disposition?: No, refuses disposition      Copied from CRM [id number]. Topic: Clinical - Medication Refill >> Oct 03, 2023  1:11 PM Corin V wrote: Medication: doxycycline  (VIBRA -TABS) 100 MG tablet- if patient is no longer taking please call [phone] to update pharmacy to take off patient medication list   Has the patient contacted their pharmacy? Yes (Agent: If no, request that the patient contact the pharmacy for the refill. If patient does not wish to contact the pharmacy document the reason why and proceed with request.) (Agent: If yes, when and what did the pharmacy advise?)   This is the patient's preferred pharmacy:  [facility] - [place], [place] - [address] [place] [address] Phone: [phone] Fax: [phone]   Is this the correct pharmacy for this prescription? Yes If no, delete pharmacy and type the correct one.    Has the prescription been filled recently? No   Is the patient out of the medication? Yes   Has the patient been seen for an appointment in the last year OR does the patient have an upcoming appointment? No   Can we respond through MyChart? No   Agent: Please be advised that Rx refills may take up to 3 business days. We ask that you follow-up with your pharmacy.   Reason for Disposition  [1] SEVERE sinus pain (e.g., excruciating) AND [2] not improved 2 hours after pain medicine  Answer Assessment - Initial Assessment  Questions Pt called requesting refill for doxycyline from March with sinus infection. Pt confirms having symptoms of sinus infection now.  1. LOCATION: Where does it hurt?      Forehead and cheeks sinus pressure 2. ONSET: When did the sinus pain start?  (e.g., hours, days)      About a week already 3. SEVERITY: How bad is the pain?   (Scale 0-10; or none, mild, moderate or severe)     8/10, tylenol  was helping, not really helping anymore, moderate 4. RECURRENT SYMPTOM: Have you ever had sinus problems before? If Yes, ask: When was the last time? and What happened that time?      yes 5. NASAL CONGESTION: Is the nose blocked? If Yes, ask: Can you open it or must you breathe through your mouth?     Completely blocked, having to breathe through mouth 6. NASAL DISCHARGE: Do you have discharge from your nose? If so ask, What color?     No discharge coming out 7. FEVER: Do you have a fever? If Yes, ask: What is it, how was it measured, and when did it start?      no 8. OTHER SYMPTOMS: Do you have any other symptoms? (e.g., sore throat, cough, earache, difficulty breathing)     Dry cough, no SOB, no ear pain or sore throat  Can't make an appt today Let me go a couple more days, see if I feel better    Advised pt be examined in next 4 hours for symptoms, pt refusing at this time, requesting to wait a  couple more days to see if feeling better. Advised call back if worsening or new symptoms like fever, SOB, or severe pain. Sending message to PCP for follow up.  Protocols used: Sinus Pain or Congestion-A-AH

## 2023-10-03 NOTE — Telephone Encounter (Signed)
 Copied from CRM [id number]. Topic: Clinical - Medication Refill >> Oct 03, 2023  1:11 PM Corin V wrote: Medication: doxycycline  (VIBRA -TABS) 100 MG tablet- if patient is no longer taking please call [phone] to update pharmacy to take off patient medication list  Has the patient contacted their pharmacy? Yes (Agent: If no, request that the patient contact the pharmacy for the refill. If patient does not wish to contact the pharmacy document the reason why and proceed with request.) (Agent: If yes, when and what did the pharmacy advise?)  This is the patient's preferred pharmacy:  [facility] - [place], [place] - [address] [place] [address] Phone: [phone] Fax: [phone]  Is this the correct pharmacy for this prescription? Yes If no, delete pharmacy and type the correct one.   Has the prescription been filled recently? No  Is the patient out of the medication? Yes  Has the patient been seen for an appointment in the last year OR does the patient have an upcoming appointment? No  Can we respond through MyChart? No  Agent: Please be advised that Rx refills may take up to 3 business days. We ask that you follow-up with your pharmacy.

## 2023-10-03 NOTE — Telephone Encounter (Signed)
 Opened triage encounter for pt requesting doxycycline .

## 2023-10-04 NOTE — Telephone Encounter (Signed)
 Needs appointment for refill of doxy. Not appropriate

## 2023-10-04 NOTE — Telephone Encounter (Signed)
 Ok for E2C2 to review.  Attempted return call to patient to advise that per PCP is unable to prescribe doxycycline  (VIBRA -TABS) 100 MG tablet without an appointment. If patient would like to schedule, please assist.

## 2023-10-14 ENCOUNTER — Other Ambulatory Visit: Payer: Self-pay | Admitting: Family Medicine

## 2023-10-20 ENCOUNTER — Ambulatory Visit: Payer: Self-pay

## 2023-10-20 NOTE — Telephone Encounter (Signed)
 PCP office CAL assisted patient in getting scheduled  FYI Only or Action Required?: FYI only for provider.  Patient was last seen in primary care on 09/22/2023 by Vicci Duwaine SQUIBB, DO.  Called Nurse Triage reporting Facial Pain.  Symptoms began 2-3 weeks ago.  Interventions attempted: OTC medications: sinus mediation and Rest, hydration, or home remedies.  Symptoms are: unchanged.  Triage Disposition: See PCP When Office is Open (Within 3 Days)  Patient/caregiver understands and will follow disposition?: Yes            Copied from CRM #[id number]. Topic: Clinical - Red Word Triage >> Oct 20, 2023  2:30 PM Carla L wrote: Red Word that prompted transfer to Nurse Triage: severe headache, pressure and pain Reason for Disposition  [1] Sinus congestion (pressure, fullness) AND [2] present > 10 days  Answer Assessment - Initial Assessment Questions Patient states she has been having these symptoms for 2-3 weeks now Over the counter sinus medication is not helping She states that she has gone to another office to be seen about a year ago This RN called CAL for her PCP to see about any openings at another office. Patient was advised that Urgent Care was also an option and if anything gets worse the Emergency Room Patient verbalized understanding CAL assisted patient in getting scheduled   1. LOCATION: Where does it hurt?      Sinuses and right side of head/sinuses 2. ONSET: When did the sinus pain start?  (e.g., hours, days)      2-3 weeks 3. SEVERITY: How bad is the pain?   (Scale 0-10; or none, mild, moderate or severe)     6 4. RECURRENT SYMPTOM: Have you ever had sinus problems before? If Yes, ask: When was the last time? and What happened that time?      yes 5. NASAL CONGESTION: Is the nose blocked? If Yes, ask: Can you open it or must you breathe through your mouth?     Yes 6. NASAL DISCHARGE: Do you have discharge from your nose? If so ask, What  color?     No 7. FEVER: Do you have a fever? If Yes, ask: What is it, how was it measured, and when did it start?      No 8. OTHER SYMPTOMS: Do you have any other symptoms? (e.g., sore throat, cough, earache, difficulty breathing)   No  Protocols used: Sinus Pain or Congestion-A-AH

## 2023-10-24 ENCOUNTER — Ambulatory Visit (INDEPENDENT_AMBULATORY_CARE_PROVIDER_SITE_OTHER): Admitting: Family Medicine

## 2023-10-24 ENCOUNTER — Encounter: Payer: Self-pay | Admitting: Family Medicine

## 2023-10-24 VITALS — BP 117/79 | HR 70 | Temp 97.8°F | Ht 62.5 in | Wt 265.2 lb

## 2023-10-24 DIAGNOSIS — J0100 Acute maxillary sinusitis, unspecified: Secondary | ICD-10-CM | POA: Diagnosis not present

## 2023-10-24 DIAGNOSIS — F331 Major depressive disorder, recurrent, moderate: Secondary | ICD-10-CM | POA: Diagnosis not present

## 2023-10-24 DIAGNOSIS — F419 Anxiety disorder, unspecified: Secondary | ICD-10-CM

## 2023-10-24 MED ORDER — DOXYCYCLINE HYCLATE 100 MG PO TABS: 100.0000 mg | ORAL_TABLET | Freq: Two times a day (BID) | ORAL | 0 refills | Status: DC

## 2023-10-24 MED ORDER — BUPROPION HCL ER (XL) 300 MG PO TB24: 300.0000 mg | ORAL_TABLET | Freq: Every day | ORAL | 2 refills | Status: DC

## 2023-10-24 NOTE — Progress Notes (Signed)
 BP 117/79   Pulse 70   Temp 97.8 F (36.6 C) (Oral)   Ht 5' 2.5 (1.588 m)   Wt 265 lb 3.2 oz (120.3 kg)   SpO2 99%   BMI 47.73 kg/m    Subjective:    Patient ID: Kaylee Fox, female    DOB: 1960/07/22, 63 y.o.   MRN: [id number]  HPI: Kaylee Fox is a 63 y.o. female  Chief Complaint  Patient presents with   Sinusitis    Onset about 2 weeks ago. OTC med no relief    Ear Pain    Right    UPPER RESPIRATORY TRACT INFECTION Duration: 2 weeks Worst symptom: nasal congestion Fever: no Cough: no Shortness of breath: no Wheezing: no Chest pain: no Chest tightness: no Chest congestion: no Nasal congestion: yes Runny nose: yes Post nasal drip: yes Sneezing: no Sore throat: no Swollen glands: no Sinus pressure: yes Headache: yes Face pain: yes Toothache: no Ear pain: yes right Ear pressure: yes right Eyes red/itching:yes Eye drainage/crusting: yes  Vomiting: no Rash: no Fatigue: yes Sick contacts: no Strep contacts: no  Context: worse Recurrent sinusitis: no Relief with OTC cold/cough medications: no  Treatments attempted: cold/sinus, mucinex, and anti-histamine   DEPRESSION Mood status: stable Satisfied with current treatment?: no Symptom severity: moderate  Duration of current treatment : chronic Side effects: no Medication compliance: excellent compliance Psychotherapy/counseling: no  Previous psychiatric medications: effexor , wellbutrin  Depressed mood: yes Anxious mood: yes Anhedonia: no Significant weight loss or gain: no Insomnia: no  Fatigue: yes Feelings of worthlessness or guilt: no Impaired concentration/indecisiveness: no Suicidal ideations: no Hopelessness: no Crying spells: no    09/22/2023    9:39 AM 06/01/2023   10:57 AM 03/08/2023   10:21 AM 02/10/2023    9:08 AM 01/04/2023   10:36 AM  Depression screen PHQ 2/9  Decreased Interest 2 2 2  0 2  Down, Depressed, Hopeless 3 0 0 0 2  PHQ - 2 Score 5 2 2  0 4  Altered  sleeping 3 2 1  0 0  Tired, decreased energy 3 2 1  0 1  Change in appetite 3 2 0 0 0  Feeling bad or failure about yourself  3 1 0 0 0  Trouble concentrating 3 1 0 0 2  Moving slowly or fidgety/restless 1 0 1 0 2  Suicidal thoughts 0 0 0 0 0  PHQ-9 Score 21 10 5  0 9  Difficult doing work/chores Not difficult at all  Not difficult at all  Not difficult at all     Relevant past medical, surgical, family and social history reviewed and updated as indicated. Interim medical history since our last visit reviewed. Allergies and medications reviewed and updated.  Review of Systems  Constitutional:  Positive for fatigue. Negative for activity change, appetite change, chills, diaphoresis, fever and unexpected weight change.  HENT:  Positive for congestion, postnasal drip, rhinorrhea, sinus pressure and sinus pain. Negative for dental problem, drooling, ear discharge, ear pain, facial swelling, hearing loss, mouth sores, nosebleeds, sneezing, sore throat, tinnitus, trouble swallowing and voice change.   Respiratory: Negative.    Cardiovascular: Negative.   Gastrointestinal: Negative.   Psychiatric/Behavioral:  Positive for dysphoric mood. Negative for agitation, behavioral problems, confusion, decreased concentration, hallucinations, self-injury, sleep disturbance and suicidal ideas. The patient is nervous/anxious. The patient is not hyperactive.     Per HPI unless specifically indicated above     Objective:    BP 117/79   Pulse 70  Temp 97.8 F (36.6 C) (Oral)   Ht 5' 2.5 (1.588 m)   Wt 265 lb 3.2 oz (120.3 kg)   SpO2 99%   BMI 47.73 kg/m   Wt Readings from Last 3 Encounters:  10/24/23 265 lb 3.2 oz (120.3 kg)  09/22/23 266 lb 12.8 oz (121 kg)  06/01/23 275 lb (124.7 kg)    Physical Exam Vitals and nursing note reviewed.  Constitutional:      General: She is not in acute distress.    Appearance: Normal appearance. She is not ill-appearing, toxic-appearing or diaphoretic.   HENT:     Head: Normocephalic and atraumatic.     Right Ear: Tympanic membrane, ear canal and external ear normal.     Left Ear: Tympanic membrane, ear canal and external ear normal.     Nose: Congestion and rhinorrhea present.     Mouth/Throat:     Mouth: Mucous membranes are moist.     Pharynx: Oropharynx is clear. No oropharyngeal exudate or posterior oropharyngeal erythema.  Eyes:     General: No scleral icterus.       Right eye: No discharge.        Left eye: No discharge.     Extraocular Movements: Extraocular movements intact.     Conjunctiva/sclera: Conjunctivae normal.     Pupils: Pupils are equal, round, and reactive to light.  Cardiovascular:     Rate and Rhythm: Normal rate and regular rhythm.     Pulses: Normal pulses.     Heart sounds: Normal heart sounds. No murmur heard.    No friction rub. No gallop.  Pulmonary:     Effort: Pulmonary effort is normal. No respiratory distress.     Breath sounds: Normal breath sounds. No stridor. No wheezing, rhonchi or rales.  Chest:     Chest wall: No tenderness.  Musculoskeletal:        General: Normal range of motion.     Cervical back: Normal range of motion and neck supple.  Skin:    General: Skin is warm and dry.     Capillary Refill: Capillary refill takes less than 2 seconds.     Coloration: Skin is not jaundiced or pale.     Findings: No bruising, erythema, lesion or rash.  Neurological:     General: No focal deficit present.     Mental Status: She is alert and oriented to person, place, and time. Mental status is at baseline.  Psychiatric:        Mood and Affect: Mood normal.        Behavior: Behavior normal.        Thought Content: Thought content normal.        Judgment: Judgment normal.     Results for orders placed or performed in visit on 09/22/23  CBC with Differential/Platelet   Collection Time: 09/22/23 10:10 AM  Result Value Ref Range   WBC 4.7 3.4 - 10.8 x10E3/uL   RBC 4.08 3.77 - 5.28 x10E6/uL    Hemoglobin 13.4 11.1 - 15.9 g/dL   Hematocrit 58.7 65.9 - 46.6 %   MCV 101 (H) 79 - 97 fL   MCH 32.8 26.6 - 33.0 pg   MCHC 32.5 31.5 - 35.7 g/dL   RDW 87.5 88.2 - 84.5 %   Platelets 227 150 - 450 x10E3/uL   Neutrophils 72 Not Estab. %   Lymphs 21 Not Estab. %   Monocytes 7 Not Estab. %   Eos 0 Not Estab. %  Basos 0 Not Estab. %   Neutrophils Absolute 3.4 1.4 - 7.0 x10E3/uL   Lymphocytes Absolute 1.0 0.7 - 3.1 x10E3/uL   Monocytes Absolute 0.3 0.1 - 0.9 x10E3/uL   EOS (ABSOLUTE) 0.0 0.0 - 0.4 x10E3/uL   Basophils Absolute 0.0 0.0 - 0.2 x10E3/uL   Immature Granulocytes 0 Not Estab. %   Immature Grans (Abs) 0.0 0.0 - 0.1 x10E3/uL  Lipid Panel w/o Chol/HDL Ratio   Collection Time: 09/22/23 10:10 AM  Result Value Ref Range   Cholesterol, Total 152 100 - 199 mg/dL   Triglycerides 67 0 - 149 mg/dL   HDL 42 >60 mg/dL   VLDL Cholesterol Cal 13 5 - 40 mg/dL   LDL Chol Calc (NIH) 97 0 - 99 mg/dL  Comprehensive metabolic panel with GFR   Collection Time: 09/22/23 10:10 AM  Result Value Ref Range   Glucose 111 (H) 70 - 99 mg/dL   BUN 13 8 - 27 mg/dL   Creatinine, Ser 9.12 0.57 - 1.00 mg/dL   eGFR 76 >40 fO/fpw/8.26   BUN/Creatinine Ratio 15 12 - 28   Sodium 144 134 - 144 mmol/L   Potassium 3.9 3.5 - 5.2 mmol/L   Chloride 106 96 - 106 mmol/L   CO2 23 20 - 29 mmol/L   Calcium  8.8 8.7 - 10.3 mg/dL   Total Protein 6.4 6.0 - 8.5 g/dL   Albumin 3.8 (L) 3.9 - 4.9 g/dL   Globulin, Total 2.6 1.5 - 4.5 g/dL   Bilirubin Total 0.5 0.0 - 1.2 mg/dL   Alkaline Phosphatase 91 44 - 121 IU/L   AST 20 0 - 40 IU/L   ALT 18 0 - 32 IU/L  VITAMIN D  25 Hydroxy (Vit-D Deficiency, Fractures)   Collection Time: 09/22/23 10:10 AM  Result Value Ref Range   Vit D, 25-Hydroxy 36.6 30.0 - 100.0 ng/mL  TSH   Collection Time: 09/22/23 10:10 AM  Result Value Ref Range   TSH 1.870 0.450 - 4.500 uIU/mL      Assessment & Plan:   Problem List Items Addressed This Visit       Other   Depression   Still  not doing great. Will increase her wellbutrin  to 300mg  and recheck in 6 weeks. Call with any concerns.       Relevant Medications   buPROPion  (WELLBUTRIN  XL) 300 MG 24 hr tablet   Anxiety   Still not doing great. Will increase her wellbutrin  to 300mg  and recheck in 6 weeks. Call with any concerns.       Relevant Medications   buPROPion  (WELLBUTRIN  XL) 300 MG 24 hr tablet   Other Visit Diagnoses       Acute non-recurrent maxillary sinusitis    -  Primary   Will treat with doxycycline . Call with any concerns. Continue to monitor.   Relevant Medications   doxycycline  (VIBRA -TABS) 100 MG tablet        Follow up plan: Return in about 6 weeks (around 12/05/2023) for OK to cancel appt 9/10.

## 2023-10-24 NOTE — Assessment & Plan Note (Signed)
 Still not doing great. Will increase her wellbutrin  to 300mg  and recheck in 6 weeks. Call with any concerns.

## 2023-10-27 DIAGNOSIS — H43811 Vitreous degeneration, right eye: Secondary | ICD-10-CM | POA: Diagnosis not present

## 2023-10-27 DIAGNOSIS — M329 Systemic lupus erythematosus, unspecified: Secondary | ICD-10-CM | POA: Diagnosis not present

## 2023-10-27 DIAGNOSIS — Z79899 Other long term (current) drug therapy: Secondary | ICD-10-CM | POA: Diagnosis not present

## 2023-11-02 ENCOUNTER — Other Ambulatory Visit: Payer: Self-pay | Admitting: Family Medicine

## 2023-11-03 ENCOUNTER — Other Ambulatory Visit: Payer: Self-pay | Admitting: Family Medicine

## 2023-11-03 NOTE — Telephone Encounter (Signed)
 Requested Prescriptions  Refused Prescriptions Disp Refills   buPROPion  (WELLBUTRIN  XL) 150 MG 24 hr tablet [Pharmacy Med Name: bupropion  HCl XL 150 mg 24 hr tablet, extended release] 30 tablet 0    Sig: TAKE ONE TABLET (150 MG TOTAL) BY MOUTH DAILY AT 9 AM     Psychiatry: Antidepressants - bupropion  Passed - 11/03/2023  5:21 PM      Passed - Cr in normal range and within 360 days    Creatinine, Ser  Date Value Ref Range Status  09/22/2023 0.87 0.57 - 1.00 mg/dL Final         Passed - AST in normal range and within 360 days    AST  Date Value Ref Range Status  09/22/2023 20 0 - 40 IU/L Final         Passed - ALT in normal range and within 360 days    ALT  Date Value Ref Range Status  09/22/2023 18 0 - 32 IU/L Final         Passed - Completed PHQ-2 or PHQ-9 in the last 360 days      Passed - Last BP in normal range    BP Readings from Last 1 Encounters:  10/24/23 117/79         Passed - Valid encounter within last 6 months    Recent Outpatient Visits           1 week ago Acute non-recurrent maxillary sinusitis   [place] [facility] [place], Megan P, DO   1 month ago Moderate episode of recurrent major depressive disorder [facility])   [place] [facility] [place], Megan P, DO   5 months ago Acute non-recurrent frontal sinusitis   [place] [facility] Melvin Pao, NP

## 2023-11-04 NOTE — Telephone Encounter (Signed)
 Requested Prescriptions  Refused Prescriptions Disp Refills   levothyroxine  (SYNTHROID ) 75 MCG tablet [Pharmacy Med Name: LEVOTHYROXINE  75 MCG TABLET] 90 tablet 1    Sig: TAKE 1 TABLET BY MOUTH EVERY DAY BEFORE BREAKFAST     Endocrinology:  Hypothyroid Agents Passed - 11/04/2023  2:04 PM      Passed - TSH in normal range and within 360 days    TSH  Date Value Ref Range Status  09/22/2023 1.870 0.450 - 4.500 uIU/mL Final         Passed - Valid encounter within last 12 months    Recent Outpatient Visits           1 week ago Acute non-recurrent maxillary sinusitis   [place] [facility] [place], Megan P, DO   1 month ago Moderate episode of recurrent major depressive disorder [facility])   [place] [facility] [place], Megan P, DO   5 months ago Acute non-recurrent frontal sinusitis   [place] [facility] Melvin Pao, NP

## 2023-11-09 ENCOUNTER — Ambulatory Visit: Payer: Self-pay

## 2023-11-09 NOTE — Telephone Encounter (Signed)
 Needs follow-up

## 2023-11-09 NOTE — Telephone Encounter (Signed)
 Routing to provider to advise.

## 2023-11-09 NOTE — Telephone Encounter (Signed)
 FYI Only or Action Required?: Action required by provider: Requesting medication.  Patient was last seen in primary care on 10/24/2023 by Vicci Duwaine SQUIBB, DO.  Called Nurse Triage reporting Facial Pain.  Symptoms began several weeks ago.  Interventions attempted: Prescription medications: Doxycycline .  Symptoms are: unchanged.  Triage Disposition: See PCP When Office is Open (Within 3 Days)  Patient/caregiver understands and will follow disposition?: Yes     Message from Mia F sent at 11/09/2023 11:44 AM EDT  Was diagnoses with a sinus infection and given an antibiotic she has finished the medication two days ago but symptoms did not go away at all. She is having facial pressure, burning in the head, congestion, pressure in ears, and post nasal drainage.   Reason for Disposition  [1] Taking antibiotic > 7 days AND [2] nasal discharge not improved  Answer Assessment - Initial Assessment Questions Felt symptoms were subsiding while on antibiotic, but now its not better. Experiencing post nasal drip and feeling a intermittent burning sensation in front upper forehead area. Clear nasal drainage. Denying office visit, [facility]   1. ANTIBIOTIC: What antibiotic are you taking? How many times a day?     Doxycycline  1x daily  2. ONSET: When was the antibiotic started?     August 18th   3. PAIN: How bad is the pain?   (Scale 0-10; or none, mild, moderate or severe)     5/10  4. FEVER: Do you have a fever? If Yes, ask: What is it, how was it measured, and when did it start?      No  Protocols used: Sinus Infection on Antibiotic Follow-up Call-A-AH

## 2023-11-10 NOTE — Telephone Encounter (Signed)
 Yes. If she wants another antibiotic we will need to see her this week

## 2023-11-10 NOTE — Telephone Encounter (Signed)
 Called patient and left a message for her to call back to get scheduled for an appt this week.

## 2023-11-14 NOTE — Telephone Encounter (Signed)
Left message on voicemail for patient to call and schedule an appt.

## 2023-11-16 ENCOUNTER — Ambulatory Visit: Admitting: Family Medicine

## 2023-11-21 ENCOUNTER — Other Ambulatory Visit: Payer: Self-pay | Admitting: Family Medicine

## 2023-11-21 NOTE — Telephone Encounter (Unsigned)
 Copied from CRM [id number]. Topic: Clinical - Medication Refill >> Nov 21, 2023  1:00 PM Antwanette L wrote: Medication: levothyroxine  (SYNTHROID ) 75 MCG tablet and buPROPion  (WELLBUTRIN  XL) 300 MG 24 hr tablet   Has the patient contacted their pharmacy? Yes   This is the patient's preferred pharmacy:  [id number] [place] [place] - [address] [place] [address] Phone: [phone] Fax: [phone]  Is this the correct pharmacy for this prescription? Yes.  Has the prescription been filled recently? Yes. Bupropion  was refilled  was on 10/24/23 and levothroxine was refilled on 09/29/23  Is the patient out of the medication? No  Has the patient been seen for an appointment in the last year OR does the patient have an upcoming appointment? Yes. Last ov w/ Dr. Vicci was on 10/24/23.  Can we respond through MyChart? No. Patient can be contacted by phone at [phone]  Agent: Please be advised that Rx refills may take up to 3 business days. We ask that you follow-up with your pharmacy.

## 2023-11-22 MED ORDER — LEVOTHYROXINE SODIUM 75 MCG PO TABS: 75.0000 ug | ORAL_TABLET | Freq: Every day | ORAL | 3 refills | Status: AC

## 2023-11-22 NOTE — Telephone Encounter (Signed)
 Requested Prescriptions  Pending Prescriptions Disp Refills   levothyroxine  (SYNTHROID ) 75 MCG tablet 90 tablet 3    Sig: Take 1 tablet (75 mcg total) by mouth daily before breakfast.     Endocrinology:  Hypothyroid Agents Passed - 11/22/2023  5:49 PM      Passed - TSH in normal range and within 360 days    TSH  Date Value Ref Range Status  09/22/2023 1.870 0.450 - 4.500 uIU/mL Final         Passed - Valid encounter within last 12 months    Recent Outpatient Visits           4 weeks ago Acute non-recurrent maxillary sinusitis   [place] [facility] [place], Megan P, DO   2 months ago Moderate episode of recurrent major depressive disorder [facility])   [place] [facility] [place], Megan P, DO   5 months ago Acute non-recurrent frontal sinusitis   [place] [facility] Melvin Pao, NP              Refused Prescriptions Disp Refills   buPROPion  (WELLBUTRIN  XL) 300 MG 24 hr tablet 30 tablet 2    Sig: Take 1 tablet (300 mg total) by mouth daily.     Psychiatry: Antidepressants - bupropion  Passed - 11/22/2023  5:49 PM      Passed - Cr in normal range and within 360 days    Creatinine, Ser  Date Value Ref Range Status  09/22/2023 0.87 0.57 - 1.00 mg/dL Final         Passed - AST in normal range and within 360 days    AST  Date Value Ref Range Status  09/22/2023 20 0 - 40 IU/L Final         Passed - ALT in normal range and within 360 days    ALT  Date Value Ref Range Status  09/22/2023 18 0 - 32 IU/L Final         Passed - Completed PHQ-2 or PHQ-9 in the last 360 days      Passed - Last BP in normal range    BP Readings from Last 1 Encounters:  10/24/23 117/79         Passed - Valid encounter within last 6 months    Recent Outpatient Visits           4 weeks ago Acute non-recurrent maxillary sinusitis   [place] [facility] [place], [place] P, DO   2 months ago Moderate episode of recurrent major  depressive disorder [facility])   [place] [facility] [place], Megan P, DO   5 months ago Acute non-recurrent frontal sinusitis   [place] [facility] Melvin Pao, NP

## 2023-12-02 ENCOUNTER — Other Ambulatory Visit: Payer: Self-pay | Admitting: Family Medicine

## 2023-12-05 NOTE — Telephone Encounter (Signed)
 Too soon for refill, LRF 10/24/23 FOR 30AND 2 RF.  Requested Prescriptions  Pending Prescriptions Disp Refills   buPROPion  (WELLBUTRIN  XL) 150 MG 24 hr tablet [Pharmacy Med Name: bupropion  HCl XL 150 mg 24 hr tablet, extended release] 30 tablet 0    Sig: TAKE ONE TABLET (150 MG TOTAL) BY MOUTH DAILY AT 9 AM     Psychiatry: Antidepressants - bupropion  Passed - 12/05/2023 12:08 PM      Passed - Cr in normal range and within 360 days    Creatinine, Ser  Date Value Ref Range Status  09/22/2023 0.87 0.57 - 1.00 mg/dL Final         Passed - AST in normal range and within 360 days    AST  Date Value Ref Range Status  09/22/2023 20 0 - 40 IU/L Final         Passed - ALT in normal range and within 360 days    ALT  Date Value Ref Range Status  09/22/2023 18 0 - 32 IU/L Final         Passed - Completed PHQ-2 or PHQ-9 in the last 360 days      Passed - Last BP in normal range    BP Readings from Last 1 Encounters:  10/24/23 117/79         Passed - Valid encounter within last 6 months    Recent Outpatient Visits           1 month ago Acute non-recurrent maxillary sinusitis   [place] [facility] [place], Megan P, DO   2 months ago Moderate episode of recurrent major depressive disorder [facility])   [facility] [place], Megan P, DO   6 months ago Acute non-recurrent frontal sinusitis   [place] [facility] Melvin Pao, NP

## 2023-12-09 ENCOUNTER — Ambulatory Visit (INDEPENDENT_AMBULATORY_CARE_PROVIDER_SITE_OTHER): Admitting: Family Medicine

## 2023-12-09 ENCOUNTER — Encounter: Payer: Self-pay | Admitting: Family Medicine

## 2023-12-09 VITALS — BP 125/83 | HR 73 | Temp 98.0°F | Ht 62.5 in | Wt 261.8 lb

## 2023-12-09 DIAGNOSIS — Z23 Encounter for immunization: Secondary | ICD-10-CM | POA: Diagnosis not present

## 2023-12-09 DIAGNOSIS — G4733 Obstructive sleep apnea (adult) (pediatric): Secondary | ICD-10-CM

## 2023-12-09 DIAGNOSIS — R600 Localized edema: Secondary | ICD-10-CM

## 2023-12-09 DIAGNOSIS — F331 Major depressive disorder, recurrent, moderate: Secondary | ICD-10-CM

## 2023-12-09 MED ORDER — BUPROPION HCL ER (XL) 300 MG PO TB24: 300.0000 mg | ORAL_TABLET | Freq: Every day | ORAL | 1 refills | Status: DC

## 2023-12-09 NOTE — Assessment & Plan Note (Signed)
 Under good control on current regimen. Continue current regimen. Continue to monitor. Call with any concerns. Refills given.

## 2023-12-09 NOTE — Assessment & Plan Note (Signed)
 Will repeat sleep study. Continue CPAP. May benefit from zepbound. Await results.

## 2023-12-09 NOTE — Progress Notes (Signed)
 BP 125/83   Pulse 73   Temp 98 F (36.7 C) (Oral)   Ht 5' 2.5 (1.588 m)   Wt 261 lb 12.8 oz (118.8 kg)   SpO2 98%   BMI 47.12 kg/m    Subjective:    Patient ID: Kaylee Fox, female    DOB: February 14, 1961, 63 y.o.   MRN: [id number]  HPI: Kaylee Fox is a 63 y.o. female  Chief Complaint  Patient presents with   Depression   Edema    Both ankles, noticed today. Some pain.    Obesity    Would like to discuss weight loss meds     DEPRESSION Mood status: better Satisfied with current treatment?: yes Symptom severity: mild  Duration of current treatment : months Side effects: no Medication compliance: excellent compliance Psychotherapy/counseling: no  Previous psychiatric medications: effexor , wellbutrin  Depressed mood: no Anxious mood: no Anhedonia: no Significant weight loss or gain: no Insomnia: no  Fatigue: yes Feelings of worthlessness or guilt: no Impaired concentration/indecisiveness: no Suicidal ideations: no Hopelessness: no Crying spells: no    12/09/2023    3:29 PM 12/09/2023    3:25 PM 09/22/2023    9:39 AM 06/01/2023   10:57 AM 03/08/2023   10:21 AM  Depression screen PHQ 2/9  Decreased Interest 1 1 2 2 2   Down, Depressed, Hopeless 0 0 3 0 0  PHQ - 2 Score 1 1 5 2 2   Altered sleeping 2 2 3 2 1   Tired, decreased energy 2 2 3 2 1   Change in appetite 0 0 3 2 0  Feeling bad or failure about yourself  0 0 3 1 0  Trouble concentrating 0 0 3 1 0  Moving slowly or fidgety/restless 0 0 1 0 1  Suicidal thoughts 0 0 0 0 0  PHQ-9 Score 5 5 21 10 5   Difficult doing work/chores Somewhat difficult Somewhat difficult Not difficult at all  Not difficult at all    SLEEP APNEA Sleep apnea status: stable Duration: chronic Satisfied with current treatment?:  yes CPAP use:  yes Sleep quality with CPAP use: excellent Treament compliance:excellent compliance Last sleep study: many years ago Treatments attempted: CPAP Wakes feeling refreshed:   yes Daytime hypersomnolence:  no Fatigue:  yes Insomnia:  no Good sleep hygiene:  no Difficulty falling asleep:  no Difficulty staying asleep:  no Snoring bothers bed partner:  yes Observed apnea by bed partner: yes Obesity:  yes Hypertension: yes  Pulmonary hypertension:  no Coronary artery disease:  no   Relevant past medical, surgical, family and social history reviewed and updated as indicated. Interim medical history since our last visit reviewed. Allergies and medications reviewed and updated.  Review of Systems  Constitutional: Negative.   Respiratory: Negative.    Cardiovascular:  Positive for leg swelling. Negative for chest pain and palpitations.  Musculoskeletal: Negative.   Neurological: Negative.   Psychiatric/Behavioral: Negative.      Per HPI unless specifically indicated above     Objective:    BP 125/83   Pulse 73   Temp 98 F (36.7 C) (Oral)   Ht 5' 2.5 (1.588 m)   Wt 261 lb 12.8 oz (118.8 kg)   SpO2 98%   BMI 47.12 kg/m   Wt Readings from Last 3 Encounters:  12/09/23 261 lb 12.8 oz (118.8 kg)  10/24/23 265 lb 3.2 oz (120.3 kg)  09/22/23 266 lb 12.8 oz (121 kg)    Physical Exam Vitals and nursing note reviewed.  Constitutional:      General: She is not in acute distress.    Appearance: Normal appearance. She is obese. She is not ill-appearing, toxic-appearing or diaphoretic.  HENT:     Head: Normocephalic and atraumatic.     Right Ear: External ear normal.     Left Ear: External ear normal.     Nose: Nose normal.     Mouth/Throat:     Mouth: Mucous membranes are moist.     Pharynx: Oropharynx is clear.  Eyes:     General: No scleral icterus.       Right eye: No discharge.        Left eye: No discharge.     Extraocular Movements: Extraocular movements intact.     Conjunctiva/sclera: Conjunctivae normal.     Pupils: Pupils are equal, round, and reactive to light.  Cardiovascular:     Rate and Rhythm: Normal rate and regular rhythm.      Pulses: Normal pulses.     Heart sounds: Normal heart sounds. No murmur heard.    No friction rub. No gallop.  Pulmonary:     Effort: Pulmonary effort is normal. No respiratory distress.     Breath sounds: Normal breath sounds. No stridor. No wheezing, rhonchi or rales.  Chest:     Chest wall: No tenderness.  Musculoskeletal:        General: Normal range of motion.     Cervical back: Normal range of motion and neck supple.     Right lower leg: Edema present.     Left lower leg: Edema present.  Skin:    General: Skin is warm and dry.     Capillary Refill: Capillary refill takes less than 2 seconds.     Coloration: Skin is not jaundiced or pale.     Findings: No bruising, erythema, lesion or rash.  Neurological:     General: No focal deficit present.     Mental Status: She is alert and oriented to person, place, and time. Mental status is at baseline.  Psychiatric:        Mood and Affect: Mood normal.        Behavior: Behavior normal.        Thought Content: Thought content normal.        Judgment: Judgment normal.     Results for orders placed or performed in visit on 09/22/23  CBC with Differential/Platelet   Collection Time: 09/22/23 10:10 AM  Result Value Ref Range   WBC 4.7 3.4 - 10.8 x10E3/uL   RBC 4.08 3.77 - 5.28 x10E6/uL   Hemoglobin 13.4 11.1 - 15.9 g/dL   Hematocrit 58.7 65.9 - 46.6 %   MCV 101 (H) 79 - 97 fL   MCH 32.8 26.6 - 33.0 pg   MCHC 32.5 31.5 - 35.7 g/dL   RDW 87.5 88.2 - 84.5 %   Platelets 227 150 - 450 x10E3/uL   Neutrophils 72 Not Estab. %   Lymphs 21 Not Estab. %   Monocytes 7 Not Estab. %   Eos 0 Not Estab. %   Basos 0 Not Estab. %   Neutrophils Absolute 3.4 1.4 - 7.0 x10E3/uL   Lymphocytes Absolute 1.0 0.7 - 3.1 x10E3/uL   Monocytes Absolute 0.3 0.1 - 0.9 x10E3/uL   EOS (ABSOLUTE) 0.0 0.0 - 0.4 x10E3/uL   Basophils Absolute 0.0 0.0 - 0.2 x10E3/uL   Immature Granulocytes 0 Not Estab. %   Immature Grans (Abs) 0.0 0.0 - 0.1 x10E3/uL  Lipid Panel w/o Chol/HDL Ratio   Collection Time: 09/22/23 10:10 AM  Result Value Ref Range   Cholesterol, Total 152 100 - 199 mg/dL   Triglycerides 67 0 - 149 mg/dL   HDL 42 >60 mg/dL   VLDL Cholesterol Cal 13 5 - 40 mg/dL   LDL Chol Calc (NIH) 97 0 - 99 mg/dL  Comprehensive metabolic panel with GFR   Collection Time: 09/22/23 10:10 AM  Result Value Ref Range   Glucose 111 (H) 70 - 99 mg/dL   BUN 13 8 - 27 mg/dL   Creatinine, Ser 9.12 0.57 - 1.00 mg/dL   eGFR 76 >40 fO/fpw/8.26   BUN/Creatinine Ratio 15 12 - 28   Sodium 144 134 - 144 mmol/L   Potassium 3.9 3.5 - 5.2 mmol/L   Chloride 106 96 - 106 mmol/L   CO2 23 20 - 29 mmol/L   Calcium  8.8 8.7 - 10.3 mg/dL   Total Protein 6.4 6.0 - 8.5 g/dL   Albumin 3.8 (L) 3.9 - 4.9 g/dL   Globulin, Total 2.6 1.5 - 4.5 g/dL   Bilirubin Total 0.5 0.0 - 1.2 mg/dL   Alkaline Phosphatase 91 44 - 121 IU/L   AST 20 0 - 40 IU/L   ALT 18 0 - 32 IU/L  VITAMIN D  25 Hydroxy (Vit-D Deficiency, Fractures)   Collection Time: 09/22/23 10:10 AM  Result Value Ref Range   Vit D, 25-Hydroxy 36.6 30.0 - 100.0 ng/mL  TSH   Collection Time: 09/22/23 10:10 AM  Result Value Ref Range   TSH 1.870 0.450 - 4.500 uIU/mL      Assessment & Plan:   Problem List Items Addressed This Visit       Respiratory   OSA on CPAP - Primary   Will repeat sleep study. Continue CPAP. May benefit from zepbound. Await results.       Relevant Orders   Ambulatory referral to Sleep Studies   Other Visit Diagnoses       Peripheral edema       Will check labs and use compression hose. Call with any concerns.   Relevant Orders   B Nat Peptide   CBC with Differential/Platelet   Comprehensive metabolic panel with GFR   TSH     Needs flu shot       Flu shot given today.   Relevant Orders   Flu vaccine trivalent PF, 6mos and older(Flulaval,Afluria,Fluarix,Fluzone)     Need for pneumococcal vaccination       Prevnar given today.   Relevant Orders   Pneumococcal  conjugate vaccine 20-valent (Prevnar 20)        Follow up plan: Return in about 6 weeks (around 01/20/2024).

## 2023-12-12 ENCOUNTER — Ambulatory Visit: Payer: Self-pay | Admitting: Family Medicine

## 2023-12-12 LAB — CBC WITH DIFFERENTIAL/PLATELET
Basophils Absolute: 0 x10E3/uL (ref 0.0–0.2)
Basos: 1 %
EOS (ABSOLUTE): 0 x10E3/uL (ref 0.0–0.4)
Eos: 0 %
Hematocrit: 37.4 % (ref 34.0–46.6)
Hemoglobin: 12.2 g/dL (ref 11.1–15.9)
Immature Grans (Abs): 0 x10E3/uL (ref 0.0–0.1)
Immature Granulocytes: 0 %
Lymphocytes Absolute: 0.9 x10E3/uL (ref 0.7–3.1)
Lymphs: 20 %
MCH: 32.8 pg (ref 26.6–33.0)
MCHC: 32.6 g/dL (ref 31.5–35.7)
MCV: 101 fL — ABNORMAL HIGH (ref 79–97)
Monocytes Absolute: 0.4 x10E3/uL (ref 0.1–0.9)
Monocytes: 8 %
Neutrophils Absolute: 3.1 x10E3/uL (ref 1.4–7.0)
Neutrophils: 70 %
Platelets: 225 x10E3/uL (ref 150–450)
RBC: 3.72 x10E6/uL — ABNORMAL LOW (ref 3.77–5.28)
RDW: 12.3 % (ref 11.7–15.4)
WBC: 4.3 x10E3/uL (ref 3.4–10.8)

## 2023-12-12 LAB — COMPREHENSIVE METABOLIC PANEL WITH GFR
ALT: 15 IU/L (ref 0–32)
AST: 23 IU/L (ref 0–40)
Albumin: 4.1 g/dL (ref 3.9–4.9)
Alkaline Phosphatase: 103 IU/L (ref 49–135)
BUN/Creatinine Ratio: 13 (ref 12–28)
BUN: 12 mg/dL (ref 8–27)
Bilirubin Total: 0.6 mg/dL (ref 0.0–1.2)
CO2: 23 mmol/L (ref 20–29)
Calcium: 8.8 mg/dL (ref 8.7–10.3)
Chloride: 104 mmol/L (ref 96–106)
Creatinine, Ser: 0.94 mg/dL (ref 0.57–1.00)
Globulin, Total: 2.5 g/dL (ref 1.5–4.5)
Glucose: 107 mg/dL — ABNORMAL HIGH (ref 70–99)
Potassium: 3.7 mmol/L (ref 3.5–5.2)
Sodium: 143 mmol/L (ref 134–144)
Total Protein: 6.6 g/dL (ref 6.0–8.5)
eGFR: 69 mL/min/1.73 (ref >59)

## 2023-12-12 LAB — BRAIN NATRIURETIC PEPTIDE

## 2023-12-12 LAB — TSH: TSH: 2.83 u[IU]/mL (ref 0.450–4.500)

## 2023-12-14 ENCOUNTER — Other Ambulatory Visit: Payer: Self-pay | Admitting: Family Medicine

## 2023-12-14 NOTE — Telephone Encounter (Unsigned)
 Copied from CRM #[id number]. Topic: Clinical - Medication Refill >> Dec 14, 2023 10:50 AM Emylou G wrote: Medication: buPROPion  (WELLBUTRIN  XL) 300 MG 24 hr tablet cholestyramine  (QUESTRAN ) 4 g packet   Has the patient contacted their pharmacy? No (Agent: If no, request that the patient contact the pharmacy for the refill. If patient does not wish to contact the pharmacy document the reason why and proceed with request.) (Agent: If yes, when and what did the pharmacy advise?)  This is the patient's preferred pharmacy:    [facility] - [place], [place] - [address] [place] [address] Phone: [phone] Fax: [phone]  Is this the correct pharmacy for this prescription? Yes If no, delete pharmacy and type the correct one.   Has the prescription been filled recently? Yes  Is the patient out of the medication? Yes  Has the patient been seen for an appointment in the last year OR does the patient have an upcoming appointment? Yes  Can we respond through MyChart? No  Agent: Please be advised that Rx refills may take up to 3 business days. We ask that you follow-up with your pharmacy.

## 2023-12-16 MED ORDER — CHOLESTYRAMINE 4 G PO PACK: 4.0000 g | PACK | Freq: Three times a day (TID) | ORAL | 3 refills | Status: DC

## 2023-12-16 NOTE — Telephone Encounter (Signed)
 Requested Prescriptions  Pending Prescriptions Disp Refills   buPROPion  (WELLBUTRIN  XL) 300 MG 24 hr tablet 90 tablet 1    Sig: Take 1 tablet (300 mg total) by mouth daily.     Psychiatry: Antidepressants - bupropion  Passed - 12/16/2023  9:21 AM      Passed - Cr in normal range and within 360 days    Creatinine, Ser  Date Value Ref Range Status  12/09/2023 0.94 0.57 - 1.00 mg/dL Final         Passed - AST in normal range and within 360 days    AST  Date Value Ref Range Status  12/09/2023 23 0 - 40 IU/L Final         Passed - ALT in normal range and within 360 days    ALT  Date Value Ref Range Status  12/09/2023 15 0 - 32 IU/L Final         Passed - Completed PHQ-2 or PHQ-9 in the last 360 days      Passed - Last BP in normal range    BP Readings from Last 1 Encounters:  12/09/23 125/83         Passed - Valid encounter within last 6 months    Recent Outpatient Visits           1 week ago OSA on CPAP   [facility] [place], Megan P, DO   1 month ago Acute non-recurrent maxillary sinusitis   [place] [facility] [place], [place] P, DO   2 months ago Moderate episode of recurrent major depressive disorder [facility])   [place] [facility] [place], Megan P, DO   6 months ago Acute non-recurrent frontal sinusitis   [place] [facility] Melvin Pao, NP               cholestyramine  (QUESTRAN ) 4 g packet 60 each 3    Sig: Take 1 packet (4 g total) by mouth 3 (three) times daily with meals.     Cardiovascular:  Antilipid - Bile Acid Sequestrants Failed - 12/16/2023  9:21 AM      Failed - Lipid Panel in normal range within the last 12 months    Cholesterol, Total  Date Value Ref Range Status  09/22/2023 152 100 - 199 mg/dL Final   LDL Chol Calc (NIH)  Date Value Ref Range Status  09/22/2023 97 0 - 99 mg/dL Final   HDL  Date Value Ref Range Status  09/22/2023 42 >39 mg/dL Final    Triglycerides  Date Value Ref Range Status  09/22/2023 67 0 - 149 mg/dL Final         Passed - Valid encounter within last 12 months    Recent Outpatient Visits           1 week ago OSA on CPAP   [place] [facility] [place], Megan P, DO   1 month ago Acute non-recurrent maxillary sinusitis   [place] [facility] [place], Megan P, DO   2 months ago Moderate episode of recurrent major depressive disorder [facility])   [place] [facility] [place], Megan P, DO   6 months ago Acute non-recurrent frontal sinusitis   [place] [facility] Melvin Pao, NP

## 2023-12-22 ENCOUNTER — Other Ambulatory Visit: Payer: Self-pay | Admitting: Family Medicine

## 2023-12-22 NOTE — Telephone Encounter (Unsigned)
 Copied from CRM #[id number]. Topic: Clinical - Medication Refill >> Dec 22, 2023  9:25 AM Turkey B wrote: Medication: cholestyramine  (QUESTRAN ) 4 g packet/venlafaxine  XR (EFFEXOR -XR) 75 MG 24 hr/albuterol  (VENTOLIN  HFA) 108 (90 Base) MCG/ACT  Has the patient contacted their pharmacy? yes (Agent: If yes, when and what did the pharmacy advise?)pharmacy called in directly  This is the patient's preferred pharmacy:   [facility] - [place] - [address] [place] [address] Phone: [phone] Fax: [phone]  Is this the correct pharmacy for this prescription? yes   Has the prescription been filled recently? no  Is the patient out of the medication? Wlll be out soon  Has the patient been seen for an appointment in the last year OR does the patient have an upcoming appointment? yes  Can we respond through MyChart? yes  Agent: Please be advised that Rx refills may take up to 3 business days. We ask that you follow-up with your pharmacy.

## 2024-01-02 ENCOUNTER — Other Ambulatory Visit: Payer: Self-pay | Admitting: Family Medicine

## 2024-01-02 ENCOUNTER — Ambulatory Visit: Payer: Self-pay

## 2024-01-02 NOTE — Telephone Encounter (Signed)
 FYI Only or Action Required?: FYI only for provider.  Patient was last seen in primary care on 12/09/2023 by Vicci Bouchard P, DO.  Called Nurse Triage reporting Cough.  Symptoms began a week ago.  Interventions attempted: OTC medications: mucinex.  Symptoms are: unchanged.  Triage Disposition: See Physician Within 24 Hours  Patient/caregiver understands and will follow disposition?: Yes    Copied from CRM #[id number]. Topic: Clinical - Red Word Triage >> Jan 02, 2024  4:10 PM Carla L wrote: Red Word that prompted transfer to Nurse Triage: productive cough, between clear and light brown, going on for a week Reason for Disposition  Coughing up rusty-colored (reddish-brown) sputum  Answer Assessment - Initial Assessment Questions 1. ONSET: When did the cough begin?      About a week ago 2. SEVERITY: How bad is the cough today?      Still coughing, deep cough 3. SPUTUM: Describe the color of your sputum (e.g., none, dry cough; clear, white, yellow, green)     Light brown, was clear initiially 4. HEMOPTYSIS: Are you coughing up any blood? If Yes, ask: How much? (e.g., flecks, streaks, tablespoons, etc.)     no 5. DIFFICULTY BREATHING: Are you having difficulty breathing? If Yes, ask: How bad is it? (e.g., mild, moderate, severe)      no 6. FEVER: Do you have a fever? If Yes, ask: What is your temperature, how was it measured, and when did it start?     no 7. CARDIAC HISTORY: Do you have any history of heart disease? (e.g., heart attack, congestive heart failure)      no 8. LUNG HISTORY: Do you have any history of lung disease?  (e.g., pulmonary embolus, asthma, emphysema)     no  10. OTHER SYMPTOMS: Do you have any other symptoms? (e.g., runny nose, wheezing, chest pain)       Running nose  Protocols used: Cough - Acute Productive-A-AH

## 2024-01-03 ENCOUNTER — Encounter: Payer: Self-pay | Admitting: Pediatrics

## 2024-01-03 ENCOUNTER — Ambulatory Visit (INDEPENDENT_AMBULATORY_CARE_PROVIDER_SITE_OTHER): Admitting: Pediatrics

## 2024-01-03 VITALS — BP 113/77 | HR 71 | Temp 97.6°F | Wt 264.3 lb

## 2024-01-03 DIAGNOSIS — J189 Pneumonia, unspecified organism: Secondary | ICD-10-CM | POA: Diagnosis not present

## 2024-01-03 MED ORDER — AZITHROMYCIN 250 MG PO TABS: ORAL_TABLET | ORAL | 0 refills | Status: AC

## 2024-01-03 NOTE — Patient Instructions (Signed)
 Most cold symptoms last up to 2 weeks, but cough can sometimes linger up to 4 weeks.  However if your symtpoms get WORSE - like you develop fevers or get more shortness of breath, then call your clinic as you may need to be evaluated.   Aches and Pains Acetaminophen (Tylenol): 1000mg  ("extra strength" tablets are 500mg , so take 2) every 8 hours if needed  Ibuprofen (Advil/Motrin) 400-800mg  (comes in 200mg  pills OTC, so 2-4 pills) every 8 hours. - Avoid in excess if cardiac blood pressure issues  Sore Throat:  See Aches and Pains meds above, also Sore throat sprays and lozenges may also help.   Cough:  Honey 2 TBS every 4-6 hours if needed.  Robitussin DM syrup or generic equivalent which has (guaifenesin = an expectorant to help you get stuff up + dextromethorphan (DM) = cough supressant). You can also get this in tablet formula (like Mucinex DM or generic equivalent).  If you have asthma or are wheezing and have a tight chest, then albuterol inhaler (Ventolin, ProAir) may be helpful - you need a prescription for this.   Congestion:  oxymetazoline (Afrin) nasal stray: 2 sprays each nostril every 12 hours. Don't use more than 3 days in a row to avoid building a tolerance to it.  Sinus rinse (neti pot) high volume sinus rinse can help open up your sinuses and be helpful, especially if you're having sinus pressure and headaches.   Other:  Umcka (pelargonium sidoides extract) can to shorten cold symptoms (can be hard to find, but [employer] carries it: brand name Umcka ColdCare from [employer]). Works best if you start taking at earliest signs of cold symptoms.  Andrographis paniculata is another herbal remedy with less evidence, but may reduce common cold symptoms in adults.  zinc acetate lozenges >= 80 mg/day reduces duration but not severity of cold symptoms in adults, but it is associated with bad taste and nausea Heated humidified air may reduce cold symptoms, so try using a humidifier -  especially in your bedroom at night.  Stay hydrated! Aim to drink at least 2 liters of water daily.   What doesn't work (but lots of folks think might) Vitamin C: bummer right?! But there's no evidence that high dose vitamin C will help cold symptoms.

## 2024-01-03 NOTE — Progress Notes (Signed)
 Office Visit  BP 113/77   Pulse 71   Temp 97.6 F (36.4 C) (Oral)   Wt 264 lb 4.8 oz (119.9 kg)   SpO2 99%   BMI 47.57 kg/m    Subjective:    Patient ID: Kaylee Fox, female    DOB: 1960-09-15, 63 y.o.   MRN: [id number]  HPI: Kaylee Fox is a 63 y.o. female  Chief Complaint  Patient presents with   Cough    Ongoing of about a week     Discussed the use of AI scribe software for clinical note transcription with the patient, who gave verbal consent to proceed.  History of Present Illness   Kaylee Fox is a 63 year old female with lupus who presents with a persistent cough for one week.  She has been experiencing a dry cough for the past week, initially accompanied by a headache. There are no other symptoms such as shortness of breath, fever, or chills. The cough is non-productive, with no sputum.  Her past medical history includes lupus, which has previously affected her lungs, resulting in blood clots on three occasions, the last of which occurred in 1997. She mentions a tendency to 'get sick real quick.'  She feels more rundown than usual. She has not taken Mucinex today, although she has been using it previously. No sinus pain is reported.     Relevant past medical, surgical, family and social history reviewed and updated as indicated. Interim medical history since our last visit reviewed. Allergies and medications reviewed and updated.  ROS per HPI unless specifically indicated above     Objective:    BP 113/77   Pulse 71   Temp 97.6 F (36.4 C) (Oral)   Wt 264 lb 4.8 oz (119.9 kg)   SpO2 99%   BMI 47.57 kg/m   Wt Readings from Last 3 Encounters:  01/03/24 264 lb 4.8 oz (119.9 kg)  12/09/23 261 lb 12.8 oz (118.8 kg)  10/24/23 265 lb 3.2 oz (120.3 kg)     Physical Exam Constitutional:      Appearance: Normal appearance.  Cardiovascular:     Rate and Rhythm: Normal rate and regular rhythm.     Pulses: Normal pulses.     Heart  sounds: Normal heart sounds.  Pulmonary:     Effort: Pulmonary effort is normal.     Breath sounds: Rales present.     Comments: Minimal rales appreciated in posterior upper bilateral lung field primarily left, otherwise clear lung sounds Musculoskeletal:        General: Normal range of motion.  Skin:    Comments: Normal skin color  Neurological:     General: No focal deficit present.     Mental Status: She is alert. Mental status is at baseline.  Psychiatric:        Mood and Affect: Mood normal.        Behavior: Behavior normal.        Thought Content: Thought content normal.         01/03/2024    9:29 AM 12/09/2023    3:29 PM 12/09/2023    3:25 PM 09/22/2023    9:39 AM 06/01/2023   10:57 AM  Depression screen PHQ 2/9  Decreased Interest 1 1 1 2 2   Down, Depressed, Hopeless 1 0 0 3 0  PHQ - 2 Score 2 1 1 5 2   Altered sleeping 3 2 2 3 2   Tired, decreased energy 0 2 2  3 2  Change in appetite 1 0 0 3 2  Feeling bad or failure about yourself  1 0 0 3 1  Trouble concentrating 1 0 0 3 1  Moving slowly or fidgety/restless 1 0 0 1 0  Suicidal thoughts 1 0 0 0 0  PHQ-9 Score 10 5 5 21 10   Difficult doing work/chores Somewhat difficult Somewhat difficult Somewhat difficult Not difficult at all        01/03/2024    9:29 AM 12/09/2023    3:28 PM 09/22/2023    9:39 AM 06/01/2023   10:49 AM  GAD 7 : Generalized Anxiety Score  Nervous, Anxious, on Edge 1 1 3  0  Control/stop worrying 1 1 2  0  Worry too much - different things 1 0 2 0  Trouble relaxing 1 1 2  0  Restless 1 0 2 1  Easily annoyed or irritable 1 1 2 1   Afraid - awful might happen 1 0 2 1  Total GAD 7 Score 7 4 15 3   Anxiety Difficulty Somewhat difficult Somewhat difficult Not difficult at all        Assessment & Plan:  Assessment & Plan   Atypical pneumonia Dry cough for one week with mild rales in upper lung fields. No dyspnea, fever, or chills. Concern for atypical pneumonia given immunocompromised status. Did  have pulmonary manifestations of lupus in the past so may have some degree of scarring at baseline. Viral infection possibly leading to bacterial infection.  - Prescribed azithromycin  (Z[facility]Pak) to [facility]. - Advised monitoring symptoms and to call if no improvement by Thursday or Friday for reevaluation. - Consider chest x-ray if no improvement by Friday. - Recommended Mucinex, Robitussin, honey, and Afrin spray for congestion (limit Afrin to three days). -     Azithromycin ; Take 2 tablets on day 1, then 1 tablet daily on days 2 through 5  Dispense: 6 tablet; Refill: 0   Follow up plan: Return if symptoms worsen or fail to improve.  Hadassah SHAUNNA Nett, MD

## 2024-01-03 NOTE — Telephone Encounter (Signed)
 Discontinued 10/24/23.  Requested Prescriptions  Pending Prescriptions Disp Refills   buPROPion  (WELLBUTRIN  XL) 150 MG 24 hr tablet [Pharmacy Med Name: bupropion  HCl XL 150 mg 24 hr tablet, extended release] 30 tablet 0    Sig: TAKE ONE TABLET (150 MG TOTAL) BY MOUTH DAILY AT 9 AM     Psychiatry: Antidepressants - bupropion  Passed - 01/03/2024  4:02 PM      Passed - Cr in normal range and within 360 days    Creatinine, Ser  Date Value Ref Range Status  12/09/2023 0.94 0.57 - 1.00 mg/dL Final         Passed - AST in normal range and within 360 days    AST  Date Value Ref Range Status  12/09/2023 23 0 - 40 IU/L Final         Passed - ALT in normal range and within 360 days    ALT  Date Value Ref Range Status  12/09/2023 15 0 - 32 IU/L Final         Passed - Completed PHQ-2 or PHQ-9 in the last 360 days      Passed - Last BP in normal range    BP Readings from Last 1 Encounters:  01/03/24 113/77         Passed - Valid encounter within last 6 months    Recent Outpatient Visits           Today Atypical pneumonia   [facility] [place] Hadassah SQUIBB, MD   3 weeks ago OSA on CPAP   [facility] [place], [place] P, DO   2 months ago Acute non-recurrent maxillary sinusitis   [place] [facility] [place], Megan P, DO   3 months ago Moderate episode of recurrent major depressive disorder [facility])   [facility] [place], Megan P, DO   7 months ago Acute non-recurrent frontal sinusitis   [place] [facility] Melvin Pao, NP

## 2024-01-04 ENCOUNTER — Other Ambulatory Visit: Payer: Self-pay | Admitting: Family Medicine

## 2024-01-04 NOTE — Telephone Encounter (Unsigned)
 Copied from CRM [id number]. Topic: Clinical - Medication Refill >> Jan 04, 2024  2:37 PM Amy B wrote: Medication: cholestyramine  (QUESTRAN ) 4 g packet  Has the patient contacted their pharmacy? Yes (Agent: If no, request that the patient contact the pharmacy for the refill. If patient does not wish to contact the pharmacy document the reason why and proceed with request.) (Agent: If yes, when and what did the pharmacy advise?)  This is the patient's preferred pharmacy:   [facility] - [place], [place] - [address] [place] [address] Phone: [phone] Fax: [phone]  Is this the correct pharmacy for this prescription? Yes If no, delete pharmacy and type the correct one.   Has the prescription been filled recently? No  Is the patient out of the medication? Yes  Has the patient been seen for an appointment in the last year OR does the patient have an upcoming appointment? Yes  Can we respond through MyChart? Yes  Agent: Please be advised that Rx refills may take up to 3 business days. We ask that you follow-up with your pharmacy.

## 2024-01-06 NOTE — Telephone Encounter (Signed)
 Too soon for refill.  Requested Prescriptions  Pending Prescriptions Disp Refills   cholestyramine  (QUESTRAN ) 4 g packet 60 each 3    Sig: Take 1 packet (4 g total) by mouth 3 (three) times daily with meals.     Cardiovascular:  Antilipid - Bile Acid Sequestrants Failed - 01/06/2024 11:19 AM      Failed - Lipid Panel in normal range within the last 12 months    Cholesterol, Total  Date Value Ref Range Status  09/22/2023 152 100 - 199 mg/dL Final   LDL Chol Calc (NIH)  Date Value Ref Range Status  09/22/2023 97 0 - 99 mg/dL Final   HDL  Date Value Ref Range Status  09/22/2023 42 >39 mg/dL Final   Triglycerides  Date Value Ref Range Status  09/22/2023 67 0 - 149 mg/dL Final         Passed - Valid encounter within last 12 months    Recent Outpatient Visits           3 days ago Atypical pneumonia   [place] [facility] Her[place] Hadassah SQUIBB, MD   4 weeks ago OSA on CPAP   [place] [facility] [place], [place] P, DO   2 months ago Acute non-recurrent maxillary sinusitis   [place] [facility] [place], Megan P, DO   3 months ago Moderate episode of recurrent major depressive disorder [facility])   [place] [facility] [place], Megan P, DO   7 months ago Acute non-recurrent frontal sinusitis   [place] [facility] Melvin Pao, NP

## 2024-02-08 ENCOUNTER — Other Ambulatory Visit: Payer: Self-pay

## 2024-02-09 ENCOUNTER — Encounter: Payer: Self-pay | Admitting: Family Medicine

## 2024-02-09 ENCOUNTER — Ambulatory Visit: Admitting: Family Medicine

## 2024-02-09 VITALS — BP 122/83 | HR 70 | Temp 98.0°F | Ht 62.5 in | Wt 264.8 lb

## 2024-02-09 DIAGNOSIS — G4733 Obstructive sleep apnea (adult) (pediatric): Secondary | ICD-10-CM

## 2024-02-09 DIAGNOSIS — M79604 Pain in right leg: Secondary | ICD-10-CM | POA: Diagnosis not present

## 2024-02-09 DIAGNOSIS — F331 Major depressive disorder, recurrent, moderate: Secondary | ICD-10-CM | POA: Diagnosis not present

## 2024-02-09 DIAGNOSIS — M329 Systemic lupus erythematosus, unspecified: Secondary | ICD-10-CM

## 2024-02-09 MED ORDER — BUPROPION HCL ER (SR) 150 MG PO TB12: 300.0000 mg | ORAL_TABLET | Freq: Two times a day (BID) | ORAL | 3 refills | Status: AC

## 2024-02-09 MED ORDER — CHOLESTYRAMINE 4 G PO PACK: 4.0000 g | PACK | Freq: Three times a day (TID) | ORAL | 3 refills | Status: AC

## 2024-02-09 NOTE — Assessment & Plan Note (Signed)
 Not doing great on the long acting wellbutrin . Will change to short acting and recheck in about a month. May need to add additional medicine. Call with any concerns.

## 2024-02-09 NOTE — Assessment & Plan Note (Addendum)
 Doing OK with CPAP. Would benefit from zepbound. Will start. Awaiting AHI before sending Rx in. Await paperwork.

## 2024-02-09 NOTE — Progress Notes (Signed)
 BP 122/83   Pulse 70   Temp 98 F (36.7 C) (Oral)   Ht 5' 2.5 (1.588 m)   Wt 264 lb 12.8 oz (120.1 kg)   SpO2 97%   BMI 47.66 kg/m    Subjective:    Patient ID: Kaylee Fox, female    DOB: [birth year], 63 y.o.   MRN: [id number]  HPI: Kaylee Fox is a 63 y.o. female  Chief Complaint  Patient presents with   Depression    Wellbutrin  is not effective, doesn't last all day. More depressed would like to try another medication    DEPRESSION Duration: about 2 weeks Mood status: worse Satisfied with current treatment?: no Symptom severity: moderate  Duration of current treatment : chronic Side effects: no Medication compliance: excellent compliance Psychotherapy/counseling: no  Previous psychiatric medications: wellbutrin , effexor  Depressed mood: yes Anxious mood: yes Anhedonia: no Significant weight loss or gain: no Insomnia: no  Fatigue: yes Feelings of worthlessness or guilt: no Impaired concentration/indecisiveness: no Suicidal ideations: no Hopelessness: no Crying spells: no    02/09/2024    9:59 AM 01/03/2024    9:29 AM 12/09/2023    3:29 PM 12/09/2023    3:25 PM 09/22/2023    9:39 AM  Depression screen PHQ 2/9  Decreased Interest 2 1 1 1 2   Down, Depressed, Hopeless 0 1 0 0 3  PHQ - 2 Score 2 2 1 1 5   Altered sleeping 2 3 2 2 3   Tired, decreased energy  0 2 2 3   Change in appetite 2 1 0 0 3  Feeling bad or failure about yourself  0 1 0 0 3  Trouble concentrating 0 1 0 0 3  Moving slowly or fidgety/restless 0 1 0 0 1  Suicidal thoughts 0 1 0 0 0  PHQ-9 Score 6 10  5  5  21    Difficult doing work/chores Somewhat difficult Somewhat difficult Somewhat difficult Somewhat difficult Not difficult at all     Data saved with a previous flowsheet row definition   BACK PAIN Duration: weeks Mechanism of injury: unknown Location: L leg and hip Onset: sudden Severity: moderate to severe Quality: aching, shooting Frequency: intermittent Radiation:  down R leg Aggravating factors: movement Alleviating factors: rest Status: worse Treatments attempted: rest, ice, heat, APAP Relief with NSAIDs?: No NSAIDs Taken Nighttime pain:  no Paresthesias / decreased sensation:  yes Bowel / bladder incontinence:  no Fevers:  no Dysuria / urinary frequency:  no  SLEEP APNEA Sleep apnea status: stable Duration: chronic Satisfied with current treatment?:  yes CPAP use:  yes Sleep quality with CPAP use: good Treament compliance:excellent compliance Last sleep study: October 2025 Treatments attempted: CPAP Wakes feeling refreshed:  yes Daytime hypersomnolence:  yes Fatigue:  yes Insomnia:  no Good sleep hygiene:  yes Difficulty falling asleep:  no Difficulty staying asleep:  no Snoring bothers bed partner:  yes Observed apnea by bed partner: yes Obesity:  yes Hypertension: no  Pulmonary hypertension:  no Coronary artery disease:  no    Relevant past medical, surgical, family and social history reviewed and updated as indicated. Interim medical history since our last visit reviewed. Allergies and medications reviewed and updated.  Review of Systems  Constitutional: Negative.   Respiratory: Negative.    Cardiovascular: Negative.   Musculoskeletal:  Positive for back pain and myalgias. Negative for arthralgias, gait problem, joint swelling, neck pain and neck stiffness.  Skin: Negative.   Neurological: Negative.   Psychiatric/Behavioral: Negative.  Per HPI unless specifically indicated above     Objective:    BP 122/83   Pulse 70   Temp 98 F (36.7 C) (Oral)   Ht 5' 2.5 (1.588 m)   Wt 264 lb 12.8 oz (120.1 kg)   SpO2 97%   BMI 47.66 kg/m   Wt Readings from Last 3 Encounters:  02/09/24 264 lb 12.8 oz (120.1 kg)  01/03/24 264 lb 4.8 oz (119.9 kg)  12/09/23 261 lb 12.8 oz (118.8 kg)    Physical Exam Vitals and nursing note reviewed.  Constitutional:      General: She is not in acute distress.    Appearance:  Normal appearance. She is obese. She is not ill-appearing, toxic-appearing or diaphoretic.  HENT:     Head: Normocephalic and atraumatic.     Right Ear: External ear normal.     Left Ear: External ear normal.     Nose: Nose normal.     Mouth/Throat:     Mouth: Mucous membranes are moist.     Pharynx: Oropharynx is clear.  Eyes:     General: No scleral icterus.       Right eye: No discharge.        Left eye: No discharge.     Extraocular Movements: Extraocular movements intact.     Conjunctiva/sclera: Conjunctivae normal.     Pupils: Pupils are equal, round, and reactive to light.  Cardiovascular:     Rate and Rhythm: Normal rate and regular rhythm.     Pulses: Normal pulses.     Heart sounds: Normal heart sounds. No murmur heard.    No friction rub. No gallop.  Pulmonary:     Effort: Pulmonary effort is normal. No respiratory distress.     Breath sounds: Normal breath sounds. No stridor. No wheezing, rhonchi or rales.  Chest:     Chest wall: No tenderness.  Musculoskeletal:        General: Normal range of motion.     Cervical back: Normal range of motion and neck supple.  Skin:    General: Skin is warm and dry.     Capillary Refill: Capillary refill takes less than 2 seconds.     Coloration: Skin is not jaundiced or pale.     Findings: No bruising, erythema, lesion or rash.  Neurological:     General: No focal deficit present.     Mental Status: She is alert and oriented to person, place, and time. Mental status is at baseline.  Psychiatric:        Mood and Affect: Mood normal.        Behavior: Behavior normal.        Thought Content: Thought content normal.        Judgment: Judgment normal.     Results for orders placed or performed in visit on 12/09/23  B Nat Peptide   Collection Time: 12/09/23  3:53 PM  Result Value Ref Range   BNP CANCELED pg/mL  CBC with Differential/Platelet   Collection Time: 12/09/23  3:53 PM  Result Value Ref Range   WBC 4.3 3.4 - 10.8  x10E3/uL   RBC 3.72 (L) 3.77 - 5.28 x10E6/uL   Hemoglobin 12.2 11.1 - 15.9 g/dL   Hematocrit 62.5 65.9 - 46.6 %   MCV 101 (H) 79 - 97 fL   MCH 32.8 26.6 - 33.0 pg   MCHC 32.6 31.5 - 35.7 g/dL   RDW 87.6 88.2 - 84.5 %   Platelets 225 150 -  450 x10E3/uL   Neutrophils 70 Not Estab. %   Lymphs 20 Not Estab. %   Monocytes 8 Not Estab. %   Eos 0 Not Estab. %   Basos 1 Not Estab. %   Neutrophils Absolute 3.1 1.4 - 7.0 x10E3/uL   Lymphocytes Absolute 0.9 0.7 - 3.1 x10E3/uL   Monocytes Absolute 0.4 0.1 - 0.9 x10E3/uL   EOS (ABSOLUTE) 0.0 0.0 - 0.4 x10E3/uL   Basophils Absolute 0.0 0.0 - 0.2 x10E3/uL   Immature Granulocytes 0 Not Estab. %   Immature Grans (Abs) 0.0 0.0 - 0.1 x10E3/uL  Comprehensive metabolic panel with GFR   Collection Time: 12/09/23  3:53 PM  Result Value Ref Range   Glucose 107 (H) 70 - 99 mg/dL   BUN 12 8 - 27 mg/dL   Creatinine, Ser 9.05 0.57 - 1.00 mg/dL   eGFR 69 >40 fO/fpw/8.26   BUN/Creatinine Ratio 13 12 - 28   Sodium 143 134 - 144 mmol/L   Potassium 3.7 3.5 - 5.2 mmol/L   Chloride 104 96 - 106 mmol/L   CO2 23 20 - 29 mmol/L   Calcium  8.8 8.7 - 10.3 mg/dL   Total Protein 6.6 6.0 - 8.5 g/dL   Albumin 4.1 3.9 - 4.9 g/dL   Globulin, Total 2.5 1.5 - 4.5 g/dL   Bilirubin Total 0.6 0.0 - 1.2 mg/dL   Alkaline Phosphatase 103 49 - 135 IU/L   AST 23 0 - 40 IU/L   ALT 15 0 - 32 IU/L  TSH   Collection Time: 12/09/23  3:53 PM  Result Value Ref Range   TSH 2.830 0.450 - 4.500 uIU/mL      Assessment & Plan:   Problem List Items Addressed This Visit       Respiratory   OSA on CPAP   Doing OK with CPAP. Would benefit from zepbound. Will start. Awaiting AHI before sending Rx in. Await paperwork.        Other   Depression   Not doing great on the long acting wellbutrin . Will change to short acting and recheck in about a month. May need to add additional medicine. Call with any concerns.       Relevant Medications   buPROPion  (WELLBUTRIN  SR) 150 MG 12 hr  tablet   Lupus (systemic lupus erythematosus) (HCC) - Primary   Continue to follow with rheumatology. Call with any concerns. Continue to monitor.       Morbid obesity (HCC)   +OSA Would benefit from zepbound. Will get her started on it. Call with any concerns. Awaiting AHI before sending Rx in. Await paperwork.      Other Visit Diagnoses       Right leg pain       Will check x-rays of back, knee and hip. Await results. Treat as needed.   Relevant Orders   DG Lumbar Spine Complete   DG Knee Complete 4 Views Right   DG Hip Unilat W OR W/O Pelvis 2-3 Views Right        Follow up plan: Return in about 4 weeks (around 03/08/2024).

## 2024-02-09 NOTE — Assessment & Plan Note (Signed)
Continue to follow with rheumatology. Call with any concerns. Continue to monitor.  

## 2024-02-09 NOTE — Telephone Encounter (Signed)
 Pt seen today

## 2024-02-09 NOTE — Assessment & Plan Note (Addendum)
+  OSA Would benefit from zepbound. Will get her started on it. Call with any concerns. Awaiting AHI before sending Rx in. Await paperwork.

## 2024-03-09 ENCOUNTER — Other Ambulatory Visit: Payer: Self-pay | Admitting: Family Medicine

## 2024-03-09 NOTE — Telephone Encounter (Unsigned)
 Copied from CRM [id number]. Topic: Clinical - Medication Refill >> Mar 09, 2024  4:24 PM Amy B wrote: Medication:  cholestyramine  (QUESTRAN ) 4 g packet  Has the patient contacted their pharmacy? Yes (Agent: If no, request that the patient contact the pharmacy for the refill. If patient does not wish to contact the pharmacy document the reason why and proceed with request.) (Agent: If yes, when and what did the pharmacy advise?)  This is the patient's preferred pharmacy:   [facility] - [place], [place] - [address] [place] [address] Phone: [phone] Fax: [phone]  Is this the correct pharmacy for this prescription? Yes If no, delete pharmacy and type the correct one.   Has the prescription been filled recently? No  Is the patient out of the medication? No  Has the patient been seen for an appointment in the last year OR does the patient have an upcoming appointment? Yes  Can we respond through MyChart? Yes  Agent: Please be advised that Rx refills may take up to 3 business days. We ask that you follow-up with your pharmacy.

## 2024-03-12 NOTE — Telephone Encounter (Signed)
 Refilled 02/09/24 # 270 with 3 refills.

## 2024-03-14 ENCOUNTER — Other Ambulatory Visit: Payer: Self-pay | Admitting: Family Medicine

## 2024-03-22 ENCOUNTER — Other Ambulatory Visit: Payer: Self-pay | Admitting: Family Medicine

## 2024-03-22 NOTE — Telephone Encounter (Signed)
 Copied from CRM [id number]. Topic: Clinical - Medication Refill >> Mar 22, 2024  3:13 PM Darshell M wrote: Medication:  venlafaxine  XR (EFFEXOR -XR) 150 MG 24 hr capsule  gabapentin  (NEURONTIN ) 300 MG capsule  apixaban  (ELIQUIS ) 5 MG TABS tablet   Has the patient contacted their pharmacy? Yes (Agent: If no, request that the patient contact the pharmacy for the refill. If patient does not wish to contact the pharmacy document the reason why and proceed with request.) (Agent: If yes, when and what did the pharmacy advise?)  This is the patient's preferred pharmacy:   [facility] - [place], [place] - [address] [place] [address] Phone: [phone] Fax: [phone]  Is this the correct pharmacy for this prescription? Yes If no, delete pharmacy and type the correct one.   Has the prescription been filled recently? No  Is the patient out of the medication? Yes  Has the patient been seen for an appointment in the last year OR does the patient have an upcoming appointment? Yes  Can we respond through MyChart? No  Agent: Please be advised that Rx refills may take up to 3 business days. We ask that you follow-up with your pharmacy.

## 2024-03-23 NOTE — Telephone Encounter (Signed)
 Duplicate request, refilled 03/09/24.  Requested Prescriptions  Pending Prescriptions Disp Refills   venlafaxine  XR (EFFEXOR -XR) 150 MG 24 hr capsule 90 capsule 0    Sig: TAKE ONE CAPSULE BY MOUTH DAILY AT 9 AM WITH BREAKFAST WITH 75MG  FOR 225MG  DAILY     Psychiatry: Antidepressants - SNRI - desvenlafaxine & venlafaxine  Failed - 03/23/2024 11:47 AM      Failed - Lipid Panel in normal range within the last 12 months    Cholesterol, Total  Date Value Ref Range Status  09/22/2023 152 100 - 199 mg/dL Final   LDL Chol Calc (NIH)  Date Value Ref Range Status  09/22/2023 97 0 - 99 mg/dL Final   HDL  Date Value Ref Range Status  09/22/2023 42 >39 mg/dL Final   Triglycerides  Date Value Ref Range Status  09/22/2023 67 0 - 149 mg/dL Final         Passed - Cr in normal range and within 360 days    Creatinine, Ser  Date Value Ref Range Status  12/09/2023 0.94 0.57 - 1.00 mg/dL Final         Passed - Completed PHQ-2 or PHQ-9 in the last 360 days      Passed - Last BP in normal range    BP Readings from Last 1 Encounters:  02/09/24 122/83         Passed - Valid encounter within last 6 months    Recent Outpatient Visits           1 month ago Systemic lupus erythematosus, unspecified SLE type, unspecified organ involvement status (HCC)   [place] [facility] [place], Megan P, DO   2 months ago Atypical pneumonia   [place] [facility] Herold Hadassah SQUIBB, MD   3 months ago OSA on CPAP   [place] [facility] [place], Megan P, DO   5 months ago Acute non-recurrent maxillary sinusitis   [place] [facility] [place], [place] P, DO   6 months ago Moderate episode of recurrent major depressive disorder [facility])   [place] [facility] [place], Megan P, DO               gabapentin  (NEURONTIN ) 300 MG capsule 360 capsule 0     Neurology: Anticonvulsants - gabapentin  Passed - 03/23/2024 11:47 AM      Passed - Cr  in normal range and within 360 days    Creatinine, Ser  Date Value Ref Range Status  12/09/2023 0.94 0.57 - 1.00 mg/dL Final         Passed - Completed PHQ-2 or PHQ-9 in the last 360 days      Passed - Valid encounter within last 12 months    Recent Outpatient Visits           1 month ago Systemic lupus erythematosus, unspecified SLE type, unspecified organ involvement status [facility])   [place] [facility] [place], Megan P, DO   2 months ago Atypical pneumonia   [place] [facility] Herold Hadassah SQUIBB, MD   3 months ago OSA on CPAP   [place] [facility] [place], [place] P, DO   5 months ago Acute non-recurrent maxillary sinusitis   [place] [facility] [place], [place] P, DO   6 months ago Moderate episode of recurrent major depressive disorder [facility])   [place] [facility] [place], Megan P, DO  apixaban  (ELIQUIS ) 5 MG TABS tablet 180 tablet 0     Hematology:  Anticoagulants - apixaban  Passed - 03/23/2024 11:47 AM      Passed - PLT in normal range and within 360 days    Platelets  Date Value Ref Range Status  12/09/2023 225 150 - 450 x10E3/uL Final         Passed - HGB in normal range and within 360 days    Hemoglobin  Date Value Ref Range Status  12/09/2023 12.2 11.1 - 15.9 g/dL Final         Passed - HCT in normal range and within 360 days    Hematocrit  Date Value Ref Range Status  12/09/2023 37.4 34.0 - 46.6 % Final         Passed - Cr in normal range and within 360 days    Creatinine, Ser  Date Value Ref Range Status  12/09/2023 0.94 0.57 - 1.00 mg/dL Final         Passed - AST in normal range and within 360 days    AST  Date Value Ref Range Status  12/09/2023 23 0 - 40 IU/L Final         Passed - ALT in normal range and within 360 days    ALT  Date Value Ref Range Status  12/09/2023 15 0 - 32 IU/L Final         Passed - Valid encounter within last 12 months     Recent Outpatient Visits           1 month ago Systemic lupus erythematosus, unspecified SLE type, unspecified organ involvement status (HCC)   [place] [facility] [place], Megan P, DO   2 months ago Atypical pneumonia   [place] [facility] Herold Hadassah SQUIBB, MD   3 months ago OSA on CPAP   [place] [facility] [place], [place] P, DO   5 months ago Acute non-recurrent maxillary sinusitis   [place] [facility] [place], [place] P, DO   6 months ago Moderate episode of recurrent major depressive disorder [facility])   [place] [facility] [place], Megan P, DO

## 2024-04-09 ENCOUNTER — Other Ambulatory Visit: Payer: Self-pay | Admitting: Family Medicine

## 2024-04-10 ENCOUNTER — Ambulatory Visit: Payer: Self-pay

## 2024-04-10 NOTE — Telephone Encounter (Signed)
 FYI Only or Action Required?: FYI only for provider: appointment scheduled on 04/11/24.  Patient was last seen in primary care on 02/09/2024 by Vicci Duwaine SQUIBB, DO.  Called Nurse Triage reporting Knee Injury.  Symptoms began yesterday.  Interventions attempted: OTC medications: Tylenol .  Symptoms are: unchanged.  Triage Disposition: See Physician Within 24 Hours  Patient/caregiver understands and will follow disposition?: Yes  Message from Montie POUR sent at 04/10/2024 11:19 AM EST  Reason for Triage: She has fell twice at work on concrete. She has pain in the right knee and shoulder. Last night pain was a 10. She fell last week and yesterday.   Reason for Disposition  [1] MODERATE pain (e.g., interferes with normal activities, limping) AND [2] high-risk adult (e.g., age > 60 years, osteoporosis, chronic steroid use)  Answer Assessment - Initial Assessment Questions 1. MECHANISM: How did the injury happen? (e.g., twisting injury, direct blow)      Fell on R side  2. ONSET: When did the injury happen? (e.g., minutes, hours ago)      Yesterday and last week 3. LOCATION: Where is the injury located?      R knee  4. APPEARANCE of INJURY: What does the injury look like?      Fell x 2 at work  5. SEVERITY: Can you put weight on that leg? Can you walk?      Yes but painful  6. SIZE: For cuts, bruises, or swelling, ask: How large is it? (e.g., inches or centimeters;  entire joint)      Bruising to R knee  7. PAIN: Is there pain? If Yes, ask: How bad is the pain?   What does it keep you from doing? (Scale 0-10; or none, mild, moderate, severe)     5/10 9. OTHER SYMPTOMS: Do you have any other symptoms?  (e.g., pop when knee injured, swelling, locking, buckling)      R shoulder pain as well  Protocols used: Knee Injury-A-AH

## 2024-04-11 ENCOUNTER — Ambulatory Visit: Admitting: Family Medicine

## 2024-04-11 ENCOUNTER — Encounter: Payer: Self-pay | Admitting: Family Medicine

## 2024-04-11 VITALS — BP 103/73 | HR 69 | Temp 97.3°F | Resp 18 | Ht 62.52 in | Wt 256.6 lb

## 2024-04-11 DIAGNOSIS — G4733 Obstructive sleep apnea (adult) (pediatric): Secondary | ICD-10-CM

## 2024-04-11 DIAGNOSIS — R296 Repeated falls: Secondary | ICD-10-CM

## 2024-04-11 DIAGNOSIS — Z1211 Encounter for screening for malignant neoplasm of colon: Secondary | ICD-10-CM

## 2024-04-11 DIAGNOSIS — Z1231 Encounter for screening mammogram for malignant neoplasm of breast: Secondary | ICD-10-CM

## 2024-04-11 DIAGNOSIS — M25561 Pain in right knee: Secondary | ICD-10-CM

## 2024-04-11 MED ORDER — PREDNISONE 10 MG PO TABS: ORAL_TABLET | ORAL | 0 refills | Status: AC

## 2024-04-11 MED ORDER — METHYLPREDNISOLONE SODIUM SUCC 40 MG IJ SOLR
40.0000 mg | Freq: Once | INTRAMUSCULAR | Status: AC
  Administered 2024-04-11: 40 mg via INTRAMUSCULAR

## 2024-04-11 NOTE — Progress Notes (Unsigned)
 "  BP 103/73 (BP Location: Left Arm, Patient Position: Sitting, Cuff Size: Normal)   Pulse 69   Temp (!) 97.3 F (36.3 C) (Oral)   Resp 18   Ht 5' 2.52 (1.588 m)   Wt 256 lb 9.6 oz (116.4 kg)   SpO2 97%   BMI 46.16 kg/m    Subjective:    Patient ID: Kaylee Fox, female    DOB: Nov 13, 1960, 64 y.o.   MRN: [id number]  HPI: Kaylee Fox is a 64 y.o. female  Chief Complaint  Patient presents with   Knee Pain    Right knee pain that goes all the way to the back. Fell on concrete Tuesday.    Fatigue    Feeling tired every day, light headed as well states it started when the flu started   [place] on Tuesday, unsure how she fell, but tripped and thinks her foot didn't go with her other foot she has been able to walk on it.   SLEEP APNEA Sleep apnea status: uncontrolled Duration: chronic Satisfied with current treatment?:  no CPAP use:  N/A Last sleep study: October 2025 Treatments attempted: none Wakes feeling refreshed:  no Daytime hypersomnolence:  yes Fatigue:  yes Insomnia:  no Good sleep hygiene:  yes Difficulty falling asleep:  yes Difficulty staying asleep:  yes Snoring bothers bed partner:  yes Observed apnea by bed partner: yes Obesity:  yes Hypertension: yes  Pulmonary hypertension:  no Coronary artery disease:  no    Relevant past medical, surgical, family and social history reviewed and updated as indicated. Interim medical history since our last visit reviewed. Allergies and medications reviewed and updated.  Review of Systems  Constitutional:  Positive for fatigue. Negative for activity change, appetite change, chills, diaphoresis, fever and unexpected weight change.  Respiratory: Negative.    Cardiovascular: Negative.   Musculoskeletal: Negative.   Neurological: Negative.     Per HPI unless specifically indicated above     Objective:    BP 103/73 (BP Location: Left Arm, Patient Position: Sitting, Cuff Size: Normal)   Pulse 69   Temp  (!) 97.3 F (36.3 C) (Oral)   Resp 18   Ht 5' 2.52 (1.588 m)   Wt 256 lb 9.6 oz (116.4 kg)   SpO2 97%   BMI 46.16 kg/m   Wt Readings from Last 3 Encounters:  04/11/24 256 lb 9.6 oz (116.4 kg)  02/09/24 264 lb 12.8 oz (120.1 kg)  01/03/24 264 lb 4.8 oz (119.9 kg)    Physical Exam Vitals and nursing note reviewed.  Constitutional:      General: She is not in acute distress.    Appearance: Normal appearance. She is obese. She is not ill-appearing, toxic-appearing or diaphoretic.  HENT:     Head: Normocephalic and atraumatic.     Right Ear: External ear normal.     Left Ear: External ear normal.     Nose: Nose normal.     Mouth/Throat:     Mouth: Mucous membranes are moist.     Pharynx: Oropharynx is clear.  Eyes:     General: No scleral icterus.       Right eye: No discharge.        Left eye: No discharge.     Extraocular Movements: Extraocular movements intact.     Conjunctiva/sclera: Conjunctivae normal.     Pupils: Pupils are equal, round, and reactive to light.  Cardiovascular:     Rate and Rhythm: Normal rate and regular rhythm.  Pulses: Normal pulses.     Heart sounds: Normal heart sounds. No murmur heard.    No friction rub. No gallop.  Pulmonary:     Effort: Pulmonary effort is normal. No respiratory distress.     Breath sounds: Normal breath sounds. No stridor. No wheezing, rhonchi or rales.  Chest:     Chest wall: No tenderness.  Musculoskeletal:        General: Normal range of motion.     Cervical back: Normal range of motion and neck supple.  Skin:    General: Skin is warm and dry.     Capillary Refill: Capillary refill takes less than 2 seconds.     Coloration: Skin is not jaundiced or pale.     Findings: No bruising, erythema, lesion or rash.  Neurological:     General: No focal deficit present.     Mental Status: She is alert and oriented to person, place, and time. Mental status is at baseline.  Psychiatric:        Mood and Affect: Mood normal.         Behavior: Behavior normal.        Thought Content: Thought content normal.        Judgment: Judgment normal.     Results for orders placed or performed in visit on 12/09/23  B Nat Peptide   Collection Time: 12/09/23  3:53 PM  Result Value Ref Range   BNP CANCELED pg/mL  CBC with Differential/Platelet   Collection Time: 12/09/23  3:53 PM  Result Value Ref Range   WBC 4.3 3.4 - 10.8 x10E3/uL   RBC 3.72 (L) 3.77 - 5.28 x10E6/uL   Hemoglobin 12.2 11.1 - 15.9 g/dL   Hematocrit 62.5 65.9 - 46.6 %   MCV 101 (H) 79 - 97 fL   MCH 32.8 26.6 - 33.0 pg   MCHC 32.6 31.5 - 35.7 g/dL   RDW 87.6 88.2 - 84.5 %   Platelets 225 150 - 450 x10E3/uL   Neutrophils 70 Not Estab. %   Lymphs 20 Not Estab. %   Monocytes 8 Not Estab. %   Eos 0 Not Estab. %   Basos 1 Not Estab. %   Neutrophils Absolute 3.1 1.4 - 7.0 x10E3/uL   Lymphocytes Absolute 0.9 0.7 - 3.1 x10E3/uL   Monocytes Absolute 0.4 0.1 - 0.9 x10E3/uL   EOS (ABSOLUTE) 0.0 0.0 - 0.4 x10E3/uL   Basophils Absolute 0.0 0.0 - 0.2 x10E3/uL   Immature Granulocytes 0 Not Estab. %   Immature Grans (Abs) 0.0 0.0 - 0.1 x10E3/uL  Comprehensive metabolic panel with GFR   Collection Time: 12/09/23  3:53 PM  Result Value Ref Range   Glucose 107 (H) 70 - 99 mg/dL   BUN 12 8 - 27 mg/dL   Creatinine, Ser 9.05 0.57 - 1.00 mg/dL   eGFR 69 >40 fO/fpw/8.26   BUN/Creatinine Ratio 13 12 - 28   Sodium 143 134 - 144 mmol/L   Potassium 3.7 3.5 - 5.2 mmol/L   Chloride 104 96 - 106 mmol/L   CO2 23 20 - 29 mmol/L   Calcium  8.8 8.7 - 10.3 mg/dL   Total Protein 6.6 6.0 - 8.5 g/dL   Albumin 4.1 3.9 - 4.9 g/dL   Globulin, Total 2.5 1.5 - 4.5 g/dL   Bilirubin Total 0.6 0.0 - 1.2 mg/dL   Alkaline Phosphatase 103 49 - 135 IU/L   AST 23 0 - 40 IU/L   ALT 15 0 - 32 IU/L  TSH   Collection Time: 12/09/23  3:53 PM  Result Value Ref Range   TSH 2.830 0.450 - 4.500 uIU/mL      Assessment & Plan:   Problem List Items Addressed This Visit       Respiratory    OSA on CPAP   She said she was not using her CPAP. AHI 1.4- not a candidate for zepbound. Will check on CPAP status. Call with any concerns.       Other Visit Diagnoses       Acute pain of right knee    -  Primary   Will check x-ray and treat with IM steroid injection. Call with any concerns or if not getting better.   Relevant Medications   methylPREDNISolone  sodium succinate (SOLU-MEDROL ) 40 mg/mL injection 40 mg (Completed)   Other Relevant Orders   DG Knee Complete 4 Views Right     Recurrent falls       Balance has not been doing well. Will get her set up with PT. Call with any concerns.   Relevant Orders   Ambulatory referral to Physical Therapy     Screening for colon cancer       Referral to GI placed today.   Relevant Orders   Ambulatory referral to Gastroenterology     Encounter for screening mammogram for malignant neoplasm of breast       Mammogram ordered today.   Relevant Orders   MM 3D SCREENING MAMMOGRAM BILATERAL BREAST        Follow up plan: Return in about 6 weeks (around 05/23/2024) for physical.      "

## 2024-04-12 ENCOUNTER — Ambulatory Visit
Admission: RE | Admit: 2024-04-12 | Discharge: 2024-04-12 | Disposition: A | Source: Ambulatory Visit | Attending: Family Medicine

## 2024-04-12 DIAGNOSIS — M25561 Pain in right knee: Secondary | ICD-10-CM

## 2024-04-12 NOTE — Assessment & Plan Note (Signed)
 She said she was not using her CPAP. AHI 1.4- not a candidate for zepbound. Will check on CPAP status. Call with any concerns.

## 2024-05-10 ENCOUNTER — Ambulatory Visit: Admitting: Family Medicine

## 2024-06-07 ENCOUNTER — Encounter: Admitting: Family Medicine
